# Patient Record
Sex: Female | Born: 1985
Health system: Southern US, Community
[De-identification: ages and names within clinical notes are randomized; demographics above are authoritative.]

## PROBLEM LIST (undated history)

## (undated) ENCOUNTER — Emergency Department (HOSPITAL_COMMUNITY): Admission: EM | Payer: Medicare Other

## (undated) DIAGNOSIS — B9689 Other specified bacterial agents as the cause of diseases classified elsewhere: Secondary | ICD-10-CM

## (undated) DIAGNOSIS — Z0282 Encounter for adoption services: Secondary | ICD-10-CM

## (undated) DIAGNOSIS — N760 Acute vaginitis: Secondary | ICD-10-CM

## (undated) DIAGNOSIS — F329 Major depressive disorder, single episode, unspecified: Secondary | ICD-10-CM

## (undated) DIAGNOSIS — F32A Depression, unspecified: Secondary | ICD-10-CM

## (undated) DIAGNOSIS — Z789 Other specified health status: Secondary | ICD-10-CM

## (undated) DIAGNOSIS — E669 Obesity, unspecified: Secondary | ICD-10-CM

## (undated) DIAGNOSIS — A4902 Methicillin resistant Staphylococcus aureus infection, unspecified site: Secondary | ICD-10-CM

## (undated) DIAGNOSIS — N390 Urinary tract infection, site not specified: Secondary | ICD-10-CM

## (undated) DIAGNOSIS — A749 Chlamydial infection, unspecified: Secondary | ICD-10-CM

---
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
## 2005-04-01 ENCOUNTER — Inpatient Hospital Stay (HOSPITAL_COMMUNITY): Admission: AD | Admit: 2005-04-01 | Discharge: 2005-04-04 | Payer: Self-pay | Admitting: Obstetrics

## 2005-12-23 ENCOUNTER — Emergency Department (HOSPITAL_COMMUNITY): Admission: EM | Admit: 2005-12-23 | Discharge: 2005-12-23 | Payer: Self-pay | Admitting: Emergency Medicine

## 2006-08-10 ENCOUNTER — Ambulatory Visit: Payer: Self-pay | Admitting: Internal Medicine

## 2006-08-10 ENCOUNTER — Inpatient Hospital Stay (HOSPITAL_COMMUNITY): Admission: EM | Admit: 2006-08-10 | Discharge: 2006-08-14 | Payer: Self-pay | Admitting: Emergency Medicine

## 2006-08-17 ENCOUNTER — Ambulatory Visit: Payer: Self-pay | Admitting: *Deleted

## 2006-08-24 ENCOUNTER — Emergency Department (HOSPITAL_COMMUNITY): Admission: EM | Admit: 2006-08-24 | Discharge: 2006-08-25 | Payer: Self-pay | Admitting: Emergency Medicine

## 2006-08-27 ENCOUNTER — Encounter (INDEPENDENT_AMBULATORY_CARE_PROVIDER_SITE_OTHER): Payer: Self-pay | Admitting: *Deleted

## 2006-12-05 ENCOUNTER — Ambulatory Visit: Payer: Self-pay | Admitting: Family Medicine

## 2006-12-06 ENCOUNTER — Ambulatory Visit: Payer: Self-pay | Admitting: Family Medicine

## 2007-06-05 ENCOUNTER — Emergency Department (HOSPITAL_COMMUNITY): Admission: EM | Admit: 2007-06-05 | Discharge: 2007-06-05 | Payer: Self-pay | Admitting: Emergency Medicine

## 2007-06-19 ENCOUNTER — Emergency Department (HOSPITAL_COMMUNITY): Admission: EM | Admit: 2007-06-19 | Discharge: 2007-06-19 | Payer: Self-pay | Admitting: Emergency Medicine

## 2007-11-04 ENCOUNTER — Emergency Department (HOSPITAL_COMMUNITY): Admission: EM | Admit: 2007-11-04 | Discharge: 2007-11-04 | Payer: Self-pay | Admitting: Emergency Medicine

## 2008-01-04 ENCOUNTER — Emergency Department (HOSPITAL_COMMUNITY): Admission: EM | Admit: 2008-01-04 | Discharge: 2008-01-04 | Payer: Self-pay | Admitting: Emergency Medicine

## 2008-05-13 ENCOUNTER — Emergency Department (HOSPITAL_COMMUNITY): Admission: EM | Admit: 2008-05-13 | Discharge: 2008-05-13 | Payer: Self-pay | Admitting: Emergency Medicine

## 2008-10-29 ENCOUNTER — Emergency Department (HOSPITAL_COMMUNITY): Admission: EM | Admit: 2008-10-29 | Discharge: 2008-10-29 | Payer: Self-pay | Admitting: Emergency Medicine

## 2009-06-19 ENCOUNTER — Emergency Department (HOSPITAL_COMMUNITY): Admission: EM | Admit: 2009-06-19 | Discharge: 2009-06-19 | Payer: Self-pay | Admitting: Emergency Medicine

## 2009-08-01 ENCOUNTER — Inpatient Hospital Stay (HOSPITAL_COMMUNITY): Admission: AD | Admit: 2009-08-01 | Discharge: 2009-08-02 | Payer: Self-pay | Admitting: Family Medicine

## 2009-08-01 ENCOUNTER — Ambulatory Visit: Payer: Self-pay | Admitting: Nurse Practitioner

## 2009-08-03 ENCOUNTER — Emergency Department (HOSPITAL_COMMUNITY): Admission: EM | Admit: 2009-08-03 | Discharge: 2009-08-04 | Payer: Self-pay | Admitting: Emergency Medicine

## 2009-10-15 ENCOUNTER — Inpatient Hospital Stay (HOSPITAL_COMMUNITY): Admission: AD | Admit: 2009-10-15 | Discharge: 2009-10-15 | Payer: Self-pay | Admitting: Obstetrics

## 2009-10-26 ENCOUNTER — Ambulatory Visit (HOSPITAL_COMMUNITY): Admission: RE | Admit: 2009-10-26 | Discharge: 2009-10-26 | Payer: Self-pay | Admitting: Obstetrics

## 2009-12-14 ENCOUNTER — Ambulatory Visit (HOSPITAL_COMMUNITY): Admission: RE | Admit: 2009-12-14 | Discharge: 2009-12-14 | Payer: Self-pay | Admitting: Obstetrics

## 2010-01-14 ENCOUNTER — Inpatient Hospital Stay (HOSPITAL_COMMUNITY)
Admission: AD | Admit: 2010-01-14 | Discharge: 2010-02-12 | Payer: Self-pay | Source: Home / Self Care | Attending: Obstetrics | Admitting: Obstetrics

## 2010-01-17 ENCOUNTER — Ambulatory Visit (HOSPITAL_COMMUNITY)
Admission: RE | Admit: 2010-01-17 | Discharge: 2010-01-17 | Payer: Self-pay | Source: Home / Self Care | Attending: Obstetrics | Admitting: Obstetrics

## 2010-01-26 ENCOUNTER — Ambulatory Visit (HOSPITAL_COMMUNITY): Admission: RE | Admit: 2010-01-26 | Payer: Self-pay | Source: Home / Self Care | Admitting: Obstetrics

## 2010-02-09 ENCOUNTER — Encounter: Payer: Self-pay | Admitting: Obstetrics

## 2010-02-14 LAB — CBC
HCT: 32.3 % — ABNORMAL LOW (ref 36.0–46.0)
HCT: 26.8 % — ABNORMAL LOW (ref 36.0–46.0)
Hemoglobin: 10.8 g/dL — ABNORMAL LOW (ref 12.0–15.0)
Hemoglobin: 9 g/dL — ABNORMAL LOW (ref 12.0–15.0)
MCH: 30 pg (ref 26.0–34.0)
MCH: 29.8 pg (ref 26.0–34.0)
MCHC: 33.4 g/dL (ref 30.0–36.0)
MCHC: 33.6 g/dL (ref 30.0–36.0)
MCV: 89.7 fL (ref 78.0–100.0)
MCV: 88.7 fL (ref 78.0–100.0)
Platelets: 175 10*3/uL (ref 150–400)
Platelets: 120 10*3/uL — ABNORMAL LOW (ref 150–400)
RBC: 3.6 MIL/uL — ABNORMAL LOW (ref 3.87–5.11)
RBC: 3.02 MIL/uL — ABNORMAL LOW (ref 3.87–5.11)
RDW: 15.1 % (ref 11.5–15.5)
RDW: 14.9 % (ref 11.5–15.5)
WBC: 5.9 10*3/uL (ref 4.0–10.5)
WBC: 7.5 10*3/uL (ref 4.0–10.5)

## 2010-02-14 LAB — LSPG (L/S RATIO WITH PG)-AMNIO FLUID

## 2010-02-20 ENCOUNTER — Encounter: Payer: Self-pay | Admitting: Obstetrics

## 2010-03-01 NOTE — Letter (Signed)
Summary: Letter:Court  Letter:Court   Imported By: Florinda Marker 08/30/2006 10:43:16  _____________________________________________________________________  External Attachment:    Type:   Image     Comment:   External Document

## 2010-04-11 LAB — CBC
HCT: 30.1 % — ABNORMAL LOW (ref 36.0–46.0)
HCT: 30.7 % — ABNORMAL LOW (ref 36.0–46.0)
HCT: 31.5 % — ABNORMAL LOW (ref 36.0–46.0)
Hemoglobin: 10.2 g/dL — ABNORMAL LOW (ref 12.0–15.0)
Hemoglobin: 10.2 g/dL — ABNORMAL LOW (ref 12.0–15.0)
Hemoglobin: 10.3 g/dL — ABNORMAL LOW (ref 12.0–15.0)
MCH: 29.7 pg (ref 26.0–34.0)
MCH: 30 pg (ref 26.0–34.0)
MCH: 30.1 pg (ref 26.0–34.0)
MCHC: 32.7 g/dL (ref 30.0–36.0)
MCHC: 33.2 g/dL (ref 30.0–36.0)
MCHC: 33.9 g/dL (ref 30.0–36.0)
MCV: 88.8 fL (ref 78.0–100.0)
MCV: 90.3 fL (ref 78.0–100.0)
MCV: 90.8 fL (ref 78.0–100.0)
Platelets: 136 10*3/uL — ABNORMAL LOW (ref 150–400)
Platelets: 138 10*3/uL — ABNORMAL LOW (ref 150–400)
Platelets: 177 10*3/uL (ref 150–400)
RBC: 3.39 MIL/uL — ABNORMAL LOW (ref 3.87–5.11)
RBC: 3.4 MIL/uL — ABNORMAL LOW (ref 3.87–5.11)
RBC: 3.47 MIL/uL — ABNORMAL LOW (ref 3.87–5.11)
RDW: 15.1 % (ref 11.5–15.5)
RDW: 15.9 % — ABNORMAL HIGH (ref 11.5–15.5)
RDW: 16.1 % — ABNORMAL HIGH (ref 11.5–15.5)
WBC: 8.1 10*3/uL (ref 4.0–10.5)
WBC: 8.1 10*3/uL (ref 4.0–10.5)
WBC: 9 10*3/uL (ref 4.0–10.5)

## 2010-04-11 LAB — TYPE AND SCREEN
ABO/RH(D): A POS
ABO/RH(D): A POS
ABO/RH(D): A POS
Antibody Screen: NEGATIVE
Antibody Screen: NEGATIVE
Antibody Screen: NEGATIVE

## 2010-04-11 LAB — GC/CHLAMYDIA PROBE AMP, URINE
Chlamydia, Swab/Urine, PCR: NEGATIVE
GC Probe Amp, Urine: NEGATIVE

## 2010-04-11 LAB — DIFFERENTIAL
Basophils Absolute: 0 10*3/uL (ref 0.0–0.1)
Basophils Relative: 0 % (ref 0–1)
Eosinophils Absolute: 0.1 10*3/uL (ref 0.0–0.7)
Eosinophils Relative: 1 % (ref 0–5)
Lymphocytes Relative: 16 % (ref 12–46)
Lymphs Abs: 1.4 10*3/uL (ref 0.7–4.0)
Monocytes Absolute: 0.4 10*3/uL (ref 0.1–1.0)
Monocytes Relative: 5 % (ref 3–12)
Neutro Abs: 7 10*3/uL (ref 1.7–7.7)
Neutrophils Relative %: 78 % — ABNORMAL HIGH (ref 43–77)

## 2010-04-11 LAB — DIC (DISSEMINATED INTRAVASCULAR COAGULATION)PANEL
D-Dimer, Quant: 1.12 ug/mL-FEU — ABNORMAL HIGH (ref 0.00–0.48)
Fibrinogen: 571 mg/dL — ABNORMAL HIGH (ref 204–475)
INR: 1.43 (ref 0.00–1.49)
Platelets: 138 10*3/uL — ABNORMAL LOW (ref 150–400)
Prothrombin Time: 17.6 seconds — ABNORMAL HIGH (ref 11.6–15.2)
Smear Review: NONE SEEN
aPTT: 29 seconds (ref 24–37)

## 2010-04-11 LAB — URINE CULTURE
Colony Count: 8000
Culture  Setup Time: 201112170343

## 2010-04-11 LAB — RPR: RPR Ser Ql: NONREACTIVE

## 2010-04-11 LAB — LSPG (L/S RATIO WITH PG)-AMNIO FLUID

## 2010-04-14 LAB — WET PREP, GENITAL
Clue Cells Wet Prep HPF POC: NONE SEEN
Yeast Wet Prep HPF POC: NONE SEEN

## 2010-04-14 LAB — GC/CHLAMYDIA PROBE AMP, GENITAL
Chlamydia, DNA Probe: NEGATIVE
GC Probe Amp, Genital: NEGATIVE

## 2010-04-17 LAB — URINALYSIS, ROUTINE W REFLEX MICROSCOPIC
Bilirubin Urine: NEGATIVE
Glucose, UA: NEGATIVE mg/dL
Ketones, ur: NEGATIVE mg/dL
Nitrite: NEGATIVE
Protein, ur: NEGATIVE mg/dL
Specific Gravity, Urine: 1.025 (ref 1.005–1.030)
Urobilinogen, UA: 1 mg/dL (ref 0.0–1.0)
pH: 6 (ref 5.0–8.0)

## 2010-04-17 LAB — HCG, QUANTITATIVE, PREGNANCY: hCG, Beta Chain, Quant, S: 5868 m[IU]/mL — ABNORMAL HIGH (ref <5)

## 2010-04-17 LAB — CBC
HCT: 28.9 % — ABNORMAL LOW (ref 36.0–46.0)
Hemoglobin: 9.2 g/dL — ABNORMAL LOW (ref 12.0–15.0)
MCH: 25.1 pg — ABNORMAL LOW (ref 26.0–34.0)
MCHC: 31.9 g/dL (ref 30.0–36.0)
MCV: 78.9 fL (ref 78.0–100.0)
Platelets: 299 10*3/uL (ref 150–400)
RBC: 3.66 MIL/uL — ABNORMAL LOW (ref 3.87–5.11)
RDW: 19.5 % — ABNORMAL HIGH (ref 11.5–15.5)
WBC: 6.2 10*3/uL (ref 4.0–10.5)

## 2010-04-17 LAB — URINE MICROSCOPIC-ADD ON

## 2010-04-17 LAB — GC/CHLAMYDIA PROBE AMP, GENITAL
Chlamydia, DNA Probe: NEGATIVE
GC Probe Amp, Genital: NEGATIVE

## 2010-04-17 LAB — WET PREP, GENITAL
Trich, Wet Prep: NONE SEEN
Yeast Wet Prep HPF POC: NONE SEEN

## 2010-04-17 LAB — ABO/RH: ABO/RH(D): A POS

## 2010-05-06 LAB — RPR: RPR Ser Ql: NONREACTIVE

## 2010-06-14 NOTE — Discharge Summary (Signed)
NAMEESTELLAR, CADENA             ACCOUNT NO.:  [id number]   MEDICAL RECORD NO.:  [id number]          PATIENT TYPE:  OBV   LOCATION:  5703                         FACILITY:  [facility]   PHYSICIAN:  Tacey Ruiz, MD    DATE OF BIRTH:  [birth year]   DATE OF ADMISSION:  08/09/2006  DATE OF DISCHARGE:  08/14/2006                               DISCHARGE SUMMARY   CHIEF COMPLAINT:  Asthma exacerbation.   DISCHARGE DIAGNOSES:  1. Asthma.  2. Hypokalemia.  3. Bipolar disorder.   DISCHARGE MEDICATIONS:  1. Advair 250/50, use twice q.d.  2. Celexa 10 mg, use daily at night.  3. Risperdal 0.5 mg daily at night.  4. Albuterol 0.09 mg two puffs every four hours as needed.  5. Prednisone 10 mg to take three pills July 15, take two pills July      16, take one pill July 17, then stop.   DISPOSITION/FOLLOW UP:  Patient will return to internal medicine clinic  here at [facility] to see Dr. Kerby Nora on July 29th at 3 PM.  During this appointment, we will follow-up patient's use of nebulizers  and make sure she has all her medications.  We will also deal with some  social issues including her current residence and living situation.  At  this time, we can consider scheduling Mrs. Lobo for an outpatient  pulmonary function test.  The patient will also schedule up with the  [facility], phone number [phone], July 22nd at 12:15 PM.  The  patient will follow-up here to deal with her bipolar disorder and they  will manage her Celexa and Risperdal, which were started during this  hospitalization.  Another option for follow-up includes the [facility] of the [place] so that patient can come to terms with the loss  of custody of her one-year-old son.   PROCEDURE:  None.   CULTURE:  None.   CONSULTING PHYSICIAN:  Psychiatry saw and consulted this patient due to  her history of mental illness, likely bipolar disorder.  Psychiatry felt  the patient would benefit from being started  on Celexa and Risperdal to  try to manage her presumed bipolar disorder.  They also recommended  follow-up at the [facility] and the [facility] of the [place].   HISTORY AND PHYSICAL:  This is a 25 year old African-American female  with a past medical history significant for asthma.  The patient uses  her albuterol and Advair inhalers at home.  The patient had been out of  these inhalers for four to five months.  The patient had no PCP so she  is not currently take these medications.  On the 4th of July, the  patient was in [place], [place], had some shortness of breath  and wheezing.  This persisted, but she tried some home remedies which  she thought would help.  These remedies include coffee.  One day prior  to admission, the patient experienced worsening wheezing and shortness  of breath and on the day of admission, the patient called EMS because  the shortness of breath had continued to worsen and  she was now severely  dyspneic and wheezy.  The patient denies fevers, chills or cough  productive of sputum.  The patient denies any aggravators in her place  of residence.  The patient has no history of intubations.  The patient  had been given prednisone and four albuterol nebulized treatments in the  ED without significant relief.   ALLERGIES:  NO KNOWN DRUG ALLERGIES.  THE PATIENT HAS ALLERGIES TO  PEANUTS AND SHELLFISH WHICH CAUSE HER THROAT TO SWELL AND HER TO BREAK  OUT IN HIVES.  THE SAME OCCURS WHEN THE PATIENT EATS CRAB LEGS.   PAST MEDICAL HISTORY:  Asthma since [age] months of age, managed with  albuterol and Advair.   MEDICATIONS:  None.   SOCIAL HISTORY:  The patient is a former smoker.  She smoked two years  at one to two cigarettes a day.  She drinks two to three alcoholic  drinks a week and occasionally uses marijuana.  She is single.  She does  not work.  She is on Medicaid and has Medicaid with her prescriptions.  The patient states she lives with  her mother, sister and son.   FAMILY HISTORY:  Mother is 78 years old, also has asthma and a history  of thyroid disease.  She has a father who is 35 with a history of  diabetes.  She has one brother, one sister, both have asthma; and one  child, a one-year-old son who has asthma.   REVIEW OF SYSTEMS:  Negative except as noted in HPI.   PHYSICAL EXAMINATION:  VITAL SIGNS:  Temperature of 98.9.  Blood  pressure 137/87.  Pulse of 91, but fluctuated up to 131.  Respiratory  rate ranged from 22 to 25.  The patient was satting 99% on room air.  GENERAL:  The patient was alert, in mild respiratory distress.  EYES:  Pupils are equal and evenly reactive to light.  Extraocular  movements are intact.  ENT:  Oropharynx clear.  Neck supple.  RESPIRATORY:  Poor air entry bilaterally.  Patient with expiratory  wheezes bilaterally, but no crackles.  CARDIOVASCULAR:  The patient was tachycardic with S1 and S2 and regular  rate and rhythm.  GI:  Bowel sounds are positive.  The patient was nontender, no  distention.  EXTREMITIES:  No edema.  SKIN:  Normal, no rashes.  NEUROLOGIC EXAM:  Cranial nerves II through XII are intact.  Motor,  sensory intact.  Cerebellar function intact, finger-to-nose.  PSYCH:  Patient appeared friendly, although somewhat disheveled and had  a normal affect.   LABORATORY DATA:  CBC:  White count 10.4, hemoglobin 12.9, platelets  305,000.  BMET:  Sodium 137, potassium 3.7, chloride 107, bicarb 19, BUN  7, creatinine 0.8, and glucose of 163.  Chest x-ray showed bronchitis,  but no consolidation.   HOSPITAL COURSE:  1. Asthma exacerbation:  The patient presented to the emergency      department in mild respiratory distress with bilateral wheezing.      The patient was given prednisone in the ED and albuterol nebulized      treatments and before transfer up to the floor, got some relief.      The patient was then switched over to Solu-Medrol IV steroids and      started  back on her Advair.  She was also continued on nebulized      treatments.  The next morning, with help from the treatments the      wheezing had  all but resolved.  The patient said she felt like she      was back to her normal respiratory state.  The patient was      restarted on her Advair b.i.d. and nebulizers were stopped.  The      patient was placed back on her albuterol q.4h. p.r.n.  Solu-Medrol      was stopped as well and the patient was started on a 60-mg steroid      oral prednisone taper.  The patient will need to follow-up with a      PCP to ensure that she has her Advair and albuterol as an      outpatient.  She will follow-up here in the internal medicine      clinic.  Also the patient could consider having pulmonary function      tests as an outpatient to establish a baseline.  2. Tachycardia:  The patient was tachycardic in the emergency      department.  EKG was done which showed sinus tachycardia.  This      tachycardia could be related to the patient's anxiety and      respiratory distress, also could be related to albuterol use.      Tachycardia resolved during the course of hospital stay without      treatment.  3. Hypokalemia:  Patient initially presented with a potassium of 3.7.      It dropped to 3.4 and was repleted.  This potassium drop is      attributed to the patient's use of beta-2 agonists.  The patient      had a magnesium level checked which was normal at 2.2.  4. Bipolar disorder:  The patient was being prepared for discharge on      August 10, 2006 when her mother and sister were present.  Upon      exiting the room, we were informed by the patient's mother that she      is in fact homeless and has a history of mental illness including      possible bipolar disorder.  The patient also recently lost custody      of her one-year-old son due to these mental issues and      homelessness.  At this point, psychiatry was consulted to further      assess the  patient's mental illness.  Social work was consulted to      try and help find the patient outpatient placement.  Psychiatry saw      the patient and recommended the patient be started on Risperdal and      Celexa.  Also follow-up with the [facility].      They recommended the [facility] of the [place] as follow-up to      try to deal with the patient's recent loss of her one-year-old son.      Social work found placement in a [employer].      A urine drug screen was obtained during admission and all of which      were negative.  5. Leukocytosis:  The patient had an elevated white count of 14.9.      This was attributed to her steroid use.  The patient remained      afebrile and had no other signs and symptoms of infection.   LABORATORY DATA:  Discharge labs and vitals:  Temperature 98.4.  Blood  pressure 113/68.  Pulse 93.  Respiratory rate 20.  Satting 97% on room  air.  Labs:  CBC - White count 14.9, hemoglobin 12.2 and platelets  298,000.  BMET - Sodium 139, potassium 4.0, chloride 108, glucose 100,  BUN 10, creatinine 0.82.      Tacey Ruiz, MD  Electronically Signed     JP/MEDQ  D:  08/13/2006  T:  08/14/2006  Job:  [id number]

## 2010-06-17 NOTE — Discharge Summary (Signed)
Jill Summers, Jill Summers             ACCOUNT NO.:  [id number]   MEDICAL RECORD NO.:  [id number]          PATIENT TYPE:  INP   LOCATION:  9103                          [facility]   PHYSICIAN:  Kathreen Cosier, M.D.DATE OF BIRTH:  03-Dec-1985   DATE OF ADMISSION:  04/01/2005  DATE OF DISCHARGE:  04/04/2005                                 DISCHARGE SUMMARY   The patient is a 25 year old primigravida, [name] March 31, 2005, admitted at  term for induction at patient's request.  She was 1 cm, 60%, vertex, -3.  The patient underwent primary low transverse cesarean section because of  failure to progress in labor.  She had a 6 pound 11 ounce female, Apgars 9 and  10 from the OP position.  Postop she did well.  On admission, her hemoglobin  was 11.4, postop 9.9.  Platelets 194, 161.  A positive, RPR negative. The  urine was negative.   She was discharged home on the third postoperative day and on a regular  diet.  She is to see me in 6 weeks.   DISCHARGE DIAGNOSIS:  Status post primary low transverse cesarean section at  term.           ______________________________  Kathreen Cosier, M.D.     BAM/MEDQ  D:  05/24/2005  T:  05/24/2005  Job:  [id number]

## 2010-06-17 NOTE — H&P (Signed)
Jill Summers             ACCOUNT NO.:  [id number]   MEDICAL RECORD NO.:  [id number]          PATIENT TYPE:  INP   LOCATION:  9103                          [facility]  [facility]   PHYSICIAN:  Kathreen Cosier, M.D.DATE OF BIRTH:  15-Feb-1985   DATE OF ADMISSION:  04/01/2005  DATE OF DISCHARGE:                                HISTORY & PHYSICAL   HISTORY OF PRESENT ILLNESS:  The patient is a 25 year old primigravida, [facility]  March 31, 2005 and was admitted for induction on April 01, 2005 at the  patient's request.  Cervix 1 cm, 60%, vertex, -3.  Membranes were ruptured  artificially, fluid was clear.  GBS was negative.  The patient was started  on low dose Pitocin and progressed slowly.  At 12:51 p.m. she was 2, 70%,  vertex, -2 to -3 station.  IUPC was inserted at that time and she had an  epidural.  At 3 p.m. she was 3-4 cm, 90% in good labor.  Vertex __________-1  to  -2 station and by 10 p.m. she had not changed.  It was decided she would  deliver by C-section for failure to progress in labor.   PHYSICAL EXAMINATION:  GENERAL:  Revealed a well-developed female in labor.  HEENT:  Negative.  LUNGS:  Clear.  HEART:  Regular rhythm.  No murmurs, no gallops.  BREASTS:  No masses.  ABDOMEN:  Term.  Estimated fetal weight was 7 pounds 12 ounces.  PELVIC:  As described above.  EXTREMITIES:  Negative.           ______________________________  Kathreen Cosier, M.D.     BAM/MEDQ  D:  04/01/2005  T:  04/02/2005  Job:  [id number]

## 2010-06-17 NOTE — Op Note (Signed)
Jill Summers, Jill Summers             ACCOUNT NO.:  [id number]   MEDICAL RECORD NO.:  [id number]          PATIENT TYPE:  INP   LOCATION:  9166                          [facility]  [facility]   PHYSICIAN:  Kathreen Cosier, M.D.DATE OF BIRTH:  02-Nov-1985   DATE OF PROCEDURE:  04/01/2005  DATE OF DISCHARGE:                                 OPERATIVE REPORT   PREOPERATIVE DIAGNOSES:  1.  Failure to progress in labor.  2.  Failed induction.   SURGEON:  Kathreen Cosier, M.D.   ANESTHESIA:  Epidural.   DESCRIPTION OF PROCEDURE:  The patient placed on the operating table in the  supine position.  Abdomen prepped and draped.  Bladder emptied with Foley  catheter.  Transverse suprapubic incision made, carried down through the  rectus fascia.  The fascia was cleaned and incised the length of the  incision.  Rectus muscle were retracted laterally.  The peritoneum was  incised longitudinally.  Transverse incision made in the vesicouterine  peritoneum above the bladder.  The bladder mobilized inferiorly.  Transverse  lower uterine incision made.  The fluid was clear.  The patient was  delivered from the OP position of a female. Apgars 9 and 10, weighing 6 pounds  11 ounces.  The team was in attendance.  Placenta was posterior and removed  manually and sent to labor and delivery.  Uterine cavity was cleaned with  dry laps.  Uterine incision closed in one layer with continuous suture of #1  chromic.  Hemostasis was satisfactory.  Bladder flap reattached with 2-0  chromic.  Uterus was well contracted.  Tubes and ovaries normal.  Abdomen  closed in layers.  Peritoneum with continuous suture of 0 chromic, fascia  with continuous suture of 0 Dexon and skin closed with subcuticular stitch  of 4-0 Monocryl.  Blood loss 500 mL.  The patient tolerated the procedure  well, taken to the recovery room in good condition.           ______________________________  Kathreen Cosier, M.D.     BAM/MEDQ  D:   04/01/2005  T:  04/03/2005  Job:  [id number]

## 2010-08-01 ENCOUNTER — Inpatient Hospital Stay (HOSPITAL_COMMUNITY)
Admission: AD | Admit: 2010-08-01 | Discharge: 2010-08-01 | Disposition: A | Discharge status: Home / Self Care | Payer: Medicaid Other | Source: Ambulatory Visit | Attending: Obstetrics & Gynecology | Admitting: Obstetrics & Gynecology

## 2010-08-01 DIAGNOSIS — N938 Other specified abnormal uterine and vaginal bleeding: Secondary | ICD-10-CM | POA: Insufficient documentation

## 2010-08-01 DIAGNOSIS — N949 Unspecified condition associated with female genital organs and menstrual cycle: Secondary | ICD-10-CM

## 2010-08-01 DIAGNOSIS — A5901 Trichomonal vulvovaginitis: Secondary | ICD-10-CM

## 2010-08-01 DIAGNOSIS — N925 Other specified irregular menstruation: Secondary | ICD-10-CM

## 2010-08-01 LAB — URINALYSIS, ROUTINE W REFLEX MICROSCOPIC
Bilirubin Urine: NEGATIVE
Glucose, UA: NEGATIVE mg/dL
Ketones, ur: NEGATIVE mg/dL
Nitrite: NEGATIVE
Protein, ur: NEGATIVE mg/dL
Specific Gravity, Urine: 1.025 (ref 1.005–1.030)
Urobilinogen, UA: 1 mg/dL (ref 0.0–1.0)
pH: 6 (ref 5.0–8.0)

## 2010-08-01 LAB — URINE MICROSCOPIC-ADD ON

## 2010-08-01 LAB — POCT PREGNANCY, URINE: Preg Test, Ur: NEGATIVE

## 2010-08-01 LAB — WET PREP, GENITAL
Trich, Wet Prep: NONE SEEN
Yeast Wet Prep HPF POC: NONE SEEN

## 2010-08-02 LAB — GC/CHLAMYDIA PROBE AMP, GENITAL
Chlamydia, DNA Probe: NEGATIVE
GC Probe Amp, Genital: NEGATIVE

## 2010-08-17 ENCOUNTER — Emergency Department (HOSPITAL_COMMUNITY)
Admission: EM | Admit: 2010-08-17 | Discharge: 2010-08-18 | Disposition: A | Discharge status: Home / Self Care | Payer: Medicaid Other | Attending: Emergency Medicine | Admitting: Emergency Medicine

## 2010-08-17 DIAGNOSIS — N390 Urinary tract infection, site not specified: Secondary | ICD-10-CM | POA: Insufficient documentation

## 2010-08-17 DIAGNOSIS — N898 Other specified noninflammatory disorders of vagina: Secondary | ICD-10-CM | POA: Insufficient documentation

## 2010-08-18 LAB — URINALYSIS, ROUTINE W REFLEX MICROSCOPIC
Bilirubin Urine: NEGATIVE
Glucose, UA: NEGATIVE mg/dL
Ketones, ur: NEGATIVE mg/dL
Nitrite: POSITIVE — AB
Protein, ur: 30 mg/dL — AB
Specific Gravity, Urine: 1.027 (ref 1.005–1.030)
Urobilinogen, UA: 1 mg/dL (ref 0.0–1.0)
pH: 6 (ref 5.0–8.0)

## 2010-08-18 LAB — WET PREP, GENITAL
Trich, Wet Prep: NONE SEEN
Yeast Wet Prep HPF POC: NONE SEEN

## 2010-08-18 LAB — URINE MICROSCOPIC-ADD ON

## 2010-08-18 LAB — POCT PREGNANCY, URINE: Preg Test, Ur: NEGATIVE

## 2010-08-19 LAB — GC/CHLAMYDIA PROBE AMP, GENITAL
Chlamydia, DNA Probe: NEGATIVE
GC Probe Amp, Genital: NEGATIVE

## 2010-10-15 ENCOUNTER — Inpatient Hospital Stay (HOSPITAL_COMMUNITY)
Admission: AD | Admit: 2010-10-15 | Discharge: 2010-10-15 | Disposition: A | Discharge status: Home Health | Payer: Medicaid Other | Source: Ambulatory Visit | Attending: Obstetrics | Admitting: Obstetrics

## 2010-10-15 ENCOUNTER — Encounter (HOSPITAL_COMMUNITY): Payer: Self-pay

## 2010-10-15 DIAGNOSIS — A499 Bacterial infection, unspecified: Secondary | ICD-10-CM

## 2010-10-15 DIAGNOSIS — Z202 Contact with and (suspected) exposure to infections with a predominantly sexual mode of transmission: Secondary | ICD-10-CM | POA: Insufficient documentation

## 2010-10-15 DIAGNOSIS — N760 Acute vaginitis: Secondary | ICD-10-CM | POA: Insufficient documentation

## 2010-10-15 DIAGNOSIS — B9689 Other specified bacterial agents as the cause of diseases classified elsewhere: Secondary | ICD-10-CM | POA: Insufficient documentation

## 2010-10-15 LAB — WET PREP, GENITAL
Trich, Wet Prep: NONE SEEN
Yeast Wet Prep HPF POC: NONE SEEN

## 2010-10-15 LAB — URINALYSIS, ROUTINE W REFLEX MICROSCOPIC
Bilirubin Urine: NEGATIVE
Glucose, UA: NEGATIVE mg/dL
Ketones, ur: 15 mg/dL — AB
Leukocytes, UA: NEGATIVE
Nitrite: NEGATIVE
Protein, ur: NEGATIVE mg/dL
Specific Gravity, Urine: >1.03 — ABNORMAL HIGH (ref 1.005–1.030)
Urobilinogen, UA: 1 mg/dL (ref 0.0–1.0)
pH: 6 (ref 5.0–8.0)

## 2010-10-15 LAB — URINE MICROSCOPIC-ADD ON

## 2010-10-15 MED ORDER — METRONIDAZOLE 500 MG PO TABS: 500.0000 mg | ORAL_TABLET | Freq: Two times a day (BID) | ORAL | Status: AC

## 2010-10-15 NOTE — ED Provider Notes (Signed)
History     Chief Complaint  Patient presents with  . Vaginal Discharge   HPICandace L [name]25 y.o.presents today very upset because the father of her baby told her today that he was dx with trichomonas.  Reports a vaginal discharge with odor X 2 weeks.  LMP 8/29.  Has had BTL.     Past Medical History  Diagnosis Date  . No pertinent past medical history     Past Surgical History  Procedure Date  . No past surgeries     No family history on file.  History  Substance Use Topics  . Smoking status: Not on file  . Smokeless tobacco: Not on file  . Alcohol Use:     Allergies: Allergies not on file  No prescriptions prior to admission    Review of Systems  Gastrointestinal: Negative for abdominal pain.  Genitourinary: Positive for frequency.       Vaginal discharge with odor X 2 weeks   Physical Exam   Blood pressure 131/79, pulse 93, temperature 98.3 F (36.8 C), temperature source Oral, resp. rate 16, height 5\' 2"  (1.575 m), weight 231 lb 12.8 oz (105.144 kg), last menstrual period 09/28/2010.  Physical Exam  Constitutional: She is oriented to person, place, and time. She appears well-developed and well-nourished.  HENT:  Head: Normocephalic.  Neck: Normal range of motion.  Respiratory: Effort normal.  GI: Soft. There is no tenderness. There is no rebound and no guarding.  Genitourinary: Uterus normal. Uterus is not tender. Cervix exhibits friability. Cervix exhibits no motion tenderness and no discharge. Right adnexum displays no tenderness. Left adnexum displays no tenderness. Vaginal discharge (malodorous) found.  Neurological: She is alert and oriented to person, place, and time.  Skin: Skin is warm and dry.      Results for orders placed during the hospital encounter of 10/15/10 (from the past 24 hour(s))  WET PREP, GENITAL     Status: Abnormal   Collection Time   10/15/10  1:30 PM      Component Value Range   Yeast, Wet Prep NONE SEEN  NONE SEEN    Trich, Wet Prep NONE SEEN  NONE SEEN    Clue Cells, Wet Prep MODERATE (*) NONE SEEN    WBC, Wet Prep HPF POC FEW (*) NONE SEEN   URINALYSIS, ROUTINE W REFLEX MICROSCOPIC     Status: Abnormal   Collection Time   10/15/10  1:40 PM      Component Value Range   Color, Urine YELLOW  YELLOW    Appearance CLEAR  CLEAR    Specific Gravity, Urine >1.030 (*) 1.005 - 1.030    pH 6.0  5.0 - 8.0    Glucose, UA NEGATIVE  NEGATIVE (mg/dL)   Hgb urine dipstick SMALL (*) NEGATIVE    Bilirubin Urine NEGATIVE  NEGATIVE    Ketones, ur 15 (*) NEGATIVE (mg/dL)   Protein, ur NEGATIVE  NEGATIVE (mg/dL)   Urobilinogen, UA 1.0  0.0 - 1.0 (mg/dL)   Nitrite NEGATIVE  NEGATIVE    Leukocytes, UA NEGATIVE  NEGATIVE   URINE MICROSCOPIC-ADD ON     Status: Normal   Collection Time   10/15/10  1:40 PM      Component Value Range   Squamous Epithelial / LPF RARE  RARE    RBC / HPF 0-2  <3 (RBC/hpf)     MAU Course  Procedures GC/Chl culture to lab.    Assessment and Plan  Assessment : Bacterial Vaginosis  Exposure to trichomonas    Plan:  Rx for Flagyl to pharmacy           Instructed to avoid alcohol, no intercourse while taking medication.  Use condoms always.          To call Dr. Verdell Carmine office for culture results on Tuesday.  KEY,EVE M 10/15/2010, 1:25 PM   Matt Holmes, NP 10/15/10 1420

## 2010-10-15 NOTE — Progress Notes (Signed)
Patient's father of her baby has trich, vaginal discharge with odor, urinary frequency x 1 to 2 weeks.

## 2010-10-17 LAB — GC/CHLAMYDIA PROBE AMP, GENITAL
Chlamydia, DNA Probe: NEGATIVE
GC Probe Amp, Genital: NEGATIVE

## 2010-11-04 LAB — PREGNANCY, URINE: Preg Test, Ur: NEGATIVE

## 2010-11-04 LAB — POCT I-STAT, CHEM 8
BUN: 10 mg/dL (ref 6–23)
Calcium, Ion: 1.13 mmol/L (ref 1.12–1.32)
Chloride: 110 mEq/L (ref 96–112)
Creatinine, Ser: 0.5 mg/dL (ref 0.4–1.2)
Glucose, Bld: 171 mg/dL — ABNORMAL HIGH (ref 70–99)
HCT: 37 % (ref 36.0–46.0)
Hemoglobin: 12.6 g/dL (ref 12.0–15.0)
Potassium: 3.8 mEq/L (ref 3.5–5.1)
Sodium: 140 mEq/L (ref 135–145)
TCO2: 19 mmol/L (ref 0–100)

## 2010-11-11 ENCOUNTER — Encounter (HOSPITAL_COMMUNITY): Payer: Self-pay | Admitting: *Deleted

## 2010-11-11 ENCOUNTER — Inpatient Hospital Stay (HOSPITAL_COMMUNITY)
Admission: AD | Admit: 2010-11-11 | Discharge: 2010-11-11 | Disposition: A | Discharge status: Home / Self Care | Payer: Medicaid Other | Source: Ambulatory Visit | Attending: Obstetrics and Gynecology | Admitting: Obstetrics and Gynecology

## 2010-11-11 DIAGNOSIS — A499 Bacterial infection, unspecified: Secondary | ICD-10-CM

## 2010-11-11 DIAGNOSIS — N760 Acute vaginitis: Secondary | ICD-10-CM | POA: Insufficient documentation

## 2010-11-11 DIAGNOSIS — B9689 Other specified bacterial agents as the cause of diseases classified elsewhere: Secondary | ICD-10-CM | POA: Insufficient documentation

## 2010-11-11 DIAGNOSIS — N949 Unspecified condition associated with female genital organs and menstrual cycle: Secondary | ICD-10-CM | POA: Insufficient documentation

## 2010-11-11 LAB — WET PREP, GENITAL
Trich, Wet Prep: NONE SEEN
Yeast Wet Prep HPF POC: NONE SEEN

## 2010-11-11 MED ORDER — METRONIDAZOLE 500 MG PO TABS: 500.0000 mg | ORAL_TABLET | Freq: Two times a day (BID) | ORAL | Status: AC

## 2010-11-11 NOTE — ED Provider Notes (Signed)
History   Pt presents today c/o vag irritation. She states she was recently treated for trichomonas but did not take all of her medication. She denies abd pain, fever, or any other sx at this time. She desires STD testing.  No chief complaint on file.  HPI  OB History    Grav Para Term Preterm Abortions TAB SAB Ect Mult Living            1      Past Medical History  Diagnosis Date  . No pertinent past medical history     Past Surgical History  Procedure Date  . No past surgeries     History reviewed. No pertinent family history.  History  Substance Use Topics  . Smoking status: Not on file  . Smokeless tobacco: Not on file  . Alcohol Use:     Allergies: No Known Allergies  Prescriptions prior to admission  Medication Sig Dispense Refill  . cetirizine (ZYRTEC) 10 MG tablet Take 10 mg by mouth daily.        . hydrocortisone 1 % cream Apply 1 application topically 2 (two) times daily. For rash.       Maximino Greenland IN Inhale 2 puffs into the lungs daily as needed. For shortness of breath.         Review of Systems  Constitutional: Negative for fever.  Cardiovascular: Negative for chest pain.  Gastrointestinal: Negative for nausea, vomiting, abdominal pain, diarrhea and constipation.  Genitourinary: Negative for dysuria, urgency, frequency and hematuria.  Neurological: Negative for dizziness and headaches.  Psychiatric/Behavioral: Negative for depression and suicidal ideas.   Physical Exam   Blood pressure 128/68, pulse 81, temperature 97.1 F (36.2 C), temperature source Oral, resp. rate 18, height 5\' 4"  (1.626 m), weight 234 lb 6.4 oz (106.323 kg), last menstrual period 10/18/2010, SpO2 99.00%.  Physical Exam  Constitutional: She is oriented to person, place, and time. She appears well-developed and well-nourished. No distress.  HENT:  Head: Normocephalic and atraumatic.  GI: Soft. She exhibits no distension. There is no tenderness. There is no rebound  and no guarding.  Genitourinary: There is bleeding around the vagina. Vaginal discharge found.       Cervix Lg/closed.  Neurological: She is alert and oriented to person, place, and time.  Skin: Skin is warm and dry. She is not diaphoretic.  Psychiatric: She has a normal mood and affect. Her behavior is normal. Judgment and thought content normal.    MAU Course  Procedures  Wet prep and GC/Chlamydia cultures done. Results for orders placed during the hospital encounter of 11/11/10 (from the past 24 hour(s))  WET PREP, GENITAL     Status: Abnormal   Collection Time   11/11/10  4:05 PM      Component Value Range   Yeast, Wet Prep NONE SEEN  NONE SEEN    Trich, Wet Prep NONE SEEN  NONE SEEN    Clue Cells, Wet Prep FEW (*) NONE SEEN    WBC, Wet Prep HPF POC MODERATE (*) NONE SEEN      Assessment and Plan  BV: discussed with pt at length. Will tx with Flagyl. Warned of antabuse reaction. Discussed diet, activity, risks, and precautions.  Clinton Gallant. Alayzha An III, DrHSc, MPAS, PA-C  11/11/2010, 4:12 PM   Henrietta Hoover, PA 11/11/10 1643

## 2010-11-11 NOTE — Progress Notes (Signed)
Pt states she was treated for trich a few weeks ago and thinks she did not take all her medication. Has a vagina irritation, no discharge.

## 2010-11-13 NOTE — ED Provider Notes (Signed)
Agree with above note.  Jill Summers 11/13/2010 7:53 AM

## 2010-11-14 LAB — GC/CHLAMYDIA PROBE AMP, GENITAL
Chlamydia, DNA Probe: POSITIVE — AB
GC Probe Amp, Genital: NEGATIVE

## 2010-11-14 LAB — D-DIMER, QUANTITATIVE: D-Dimer, Quant: 1.04 — ABNORMAL HIGH

## 2010-11-15 LAB — POCT I-STAT 3, ART BLOOD GAS (G3+)
Acid-base deficit: 6 — ABNORMAL HIGH
Bicarbonate: 19.1 — ABNORMAL LOW
O2 Saturation: 96
Operator id: 280981
TCO2: 20
pCO2 arterial: 34 — ABNORMAL LOW
pH, Arterial: 7.357
pO2, Arterial: 87

## 2010-11-15 LAB — BASIC METABOLIC PANEL
BUN: 10
BUN: 11
BUN: 7
CO2: 19
CO2: 24
CO2: 25
Calcium: 8.5
Calcium: 8.6
Calcium: 9.1
Chloride: 106
Chloride: 107
Chloride: 108
Creatinine, Ser: 0.71
Creatinine, Ser: 0.82
Creatinine, Ser: 0.83
GFR calc Af Amer: >60
GFR calc Af Amer: >60
GFR calc Af Amer: >60
GFR calc non Af Amer: >60
GFR calc non Af Amer: >60
GFR calc non Af Amer: >60
Glucose, Bld: 100 — ABNORMAL HIGH
Glucose, Bld: 163 — ABNORMAL HIGH
Glucose, Bld: 96
Potassium: 3.4 — ABNORMAL LOW
Potassium: 3.7
Potassium: 4
Sodium: 135
Sodium: 137
Sodium: 139

## 2010-11-15 LAB — DIFFERENTIAL
Basophils Absolute: 0
Basophils Relative: 0
Eosinophils Absolute: 0
Eosinophils Relative: 0
Lymphocytes Relative: 6 — ABNORMAL LOW
Lymphs Abs: 0.6 — ABNORMAL LOW
Monocytes Absolute: 0.1 — ABNORMAL LOW
Monocytes Relative: 1 — ABNORMAL LOW
Neutro Abs: 9.7 — ABNORMAL HIGH
Neutrophils Relative %: 93 — ABNORMAL HIGH

## 2010-11-15 LAB — CBC
HCT: 36.8
HCT: 37.1
HCT: 38.1
Hemoglobin: 12.2
Hemoglobin: 12.4
Hemoglobin: 12.9
MCHC: 32.9
MCHC: 33.7
MCHC: 33.8
MCV: 87
MCV: 87.2
MCV: 88.9
Platelets: 298
Platelets: 303
Platelets: 305
RBC: 4.17
RBC: 4.22
RBC: 4.38
RDW: 14.6 — ABNORMAL HIGH
RDW: 14.8 — ABNORMAL HIGH
RDW: 14.8 — ABNORMAL HIGH
WBC: 10.4
WBC: 14.9 — ABNORMAL HIGH
WBC: 14.9 — ABNORMAL HIGH

## 2010-11-15 LAB — TSH: TSH: 2.79

## 2010-11-15 LAB — MAGNESIUM: Magnesium: 2.2

## 2010-11-15 LAB — RAPID URINE DRUG SCREEN, HOSP PERFORMED
Amphetamines: NOT DETECTED
Barbiturates: NOT DETECTED
Benzodiazepines: NOT DETECTED
Cocaine: NOT DETECTED
Opiates: NOT DETECTED
Tetrahydrocannabinol: NOT DETECTED

## 2011-03-14 ENCOUNTER — Inpatient Hospital Stay (HOSPITAL_COMMUNITY)
Admission: AD | Admit: 2011-03-14 | Discharge: 2011-03-14 | Disposition: A | Discharge status: Home / Self Care | Payer: Medicaid Other | Source: Ambulatory Visit | Attending: Obstetrics | Admitting: Obstetrics

## 2011-03-14 ENCOUNTER — Encounter (HOSPITAL_COMMUNITY): Payer: Self-pay | Admitting: *Deleted

## 2011-03-14 DIAGNOSIS — N760 Acute vaginitis: Secondary | ICD-10-CM

## 2011-03-14 DIAGNOSIS — O2390 Unspecified genitourinary tract infection in pregnancy, unspecified trimester: Secondary | ICD-10-CM | POA: Insufficient documentation

## 2011-03-14 DIAGNOSIS — N390 Urinary tract infection, site not specified: Secondary | ICD-10-CM | POA: Insufficient documentation

## 2011-03-14 DIAGNOSIS — B9689 Other specified bacterial agents as the cause of diseases classified elsewhere: Secondary | ICD-10-CM

## 2011-03-14 DIAGNOSIS — A499 Bacterial infection, unspecified: Secondary | ICD-10-CM

## 2011-03-14 LAB — URINALYSIS, ROUTINE W REFLEX MICROSCOPIC
Bilirubin Urine: NEGATIVE
Glucose, UA: NEGATIVE mg/dL
Ketones, ur: NEGATIVE mg/dL
Nitrite: NEGATIVE
Protein, ur: NEGATIVE mg/dL
Specific Gravity, Urine: >1.03 — ABNORMAL HIGH (ref 1.005–1.030)
Urobilinogen, UA: 0.2 mg/dL (ref 0.0–1.0)
pH: 5.5 (ref 5.0–8.0)

## 2011-03-14 LAB — WET PREP, GENITAL
Trich, Wet Prep: NONE SEEN
Yeast Wet Prep HPF POC: NONE SEEN

## 2011-03-14 LAB — URINE MICROSCOPIC-ADD ON

## 2011-03-14 LAB — POCT PREGNANCY, URINE: Preg Test, Ur: NEGATIVE

## 2011-03-14 MED ORDER — METRONIDAZOLE 500 MG PO TABS: 500.0000 mg | ORAL_TABLET | Freq: Two times a day (BID) | ORAL | Status: AC

## 2011-03-14 MED ORDER — CIPROFLOXACIN HCL 500 MG PO TABS: 500.0000 mg | ORAL_TABLET | Freq: Two times a day (BID) | ORAL | Status: AC

## 2011-03-14 NOTE — Progress Notes (Signed)
Pt LMP   02/25/2011, having urinary frequency, concerned she might have an STD.  Denies discharge.

## 2011-03-14 NOTE — Progress Notes (Signed)
Pt complains of frequent urination, pt states the amount of urine varies from what she has had to drink

## 2011-03-14 NOTE — ED Provider Notes (Signed)
History     Chief Complaint  Patient presents with  . Urinary Frequency   HPI 26 y.o. [id number] with urinary frequency, no pain, no vaginal discharge. Requesting STD testing.    Past Medical History  Diagnosis Date  . Asthma     Past Surgical History  Procedure Date  . Cesarean section     Family History  Problem Relation Age of Onset  . Hypertension Mother   . Diabetes Father     History  Substance Use Topics  . Smoking status: Never Smoker   . Smokeless tobacco: Former Neurosurgeon    Quit date: 03/14/2011  . Alcohol Use: 1.0 oz/week    2 drink(s) per week    Allergies:  Allergies  Allergen Reactions  . Peanut-Containing Drug Products Hives and Swelling    *Throat swelling*  . Shellfish Allergy Hives and Swelling    Throat swelling    No prescriptions prior to admission    Review of Systems  Constitutional: Negative.   Respiratory: Negative.   Cardiovascular: Negative.   Gastrointestinal: Negative for nausea, vomiting, abdominal pain, diarrhea and constipation.  Genitourinary: Positive for frequency. Negative for dysuria, urgency, hematuria and flank pain.       Negative for vaginal bleeding, vaginal discharge, dyspareunia  Musculoskeletal: Negative.   Neurological: Negative.   Psychiatric/Behavioral: Negative.    Physical Exam   Blood pressure 119/78, pulse 102, temperature 97 F (36.1 C), resp. rate 16, height 5\' 2"  (1.575 m), weight 230 lb 6.4 oz (104.509 kg), last menstrual period 02/25/2011.  Physical Exam  Nursing note and vitals reviewed. Constitutional: She is oriented to person, place, and time. She appears well-developed and well-nourished. No distress.  HENT:  Head: Normocephalic and atraumatic.  Cardiovascular: Normal rate, regular rhythm and normal heart sounds.   Respiratory: Effort normal and breath sounds normal. No respiratory distress.  GI: Soft. Bowel sounds are normal. She exhibits no distension and no mass. There is no tenderness.  There is no rebound and no guarding.  Genitourinary: There is no rash or lesion on the right labia. There is no rash or lesion on the left labia. Uterus is not deviated, not enlarged, not fixed and not tender. Cervix exhibits no motion tenderness, no discharge and no friability. Right adnexum displays no mass, no tenderness and no fullness. Left adnexum displays no mass, no tenderness and no fullness. No erythema, tenderness or bleeding around the vagina. Vaginal discharge (white, + odor) found.  Neurological: She is alert and oriented to person, place, and time.  Skin: Skin is warm and dry.  Psychiatric: She has a normal mood and affect.    MAU Course  Procedures  Results for orders placed during the hospital encounter of 03/14/11 (from the past 24 hour(s))  URINALYSIS, ROUTINE W REFLEX MICROSCOPIC     Status: Abnormal   Collection Time   03/14/11  9:00 PM      Component Value Range   Color, Urine YELLOW  YELLOW    APPearance CLEAR  CLEAR    Specific Gravity, Urine >1.030 (*) 1.005 - 1.030    pH 5.5  5.0 - 8.0    Glucose, UA NEGATIVE  NEGATIVE (mg/dL)   Hgb urine dipstick SMALL (*) NEGATIVE    Bilirubin Urine NEGATIVE  NEGATIVE    Ketones, ur NEGATIVE  NEGATIVE (mg/dL)   Protein, ur NEGATIVE  NEGATIVE (mg/dL)   Urobilinogen, UA 0.2  0.0 - 1.0 (mg/dL)   Nitrite NEGATIVE  NEGATIVE    Leukocytes, UA TRACE (*)  NEGATIVE   URINE MICROSCOPIC-ADD ON     Status: Abnormal   Collection Time   03/14/11  9:00 PM      Component Value Range   Squamous Epithelial / LPF MANY (*) RARE    WBC, UA 7-10  <3 (WBC/hpf)   Bacteria, UA RARE  RARE    Urine-Other MUCOUS PRESENT    POCT PREGNANCY, URINE     Status: Normal   Collection Time   03/14/11  9:27 PM      Component Value Range   Preg Test, Ur NEGATIVE  NEGATIVE   WET PREP, GENITAL     Status: Abnormal   Collection Time   03/14/11  9:32 PM      Component Value Range   Yeast Wet Prep HPF POC NONE SEEN  NONE SEEN    Trich, Wet Prep NONE SEEN   NONE SEEN    Clue Cells Wet Prep HPF POC FEW (*) NONE SEEN    WBC, Wet Prep HPF POC FEW (*) NONE SEEN      Assessment and Plan  26 y.o. [id number] UTI - rx cipro Flagyl - rx flagyl F/U PRN  Wilford Merryfield 03/14/2011, 10:37 PM

## 2011-03-14 NOTE — Progress Notes (Signed)
Speculum exam done. Cultures obtained.

## 2011-03-15 LAB — GC/CHLAMYDIA PROBE AMP, GENITAL
Chlamydia, DNA Probe: NEGATIVE
GC Probe Amp, Genital: NEGATIVE

## 2011-04-04 ENCOUNTER — Encounter (HOSPITAL_COMMUNITY): Payer: Self-pay | Admitting: *Deleted

## 2011-04-04 ENCOUNTER — Inpatient Hospital Stay (HOSPITAL_COMMUNITY)
Admission: AD | Admit: 2011-04-04 | Discharge: 2011-04-04 | Disposition: A | Discharge status: Home / Self Care | Payer: Medicaid Other | Source: Ambulatory Visit | Attending: Obstetrics | Admitting: Obstetrics

## 2011-04-04 DIAGNOSIS — L0233 Carbuncle of buttock: Secondary | ICD-10-CM | POA: Insufficient documentation

## 2011-04-04 DIAGNOSIS — L0232 Furuncle of buttock: Secondary | ICD-10-CM

## 2011-04-04 DIAGNOSIS — L02429 Furuncle of limb, unspecified: Secondary | ICD-10-CM

## 2011-04-04 MED ORDER — OXYCODONE-ACETAMINOPHEN 5-325 MG PO TABS: 1.0000 | ORAL_TABLET | Freq: Four times a day (QID) | ORAL | Status: AC | PRN

## 2011-04-04 MED ORDER — OXYCODONE-ACETAMINOPHEN 5-325 MG PO TABS
1.0000 | ORAL_TABLET | Freq: Once | ORAL | Status: AC
  Administered 2011-04-04: 1 via ORAL
  Filled 2011-04-04: qty 1

## 2011-04-04 MED ORDER — SULFAMETHOXAZOLE-TMP DS 800-160 MG PO TABS: 1.0000 | ORAL_TABLET | Freq: Two times a day (BID) | ORAL | Status: AC

## 2011-04-04 NOTE — ED Provider Notes (Signed)
History     No chief complaint on file.  HPI Boil on right inner thigh, painful, x 2 days, also under left buttocks. Had another on the right side under buttocks, states that boyfriend "popped it" last night.    Past Medical History  Diagnosis Date  . Asthma     Past Surgical History  Procedure Date  . Cesarean section     Family History  Problem Relation Age of Onset  . Hypertension Mother   . Diabetes Father     History  Substance Use Topics  . Smoking status: Never Smoker   . Smokeless tobacco: Former Neurosurgeon    Quit date: 03/14/2011  . Alcohol Use: 1.0 oz/week    2 drink(s) per week    Allergies:  Allergies  Allergen Reactions  . Peanut-Containing Drug Products Hives and Swelling    *Throat swelling*  . Shellfish Allergy Hives and Swelling    Throat swelling    Prescriptions prior to admission  Medication Sig Dispense Refill  . albuterol (PROVENTIL HFA;VENTOLIN HFA) 108 (90 BASE) MCG/ACT inhaler Inhale 2 puffs into the lungs every 6 (six) hours as needed.      . cetirizine (ZYRTEC) 10 MG tablet Take 10 mg by mouth daily.         Review of Systems  Constitutional: Negative.   Respiratory: Negative.   Cardiovascular: Negative.   Gastrointestinal: Negative for nausea, vomiting, abdominal pain, diarrhea and constipation.  Genitourinary: Negative for dysuria, urgency, frequency, hematuria and flank pain.       Negative for vaginal bleeding, vaginal discharge, dyspareunia  Musculoskeletal: Negative.   Skin:       + lesions   Neurological: Negative.   Psychiatric/Behavioral: Negative.    Physical Exam   Last menstrual period 02/25/2011.  Physical Exam  Nursing note and vitals reviewed. Constitutional: She is oriented to person, place, and time. She appears well-developed and well-nourished. No distress.  Cardiovascular: Normal rate.   Respiratory: Effort normal.  Musculoskeletal: Normal range of motion.  Neurological: She is alert and oriented to  person, place, and time.  Skin: Skin is warm and dry.     Psychiatric: She has a normal mood and affect.    MAU Course  Procedures    Assessment and Plan  26 y.o. female with left buttock and right thigh furuncles Rx Bactrim DS BID x 7 days, Percocet 5/325 #15 for pain F/U in office if no improvement   Shannie Kontos 04/04/2011, 9:18 PM

## 2011-06-24 ENCOUNTER — Encounter (HOSPITAL_COMMUNITY): Payer: Self-pay | Admitting: Obstetrics and Gynecology

## 2011-06-24 ENCOUNTER — Inpatient Hospital Stay (HOSPITAL_COMMUNITY)
Admission: AD | Admit: 2011-06-24 | Discharge: 2011-06-24 | Disposition: A | Discharge status: Home / Self Care | Payer: Medicaid Other | Source: Ambulatory Visit | Attending: Obstetrics & Gynecology | Admitting: Obstetrics & Gynecology

## 2011-06-24 DIAGNOSIS — D539 Nutritional anemia, unspecified: Secondary | ICD-10-CM

## 2011-06-24 DIAGNOSIS — R209 Unspecified disturbances of skin sensation: Secondary | ICD-10-CM | POA: Insufficient documentation

## 2011-06-24 DIAGNOSIS — D531 Other megaloblastic anemias, not elsewhere classified: Secondary | ICD-10-CM

## 2011-06-24 HISTORY — DX: Other specified health status: Z78.9

## 2011-06-24 HISTORY — DX: Encounter for adoption services: Z02.82

## 2011-06-24 HISTORY — DX: Obesity, unspecified: E66.9

## 2011-06-24 LAB — COMPREHENSIVE METABOLIC PANEL
ALT: 22 U/L (ref 0–35)
AST: 24 U/L (ref 0–37)
Albumin: 3.5 g/dL (ref 3.5–5.2)
Alkaline Phosphatase: 31 U/L — ABNORMAL LOW (ref 39–117)
BUN: 9 mg/dL (ref 6–23)
CO2: 27 mEq/L (ref 19–32)
Calcium: 8.6 mg/dL (ref 8.4–10.5)
Chloride: 105 mEq/L (ref 96–112)
Creatinine, Ser: 0.8 mg/dL (ref 0.50–1.10)
GFR calc Af Amer: >90 mL/min (ref >90)
GFR calc non Af Amer: >90 mL/min (ref >90)
Glucose, Bld: 90 mg/dL (ref 70–99)
Potassium: 3.9 mEq/L (ref 3.5–5.1)
Sodium: 140 mEq/L (ref 135–145)
Total Bilirubin: 0.8 mg/dL (ref 0.3–1.2)
Total Protein: 6.6 g/dL (ref 6.0–8.3)

## 2011-06-24 LAB — URINE MICROSCOPIC-ADD ON

## 2011-06-24 LAB — RETICULOCYTES
RBC.: 2.52 MIL/uL — ABNORMAL LOW (ref 3.87–5.11)
Retic Count, Absolute: 52.9 10*3/uL (ref 19.0–186.0)
Retic Ct Pct: 2.1 % (ref 0.4–3.1)

## 2011-06-24 LAB — CBC
HCT: 26.6 % — ABNORMAL LOW (ref 36.0–46.0)
Hemoglobin: 9.3 g/dL — ABNORMAL LOW (ref 12.0–15.0)
MCH: 36.9 pg — ABNORMAL HIGH (ref 26.0–34.0)
MCHC: 35 g/dL (ref 30.0–36.0)
MCV: 105.6 fL — ABNORMAL HIGH (ref 78.0–100.0)
Platelets: 116 10*3/uL — ABNORMAL LOW (ref 150–400)
RBC: 2.52 MIL/uL — ABNORMAL LOW (ref 3.87–5.11)
RDW: 17.4 % — ABNORMAL HIGH (ref 11.5–15.5)
WBC: 4.3 10*3/uL (ref 4.0–10.5)

## 2011-06-24 LAB — URINALYSIS, ROUTINE W REFLEX MICROSCOPIC
Bilirubin Urine: NEGATIVE
Glucose, UA: NEGATIVE mg/dL
Ketones, ur: NEGATIVE mg/dL
Leukocytes, UA: NEGATIVE
Nitrite: NEGATIVE
Protein, ur: NEGATIVE mg/dL
Specific Gravity, Urine: >1.03 — ABNORMAL HIGH (ref 1.005–1.030)
Urobilinogen, UA: 1 mg/dL (ref 0.0–1.0)
pH: 5.5 (ref 5.0–8.0)

## 2011-06-24 LAB — GLUCOSE, CAPILLARY: Glucose-Capillary: 87 mg/dL (ref 70–99)

## 2011-06-24 LAB — POCT PREGNANCY, URINE: Preg Test, Ur: NEGATIVE

## 2011-06-24 MED ORDER — FOLIC ACID 1 MG PO TABS: 1.0000 mg | ORAL_TABLET | Freq: Every day | ORAL | Status: DC

## 2011-06-24 MED ORDER — CYANOCOBALAMIN 1000 MCG/ML IJ SOLN
1000.0000 ug | INTRAMUSCULAR | Status: AC
  Administered 2011-06-24: 1000 ug via INTRAMUSCULAR
  Filled 2011-06-24: qty 1

## 2011-06-24 NOTE — MAU Note (Signed)
Toes have been tingling for months her mother told her a sign of diabetes, fatigue, LMP 06/10/11 has had tubal ligation previously.

## 2011-06-24 NOTE — Discharge Instructions (Signed)
Pernicious Anemia Pernicious anemia may be an immune system illness. It causes the production of antibodies to cells of the stomach (parietal cells), and proteins produced by the stomach, which are needed to absorb vitamin B12. The result of this illness is the body does not absorb enough B12 from the diet. This leads to lessened red blood cell production which causes anemia. Vitamin B12 is needed for making red blood cells and keeping the nervous system healthy. Not enough vitamin B12 in your system slowly affects sensory and motor nerves. This causes problems with your nervous system (neurological) to develop over time. Neurological effects of vitamin B12 deficiency may be seen before anemia is diagnosed. This affects both men and women, between ages 40 and 70. The anemia also affects the bowel, the heart and vascular systems and cannot be prevented. CAUSES  Pernicious anemia is due to a lack of substance called intrinsic factor. This is a substance made by cells in the stomach. It makes it possible to absorb vitamin B12. The reason for the lack of this substance is unknown but it may be autoimmune, genetic, or both. SYMPTOMS  The following problems may be seen with this illness:  Problems develop slowly.   Rapid heart rate.   Nausea, appetite loss, and weight loss.   Difficulty maintaining proper balance.   Yellow eyes and skin.   Loss of deep tendon reflexes.   Depression.   Confusion, poor memory, and dementia.   Ringing in the ears (tinnitus).   Weakness, especially in the arms and legs.   Sore tongue.   Numbness or tingling in the hands and feet.   Pale lips, tongue, and gums.   Bleeding gums.   Shortness of breath.   Headache.   Fatigue.  RISK OF VITAMIN B12 DEFICIENCY INCREASES WITH:  Diseases or surgery affecting the stomach.   Diabetes and autoimmune disorders. Autoimmune disorders are diseases where the body makes antibodies which attack your own body tissues.     Thyroid disorders.   Genetic factors, such as in people of Northern European ancestry. It is rare in African Americans and Asians.   Family history of pernicious anemia.   Age over 40.   Strict vegetarian diet or infants breast-fed by a mother on a strict vegetarian diet.   Lack of stomach acid in older adults.   Parasitic infections and intestinal diseases.   Drugs such as H2 blockers, proton pump inhibitors, colchicine, neomycin, and aminosalicylic acid.   Alcoholism.  PREVENTION  Pernicious anemia cannot be prevented but vitamin B12 deficiencies can be prevented.   For pernicious anemia, lifelong vitamin B12 therapy will help symptoms and prevent complications.   Dietary changes can prevent deficiency. B12 is mostly from animal sources so a deficiency is more likely in a vegetarian who does not eat eggs or dairy products.  RELATED COMPLICATIONS  Heart failure.   Nerve damage that can not be reversed.   Gastric cancer.  DIAGNOSIS  Your caregiver can determine what is wrong by:  Doing blood tests for vitamin B12 levels.   Checking for antibodies to the intrinsic factor.   Measuring the body's ability to absorb vitamin B12.  TREATMENT   Life long treatment usually involves vitamin B12 replacement. Monthly vitamin B12 injections are the treatment of choice to correct vitamin B12 deficiency and may be given by the patient. This therapy corrects the anemia and it may correct the neurological complications if given early enough. About 1% of vitamin B12 is absorbed (even in   the absence of intrinsic factor) so some caregivers recommend that elderly patients with gastric atrophy take oral vitamin B12 supplements in addition to monthly injections.   Some symptoms should start to clear up in a few days after treatment begins but other symptoms may take several months.   Additionally, other conditions which may lead to a deficiency should be treated.   Stop drinking alcohol  if alcoholism led to the vitamin B12 deficiency.   For other patients, the vitamin may be taken by mouth or as a nasal gel (or in addition to injections).   Iron supplements may be prescribed.   Avoid taking high amounts of folic acid. It can mask the signs of vitamin B12 deficiency.   Activity may be limited until symptoms improve.   Eat a well-balanced diet.   People on strict vegetarian diets can change their diet or take vitamin B12 supplements for life.  PROGNOSIS   When caught early, the prognosis is good. Most people will do well.   Patients with this illness have a higher incidence of cancer and polyps of the stomach.   Nervous system problems may not improve if treatment does not start soon enough.  Document Released: 04/08/2003 Document Revised: 01/05/2011 Document Reviewed: 01/14/2008 [employer] Patient Information 2012 [employer]. 

## 2011-06-24 NOTE — MAU Note (Signed)
Pt presents to MAU with chief complaint of numbness and tingling in her toes; bilateral. Pt says this has been going on for a long time. Pt is adopted and does not know a lot about her family history, adopted family recommended she come and get checked out because they are concerned this is diabetes. Pt says at times she gets dizzy and has fatigue. Pt has a history of obesity and asthma. G2P1; non pregnant, tubal ligation, prior c-section.

## 2011-06-25 LAB — IRON AND TIBC
Iron: 159 ug/dL — ABNORMAL HIGH (ref 42–135)
Saturation Ratios: 57 % — ABNORMAL HIGH (ref 20–55)
TIBC: 281 ug/dL (ref 250–470)
UIBC: 122 ug/dL — ABNORMAL LOW (ref 125–400)

## 2011-06-25 LAB — VITAMIN B12: Vitamin B-12: 50 pg/mL — ABNORMAL LOW (ref 211–911)

## 2011-06-25 LAB — FERRITIN: Ferritin: 46 ng/mL (ref 10–291)

## 2011-06-25 LAB — FOLATE: Folate: >20 ng/mL

## 2011-11-26 ENCOUNTER — Encounter (HOSPITAL_COMMUNITY): Payer: Self-pay

## 2011-11-26 ENCOUNTER — Emergency Department (INDEPENDENT_AMBULATORY_CARE_PROVIDER_SITE_OTHER)
Admission: EM | Admit: 2011-11-26 | Discharge: 2011-11-26 | Disposition: A | Discharge status: Home / Self Care | Payer: Medicaid Other | Source: Home / Self Care | Attending: Emergency Medicine | Admitting: Emergency Medicine

## 2011-11-26 ENCOUNTER — Emergency Department (INDEPENDENT_AMBULATORY_CARE_PROVIDER_SITE_OTHER): Payer: Medicaid Other

## 2011-11-26 DIAGNOSIS — S93409A Sprain of unspecified ligament of unspecified ankle, initial encounter: Secondary | ICD-10-CM

## 2011-11-26 MED ORDER — TRAMADOL HCL 50 MG PO TABS: 100.0000 mg | ORAL_TABLET | Freq: Three times a day (TID) | ORAL | Status: DC | PRN

## 2011-11-26 NOTE — ED Notes (Signed)
Right ankle pain x 1 day, patient states she tripped and fell down stairs yesterday

## 2011-11-26 NOTE — ED Provider Notes (Signed)
Chief Complaint  Patient presents with  . Ankle Pain    History of Present Illness:   The patient is a 26 year old female who presents with a two-day history of right ankle pain. She tripped and fell down about 6 steps yesterday. She landed on the side of her ankle, twisting her ankle. She did not hear a pop. Ever since then there has been pain over the lateral malleolus, extending down to the foot. She is able to take a few steps. She denies any numbness or tingling. She denies hitting her head or any loss of consciousness. She denies any injury anywhere else.  Review of Systems:  Other than noted above, the patient denies any of the following symptoms: Systemic:  No fevers, chills, sweats, or aches.   Musculoskeletal:  No joint pain, arthritis, bursitis, swelling, back pain, or neck pain. Neurological:  No muscular weakness or paresthesias.  PMFSH:  Past medical history, family history, social history, meds, and allergies were reviewed.  Physical Exam:   Vital signs:  BP 117/77  Pulse 77  Temp 98.7 F (37.1 C) (Oral)  Resp 18  SpO2 100%  LMP 11/12/2011 Gen:  Alert and oriented times 3.  In no distress. Musculoskeletal: Exam of the ankle reveals there is pain to palpation and swelling over lateral malleolus but notes bruising or deformity. The pain extends on down to the dorsum of the foot. Anterior drawer sign negative.  Talar tilt negative. Squeeze test negative. Achilles tendon, peroneal tendon, and tibialis posterior were intact. Otherwise, all joints had a full a ROM with no swelling, bruising or deformity.  No edema, pulses full. Extremities were warm and pink.  Capillary refill was brisk.  Skin:  Clear, warm and dry.  No rash. Neuro:  Alert and oriented times 3.  Muscle strength was normal.  Sensation was intact to light touch.   Radiology:  Dg Ankle Complete Right  11/26/2011  *RADIOLOGY REPORT*  Clinical Data: Lateral ankle and foot pain since falling yesterday.  RIGHT ANKLE -  COMPLETE 3+ VIEW  Comparison: None.  Findings: The mineralization and alignment are normal.  There is no evidence of acute fracture or dislocation.  There is no growth plate widening.  Lateral soft tissue swelling is noted.  IMPRESSION: No acute osseous findings.   Original Report Authenticated By: Gerrianne Scale, M.D.    Dg Foot Complete Right  11/26/2011  *RADIOLOGY REPORT*  Clinical Data: Lateral ankle and foot pain since falling yesterday.  RIGHT FOOT COMPLETE - 3+ VIEW  Comparison: None.  Findings: The mineralization and alignment are normal.  There is no evidence of acute fracture or dislocation.  The joint spaces are preserved.  There is mild lateral soft tissue swelling.  IMPRESSION: No acute osseous findings.   Original Report Authenticated By: Gerrianne Scale, M.D.    I reviewed the images independently and personally and concur with the radiologist's findings.  Course in Urgent Care Center:   She was put in an ASO brace and given crutches for ambulation.  Assessment:  The encounter diagnosis was Ankle sprain.  Plan:   1.  The following meds were prescribed:   New Prescriptions   TRAMADOL (ULTRAM) 50 MG TABLET    Take 2 tablets (100 mg total) by mouth every 8 (eight) hours as needed for pain.   2.  The patient was instructed in symptomatic care, including rest and activity, elevation, application of ice and compression.  Appropriate handouts were given. 3.  The patient was  told to return if becoming worse in any way, if no better in 3 or 4 days, and given some red flag symptoms that would indicate earlier return.   4.  The patient was told to follow up with Dr. Magnus Ivan if no better in 2 weeks.    Reuben Likes, MD 11/26/11 2022

## 2011-12-15 IMAGING — US US OB COMP LESS 14 WK
1 series · 13 of 28 positions shown · non-contrast
Comparison: None.

CLINICAL DATA: Vaginal bleeding.

OBSTETRIC <14 WK US AND TRANSVAGINAL OB US
TECHNIQUE: Both transabdominal and transvaginal ultrasound
examinations were performed for complete evaluation of the
gestation as well as the maternal uterus, adnexal regions, and
pelvic cul-de-sac.  Transvaginal technique was performed to assess
early pregnancy.

[Series 1: us ob comp less 14 wks · 0.25mm/px · 13 of 36 slices shown]
[im 2/36]
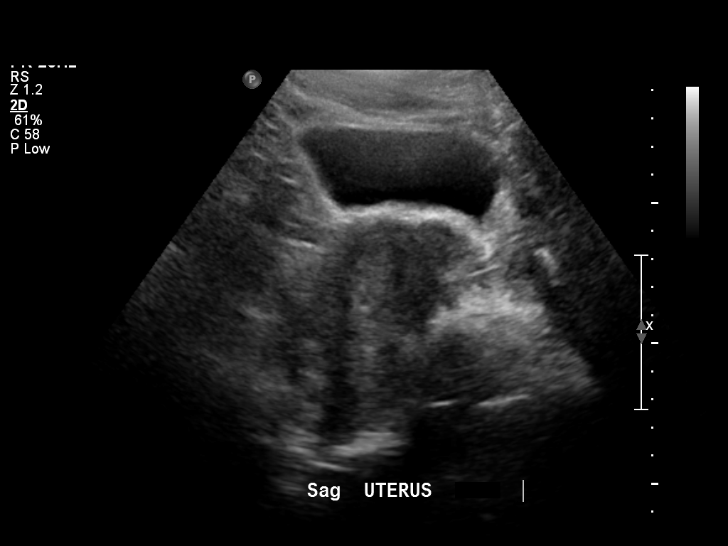
[im 4/36]
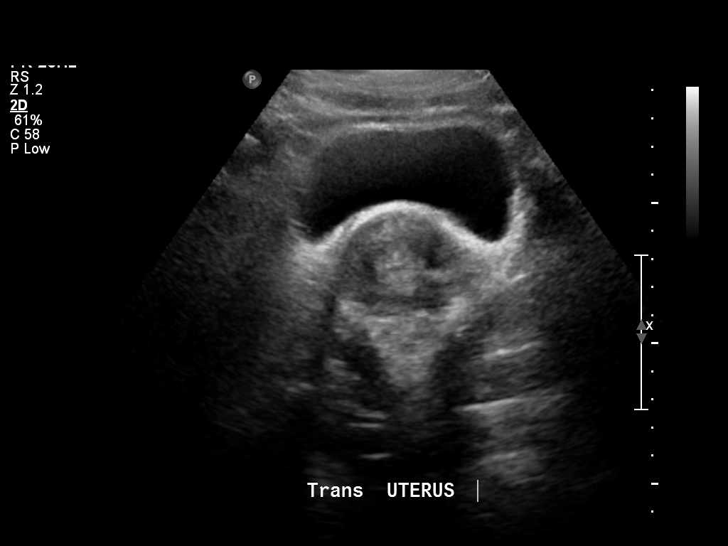
[im 7/36]
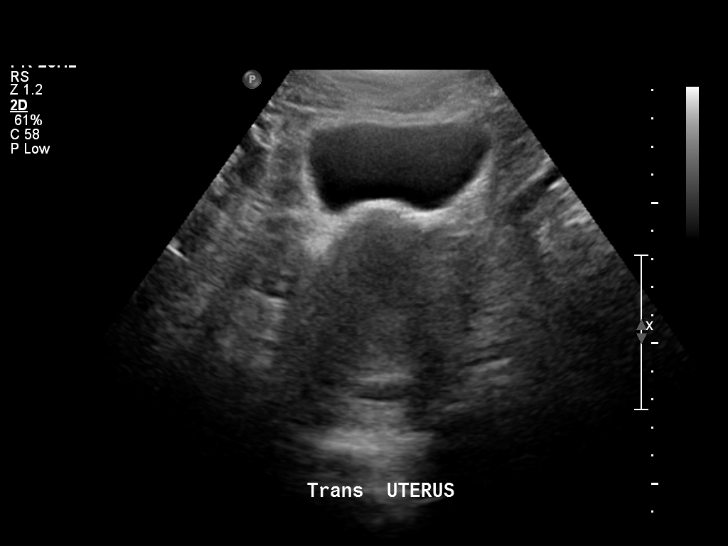
[im 10/36]
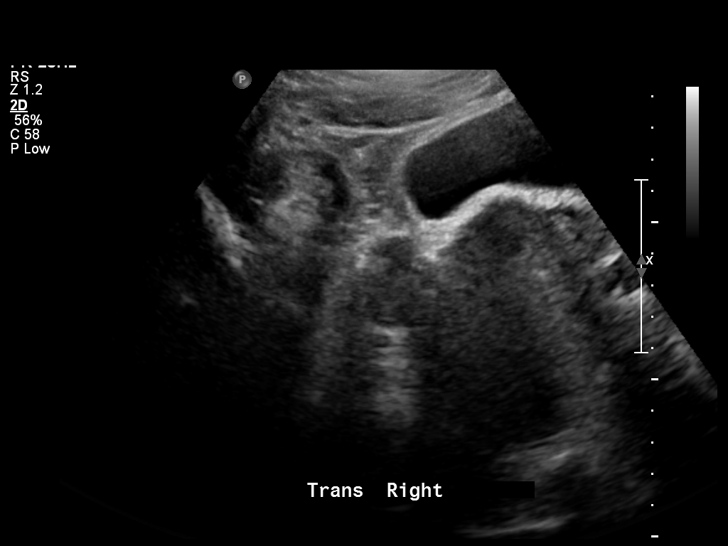
[im 12/36]
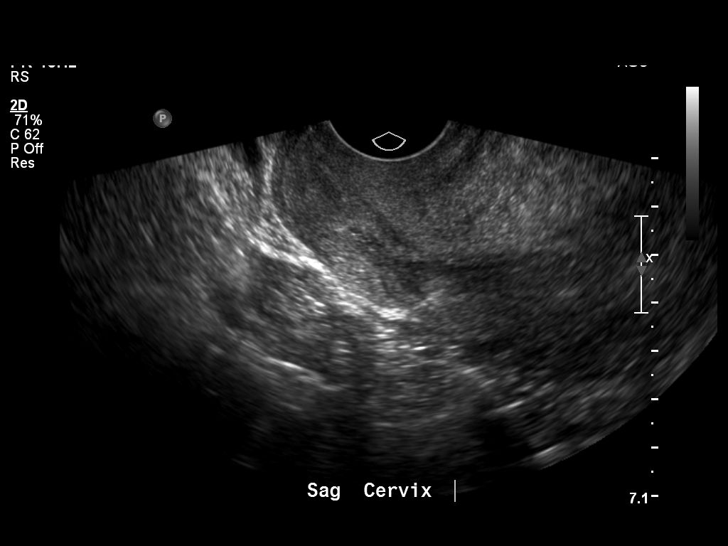
[im 15/36]
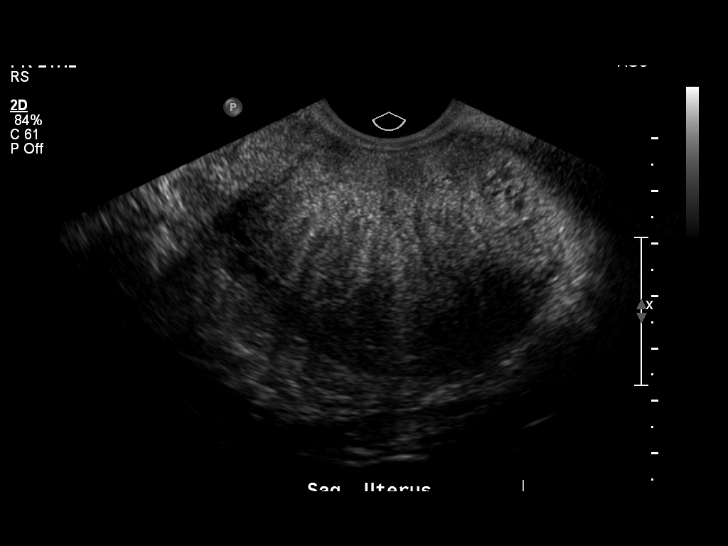
[im 19/36]
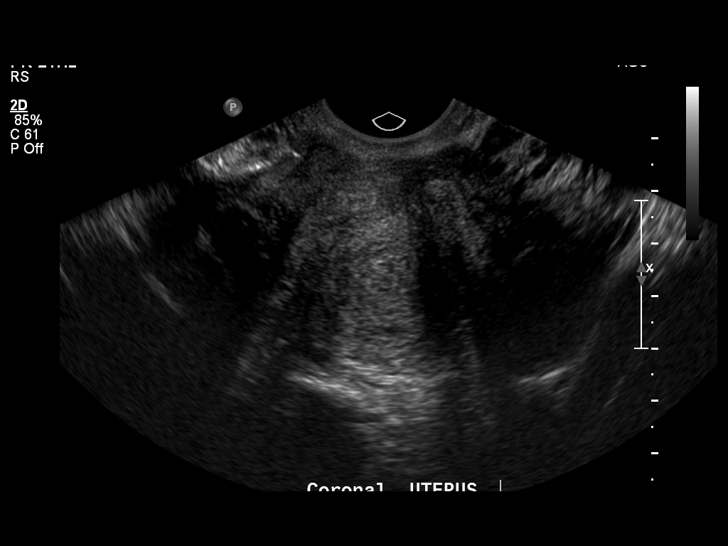
[im 21/36]
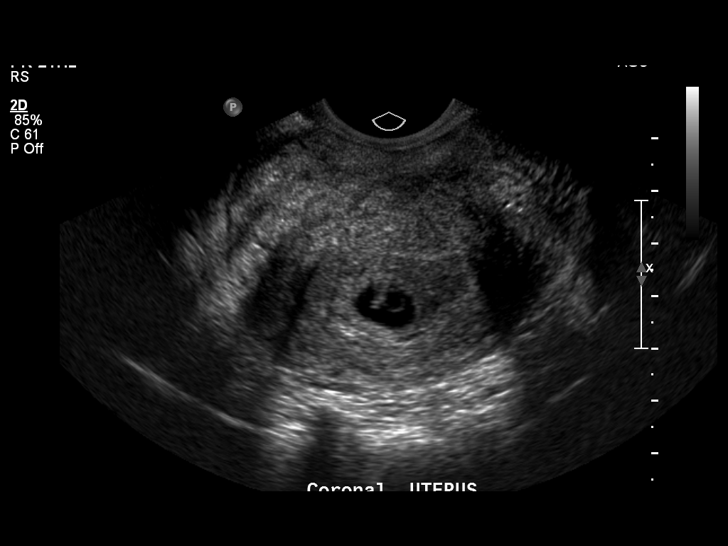
[im 24/36]
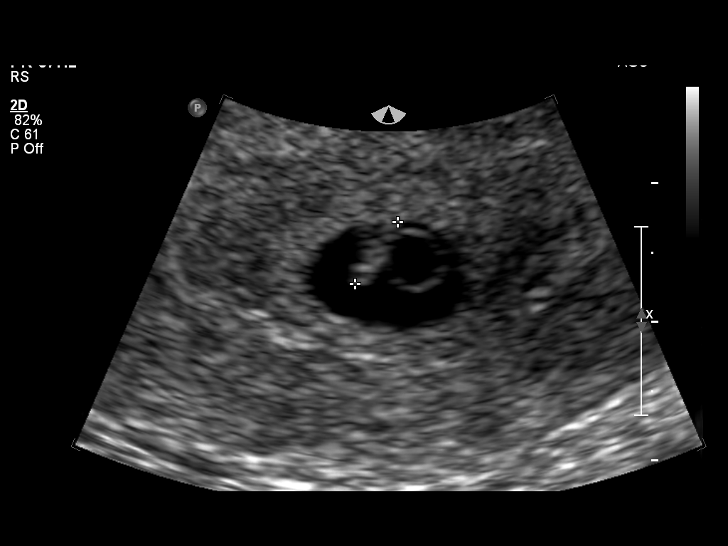
[im 26/36]
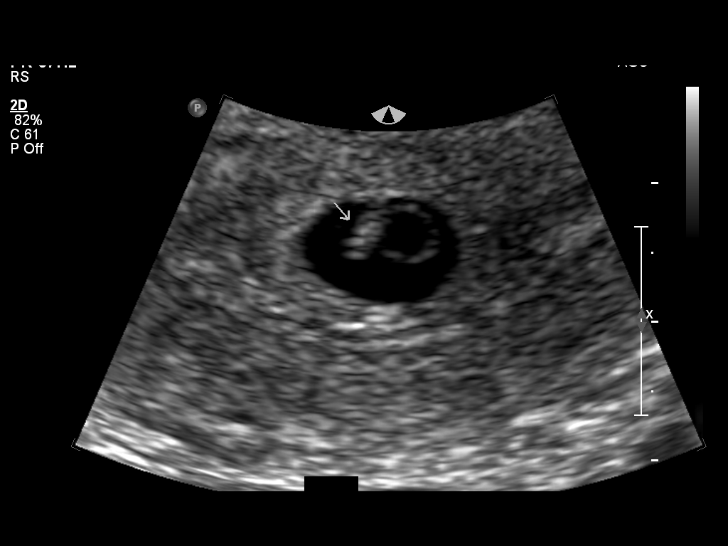
[im 29/36]
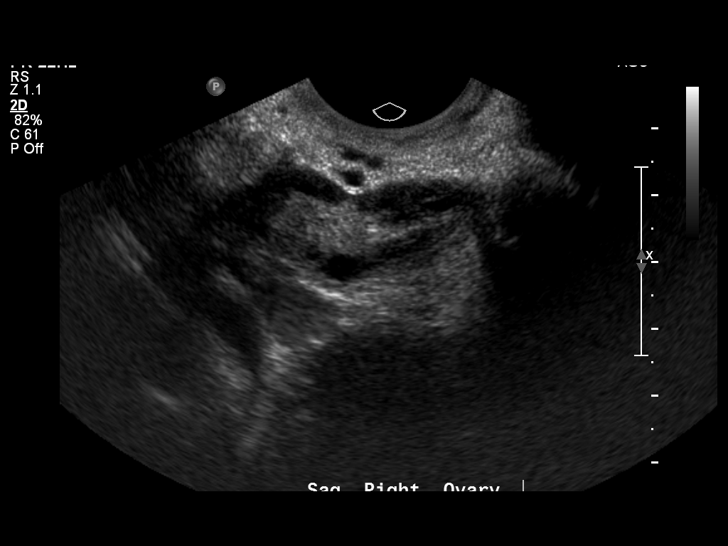
[im 32/36]
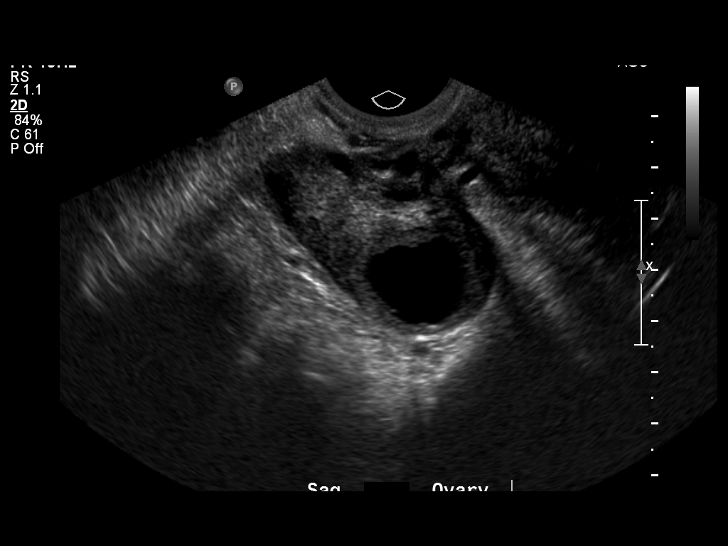
[im 34/36]
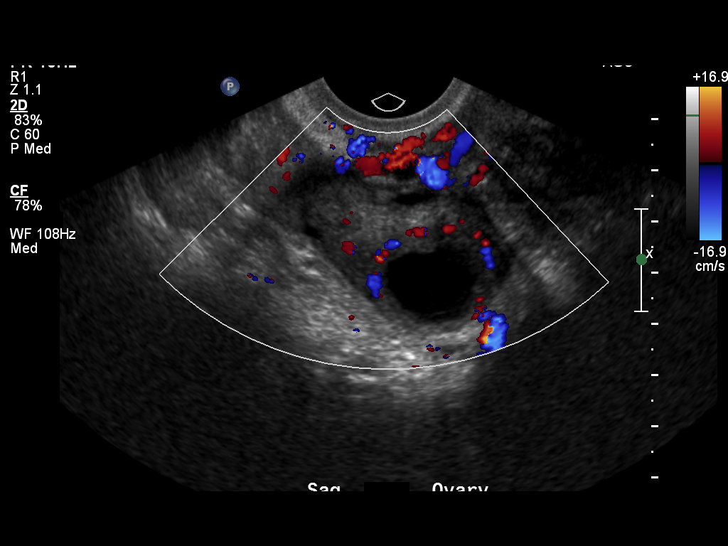

[13 of 28 positions shown; findings below may reference images not displayed]

FINDINGS: A single intrauterine gestational sac is identified. The embryo has
a crown-rump length of 5.4 mm, corresponding to a gestational age
of 6 weeks 3 days.  A yolk sac is seen.  Cardiac activity is
visualized; the measured heart rate is 111 beats per minute, within
normal limits.

A small amount of subchorionic hemorrhage is noted.  The uterus is
otherwise unremarkable in appearance.  Myometrial echogenicity is
within normal limits.  No definite fibroids are seen.

The ovaries are unremarkable in appearance.  The right ovary
measures 3.3 x 1.4 x 1.9 cm, while the left ovary measures 5.1 x
2.6 x 2.3 cm.  No suspicious adnexal masses are seen.  There is no
overt evidence for ovarian torsion.

No free fluid is seen in the pelvic cul-de-sac.
IMPRESSION: 1.  Single live intrauterine pregnancy, with a crown-rump length of
5.4 mm, corresponding to a gestational age of 6 weeks 3 days.  This
matches the patient's gestational age by LMP of 6 weeks 4 days, and
reflects an estimated date of delivery March 23, 2010.
2.  Small amount of subchorionic hemorrhage noted.

## 2011-12-26 ENCOUNTER — Encounter (HOSPITAL_COMMUNITY): Payer: Self-pay | Admitting: *Deleted

## 2011-12-26 ENCOUNTER — Inpatient Hospital Stay (HOSPITAL_COMMUNITY)
Admission: AD | Admit: 2011-12-26 | Discharge: 2011-12-26 | Disposition: A | Discharge status: Home / Self Care | Payer: Medicaid Other | Source: Ambulatory Visit | Attending: Obstetrics | Admitting: Obstetrics

## 2011-12-26 DIAGNOSIS — N3091 Cystitis, unspecified with hematuria: Secondary | ICD-10-CM

## 2011-12-26 DIAGNOSIS — L293 Anogenital pruritus, unspecified: Secondary | ICD-10-CM | POA: Insufficient documentation

## 2011-12-26 DIAGNOSIS — N3090 Cystitis, unspecified without hematuria: Secondary | ICD-10-CM

## 2011-12-26 DIAGNOSIS — R350 Frequency of micturition: Secondary | ICD-10-CM | POA: Insufficient documentation

## 2011-12-26 HISTORY — DX: Depression, unspecified: F32.A

## 2011-12-26 HISTORY — DX: Major depressive disorder, single episode, unspecified: F32.9

## 2011-12-26 LAB — URINALYSIS, ROUTINE W REFLEX MICROSCOPIC
Bilirubin Urine: NEGATIVE
Glucose, UA: NEGATIVE mg/dL
Ketones, ur: NEGATIVE mg/dL
Nitrite: NEGATIVE
Protein, ur: NEGATIVE mg/dL
Specific Gravity, Urine: >1.03 — ABNORMAL HIGH (ref 1.005–1.030)
Urobilinogen, UA: 0.2 mg/dL (ref 0.0–1.0)
pH: 6 (ref 5.0–8.0)

## 2011-12-26 LAB — URINE MICROSCOPIC-ADD ON

## 2011-12-26 LAB — WET PREP, GENITAL
Trich, Wet Prep: NONE SEEN
Yeast Wet Prep HPF POC: NONE SEEN

## 2011-12-26 LAB — POCT PREGNANCY, URINE: Preg Test, Ur: NEGATIVE

## 2011-12-26 MED ORDER — PHENAZOPYRIDINE HCL 100 MG PO TABS
200.0000 mg | ORAL_TABLET | Freq: Once | ORAL | Status: AC
  Administered 2011-12-26: 200 mg via ORAL
  Filled 2011-12-26: qty 2

## 2011-12-26 MED ORDER — CIPROFLOXACIN HCL 500 MG PO TABS
500.0000 mg | ORAL_TABLET | Freq: Once | ORAL | Status: AC
  Administered 2011-12-26: 500 mg via ORAL
  Filled 2011-12-26: qty 1

## 2011-12-26 MED ORDER — PHENAZOPYRIDINE HCL 100 MG PO TABS: 100.0000 mg | ORAL_TABLET | Freq: Three times a day (TID) | ORAL | Status: DC | PRN

## 2011-12-26 MED ORDER — CIPROFLOXACIN HCL 500 MG PO TABS: 500.0000 mg | ORAL_TABLET | Freq: Two times a day (BID) | ORAL | Status: DC

## 2011-12-26 NOTE — MAU Note (Signed)
Pt reports vaginal irritation, denies vaginal discharge. Also reports urinary frequency x 3 days, denies dysuria. LMP 12/04/2011

## 2011-12-26 NOTE — MAU Provider Note (Signed)
History     CSN: [id number]  Arrival date and time: 12/26/11 2000   First Provider Initiated Contact with Patient 12/26/11 2031      Chief Complaint  Patient presents with  . Vaginal Itching   HPI Jill Summers is a 26 y.o. female who presents to MAU with frequent urination. The symptoms started yesterday. She also request STI screening. She denies pain or vaginal bleeding. Current sex partner x 2 years. Last pap smear one year ago and was normal. The history was provided by the patient.   OB History    Grav Para Term Preterm Abortions TAB SAB Ect Mult Living   2 2 1 1  0 0 0 0 0 2      Past Medical History  Diagnosis Date  . Asthma   . Obesity   . Adopted   . Depression     Past Surgical History  Procedure Date  . Cesarean section     Family History  Problem Relation Age of Onset  . Adopted: Yes  . Hypertension Mother   . Diabetes Father     History  Substance Use Topics  . Smoking status: Never Smoker   . Smokeless tobacco: Former Neurosurgeon    Quit date: 03/14/2011  . Alcohol Use: 1.0 oz/week    2 drink(s) per week    Allergies:  Allergies  Allergen Reactions  . Peanut-Containing Drug Products Anaphylaxis, Hives and Swelling    *Throat swelling*  . Shellfish Allergy Anaphylaxis, Hives and Swelling    Throat swelling    Prescriptions prior to admission  Medication Sig Dispense Refill  . albuterol (PROVENTIL HFA;VENTOLIN HFA) 108 (90 BASE) MCG/ACT inhaler Inhale 2 puffs into the lungs every 6 (six) hours as needed. For asthma      . buPROPion (WELLBUTRIN XL) 150 MG 24 hr tablet Take 150 mg by mouth daily. XL, verified with pt's pharmacy      . cetirizine (ZYRTEC) 10 MG tablet Take 10 mg by mouth at bedtime.       . folic acid (FOLVITE) 1 MG tablet Take 1 tablet (1 mg total) by mouth daily.  30 tablet  3  . ibuprofen (ADVIL,MOTRIN) 200 MG tablet Take 200 mg by mouth daily as needed. For pain/headaches        Review of Systems  Constitutional:  Negative for fever and chills.  Gastrointestinal: Negative for nausea, vomiting and abdominal pain.  Genitourinary: Positive for frequency. Negative for dysuria and urgency.  Musculoskeletal: Negative for back pain.  Skin: Negative for rash.  Neurological: Negative for headaches.  Psychiatric/Behavioral: Negative for depression. The patient is not nervous/anxious.    Physical Exam   Blood pressure 117/72, pulse 91, temperature 99.1 F (37.3 C), temperature source Oral, resp. rate 18, height 5\' 3"  (1.6 m), weight 226 lb (102.513 kg), last menstrual period 12/04/2011, SpO2 100.00%.  Physical Exam  Nursing note and vitals reviewed. Constitutional: She is oriented to person, place, and time. No distress.       Obese A/A female  HENT:  Head: Normocephalic and atraumatic.  Eyes: EOM are normal.  Neck: Neck supple.  Cardiovascular: Normal rate.   Respiratory: Effort normal.  GI: Soft. There is no tenderness.  Genitourinary:       External genitalia without lesions. Frothy malodorous discharge vaginal vault. Cervix inflamed. No CMT, no adnexal tenderness. Unable to palpate uterus due to patient habitus.  Musculoskeletal: Normal range of motion.  Neurological: She is alert and oriented to person, place,  and time.  Skin: Skin is warm and dry.  Psychiatric: She has a normal mood and affect. Her behavior is normal. Judgment and thought content normal.   Results for orders placed during the hospital encounter of 12/26/11 (from the past 24 hour(s))  URINALYSIS, ROUTINE W REFLEX MICROSCOPIC     Status: Abnormal   Collection Time   12/26/11  8:15 PM      Component Value Range   Color, Urine YELLOW  YELLOW   APPearance CLEAR  CLEAR   Specific Gravity, Urine >1.030 (*) 1.005 - 1.030   pH 6.0  5.0 - 8.0   Glucose, UA NEGATIVE  NEGATIVE mg/dL   Hgb urine dipstick LARGE (*) NEGATIVE   Bilirubin Urine NEGATIVE  NEGATIVE   Ketones, ur NEGATIVE  NEGATIVE mg/dL   Protein, ur NEGATIVE  NEGATIVE  mg/dL   Urobilinogen, UA 0.2  0.0 - 1.0 mg/dL   Nitrite NEGATIVE  NEGATIVE   Leukocytes, UA TRACE (*) NEGATIVE  URINE MICROSCOPIC-ADD ON     Status: Abnormal   Collection Time   12/26/11  8:15 PM      Component Value Range   Squamous Epithelial / LPF FEW (*) RARE   WBC, UA 11-20  <3 WBC/hpf   RBC / HPF 7-10  <3 RBC/hpf   Bacteria, UA FEW (*) RARE  WET PREP, GENITAL     Status: Abnormal   Collection Time   12/26/11  8:40 PM      Component Value Range   Yeast Wet Prep HPF POC NONE SEEN  NONE SEEN   Trich, Wet Prep NONE SEEN  NONE SEEN   Clue Cells Wet Prep HPF POC FEW (*) NONE SEEN   WBC, Wet Prep HPF POC FEW (*) NONE SEEN  POCT PREGNANCY, URINE     Status: Normal   Collection Time   12/26/11  8:41 PM      Component Value Range   Preg Test, Ur NEGATIVE  NEGATIVE   Assessment: 26 y.o. female with hemorrhagic cystitis  Plan:  Cultures for GC and Chlamydia sent   Rx Cipro   Pyridium   Follow up with Dr. Clearance Coots, return as needed Discussed with the patient and all questioned fully answered. She will return if any problems arise.   Medication List     As of 12/26/2011  9:42 PM    START taking these medications         ciprofloxacin 500 MG tablet   Commonly known as: CIPRO   Take 1 tablet (500 mg total) by mouth every 12 (twelve) hours.      phenazopyridine 100 MG tablet   Commonly known as: PYRIDIUM   Take 1 tablet (100 mg total) by mouth 3 (three) times daily as needed for pain.      CONTINUE taking these medications         albuterol 108 (90 BASE) MCG/ACT inhaler   Commonly known as: PROVENTIL HFA;VENTOLIN HFA      buPROPion 150 MG 24 hr tablet   Commonly known as: WELLBUTRIN XL      cetirizine 10 MG tablet   Commonly known as: ZYRTEC      folic acid 1 MG tablet   Commonly known as: FOLVITE   Take 1 tablet (1 mg total) by mouth daily.      ibuprofen 200 MG tablet   Commonly known as: ADVIL,MOTRIN          Where to get your medications    These are  the  prescriptions that you need to pick up. We sent them to a specific pharmacy, so you will need to go there to get them.   [id number] [place], [place] - [address]    [address] [place] [address]    Phone: [phone]        ciprofloxacin 500 MG tablet   phenazopyridine 100 MG tablet           Procedures  NEESE,HOPE, RN, FNP, [facility] 12/26/2011, 9:36 PM

## 2011-12-26 NOTE — Discharge Instructions (Signed)
Urinary Tract Infection  A urinary tract infection (UTI) is often caused by a germ (bacteria). A UTI is usually helped with medicine (antibiotics) that kills germs. Take all the medicine until it is gone. Do this even if you are feeling better. You are usually better in 7 to 10 days.  HOME CARE    Drink enough water and fluids to keep your pee (urine) clear or pale yellow. Drink:   Cranberry juice.   Water.   Avoid:   Caffeine.   Tea.   Bubbly (carbonated) drinks.   Alcohol.   Only take medicine as told by your doctor.   To prevent further infections:   Pee often.   After pooping (bowel movement), women should wipe from front to back. Use each tissue only once.   Pee before and after having sex (intercourse).  Ask your doctor when your test results will be ready. Make sure you follow up and get your test results.   GET HELP RIGHT AWAY IF:    There is very bad back pain or lower belly (abdominal) pain.   You get the chills.   You have a fever.   Your baby is older than 3 months with a rectal temperature of 102 F (38.9 C) or higher.   Your baby is 3 months old or younger with a rectal temperature of 100.4 F (38 C) or higher.   You feel sick to your stomach (nauseous) or throw up (vomit).   There is continued burning with peeing.   Your problems are not better in 3 days. Return sooner if you are getting worse.  MAKE SURE YOU:    Understand these instructions.   Will watch your condition.   Will get help right away if you are not doing well or get worse.  Document Released: 07/05/2007 Document Revised: 04/10/2011 Document Reviewed: 07/05/2007  [employer] Patient Information 2013 [employer].

## 2011-12-27 LAB — GC/CHLAMYDIA PROBE AMP
CT Probe RNA: NEGATIVE
GC Probe RNA: NEGATIVE

## 2011-12-28 LAB — URINE CULTURE: Colony Count: >=100000

## 2012-03-08 ENCOUNTER — Encounter (HOSPITAL_COMMUNITY): Payer: Self-pay

## 2012-03-08 ENCOUNTER — Inpatient Hospital Stay (HOSPITAL_COMMUNITY)
Admission: AD | Admit: 2012-03-08 | Discharge: 2012-03-08 | Disposition: A | Discharge status: Home / Self Care | Payer: Medicaid Other | Source: Ambulatory Visit | Attending: Obstetrics & Gynecology | Admitting: Obstetrics & Gynecology

## 2012-03-08 DIAGNOSIS — N760 Acute vaginitis: Secondary | ICD-10-CM

## 2012-03-08 DIAGNOSIS — A499 Bacterial infection, unspecified: Secondary | ICD-10-CM

## 2012-03-08 DIAGNOSIS — B9689 Other specified bacterial agents as the cause of diseases classified elsewhere: Secondary | ICD-10-CM

## 2012-03-08 DIAGNOSIS — N949 Unspecified condition associated with female genital organs and menstrual cycle: Secondary | ICD-10-CM | POA: Insufficient documentation

## 2012-03-08 DIAGNOSIS — N938 Other specified abnormal uterine and vaginal bleeding: Secondary | ICD-10-CM | POA: Insufficient documentation

## 2012-03-08 DIAGNOSIS — N925 Other specified irregular menstruation: Secondary | ICD-10-CM | POA: Insufficient documentation

## 2012-03-08 LAB — WET PREP, GENITAL
Trich, Wet Prep: NONE SEEN
Yeast Wet Prep HPF POC: NONE SEEN

## 2012-03-08 LAB — POCT PREGNANCY, URINE: Preg Test, Ur: NEGATIVE

## 2012-03-08 MED ORDER — METRONIDAZOLE 500 MG PO TABS: 500.0000 mg | ORAL_TABLET | Freq: Two times a day (BID) | ORAL | Status: DC

## 2012-03-08 NOTE — MAU Note (Signed)
Patient is in with c/o irregular vaginal bleeding. She states that she had a normal mentrual period 02/27/12. Then she started bleeding this week. Patient states that it isn't enough to wear a pad, notices blood after voiding. Patient denies any pain, dysuria. She had a bilateral tubal ligation 02/09/2010 after her c-section per patient.

## 2012-03-08 NOTE — MAU Provider Note (Signed)
History     CSN: [id number]  Arrival date and time: 03/08/12 5621   First Provider Initiated Contact with Patient 03/08/12 2015      Chief Complaint  Patient presents with  . Vaginal Bleeding   HPI [name] is a 27 y.o. [id number] who presents to MAU today with recent spotting. The patient had LMP 02/27/12 and on 03/04/12 she started noticing spotting when she wiped. She has not had to wear a pad and has not seen the spotting everyday during this time. The patient denies cramping, abnormal discharge, fever, N/V or abdominal pain. She is currently sexually active and uses a condom sometimes. She has had a new partner in the last 2 months.   OB History    Grav Para Term Preterm Abortions TAB SAB Ect Mult Living   2 2 1 1  0 0 0 0 0 2      Past Medical History  Diagnosis Date  . Asthma   . Obesity   . Adopted   . Depression     Past Surgical History  Procedure Date  . Cesarean section   . Cesarean section with bilateral tubal ligation     Family History  Problem Relation Age of Onset  . Adopted: Yes  . Hypertension Mother   . Diabetes Father     History  Substance Use Topics  . Smoking status: Never Smoker   . Smokeless tobacco: Former Neurosurgeon    Quit date: 03/14/2011  . Alcohol Use: 1.0 oz/week    2 drink(s) per week    Allergies:  Allergies  Allergen Reactions  . Peanut-Containing Drug Products Anaphylaxis, Hives and Swelling    *Throat swelling*  . Shellfish Allergy Anaphylaxis, Hives and Swelling    Throat swelling    Prescriptions prior to admission  Medication Sig Dispense Refill  . albuterol (PROVENTIL HFA;VENTOLIN HFA) 108 (90 BASE) MCG/ACT inhaler Inhale 2 puffs into the lungs every 6 (six) hours as needed. For asthma      . buPROPion (WELLBUTRIN XL) 150 MG 24 hr tablet Take 150 mg by mouth daily. XL, verified with pt's pharmacy      . cetirizine (ZYRTEC) 10 MG tablet Take 10 mg by mouth at bedtime.       . ciprofloxacin (CIPRO) 500 MG tablet  Take 1 tablet (500 mg total) by mouth every 12 (twelve) hours.  10 tablet  0  . folic acid (FOLVITE) 1 MG tablet Take 1 tablet (1 mg total) by mouth daily.  30 tablet  3  . ibuprofen (ADVIL,MOTRIN) 200 MG tablet Take 200 mg by mouth daily as needed. For pain/headaches      . phenazopyridine (PYRIDIUM) 100 MG tablet Take 1 tablet (100 mg total) by mouth 3 (three) times daily as needed for pain.  10 tablet  0    Review of Systems  Constitutional: Negative for fever and chills.  Gastrointestinal: Negative for nausea, vomiting and abdominal pain.  Genitourinary: Negative for dysuria, urgency and frequency.       Neg - vaginal discharge + vaginal spotting   Physical Exam   Blood pressure 139/77, pulse 80, temperature 98.1 F (36.7 C), temperature source Oral, resp. rate 20, height 5\' 2"  (1.575 m), weight 230 lb 3.2 oz (104.418 kg), last menstrual period 03/05/2011.  Physical Exam  Constitutional: She is oriented to person, place, and time. She appears well-developed and well-nourished. No distress.  HENT:  Head: Normocephalic and atraumatic.  Cardiovascular: Normal rate, regular rhythm and  normal heart sounds.   Respiratory: Effort normal and breath sounds normal. No respiratory distress.  GI: Soft. Bowel sounds are normal. She exhibits no distension and no mass. There is no tenderness. There is no rebound and no guarding.  Genitourinary: Vagina normal. Uterus is not enlarged and not tender. Cervix exhibits discharge (thin, yellowish frothy discharge noted at the cervical os) and friability (small amount of bleeding from cervix noted while obtaining sample). Cervix exhibits no motion tenderness. Right adnexum displays no mass and no tenderness. Left adnexum displays no mass and no tenderness.  Neurological: She is alert and oriented to person, place, and time.  Skin: Skin is warm and dry. No erythema.  Psychiatric: She has a normal mood and affect.   Results for orders placed during the  hospital encounter of 03/08/12 (from the past 24 hour(s))  POCT PREGNANCY, URINE     Status: Normal   Collection Time   03/08/12  8:01 PM      Component Value Range   Preg Test, Ur NEGATIVE  NEGATIVE  WET PREP, GENITAL     Status: Abnormal   Collection Time   03/08/12  8:25 PM      Component Value Range   Yeast Wet Prep HPF POC NONE SEEN  NONE SEEN   Trich, Wet Prep NONE SEEN  NONE SEEN   Clue Cells Wet Prep HPF POC FEW (*) NONE SEEN   WBC, Wet Prep HPF POC FEW (*) NONE SEEN    MAU Course  Procedures  MDM Discussed patient with Dr. Tamela Oddi. She does not feel that any additional testing is necessary at this time. She would like patient to follow-up in the office in the next few weeks if this continues.   Assessment and Plan  A: Bacterial Vaginosis  P: Discharge home GC/Chlamydia pending. Patient will receive a call if results are positive Rx for Flagyl sent to the patient's pharmacy Discussed hygiene products and probiotics to help avoid recurrence Patient instructed to follow-up with Dr. Tamela Oddi if the spotting does not resolve after treatment or if the bleeding gets heavier Patient may return to MAU as needed or if her condition should change or worsen  Freddi Starr, PA-C 03/08/2012, 8:15 PM

## 2012-03-08 NOTE — MAU Note (Signed)
Started my period on 1/28 and had a normal period. This past Monday I started bleeding again. Bleeding is not heavy - mostly see blood on tissue when wipe but not needing to wear a pad

## 2012-03-09 LAB — GC/CHLAMYDIA PROBE AMP
CT Probe RNA: NEGATIVE
GC Probe RNA: NEGATIVE

## 2012-05-22 ENCOUNTER — Encounter (HOSPITAL_COMMUNITY): Payer: Self-pay | Admitting: *Deleted

## 2012-05-22 ENCOUNTER — Inpatient Hospital Stay (HOSPITAL_COMMUNITY)
Admission: AD | Admit: 2012-05-22 | Discharge: 2012-05-22 | Disposition: A | Discharge status: Home / Self Care | Payer: Medicaid Other | Source: Ambulatory Visit | Attending: Obstetrics & Gynecology | Admitting: Obstetrics & Gynecology

## 2012-05-22 DIAGNOSIS — A499 Bacterial infection, unspecified: Secondary | ICD-10-CM

## 2012-05-22 DIAGNOSIS — N760 Acute vaginitis: Secondary | ICD-10-CM | POA: Insufficient documentation

## 2012-05-22 DIAGNOSIS — B9689 Other specified bacterial agents as the cause of diseases classified elsewhere: Secondary | ICD-10-CM

## 2012-05-22 DIAGNOSIS — N949 Unspecified condition associated with female genital organs and menstrual cycle: Secondary | ICD-10-CM | POA: Insufficient documentation

## 2012-05-22 HISTORY — DX: Chlamydial infection, unspecified: A74.9

## 2012-05-22 HISTORY — DX: Other specified bacterial agents as the cause of diseases classified elsewhere: B96.89

## 2012-05-22 HISTORY — DX: Urinary tract infection, site not specified: N39.0

## 2012-05-22 HISTORY — DX: Other specified bacterial agents as the cause of diseases classified elsewhere: N76.0

## 2012-05-22 LAB — WET PREP, GENITAL
Trich, Wet Prep: NONE SEEN
Yeast Wet Prep HPF POC: NONE SEEN

## 2012-05-22 MED ORDER — METRONIDAZOLE 500 MG PO TABS: 500.0000 mg | ORAL_TABLET | Freq: Two times a day (BID) | ORAL | Status: DC

## 2012-05-22 NOTE — MAU Note (Signed)
Patient states she has light spotting when wiping with tissue. Denies pain.

## 2012-05-22 NOTE — MAU Provider Note (Signed)
History     CSN: [id number]  Arrival date and time: 05/22/12 1133   First Provider Initiated Contact with Patient 05/22/12 1217      Chief Complaint  Patient presents with  . Vaginal Discharge   HPI [name] is a 27 y.o. [id number] who presents to MAU today with spotting x 2 days. The patient denies other vaginal discharge, UTI symptoms, fever, abdominal pain. She has had a BTL. LMP was 05/05/12. She has a history of irregular periods.   OB History   Grav Para Term Preterm Abortions TAB SAB Ect Mult Living   2 2 1 1  0 0 0 0 0 2      Past Medical History  Diagnosis Date  . Asthma   . Obesity   . Adopted   . Depression   . Chlamydia   . Bacterial vaginosis   . Urinary tract infection     Past Surgical History  Procedure Laterality Date  . Cesarean section    . Cesarean section with bilateral tubal ligation      Family History  Problem Relation Age of Onset  . Adopted: Yes  . Hypertension Mother   . Diabetes Father     History  Substance Use Topics  . Smoking status: Never Smoker   . Smokeless tobacco: Former Neurosurgeon    Quit date: 03/14/2011  . Alcohol Use: 1.0 oz/week    2 drink(s) per week     Comment: Social    Allergies:  Allergies  Allergen Reactions  . Peanut-Containing Drug Products Anaphylaxis, Hives and Swelling    *Throat swelling*  . Shellfish Allergy Anaphylaxis, Hives and Swelling    Throat swelling    No prescriptions prior to admission    Review of Systems  Constitutional: Negative for fever and malaise/fatigue.  Gastrointestinal: Negative for nausea, vomiting, abdominal pain, diarrhea and constipation.  Genitourinary: Negative for dysuria, urgency and frequency.       + vaginal bleeding Neg - vaginal discharge   Physical Exam   Blood pressure 128/88, pulse 84, temperature 98.6 F (37 C), temperature source Oral, resp. rate 18, height 5' 3.75" (1.619 m), weight 227 lb 3.2 oz (103.057 kg), last menstrual period 04/10/2012,  SpO2 100.00%.  Physical Exam  Constitutional: She is oriented to person, place, and time. She appears well-developed and well-nourished. No distress.  HENT:  Head: Normocephalic and atraumatic.  Cardiovascular: Normal rate, regular rhythm and normal heart sounds.   Respiratory: Effort normal and breath sounds normal. No respiratory distress.  GI: Soft. Bowel sounds are normal. She exhibits no distension and no mass. There is no tenderness. There is no rebound and no guarding.  Genitourinary: Uterus normal. Uterus is not enlarged and not tender. Cervix exhibits no motion tenderness, no discharge and no friability. Right adnexum displays no mass and no tenderness. Left adnexum displays no mass and no tenderness. Vaginal discharge (scant white discharge noted in the vaginal vault) found.  Neurological: She is alert and oriented to person, place, and time.  Skin: Skin is warm and dry. No erythema.  Psychiatric: She has a normal mood and affect.   Results for orders placed during the hospital encounter of 05/22/12 (from the past 24 hour(s))  WET PREP, GENITAL     Status: Abnormal   Collection Time    05/22/12 12:23 PM      Result Value Range   Yeast Wet Prep HPF POC NONE SEEN  NONE SEEN   Trich, Wet Prep NONE SEEN  NONE SEEN   Clue Cells Wet Prep HPF POC FEW (*) NONE SEEN   WBC, Wet Prep HPF POC FEW (*) NONE SEEN     MAU Course  Procedures None  MDM Wet prep, GC/Chlamydia today  Assessment and Plan  A: Bacterial vaginosis  P: Discharge home  Rx for Flagyl sent to patient's pharmacy Discussed hygiene products and probiotics for avoiding recurrent BV Patient may follow-up with Tamela Oddi as needed Patient may return to MAU as needed or if her condition were to change or worsen   Freddi Starr, PA-C  05/22/2012, 3:22 PM

## 2012-05-22 NOTE — MAU Note (Signed)
Irreg bleeding. Thinks she has BV. No odor.

## 2012-05-23 LAB — GC/CHLAMYDIA PROBE AMP
CT Probe RNA: NEGATIVE
GC Probe RNA: NEGATIVE

## 2012-08-23 ENCOUNTER — Inpatient Hospital Stay (HOSPITAL_COMMUNITY)
Admission: AD | Admit: 2012-08-23 | Discharge: 2012-08-23 | Disposition: A | Discharge status: Home / Self Care | Payer: Medicaid Other | Source: Ambulatory Visit | Attending: Obstetrics & Gynecology | Admitting: Obstetrics & Gynecology

## 2012-08-23 ENCOUNTER — Emergency Department (HOSPITAL_COMMUNITY)
Admission: EM | Admit: 2012-08-23 | Discharge: 2012-08-23 | Disposition: A | Discharge status: Home / Self Care | Payer: Medicaid Other | Attending: Emergency Medicine | Admitting: Emergency Medicine

## 2012-08-23 ENCOUNTER — Encounter (HOSPITAL_COMMUNITY): Payer: Self-pay | Admitting: Emergency Medicine

## 2012-08-23 DIAGNOSIS — J45909 Unspecified asthma, uncomplicated: Secondary | ICD-10-CM | POA: Insufficient documentation

## 2012-08-23 DIAGNOSIS — L0231 Cutaneous abscess of buttock: Secondary | ICD-10-CM | POA: Insufficient documentation

## 2012-08-23 DIAGNOSIS — Z8742 Personal history of other diseases of the female genital tract: Secondary | ICD-10-CM | POA: Insufficient documentation

## 2012-08-23 DIAGNOSIS — Z79899 Other long term (current) drug therapy: Secondary | ICD-10-CM | POA: Insufficient documentation

## 2012-08-23 DIAGNOSIS — E669 Obesity, unspecified: Secondary | ICD-10-CM | POA: Insufficient documentation

## 2012-08-23 DIAGNOSIS — L0291 Cutaneous abscess, unspecified: Secondary | ICD-10-CM

## 2012-08-23 DIAGNOSIS — Z87440 Personal history of urinary (tract) infections: Secondary | ICD-10-CM | POA: Insufficient documentation

## 2012-08-23 DIAGNOSIS — Z8619 Personal history of other infectious and parasitic diseases: Secondary | ICD-10-CM | POA: Insufficient documentation

## 2012-08-23 DIAGNOSIS — Z8659 Personal history of other mental and behavioral disorders: Secondary | ICD-10-CM | POA: Insufficient documentation

## 2012-08-23 MED ORDER — HYDROCODONE-ACETAMINOPHEN 5-325 MG PO TABS: 1.0000 | ORAL_TABLET | Freq: Four times a day (QID) | ORAL | Status: DC | PRN

## 2012-08-23 MED ORDER — ACETAMINOPHEN 325 MG PO TABS
650.0000 mg | ORAL_TABLET | Freq: Once | ORAL | Status: AC
  Administered 2012-08-23: 650 mg via ORAL
  Filled 2012-08-23: qty 2

## 2012-08-23 MED ORDER — SULFAMETHOXAZOLE-TRIMETHOPRIM 800-160 MG PO TABS: 1.0000 | ORAL_TABLET | Freq: Two times a day (BID) | ORAL | Status: DC

## 2012-08-23 NOTE — ED Provider Notes (Signed)
CSN: [id number]     Arrival date & time 08/23/12  2038 History    This chart was scribed for a non-physician practitioner, Roxy Horseman, PA-C, working with Rolan Bucco, MD by Frederik Pear, ED Scribe. This patient was seen in room WTR5/WTR5 and the patient's care was started at 2056.   First MD Initiated Contact with Patient 08/23/12 2056     No chief complaint on file.  (Consider location/radiation/quality/duration/timing/severity/associated sxs/prior Treatment) The history is provided by the patient and medical records. No language interpreter was used.    HPI Comments: Jill Summers is a 27 y.o. female who presents to the Emergency Department complaining of a worsening abscess on the posterior aspect of her left buttock that is inferior to her buttock that she first noticed yesterday. She reports the abscess started off like an ingrown hair. She denies associated symptoms including fever, nausea, emesis, and diarrhea as well as any other symptoms. Her mother recommended she apply pig fat to the area, which she attempted with no relief. She also applied a warm compress, which allowed the area to come to a head. She denies a h/o of similar.   Past Medical History  Diagnosis Date  . Asthma   . Obesity   . Adopted   . Depression   . Chlamydia   . Bacterial vaginosis   . Urinary tract infection    Past Surgical History  Procedure Laterality Date  . Cesarean section    . Cesarean section with bilateral tubal ligation     Family History  Problem Relation Age of Onset  . Adopted: Yes  . Hypertension Mother   . Diabetes Father    History  Substance Use Topics  . Smoking status: Never Smoker   . Smokeless tobacco: Former Neurosurgeon    Quit date: 03/14/2011  . Alcohol Use: 1.0 oz/week    2 drink(s) per week     Comment: Social   OB History   Grav Para Term Preterm Abortions TAB SAB Ect Mult Living   2 2 1 1  0 0 0 0 0 2     Review of Systems A complete 10 system review of  systems was obtained and all systems are negative except as noted in the HPI and PMH.  Allergies  Peanut-containing drug products and Shellfish allergy  Home Medications   Current Outpatient Rx  Name  Route  Sig  Dispense  Refill  . albuterol (PROVENTIL HFA;VENTOLIN HFA) 108 (90 BASE) MCG/ACT inhaler   Inhalation   Inhale 2 puffs into the lungs every 6 (six) hours as needed for wheezing or shortness of breath. For asthma         . Aspirin-Salicylamide-Caffeine (BC HEADACHE POWDER PO)   Oral   Take 1 Package by mouth 2 (two) times daily as needed (pain).         . cetirizine (ZYRTEC) 10 MG tablet   Oral   Take 10 mg by mouth at bedtime.           BP 141/104  Pulse 97  Temp(Src) 98.4 F (36.9 C) (Oral)  Resp 20  Wt 210 lb (95.255 kg)  BMI 36.34 kg/m2  SpO2 100% Physical Exam  Nursing note and vitals reviewed. Constitutional: She is oriented to person, place, and time. She appears well-developed and well-nourished. No distress.  HENT:  Head: Normocephalic and atraumatic.  Eyes: EOM are normal. Pupils are equal, round, and reactive to light.  Neck: Normal range of motion. Neck supple. No  tracheal deviation present.  Cardiovascular: Normal rate.   Pulmonary/Chest: Effort normal. No respiratory distress.  Abdominal: Soft. She exhibits no distension.  Musculoskeletal: Normal range of motion. She exhibits no edema.  Neurological: She is alert and oriented to person, place, and time.  Skin: Skin is warm and dry.  2x2 abscess on the posterior aspect of the left thigh that is  inferior to the buttock. Mildly indurated. No evidence of cellulitis. No fluctuant.   Psychiatric: She has a normal mood and affect. Her behavior is normal.   ED Course   Procedures (including critical care time)  INCISION AND DRAINAGE Performed by: Roxy Horseman, PA-C Consent: Verbal consent obtained. Risks and benefits: risks, benefits and alternatives were discussed Type: abscess  Body  area: posterior aspect of the left thigh that is  inferior to the buttock  Anesthesia: local infiltration  Incision was made with a scalpel.  Local anesthetic: lidocaine 1% with epinephrine  Anesthetic total: 4 mL  Complexity: simple Blunt dissection to break up loculations  Drainage: purulent  Drainage amount: moderate  Patient tolerance: Patient tolerated the procedure well with no immediate complications.   DIAGNOSTIC STUDIES: Oxygen Saturation is 100% on room air, normal by my interpretation.    COORDINATION OF CARE:  21:28- Discussed planned course of treatment with the patient, including an I&D and Tylenol, who is agreeable at this time.  22:08- Performed the I&D, which the pt tolerated without complications. Discussed home treatment plan, which includes changing the dressing twice a day for the next week, Vicodin for pain control, and applying warm compresses. The pt is agreeable at this time.   Labs Reviewed - No data to display No results found. 1. Abscess     MDM  Patient with skin abscess amenable to incision and drainage.  Abscess was not large enough to warrant packing or drain,  wound recheck in 2 days. Encouraged home warm soaks and flushing.  Mild signs of cellulitis is surrounding skin.  Will d/c to home.  I personally performed the services described in this documentation, which was scribed in my presence. The recorded information has been reviewed and is accurate.     Roxy Horseman, PA-C 08/23/12 2242

## 2012-08-23 NOTE — MAU Note (Signed)
Pt went to [facility] ED per [facility] calling for [place] to take pt out of system

## 2012-08-23 NOTE — ED Notes (Signed)
Pt has abscess on right lower buttocks. Pt has had pain since yesterday. Pt unable to sit on that side. Pt complain of pain 8/10. Pt states it started as a pimple. Pt placed warm compress and area became worse

## 2012-08-23 NOTE — ED Provider Notes (Signed)
Medical screening examination/treatment/procedure(s) were performed by non-physician practitioner and as supervising physician I was immediately available for consultation/collaboration.   Rolan Bucco, MD 08/23/12 [phone]

## 2012-08-23 NOTE — MAU Note (Signed)
Called pt and not in lobby. Walked outside of MAU and pt not seen outside MAU.

## 2013-01-19 ENCOUNTER — Inpatient Hospital Stay (HOSPITAL_COMMUNITY)
Admission: AD | Admit: 2013-01-19 | Discharge: 2013-01-19 | Disposition: A | Discharge status: Home / Self Care | Payer: Medicaid Other | Source: Ambulatory Visit | Attending: Obstetrics | Admitting: Obstetrics

## 2013-01-19 ENCOUNTER — Encounter (HOSPITAL_COMMUNITY): Payer: Self-pay

## 2013-01-19 DIAGNOSIS — A5909 Other urogenital trichomoniasis: Secondary | ICD-10-CM

## 2013-01-19 DIAGNOSIS — A5901 Trichomonal vulvovaginitis: Secondary | ICD-10-CM

## 2013-01-19 LAB — WET PREP, GENITAL: Yeast Wet Prep HPF POC: NONE SEEN

## 2013-01-19 LAB — URINALYSIS, ROUTINE W REFLEX MICROSCOPIC
Bilirubin Urine: NEGATIVE
Glucose, UA: NEGATIVE mg/dL
Ketones, ur: NEGATIVE mg/dL
Nitrite: NEGATIVE
Protein, ur: NEGATIVE mg/dL
Specific Gravity, Urine: >1.03 — ABNORMAL HIGH (ref 1.005–1.030)
Urobilinogen, UA: 0.2 mg/dL (ref 0.0–1.0)
pH: 5.5 (ref 5.0–8.0)

## 2013-01-19 LAB — POCT PREGNANCY, URINE: Preg Test, Ur: NEGATIVE

## 2013-01-19 LAB — URINE MICROSCOPIC-ADD ON

## 2013-01-19 MED ORDER — METRONIDAZOLE 500 MG PO TABS
2000.0000 mg | ORAL_TABLET | Freq: Once | ORAL | Status: AC
  Administered 2013-01-19: 2000 mg via ORAL
  Filled 2013-01-19: qty 4

## 2013-01-19 NOTE — MAU Provider Note (Signed)
History     CSN: [id number]  Arrival date and time: 01/19/13 2143   First Provider Initiated Contact with Patient 01/19/13 2213      Chief Complaint  Patient presents with  . Vaginal Discharge   HPI Comments: Jill Summers 27 y.o. [id number] presents to MAU with what she believes in Vaginal BV. Her sx started 4 days ago and include a watery, nonodorous discharge. She does have a new sexual partner  Vaginal Discharge The patient's primary symptoms include a vaginal discharge.      Past Medical History  Diagnosis Date  . Asthma   . Obesity   . Adopted   . Depression   . Chlamydia   . Bacterial vaginosis   . Urinary tract infection     Past Surgical History  Procedure Laterality Date  . Cesarean section    . Cesarean section with bilateral tubal ligation      Family History  Problem Relation Age of Onset  . Adopted: Yes  . Hypertension Mother   . Diabetes Father     History  Substance Use Topics  . Smoking status: Never Smoker   . Smokeless tobacco: Former Neurosurgeon    Quit date: 03/14/2011  . Alcohol Use: 1.0 oz/week    2 drink(s) per week     Comment: Social    Allergies:  Allergies  Allergen Reactions  . Peanut-Containing Drug Products Anaphylaxis, Hives and Swelling    *Throat swelling*  . Shellfish Allergy Anaphylaxis, Hives and Swelling    Throat swelling    Prescriptions prior to admission  Medication Sig Dispense Refill  . albuterol (PROVENTIL HFA;VENTOLIN HFA) 108 (90 BASE) MCG/ACT inhaler Inhale 2 puffs into the lungs every 6 (six) hours as needed for wheezing or shortness of breath. For asthma      . cetirizine (ZYRTEC) 10 MG tablet Take 10 mg by mouth at bedtime.         Review of Systems  Constitutional: Negative.   HENT: Negative.   Eyes: Negative.   Cardiovascular: Negative.   Gastrointestinal: Negative.   Genitourinary: Positive for vaginal discharge.       Vaginal discharge  Skin: Negative.   Neurological: Negative.    Psychiatric/Behavioral: Negative.    Physical Exam   Blood pressure 123/62, pulse 89, temperature 98.2 F (36.8 C), temperature source Oral, resp. rate 18, height 5\' 2"  (1.575 m), weight 108.591 kg (239 lb 6.4 oz), last menstrual period 12/24/2012.  Physical Exam  Constitutional: She is oriented to person, place, and time. She appears well-developed and well-nourished. No distress.  HENT:  Head: Normocephalic and atraumatic.  GI: Soft. Bowel sounds are normal. She exhibits no distension. There is no tenderness. There is no rebound.  Genitourinary:  Genital: External labia there are breaks in skin / area shaved, nontender Vag: thin watery green discharge Cervix: negative Biman: negatibe  Musculoskeletal: Normal range of motion.  Neurological: She is alert and oriented to person, place, and time.  Skin: Skin is warm.  Psychiatric: She has a normal mood and affect. Her behavior is normal. Judgment and thought content normal.   Results for orders placed during the hospital encounter of 01/19/13 (from the past 24 hour(s))  URINALYSIS, ROUTINE W REFLEX MICROSCOPIC     Status: Abnormal   Collection Time    01/19/13  9:52 PM      Result Value Range   Color, Urine YELLOW  YELLOW   APPearance CLOUDY (*) CLEAR   Specific Gravity, Urine >1.030 (*)  1.005 - 1.030   pH 5.5  5.0 - 8.0   Glucose, UA NEGATIVE  NEGATIVE mg/dL   Hgb urine dipstick MODERATE (*) NEGATIVE   Bilirubin Urine NEGATIVE  NEGATIVE   Ketones, ur NEGATIVE  NEGATIVE mg/dL   Protein, ur NEGATIVE  NEGATIVE mg/dL   Urobilinogen, UA 0.2  0.0 - 1.0 mg/dL   Nitrite NEGATIVE  NEGATIVE   Leukocytes, UA SMALL (*) NEGATIVE  URINE MICROSCOPIC-ADD ON     Status: Abnormal   Collection Time    01/19/13  9:52 PM      Result Value Range   Squamous Epithelial / LPF FEW (*) RARE   WBC, UA 7-10  <3 WBC/hpf   RBC / HPF 3-6  <3 RBC/hpf   Bacteria, UA FEW (*) RARE   Urine-Other MUCOUS PRESENT    POCT PREGNANCY, URINE     Status: None    Collection Time    01/19/13 10:00 PM      Result Value Range   Preg Test, Ur NEGATIVE  NEGATIVE  WET PREP, GENITAL     Status: Abnormal   Collection Time    01/19/13 10:16 PM      Result Value Range   Yeast Wet Prep HPF POC NONE SEEN  NONE SEEN   Trich, Wet Prep FEW (*) NONE SEEN   Clue Cells Wet Prep HPF POC FEW (*) NONE SEEN   WBC, Wet Prep HPF POC FEW (*) NONE SEEN     MAU Course  Procedures  MDM  Wet prep, GC, Chlamydia, HSV  Assessment and Plan   A: Vaginal Trich  P: Flagyl 2 Grams po now No alcohol for 24 hours Refer partner to [facility] Will call with remainer of results   Carolynn Serve 01/19/2013, 11:44 PM

## 2013-01-19 NOTE — Discharge Instructions (Signed)
Trichomoniasis Trichomoniasis is an infection, caused by the Trichomonas organism, that affects both women and men. In women, the outer female genitalia and the vagina are affected. In men, the penis is mainly affected, but the prostate and other reproductive organs can also be involved. Trichomoniasis is a sexually transmitted disease (STD) and is most often passed to another person through sexual contact. The majority of people who get trichomoniasis do so from a sexual encounter and are also at risk for other STDs. CAUSES   Sexual intercourse with an infected partner.  It can be present in swimming pools or hot tubs. SYMPTOMS   Abnormal gray-green frothy vaginal discharge in women.  Vaginal itching and irritation in women.  Itching and irritation of the area outside the vagina in women.  Penile discharge with or without pain in males.  Inflammation of the urethra (urethritis), causing painful urination.  Bleeding after sexual intercourse. RELATED COMPLICATIONS  Pelvic inflammatory disease.  Infection of the uterus (endometritis).  Infertility.  Tubal (ectopic) pregnancy.  It can be associated with other STDs, including gonorrhea and chlamydia, hepatitis B, and HIV. COMPLICATIONS DURING PREGNANCY  Early (premature) delivery.  Premature rupture of the membranes (PROM).  Low birth weight. DIAGNOSIS   Visualization of Trichomonas under the microscope from the vagina discharge.  Ph of the vagina greater than 4.5, tested with a test tape.  Trich Rapid Test.  Culture of the organism, but this is not usually needed.  It may be found on a Pap test.  Having a "strawberry cervix,"which means the cervix looks very red like a strawberry. TREATMENT   You may be given medication to fight the infection. Inform your caregiver if you could be or are pregnant. Some medications used to treat the infection should not be taken during pregnancy.  Over-the-counter medications or  creams to decrease itching or irritation may be recommended.  Your sexual partner will need to be treated if infected. HOME CARE INSTRUCTIONS   Take all medication prescribed by your caregiver.  Take over-the-counter medication for itching or irritation as directed by your caregiver.  Do not have sexual intercourse while you have the infection.  Do not douche or wear tampons.  Discuss your infection with your partner, as your partner may have acquired the infection from you. Or, your partner may have been the person who transmitted the infection to you.  Have your sex partner examined and treated if necessary.  Practice safe, informed, and protected sex.  See your caregiver for other STD testing. SEEK MEDICAL CARE IF:   You still have symptoms after you finish the medication.  You have an oral temperature above 102 F (38.9 C).  You develop belly (abdominal) pain.  You have pain when you urinate.  You have bleeding after sexual intercourse.  You develop a rash.  The medication makes you sick or makes you throw up (vomit). Document Released: 07/12/2000 Document Revised: 04/10/2011 Document Reviewed: 08/07/2008 [facility] Patient Information 2014 [place], [place].  Sexually Transmitted Disease A sexually transmitted disease (STD) is an infection that is passed from person to person during sexual activity. STDs can be spread by different types of germs (bacteria, viruses, parasites). An STD can be passed through:  Spit (saliva).  Semen.  Blood.  Mucus from the vagina.  Pee (urine). HOME CARE   Tell your sex partner(s) that you have an STD. They should be tested and treated.  Take your medicine (antibiotics) as told. Finish them even if you start to feel better.  Only take medicines as told by your doctor.  Rest.  Eat a healthy diet. Drink enough fluids to keep your pee clear or pale yellow.  Do not have sex until treatment is finished. You must follow up with  your doctor.  Keep all doctor visits, Pap tests, and blood tests as told by your doctor.  Only use condoms labeled "latex" and lubricants that wash away with water (water-soluble). Do not use petroleum jelly or oils.  Avoid alcohol and illegal drugs.  Get shots (vaccines) for HPV and hepatitis.  Avoid risky sex behavior that can break the skin. GET HELP RIGHT AWAY IF:  You have a fever.  You have new problems, or your problems get worse. MAKE SURE YOU:  Understand these instructions.  Will watch your condition.  Will get help right away if you are not doing well or get worse. Document Released: 02/24/2004 Document Revised: 04/10/2011 Document Reviewed: 07/12/2012 [facility] Patient Information 2014 [place], [place].

## 2013-01-19 NOTE — MAU Note (Signed)
Vaginal irritation x 4 days. Clear/white "wet" discharge x 4 days, no odor. Thinks has BV. Last saw [facility] 2 years ago. BTL in 2012. LMP 11/25.

## 2013-01-20 LAB — GC/CHLAMYDIA PROBE AMP
CT Probe RNA: NEGATIVE
GC Probe RNA: NEGATIVE

## 2013-01-21 LAB — URINE CULTURE: Colony Count: 9000

## 2013-01-22 LAB — HERPES SIMPLEX VIRUS CULTURE
Culture: NOT DETECTED
Special Requests: NORMAL

## 2013-05-31 ENCOUNTER — Inpatient Hospital Stay (HOSPITAL_COMMUNITY)
Admission: AD | Admit: 2013-05-31 | Discharge: 2013-05-31 | Disposition: A | Discharge status: Home / Self Care | Payer: Medicaid Other | Source: Ambulatory Visit | Attending: Obstetrics | Admitting: Obstetrics

## 2013-05-31 ENCOUNTER — Encounter (HOSPITAL_COMMUNITY): Payer: Self-pay

## 2013-05-31 DIAGNOSIS — B9689 Other specified bacterial agents as the cause of diseases classified elsewhere: Secondary | ICD-10-CM | POA: Insufficient documentation

## 2013-05-31 DIAGNOSIS — N949 Unspecified condition associated with female genital organs and menstrual cycle: Secondary | ICD-10-CM | POA: Insufficient documentation

## 2013-05-31 DIAGNOSIS — A499 Bacterial infection, unspecified: Secondary | ICD-10-CM

## 2013-05-31 DIAGNOSIS — N760 Acute vaginitis: Secondary | ICD-10-CM | POA: Insufficient documentation

## 2013-05-31 LAB — URINALYSIS, ROUTINE W REFLEX MICROSCOPIC
Bilirubin Urine: NEGATIVE
Glucose, UA: NEGATIVE mg/dL
Ketones, ur: NEGATIVE mg/dL
Nitrite: NEGATIVE
Protein, ur: NEGATIVE mg/dL
Specific Gravity, Urine: 1.025 (ref 1.005–1.030)
Urobilinogen, UA: 0.2 mg/dL (ref 0.0–1.0)
pH: 6 (ref 5.0–8.0)

## 2013-05-31 LAB — URINE MICROSCOPIC-ADD ON

## 2013-05-31 LAB — WET PREP, GENITAL
Trich, Wet Prep: NONE SEEN
Yeast Wet Prep HPF POC: NONE SEEN

## 2013-05-31 LAB — POCT PREGNANCY, URINE: Preg Test, Ur: NEGATIVE

## 2013-05-31 MED ORDER — METRONIDAZOLE 500 MG PO TABS: 500.0000 mg | ORAL_TABLET | Freq: Two times a day (BID) | ORAL | Status: DC

## 2013-05-31 NOTE — MAU Note (Signed)
Having a lot of vag irritation for 3 days. Unsure if is yeast or BV. Some vag d/c

## 2013-05-31 NOTE — Discharge Instructions (Signed)
Bacterial Vaginosis Bacterial vaginosis is an infection of the vagina. It happens when too many of certain germs (bacteria) grow in the vagina. HOME CARE  Take your medicine as told by your doctor.  Finish your medicine even if you start to feel better.  Do not have sex until you finish your medicine and are better.  Tell your sex partner that you have an infection. They should see their doctor for treatment.  Practice safe sex. Use condoms. Have only one sex partner. GET HELP IF:  You are not getting better after 3 days of treatment.  You have more grey fluid (discharge) coming from your vagina than before.  You have more pain than before.  You have a fever. MAKE SURE YOU:   Understand these instructions.  Will watch your condition.  Will get help right away if you are not doing well or get worse. Document Released: 10/26/2007 Document Revised: 11/06/2012 Document Reviewed: 08/28/2012 [employer] Patient Information 2014 [employer].  

## 2013-05-31 NOTE — MAU Provider Note (Signed)
History     CSN: [id number]  Arrival date and time: 05/31/13 [id number]   First Provider Initiated Contact with Patient 05/31/13 [phone]      Chief Complaint  Patient presents with  . Vaginal Pain   HPI [name] is a 28 y.o. [id number] who presents to MAU today with complaint of vaginal irritation and discharge x 2-3 days. The patient states discharge is thin, white with slight odor. She denies pelvic pain, vaginal bleeding, fever, UTI symptoms or N/V/D. She is sexually active, has had BTL and uses condoms sometimes.    OB History   Grav Para Term Preterm Abortions TAB SAB Ect Mult Living   2 2 1 1  0 0 0 0 0 2      Past Medical History  Diagnosis Date  . Asthma   . Obesity   . Adopted   . Depression   . Chlamydia   . Bacterial vaginosis   . Urinary tract infection     Past Surgical History  Procedure Laterality Date  . Cesarean section    . Cesarean section with bilateral tubal ligation      Family History  Problem Relation Age of Onset  . Adopted: Yes  . Hypertension Mother   . Diabetes Father     History  Substance Use Topics  . Smoking status: Never Smoker   . Smokeless tobacco: Never Used  . Alcohol Use: 1.0 oz/week    2 drink(s) per week     Comment: Social    Allergies:  Allergies  Allergen Reactions  . Peanut-Containing Drug Products Anaphylaxis, Hives and Swelling    *Throat swelling*  . Shellfish Allergy Anaphylaxis, Hives and Swelling    Throat swelling    Prescriptions prior to admission  Medication Sig Dispense Refill  . albuterol (PROVENTIL HFA;VENTOLIN HFA) 108 (90 BASE) MCG/ACT inhaler Inhale 2 puffs into the lungs every 6 (six) hours as needed for wheezing or shortness of breath. For asthma      . buPROPion (WELLBUTRIN XL) 150 MG 24 hr tablet Take 150 mg by mouth daily. Pt takes the 24 hour pill but unsure of mg amt.      . cetirizine (ZYRTEC) 10 MG tablet Take 10 mg by mouth at bedtime.         Review of Systems   Constitutional: Negative for fever and malaise/fatigue.  Gastrointestinal: Negative for nausea, vomiting, abdominal pain, diarrhea and constipation.  Genitourinary: Negative for dysuria, urgency and frequency.       Neg - vaginal bleeding + vaginal discharge   Physical Exam   Blood pressure 132/86, pulse 82, temperature 98.3 F (36.8 C), resp. rate 20, height 5\' 2"  (1.575 m), weight 241 lb 12.8 oz (109.68 kg), last menstrual period 05/09/2013.  Physical Exam  Constitutional: She is oriented to person, place, and time. She appears well-developed and well-nourished. No distress.  HENT:  Head: Normocephalic and atraumatic.  Cardiovascular: Normal rate.   Respiratory: Effort normal.  Neurological: She is alert and oriented to person, place, and time.  Skin: Skin is warm and dry. No erythema.  Psychiatric: She has a normal mood and affect.   Results for orders placed during the hospital encounter of 05/31/13 (from the past 24 hour(s))  URINALYSIS, ROUTINE W REFLEX MICROSCOPIC     Status: Abnormal   Collection Time    05/31/13  1:55 AM      Result Value Ref Range   Color, Urine YELLOW  YELLOW   APPearance CLEAR  CLEAR   Specific Gravity, Urine 1.025  1.005 - 1.030   pH 6.0  5.0 - 8.0   Glucose, UA NEGATIVE  NEGATIVE mg/dL   Hgb urine dipstick SMALL (*) NEGATIVE   Bilirubin Urine NEGATIVE  NEGATIVE   Ketones, ur NEGATIVE  NEGATIVE mg/dL   Protein, ur NEGATIVE  NEGATIVE mg/dL   Urobilinogen, UA 0.2  0.0 - 1.0 mg/dL   Nitrite NEGATIVE  NEGATIVE   Leukocytes, UA SMALL (*) NEGATIVE  URINE MICROSCOPIC-ADD ON     Status: Abnormal   Collection Time    05/31/13  1:55 AM      Result Value Ref Range   Squamous Epithelial / LPF FEW (*) RARE   WBC, UA 3-6  <3 WBC/hpf   RBC / HPF 0-2  <3 RBC/hpf   Bacteria, UA FEW (*) RARE   Urine-Other MUCOUS PRESENT    POCT PREGNANCY, URINE     Status: None   Collection Time    05/31/13  2:00 AM      Result Value Ref Range   Preg Test, Ur NEGATIVE   NEGATIVE  WET PREP, GENITAL     Status: Abnormal   Collection Time    05/31/13  2:30 AM      Result Value Ref Range   Yeast Wet Prep HPF POC NONE SEEN  NONE SEEN   Trich, Wet Prep NONE SEEN  NONE SEEN   Clue Cells Wet Prep HPF POC FEW (*) NONE SEEN   WBC, Wet Prep HPF POC FEW (*) NONE SEEN    MAU Course  Procedures  MDM UPT- negative UA, Wet prep and GC/chlamydia today   Assessment and Plan  A: Bacterial vaginosis  P: Discharge home Rx for Flagyl sent to patient's pharmacy Discussed hygiene products for avoiding recurrent BV Patient advised to follow-up with Dr. Clearance CootsHarper if symptoms persist or worsen GC/Chlamydia pending Patient may return to MAU as needed or if her condition were to change or worsen  Freddi StarrJulie N Ethier, PA-C  05/31/2013, 2:18 AM

## 2013-06-02 LAB — GC/CHLAMYDIA PROBE AMP
CT Probe RNA: NEGATIVE
GC Probe RNA: NEGATIVE

## 2013-07-05 ENCOUNTER — Encounter (HOSPITAL_COMMUNITY): Payer: Self-pay | Admitting: Family

## 2013-07-05 ENCOUNTER — Inpatient Hospital Stay (HOSPITAL_COMMUNITY)
Admission: AD | Admit: 2013-07-05 | Discharge: 2013-07-05 | Disposition: A | Discharge status: Home / Self Care | Payer: Medicaid Other | Source: Ambulatory Visit | Attending: Obstetrics | Admitting: Obstetrics

## 2013-07-05 DIAGNOSIS — L0293 Carbuncle, unspecified: Principal | ICD-10-CM

## 2013-07-05 DIAGNOSIS — L0292 Furuncle, unspecified: Secondary | ICD-10-CM | POA: Insufficient documentation

## 2013-07-05 DIAGNOSIS — L02429 Furuncle of limb, unspecified: Secondary | ICD-10-CM

## 2013-07-05 DIAGNOSIS — Z22322 Carrier or suspected carrier of Methicillin resistant Staphylococcus aureus: Secondary | ICD-10-CM

## 2013-07-05 MED ORDER — SULFAMETHOXAZOLE-TRIMETHOPRIM 800-160 MG PO TABS: 1.0000 | ORAL_TABLET | Freq: Two times a day (BID) | ORAL | Status: DC

## 2013-07-05 NOTE — MAU Provider Note (Addendum)
CC: Recurrent Skin Infections    Provider at bedside 1410     HPI Jill Summers is a 28 y.o. [id number]  who presents with onset yesterday of painful lump left inner thigh. Used hot soaks but area enlarging. Now has come to a head. No other lesions. No similar episodes. No systemic symptoms.    Past Medical History  Diagnosis Date  . Asthma   . Obesity   . Adopted   . Depression   . Chlamydia   . Bacterial vaginosis   . Urinary tract infection     OB History  Gravida Para Term Preterm AB SAB TAB Ectopic Multiple Living  2 2 1 1  0 0 0 0 0 2    # Outcome Date GA Lbr Len/2nd Weight Sex Delivery Anes PTL Lv  2 PRE  [redacted]w[redacted]d    LTCS   Y  1 TRM      LTCS   Y      Past Surgical History  Procedure Laterality Date  . Cesarean section    . Cesarean section with bilateral tubal ligation      History   Social History  . Marital Status: Single    Spouse Name: N/A    Number of Children: N/A  . Years of Education: N/A   Occupational History  . Not on file.   Social History Main Topics  . Smoking status: Never Smoker   . Smokeless tobacco: Never Used  . Alcohol Use: 1.0 oz/week    2 drink(s) per week     Comment: Social  . Drug Use: No  . Sexual Activity: Yes    Birth Control/ Protection: Surgical   Other Topics Concern  . Not on file   Social History Narrative  . No narrative on file    No current facility-administered medications on file prior to encounter.   Current Outpatient Prescriptions on File Prior to Encounter  Medication Sig Dispense Refill  . albuterol (PROVENTIL HFA;VENTOLIN HFA) 108 (90 BASE) MCG/ACT inhaler Inhale 2 puffs into the lungs every 6 (six) hours as needed for wheezing or shortness of breath. For asthma      . buPROPion (WELLBUTRIN XL) 150 MG 24 hr tablet Take 150 mg by mouth daily. Pt takes the 24 hour pill but unsure of mg amt.      . cetirizine (ZYRTEC) 10 MG tablet Take 10 mg by mouth at bedtime.       . metroNIDAZOLE (FLAGYL) 500 MG  tablet Take 1 tablet (500 mg total) by mouth 2 (two) times daily.  14 tablet  0    Allergies  Allergen Reactions  . Peanut-Containing Drug Products Anaphylaxis, Hives and Swelling    *Throat swelling*  . Shellfish Allergy Anaphylaxis, Hives and Swelling    Throat swelling    ROS Pertinent items in HPI  PHYSICAL EXAM Filed Vitals:   07/05/13 1351  BP: 125/82  Pulse: 84  Temp: 99.3 F (37.4 C)  Resp: 18   General: Well nourished, well developed female in no acute distress Cardiovascular: Normal rate Respiratory: Normal effort Abdomen: Soft, nontender Back: No CVAT Extremities: Left inner thigh with tender 5cm nodular area surmounted by 1 cm fluctuant pustule. No streaking or lymphadenopathy.    MAU COURSE  I&D Procedure:  Area as described above cleansed with Betadine, sterile drapes applied; infiltrated with 3cc 1% xylocaine. Centrally lanced 2-3 mm with scalpel> moderate amount pus and serosanguinous drainage extruded. Induration about 1 cm remained. Explored with hemostat tip>  scant sanguinous pus. . 2x2's and nonadherent dressing applied Culture sent  ASSESSMENT  1. Furuncle of thigh     PLAN C/W Dr. Clearance CootsHarper Discharge home. See AVS for patient education. Continue soaks with warm water, Epsom salts.    Medication List    STOP taking these medications       metroNIDAZOLE 500 MG tablet  Commonly known as:  FLAGYL      TAKE these medications       albuterol 108 (90 BASE) MCG/ACT inhaler  Commonly known as:  PROVENTIL HFA;VENTOLIN HFA  Inhale 2 puffs into the lungs every 6 (six) hours as needed for wheezing or shortness of breath. For asthma     buPROPion 150 MG 24 hr tablet  Commonly known as:  WELLBUTRIN XL  Take 150 mg by mouth daily. Pt takes the 24 hour pill but unsure of mg amt.     cetirizine 10 MG tablet  Commonly known as:  ZYRTEC  Take 10 mg by mouth at bedtime.     sulfamethoxazole-trimethoprim 800-160 MG per tablet  Commonly known as:   BACTRIM DS  Take 1 tablet by mouth 2 (two) times daily.        Follow-up Information   Follow up with HARPER,CHARLES A, MD. Schedule an appointment as soon as possible for a visit in 2 weeks. (If symptoms worsen, For wound re-check)    Specialty:  Obstetrics and Gynecology   Contact information:   [address] [place] [address] [phone]      Follow-up Information   Follow up with HARPER,CHARLES A, MD. Schedule an appointment as soon as possible for a visit in 2 weeks. (If symptoms worsen, For wound re-check)    Specialty:  Obstetrics and Gynecology   Contact information:   [address] [place] [address] [phone]      Danae OrleansDeirdre C Iisha Summers, CNM 07/05/2013 2:08 PM     Culture pos for MRSA Jill Summers, CNM 07/27/2013 10:12 AM

## 2013-07-05 NOTE — Discharge Instructions (Signed)
Abscess  Care After  An abscess (also called a boil or furuncle) is an infected area that contains a collection of pus. Signs and symptoms of an abscess include pain, tenderness, redness, or hardness, or you may feel a moveable soft area under your skin. An abscess can occur anywhere in the body. The infection may spread to surrounding tissues causing cellulitis. A cut (incision) by the surgeon was made over your abscess and the pus was drained out. Gauze may have been packed into the space to provide a drain that will allow the cavity to heal from the inside outwards. The boil may be painful for 5 to 7 days. Most people with a boil do not have high fevers. Your abscess, if seen early, may not have localized, and may not have been lanced. If not, another appointment may be required for this if it does not get better on its own or with medications.  HOME CARE INSTRUCTIONS   · Only take over-the-counter or prescription medicines for pain, discomfort, or fever as directed by your caregiver.  · When you bathe, soak and then remove gauze or iodoform packs at least daily or as directed by your caregiver. You may then wash the wound gently with mild soapy water. Repack with gauze or do as your caregiver directs.  SEEK IMMEDIATE MEDICAL CARE IF:   · You develop increased pain, swelling, redness, drainage, or bleeding in the wound site.  · You develop signs of generalized infection including muscle aches, chills, fever, or a general ill feeling.  · An oral temperature above 102° F (38.9° C) develops, not controlled by medication.  See your caregiver for a recheck if you develop any of the symptoms described above. If medications (antibiotics) were prescribed, take them as directed.  Document Released: 08/04/2004 Document Revised: 04/10/2011 Document Reviewed: 04/01/2007  Exit[employer]® Patient Information ©2014 [employer].

## 2013-07-05 NOTE — MAU Note (Signed)
Boil came up yesterday. Is on left inner thigh.  Has been doing hot water soaks.  Put fatback on it.  Came to a head- but won't pop.

## 2013-07-05 NOTE — MAU Note (Signed)
28 yo presents with c/o abscess on inner L thigh x 2 days. Denies fevers, chills, sweats. She had similar issue several years ago and the abscess was treated by I&D without incident.

## 2013-07-18 LAB — WOUND CULTURE
Gram Stain: NONE SEEN
Special Requests: NORMAL

## 2013-07-27 DIAGNOSIS — Z22322 Carrier or suspected carrier of Methicillin resistant Staphylococcus aureus: Secondary | ICD-10-CM

## 2013-07-27 HISTORY — DX: Carrier or suspected carrier of methicillin resistant Staphylococcus aureus: Z22.322

## 2013-09-19 ENCOUNTER — Inpatient Hospital Stay (HOSPITAL_COMMUNITY)
Admission: AD | Admit: 2013-09-19 | Discharge: 2013-09-19 | Disposition: A | Discharge status: Home / Self Care | Payer: Medicaid Other | Source: Ambulatory Visit | Attending: Obstetrics & Gynecology | Admitting: Obstetrics & Gynecology

## 2013-09-19 ENCOUNTER — Emergency Department (HOSPITAL_COMMUNITY)
Admission: EM | Admit: 2013-09-19 | Discharge: 2013-09-19 | Discharge status: Against Medical Advice | Payer: Medicaid Other | Attending: Emergency Medicine | Admitting: Emergency Medicine

## 2013-09-19 ENCOUNTER — Encounter (HOSPITAL_COMMUNITY): Payer: Self-pay | Admitting: Emergency Medicine

## 2013-09-19 ENCOUNTER — Emergency Department (HOSPITAL_COMMUNITY)
Admission: EM | Admit: 2013-09-19 | Discharge: 2013-09-19 | Disposition: A | Discharge status: Nursing Home (Includes ICF) | Payer: Medicaid Other | Source: Home / Self Care | Attending: Family Medicine | Admitting: Family Medicine

## 2013-09-19 DIAGNOSIS — E669 Obesity, unspecified: Secondary | ICD-10-CM | POA: Diagnosis not present

## 2013-09-19 DIAGNOSIS — J45909 Unspecified asthma, uncomplicated: Secondary | ICD-10-CM | POA: Diagnosis not present

## 2013-09-19 DIAGNOSIS — R51 Headache: Secondary | ICD-10-CM | POA: Diagnosis present

## 2013-09-19 DIAGNOSIS — H538 Other visual disturbances: Secondary | ICD-10-CM

## 2013-09-19 DIAGNOSIS — M545 Low back pain, unspecified: Secondary | ICD-10-CM | POA: Insufficient documentation

## 2013-09-19 DIAGNOSIS — R519 Headache, unspecified: Secondary | ICD-10-CM

## 2013-09-19 LAB — POC URINE PREG, ED: Preg Test, Ur: NEGATIVE

## 2013-09-19 LAB — URINE MICROSCOPIC-ADD ON

## 2013-09-19 LAB — URINALYSIS, ROUTINE W REFLEX MICROSCOPIC
Bilirubin Urine: NEGATIVE
Glucose, UA: NEGATIVE mg/dL
Ketones, ur: 15 mg/dL — AB
Nitrite: NEGATIVE
Protein, ur: NEGATIVE mg/dL
Specific Gravity, Urine: 1.029 (ref 1.005–1.030)
Urobilinogen, UA: 1 mg/dL (ref 0.0–1.0)
pH: 6 (ref 5.0–8.0)

## 2013-09-19 NOTE — ED Notes (Signed)
C/o back pain and blurred vision

## 2013-09-19 NOTE — ED Notes (Signed)
Pt states she is not staying any longer.  [facility] and [facility]  Explained triage process to pt and encouraged pt to stay.  Pt declines.

## 2013-09-19 NOTE — ED Notes (Signed)
Evaluated by dr Denyse Amasscorey prior to nurse

## 2013-09-19 NOTE — MAU Note (Signed)
NOT IN LOBBY 

## 2013-09-19 NOTE — MAU Note (Signed)
Pt reports she has had back shoulder and head pain since yesterday. Went to urgent care and was [facility] and she sat fer several hours and left to come here.

## 2013-09-19 NOTE — ED Notes (Signed)
Pt c/o lower back pain and HA; [facility]; pt sts some blurry vision

## 2013-09-19 NOTE — ED Provider Notes (Addendum)
Jill Summers is a 28 y.o. female who presents to Urgent Care today for severe headache back pain and blurry vision. Patient notes sudden onset of the worst headache of her life yesterday morning. This is associated with back and neck pain and new onset of blurry vision. She has never had anything like this happen before. She's tried Tylenol which has not helped. She denies any weakness or numbness bowel bladder dysfunction or difficulty walking.   Past Medical History  Diagnosis Date  . Asthma   . Obesity   . Adopted   . Depression   . Chlamydia   . Bacterial vaginosis   . Urinary tract infection    History  Substance Use Topics  . Smoking status: Never Smoker   . Smokeless tobacco: Never Used  . Alcohol Use: 1.0 oz/week    2 drink(s) per week     Comment: Social   ROS as above Medications: No current facility-administered medications for this encounter.   Current Outpatient Prescriptions  Medication Sig Dispense Refill  . albuterol (PROVENTIL HFA;VENTOLIN HFA) 108 (90 BASE) MCG/ACT inhaler Inhale 2 puffs into the lungs every 6 (six) hours as needed for wheezing or shortness of breath. For asthma      . buPROPion (WELLBUTRIN XL) 150 MG 24 hr tablet Take 150 mg by mouth daily. Pt takes the 24 hour pill but unsure of mg amt.      . cetirizine (ZYRTEC) 10 MG tablet Take 10 mg by mouth at bedtime.       . sulfamethoxazole-trimethoprim (BACTRIM DS) 800-160 MG per tablet Take 1 tablet by mouth 2 (two) times daily.  28 tablet  0    Exam:  BP 123/84  Pulse 95  Temp(Src) 99 F (37.2 C) (Oral)  Resp 16  SpO2 100%  LMP 09/11/2013 Gen: Well NAD HEENT: EOMI,  MMM PERRLA. Funduscopic exam is normal bilaterally. Lungs: Normal work of breathing. CTABL Heart: RRR no MRG Abd: NABS, Soft. Nondistended, Nontender Exts: Brisk capillary refill, warm and well perfused.  Neuro: Alert and oriented cranial nerves intact normal coordination gait and balance. Back: Nontender spinal midline.  Normal neck range of motion. Normal back range of motion  Visual Acuity:  Right Eye Distance: 20/50 Left Eye Distance: 20/70 Bilateral Distance: 20/70   No results found for this or any previous visit (from the past 24 hour(s)). No results found.  Assessment and Plan: 28 y.o. female with severe headache back pain and blurry vision unclear etiology. Somewhat concerning for subarachnoid hemorrhage. We do not have a good explanation for her new onset of blurry vision.. She denies any history of previous blurry vision. Plan to transfer to the emergency department for evaluation and management.  Discussed warning signs or symptoms. Please see discharge instructions. Patient expresses understanding.   This note was created using Conservation officer, historic buildingsDragon voice recognition software. Any transcription errors are unintended.    Rodolph BongEvan S Vidya Bamford, MD 09/19/13 1635  Rodolph BongEvan S Kanyon Bunn, MD 09/19/13 [phone]

## 2013-09-20 ENCOUNTER — Encounter (HOSPITAL_COMMUNITY): Payer: Self-pay | Admitting: Emergency Medicine

## 2013-09-20 ENCOUNTER — Emergency Department (HOSPITAL_COMMUNITY)
Admission: EM | Admit: 2013-09-20 | Discharge: 2013-09-21 | Disposition: A | Discharge status: Home / Self Care | Payer: Medicaid Other | Attending: Emergency Medicine | Admitting: Emergency Medicine

## 2013-09-20 DIAGNOSIS — M549 Dorsalgia, unspecified: Secondary | ICD-10-CM | POA: Diagnosis present

## 2013-09-20 DIAGNOSIS — M546 Pain in thoracic spine: Secondary | ICD-10-CM | POA: Insufficient documentation

## 2013-09-20 DIAGNOSIS — Z8742 Personal history of other diseases of the female genital tract: Secondary | ICD-10-CM | POA: Diagnosis not present

## 2013-09-20 DIAGNOSIS — Z79899 Other long term (current) drug therapy: Secondary | ICD-10-CM | POA: Diagnosis not present

## 2013-09-20 DIAGNOSIS — R519 Headache, unspecified: Secondary | ICD-10-CM

## 2013-09-20 DIAGNOSIS — E669 Obesity, unspecified: Secondary | ICD-10-CM | POA: Diagnosis not present

## 2013-09-20 DIAGNOSIS — J45909 Unspecified asthma, uncomplicated: Secondary | ICD-10-CM | POA: Insufficient documentation

## 2013-09-20 DIAGNOSIS — F3289 Other specified depressive episodes: Secondary | ICD-10-CM | POA: Insufficient documentation

## 2013-09-20 DIAGNOSIS — Z87440 Personal history of urinary (tract) infections: Secondary | ICD-10-CM | POA: Insufficient documentation

## 2013-09-20 DIAGNOSIS — Z8619 Personal history of other infectious and parasitic diseases: Secondary | ICD-10-CM | POA: Insufficient documentation

## 2013-09-20 DIAGNOSIS — F329 Major depressive disorder, single episode, unspecified: Secondary | ICD-10-CM | POA: Insufficient documentation

## 2013-09-20 DIAGNOSIS — R51 Headache: Secondary | ICD-10-CM | POA: Insufficient documentation

## 2013-09-20 MED ORDER — IBUPROFEN 600 MG PO TABS: 600.0000 mg | ORAL_TABLET | Freq: Three times a day (TID) | ORAL | Status: DC | PRN

## 2013-09-20 MED ORDER — CYCLOBENZAPRINE HCL 10 MG PO TABS: 10.0000 mg | ORAL_TABLET | Freq: Three times a day (TID) | ORAL | Status: DC | PRN

## 2013-09-20 MED ORDER — OXYCODONE-ACETAMINOPHEN 5-325 MG PO TABS
1.0000 | ORAL_TABLET | Freq: Once | ORAL | Status: AC
  Administered 2013-09-21: 1 via ORAL
  Filled 2013-09-20: qty 1

## 2013-09-20 NOTE — ED Notes (Signed)
Pt presents this mid back pain radiating to head, +blurred vision with hot flashes.+nausea. [facility] waiting [facility] and left. No tx

## 2013-09-21 NOTE — ED Provider Notes (Signed)
CSN: [id number]     Arrival date & time 09/20/13  1953 History   First MD Initiated Contact with Patient 09/20/13 2304     Chief Complaint  Patient presents with  . Back Pain     (Consider location/radiation/quality/duration/timing/severity/associated sxs/prior Treatment) The history is provided by the patient.   patient reports mid back pain and headache for the past 24 hours.  She's had nausea without vomiting.  She denies diarrhea.  No photophobia.  She states the headache came on quickly but was not thunderclap-like.  Denies weakness of her arms or legs.  No recent injury or trauma.  No falls.  Her pain is more located on her bilateral scapular regions without midline discomfort.  No weakness in her legs.  No bowel or bladder complaints.  No history of migraine headaches.  No change in medications.  Denies over-the-counter medications.  Past Medical History  Diagnosis Date  . Asthma   . Obesity   . Adopted   . Depression   . Chlamydia   . Bacterial vaginosis   . Urinary tract infection    Past Surgical History  Procedure Laterality Date  . Cesarean section    . Cesarean section with bilateral tubal ligation     Family History  Problem Relation Age of Onset  . Adopted: Yes  . Hypertension Mother   . Diabetes Father    History  Substance Use Topics  . Smoking status: Never Smoker   . Smokeless tobacco: Never Used  . Alcohol Use: 1.0 oz/week    2 drink(s) per week     Comment: Social   OB History   Grav Para Term Preterm Abortions TAB SAB Ect Mult Living   0 0 0 0 0 2     Review of Systems  Musculoskeletal: Positive for back pain.  All other systems reviewed and are negative.     Allergies  Peanut-containing drug products and Shellfish allergy  Home Medications   Prior to Admission medications   Medication Sig Start Date End Date Taking? Authorizing Provider  albuterol (PROVENTIL HFA;VENTOLIN HFA) 108 (90 BASE) MCG/ACT inhaler Inhale 2 puffs into  the lungs every 6 (six) hours as needed for wheezing or shortness of breath. For asthma   Yes Historical Provider, MD  buPROPion (WELLBUTRIN XL) 150 MG 24 hr tablet Take 150 mg by mouth daily.    Yes Historical Provider, MD  cetirizine (ZYRTEC) 10 MG tablet Take 10 mg by mouth at bedtime.    Yes Historical Provider, MD  ibuprofen (ADVIL,MOTRIN) 200 MG tablet Take 400 mg by mouth every 8 (eight) hours as needed (for back pain.).   Yes Historical Provider, MD  cyclobenzaprine (FLEXERIL) 10 MG tablet Take 1 tablet (10 mg total) by mouth 3 (three) times daily as needed for muscle spasms. 09/20/13   Lyanne Co, MD  ibuprofen (ADVIL,MOTRIN) 600 MG tablet Take 1 tablet (600 mg total) by mouth every 8 (eight) hours as needed. 09/20/13   Lyanne Co, MD   BP 129/79  Pulse 95  Temp(Src) 98.4 F (36.9 C) (Oral)  Resp 18  Ht  (1.575 m)  Wt 240 lb (108.863 kg)  BMI 43.89 kg/m2  SpO2 97%  LMP 09/11/2013 Physical Exam  Nursing note and vitals reviewed. Constitutional: She is oriented to person, place, and time. She appears well-developed and well-nourished. No distress.  HENT:  Head: Normocephalic and atraumatic.  Eyes: EOM are normal. Pupils are equal, round, and reactive  to light.  Neck: Normal range of motion.  Cardiovascular: Normal rate, regular rhythm and normal heart sounds.   Pulmonary/Chest: Effort normal and breath sounds normal.  Abdominal: Soft. She exhibits no distension. There is no tenderness.  Musculoskeletal: Normal range of motion.  Neurological: She is alert and oriented to person, place, and time.  5/5 strength in major muscle groups of  bilateral upper and lower extremities. Speech normal. No facial asymetry.   Skin: Skin is warm and dry.  Psychiatric: She has a normal mood and affect. Judgment normal.    ED Course  Procedures (including critical care time) Labs Review Labs Reviewed - No data to display  Imaging Review No results found.   EKG  Interpretation None      MDM   Final diagnoses:  Headache, unspecified headache type  Thoracic back pain, unspecified back pain laterality    Overall well-appearing.  No indication for imaging.  Doubt subarachnoid hemorrhage.  Normal neurologic exam.  No trauma.  Lung sounds are clear.  Abdomen is benign.  Patient will followup with her primary care physician.  Home with anti-inflammatories and muscle relaxants.  No nausea over the past 12 hours.    Lyanne Co, MD 09/21/13 [phone]

## 2013-10-12 ENCOUNTER — Encounter (HOSPITAL_COMMUNITY): Payer: Self-pay | Admitting: *Deleted

## 2013-10-12 ENCOUNTER — Inpatient Hospital Stay (HOSPITAL_COMMUNITY)
Admission: AD | Admit: 2013-10-12 | Discharge: 2013-10-12 | Disposition: A | Discharge status: Home / Self Care | Payer: Medicaid Other | Source: Ambulatory Visit | Attending: Obstetrics | Admitting: Obstetrics

## 2013-10-12 DIAGNOSIS — R319 Hematuria, unspecified: Secondary | ICD-10-CM | POA: Insufficient documentation

## 2013-10-12 DIAGNOSIS — L02219 Cutaneous abscess of trunk, unspecified: Secondary | ICD-10-CM | POA: Diagnosis not present

## 2013-10-12 DIAGNOSIS — L03316 Cellulitis of umbilicus: Secondary | ICD-10-CM

## 2013-10-12 DIAGNOSIS — L03319 Cellulitis of trunk, unspecified: Principal | ICD-10-CM

## 2013-10-12 DIAGNOSIS — N3000 Acute cystitis without hematuria: Secondary | ICD-10-CM | POA: Insufficient documentation

## 2013-10-12 DIAGNOSIS — N3001 Acute cystitis with hematuria: Secondary | ICD-10-CM

## 2013-10-12 DIAGNOSIS — R350 Frequency of micturition: Secondary | ICD-10-CM | POA: Diagnosis present

## 2013-10-12 LAB — URINE MICROSCOPIC-ADD ON

## 2013-10-12 LAB — POCT PREGNANCY, URINE: Preg Test, Ur: NEGATIVE

## 2013-10-12 LAB — URINALYSIS, ROUTINE W REFLEX MICROSCOPIC
Bilirubin Urine: NEGATIVE
Glucose, UA: NEGATIVE mg/dL
Ketones, ur: NEGATIVE mg/dL
Nitrite: NEGATIVE
Protein, ur: NEGATIVE mg/dL
Specific Gravity, Urine: 1.02 (ref 1.005–1.030)
Urobilinogen, UA: 1 mg/dL (ref 0.0–1.0)
pH: 6 (ref 5.0–8.0)

## 2013-10-12 LAB — MRSA PCR SCREENING: MRSA by PCR: POSITIVE — AB

## 2013-10-12 MED ORDER — MUPIROCIN 2 % EX OINT: 1.0000 "application " | TOPICAL_OINTMENT | Freq: Two times a day (BID) | CUTANEOUS | Status: DC

## 2013-10-12 MED ORDER — SULFAMETHOXAZOLE-TMP DS 800-160 MG PO TABS: 1.0000 | ORAL_TABLET | Freq: Two times a day (BID) | ORAL | Status: DC

## 2013-10-12 NOTE — MAU Note (Signed)
Patient presents with complaint of urinary frequency and urgency X 2 days. Also states that she has noticed a leakage of clear fluid from her belly button since yesterday.

## 2013-10-12 NOTE — MAU Provider Note (Signed)
History     CSN: [id number]  Arrival date and time: 10/12/13 1348   First Provider Initiated Contact with Patient 10/12/13 1434      Chief Complaint  Patient presents with  . Urinary Tract Infection   HPI 28 y.o. [id number] here w/ urinary frequency and urgency x 2 days. Pt also states she has noticed leaking of clear fluid from her belly button, denies pain or irritation in that area. Has history of MRSA.   Past Medical History  Diagnosis Date  . Asthma   . Obesity   . Adopted   . Depression   . Chlamydia   . Bacterial vaginosis   . Urinary tract infection     Past Surgical History  Procedure Laterality Date  . Cesarean section    . Cesarean section with bilateral tubal ligation      Family History  Problem Relation Age of Onset  . Adopted: Yes  . Hypertension Mother   . Diabetes Father     History  Substance Use Topics  . Smoking status: Never Smoker   . Smokeless tobacco: Never Used  . Alcohol Use: 1.0 oz/week    2 drink(s) per week     Comment: Social    Allergies:  Allergies  Allergen Reactions  . Peanut-Containing Drug Products Anaphylaxis, Hives and Swelling    *Throat swelling*  . Shellfish Allergy Anaphylaxis, Hives and Swelling    Throat swelling    Prescriptions prior to admission  Medication Sig Dispense Refill  . albuterol (PROVENTIL HFA;VENTOLIN HFA) 108 (90 BASE) MCG/ACT inhaler Inhale 2 puffs into the lungs every 6 (six) hours as needed for wheezing or shortness of breath. For asthma      . buPROPion (WELLBUTRIN XL) 150 MG 24 hr tablet Take 150 mg by mouth daily.       . cetirizine (ZYRTEC) 10 MG tablet Take 10 mg by mouth at bedtime.       . cyclobenzaprine (FLEXERIL) 10 MG tablet Take 1 tablet (10 mg total) by mouth 3 (three) times daily as needed for muscle spasms.  20 tablet  0  . ibuprofen (ADVIL,MOTRIN) 200 MG tablet Take 400 mg by mouth every 8 (eight) hours as needed (for back pain.).      Marland Kitchen ibuprofen (ADVIL,MOTRIN) 600 MG tablet  Take 1 tablet (600 mg total) by mouth every 8 (eight) hours as needed.  15 tablet  0    Review of Systems  Constitutional: Negative.  Negative for fever and chills.  Respiratory: Negative.   Cardiovascular: Negative.   Gastrointestinal: Negative for nausea, vomiting, abdominal pain, diarrhea and constipation.  Genitourinary: Positive for dysuria, urgency and frequency. Negative for hematuria and flank pain.       Negative for vaginal bleeding, vaginal discharge, dyspareunia  Musculoskeletal: Negative.  Negative for back pain.  Neurological: Negative.   Psychiatric/Behavioral: Negative.    Physical Exam   Blood pressure 127/85, pulse 97, temperature 98.3 F (36.8 C), temperature source Oral, resp. rate 16, height 5' 2.5" (1.588 m), weight 237 lb (107.502 kg), last menstrual period 10/12/2013.  Physical Exam  Nursing note and vitals reviewed. Constitutional: She is oriented to person, place, and time. She appears well-developed and well-nourished. No distress.  Cardiovascular: Normal rate.   Respiratory: Effort normal.  GI: Soft. There is no tenderness.  Musculoskeletal: Normal range of motion.  Neurological: She is alert and oriented to person, place, and time.  Skin: Skin is warm and dry.  Crusting around umbilicus, yellow, malodorous, purulent  discharge noted in umbilicus, deep in umbilicus skin appears erythematous and macerated  Psychiatric: She has a normal mood and affect.    MAU Course  Procedures  Results for orders placed during the hospital encounter of 10/12/13 (from the past 24 hour(s))  URINALYSIS, ROUTINE W REFLEX MICROSCOPIC     Status: Abnormal   Collection Time    10/12/13  2:00 PM      Result Value Ref Range   Color, Urine YELLOW  YELLOW   APPearance CLEAR  CLEAR   Specific Gravity, Urine 1.020  1.005 - 1.030   pH 6.0  5.0 - 8.0   Glucose, UA NEGATIVE  NEGATIVE mg/dL   Hgb urine dipstick LARGE (*) NEGATIVE   Bilirubin Urine NEGATIVE  NEGATIVE   Ketones,  ur NEGATIVE  NEGATIVE mg/dL   Protein, ur NEGATIVE  NEGATIVE mg/dL   Urobilinogen, UA 1.0  0.0 - 1.0 mg/dL   Nitrite NEGATIVE  NEGATIVE   Leukocytes, UA TRACE (*) NEGATIVE  URINE MICROSCOPIC-ADD ON     Status: Abnormal   Collection Time    10/12/13  2:00 PM      Result Value Ref Range   Squamous Epithelial / LPF RARE  RARE   WBC, UA 3-6  <3 WBC/hpf   RBC / HPF 11-20  <3 RBC/hpf   Bacteria, UA FEW (*) RARE  POCT PREGNANCY, URINE     Status: None   Collection Time    10/12/13  2:19 PM      Result Value Ref Range   Preg Test, Ur NEGATIVE  NEGATIVE  MRSA PCR SCREENING     Status: Abnormal   Collection Time    10/12/13  2:25 PM      Result Value Ref Range   MRSA by PCR POSITIVE (*) NEGATIVE     Assessment and Plan   1. Cellulitis of umbilicus   2. Acute cystitis with hematuria   Bactroban ointment for umbilicus infection, keep area as clean and dry as possible Bactrim to cover for both skin infection and UTI Return w/ worsening sx    Medication List         albuterol 108 (90 BASE) MCG/ACT inhaler  Commonly known as:  PROVENTIL HFA;VENTOLIN HFA  Inhale 2 puffs into the lungs every 6 (six) hours as needed for wheezing or shortness of breath. For asthma     buPROPion 150 MG 24 hr tablet  Commonly known as:  WELLBUTRIN XL  Take 150 mg by mouth daily.     cetirizine 10 MG tablet  Commonly known as:  ZYRTEC  Take 10 mg by mouth at bedtime.     cyclobenzaprine 10 MG tablet  Commonly known as:  FLEXERIL  Take 1 tablet (10 mg total) by mouth 3 (three) times daily as needed for muscle spasms.     ibuprofen 600 MG tablet  Commonly known as:  ADVIL,MOTRIN  Take 1 tablet (600 mg total) by mouth every 8 (eight) hours as needed.     ibuprofen 200 MG tablet  Commonly known as:  ADVIL,MOTRIN  Take 400 mg by mouth every 8 (eight) hours as needed (for back pain.).     mupirocin ointment 2 %  Commonly known as:  BACTROBAN  Place 1 application into the nose 2 (two) times daily.      sulfamethoxazole-trimethoprim 800-160 MG per tablet  Commonly known as:  BACTRIM DS  Take 1 tablet by mouth 2 (two) times daily.  Follow-up Information   Follow up with HARPER,CHARLES A, MD. (As needed)    Specialty:  Obstetrics and Gynecology   Contact information:   [address] [place] [address] [phone]         [facility] 10/12/2013, 2:43 PM

## 2013-11-09 ENCOUNTER — Emergency Department (HOSPITAL_COMMUNITY)
Admission: EM | Admit: 2013-11-09 | Discharge: 2013-11-09 | Disposition: A | Discharge status: Home / Self Care | Payer: Medicaid Other | Attending: Emergency Medicine | Admitting: Emergency Medicine

## 2013-11-09 ENCOUNTER — Encounter (HOSPITAL_COMMUNITY): Payer: Self-pay | Admitting: Emergency Medicine

## 2013-11-09 DIAGNOSIS — R42 Dizziness and giddiness: Secondary | ICD-10-CM | POA: Diagnosis not present

## 2013-11-09 DIAGNOSIS — R531 Weakness: Secondary | ICD-10-CM | POA: Diagnosis present

## 2013-11-09 DIAGNOSIS — Z872 Personal history of diseases of the skin and subcutaneous tissue: Secondary | ICD-10-CM | POA: Diagnosis not present

## 2013-11-09 DIAGNOSIS — Z8659 Personal history of other mental and behavioral disorders: Secondary | ICD-10-CM | POA: Diagnosis not present

## 2013-11-09 DIAGNOSIS — Z87440 Personal history of urinary (tract) infections: Secondary | ICD-10-CM | POA: Diagnosis not present

## 2013-11-09 DIAGNOSIS — Z79899 Other long term (current) drug therapy: Secondary | ICD-10-CM | POA: Insufficient documentation

## 2013-11-09 DIAGNOSIS — E669 Obesity, unspecified: Secondary | ICD-10-CM | POA: Diagnosis not present

## 2013-11-09 DIAGNOSIS — Z8619 Personal history of other infectious and parasitic diseases: Secondary | ICD-10-CM | POA: Diagnosis not present

## 2013-11-09 DIAGNOSIS — Z8742 Personal history of other diseases of the female genital tract: Secondary | ICD-10-CM | POA: Insufficient documentation

## 2013-11-09 DIAGNOSIS — J45909 Unspecified asthma, uncomplicated: Secondary | ICD-10-CM | POA: Insufficient documentation

## 2013-11-09 MED ORDER — MECLIZINE HCL 25 MG PO TABS
25.0000 mg | ORAL_TABLET | Freq: Once | ORAL | Status: AC
  Administered 2013-11-09: 25 mg via ORAL
  Filled 2013-11-09: qty 1

## 2013-11-09 MED ORDER — ONDANSETRON 4 MG PO TBDP
4.0000 mg | ORAL_TABLET | Freq: Once | ORAL | Status: AC
  Administered 2013-11-09: 4 mg via ORAL
  Filled 2013-11-09: qty 1

## 2013-11-09 MED ORDER — ONDANSETRON HCL 4 MG PO TABS: 4.0000 mg | ORAL_TABLET | Freq: Three times a day (TID) | ORAL | Status: DC | PRN

## 2013-11-09 MED ORDER — MECLIZINE HCL 25 MG PO TABS: 25.0000 mg | ORAL_TABLET | Freq: Four times a day (QID) | ORAL | Status: DC | PRN

## 2013-11-09 NOTE — ED Notes (Signed)
MD at bedside. Patient stands without difficulty, ambulates without difficulty, states she is not nauseated or feeling dizzy and is in general feeling better.

## 2013-11-09 NOTE — ED Notes (Signed)
Per pt, having headache and general weakness x 2 days.  Pt states vomited once in past.  Has been seen for headache in past but left AMA.

## 2013-11-09 NOTE — ED Notes (Signed)
MD at bedside. 

## 2013-11-09 NOTE — Discharge Instructions (Signed)
Take the medication as prescribed. Recheck if you get worse such as uncontrolled vomiting, headache, trouble using your hands. If you continue to have symptoms after a week, you should be rechecked by the ENT specialist on call, Dr Pollyann Kennedyosen, call his office to get an appointment.     Vertigo Vertigo means you feel like you or your surroundings are moving when they are not. Vertigo can be dangerous if it occurs when you are at work, driving, or performing difficult activities.  CAUSES  Vertigo occurs when there is a conflict of signals sent to your brain from the visual and sensory systems in your body. There are many different causes of vertigo, including:  Infections, especially in the inner ear.  A bad reaction to a drug or misuse of alcohol and medicines.  Withdrawal from drugs or alcohol.  Rapidly changing positions, such as lying down or rolling over in bed.  A migraine headache.  Decreased blood flow to the brain.  Increased pressure in the brain from a head injury, infection, tumor, or bleeding. SYMPTOMS  You may feel as though the world is spinning around or you are falling to the ground. Because your balance is upset, vertigo can cause nausea and vomiting. You may have involuntary eye movements (nystagmus). DIAGNOSIS  Vertigo is usually diagnosed by physical exam. If the cause of your vertigo is unknown, your caregiver may perform imaging tests, such as an MRI scan (magnetic resonance imaging). TREATMENT  Most cases of vertigo resolve on their own, without treatment. Depending on the cause, your caregiver may prescribe certain medicines. If your vertigo is related to body position issues, your caregiver may recommend movements or procedures to correct the problem. In rare cases, if your vertigo is caused by certain inner ear problems, you may need surgery. HOME CARE INSTRUCTIONS   Follow your caregiver's instructions.  Avoid driving.  Avoid operating heavy machinery.  Avoid  performing any tasks that would be dangerous to you or others during a vertigo episode.  Tell your caregiver if you notice that certain medicines seem to be causing your vertigo. Some of the medicines used to treat vertigo episodes can actually make them worse in some people. SEEK IMMEDIATE MEDICAL CARE IF:   Your medicines do not relieve your vertigo or are making it worse.  You develop problems with talking, walking, weakness, or using your arms, hands, or legs.  You develop severe headaches.  Your nausea or vomiting continues or gets worse.  You develop visual changes.  A family member notices behavioral changes.  Your condition gets worse. MAKE SURE YOU:  Understand these instructions.  Will watch your condition.  Will get help right away if you are not doing well or get worse. Document Released: 10/26/2004 Document Revised: 04/10/2011 Document Reviewed: 08/04/2010 [facility] Patient Information 2015 [employer], [place]. This information is not intended to replace advice given to you by your health care provider. Make sure you discuss any questions you have with your health care provider.

## 2013-11-09 NOTE — ED Notes (Addendum)
Patient arrives c/o "foggy" sensation to her head, initially c/o headache, denies pain. States this is just an odd sensation. Patient was ambulatory from triage to treatment room without difficulty. Patient states when she stands her legs begin to feel weak, and that she has dizziness with position change. Patient A&Ox4 at this time. Denies taking any medications PTA for symptoms. Patient states these symptoms have been present for @3days  and she has not been see for this same problem previously.

## 2013-11-09 NOTE — ED Provider Notes (Signed)
CSN: [id number]     Arrival date & time 11/09/13  1050 History   First MD Initiated Contact with Patient 11/09/13 1116     Chief Complaint  Patient presents with  . Headache  . Weakness     (Consider location/radiation/quality/duration/timing/severity/associated sxs/prior Treatment) HPI  Patient reports 2-3 days ago she started having an abnormal discomfort to her portion of her head. However she denies having any pain. She denies numbness or tingling in her scalp. She states when she stands up she feels like she is falling and that feeling persist as long as she is standing up. She does feel like she is falling in the same direction each time. She has not fallen but she states she has stumbled. In other words the feeling of falling does not go away or fatigue. She has nausea when she stands up. She states when she stands up she feels like the room is spinning. She has not felt clumsy using her hands. She does describe both her feet are numb up to her ankles. She denies any difficulty swallowing or change in her sense of smell. Patient is right-handed. She denies any head trauma.  Family history unknown patient is adopted  PCP none  Past Medical History  Diagnosis Date  . Asthma   . Obesity   . Adopted   . Depression   . Chlamydia   . Bacterial vaginosis   . Urinary tract infection    Eczema  Past Surgical History  Procedure Laterality Date  . Cesarean section    . Cesarean section with bilateral tubal ligation     Family History  Problem Relation Age of Onset  . Adopted: Yes  . Hypertension Mother   . Diabetes Father    History  Substance Use Topics  . Smoking status: Never Smoker   . Smokeless tobacco: Never Used  . Alcohol Use: 1.0 oz/week    2 drink(s) per week     Comment: Social   Stay at home mom  OB History   Grav Para Term Preterm Abortions TAB SAB Ect Mult Living   2 2 1 1  0 0 0 0 0 2     Review of Systems  All other systems reviewed and are  negative.     Allergies  Peanut-containing drug products and Shellfish allergy  Home Medications   Prior to Admission medications   Medication Sig Start Date End Date Taking? Authorizing Provider  albuterol (PROVENTIL HFA;VENTOLIN HFA) 108 (90 BASE) MCG/ACT inhaler Inhale 2 puffs into the lungs every 6 (six) hours as needed for wheezing or shortness of breath. For asthma   Yes Historical Provider, MD  cetirizine (ZYRTEC) 10 MG tablet Take 10 mg by mouth at bedtime.    Yes Historical Provider, MD   BP 106/73  Pulse 85  Temp(Src) 98.7 F (37.1 C) (Oral)  Resp 18  SpO2 100%  LMP 10/12/2013  Vital signs normal   Physical Exam  Nursing note and vitals reviewed. Constitutional: She is oriented to person, place, and time. She appears well-developed and well-nourished.  Non-toxic appearance. She does not appear ill. No distress.  HENT:  Head: Normocephalic and atraumatic.  Right Ear: External ear normal.  Left Ear: External ear normal.  Nose: Nose normal. No mucosal edema or rhinorrhea.  Mouth/Throat: Oropharynx is clear and moist and mucous membranes are normal. No dental abscesses or uvula swelling.  Eyes: Conjunctivae and EOM are normal. Pupils are equal, round, and reactive to light.  Patient complains  of starting to feel like the room was spinning when she sat out I did not see any obvious nystagmus  Neck: Normal range of motion and full passive range of motion without pain. Neck supple.  Cardiovascular: Normal rate, regular rhythm and normal heart sounds.  Exam reveals no gallop and no friction rub.   No murmur heard. Pulmonary/Chest: Effort normal and breath sounds normal. No respiratory distress. She has no wheezes. She has no rhonchi. She has no rales. She exhibits no tenderness and no crepitus.  Abdominal: Soft. Normal appearance and bowel sounds are normal. She exhibits no distension. There is no tenderness. There is no rebound and no guarding.  Musculoskeletal: Normal  range of motion. She exhibits no edema and no tenderness.  Moves all extremities well.   Neurological: She is alert and oriented to person, place, and time. She has normal strength. No cranial nerve deficit.  No facial asymmetry, equal grips, no pronator drift present. Patient does heel-to-shin bilaterally equally well. She initially had past-pointing with finger-to-nose on the right however when we went back she did it well.  Skin: Skin is warm, dry and intact. No rash noted. No erythema. No pallor.  Psychiatric: She has a normal mood and affect. Her speech is normal and behavior is normal. Her mood appears not anxious.    ED Course  Procedures (including critical care time)  Medications  meclizine (ANTIVERT) tablet 25 mg (25 mg Oral Given 11/09/13 1215)  ondansetron (ZOFRAN-ODT) disintegrating tablet 4 mg (4 mg Oral Given 11/09/13 1215)   Recheck at discharge. Patient feels much improved. She is able to sit up and walk without feeling dizziness, nausea, or feeling like she is stumbling or falling.  Labs Review Labs Reviewed - No data to display  Imaging Review No results found.   EKG Interpretation None      MDM   Final diagnoses:  Vertigo     Discharge Medication List as of 11/09/2013  2:05 PM    START taking these medications   Details  meclizine (ANTIVERT) 25 MG tablet Take 1 tablet (25 mg total) by mouth 4 (four) times daily as needed., Starting 11/09/2013, Until Discontinued, Print    ondansetron (ZOFRAN) 4 MG tablet Take 1 tablet (4 mg total) by mouth every 8 (eight) hours as needed for nausea or vomiting., Starting 11/09/2013, Until Discontinued, Print        Plan discharge  Devoria AlbeIva Bridgette Wolden, MD, Franz DellFACEP    Devario Bucklew L Vallen Calabrese, MD 11/09/13 [phone]

## 2013-12-01 ENCOUNTER — Encounter (HOSPITAL_COMMUNITY): Payer: Self-pay | Admitting: Emergency Medicine

## 2013-12-15 ENCOUNTER — Inpatient Hospital Stay (HOSPITAL_COMMUNITY)
Admission: AD | Admit: 2013-12-15 | Discharge: 2013-12-19 | DRG: 808 | Disposition: A | Discharge status: Rehabilitation Facility | Payer: Medicaid Other | Source: Ambulatory Visit | Attending: Internal Medicine | Admitting: Internal Medicine

## 2013-12-15 ENCOUNTER — Encounter (HOSPITAL_COMMUNITY): Payer: Self-pay | Admitting: *Deleted

## 2013-12-15 DIAGNOSIS — E538 Deficiency of other specified B group vitamins: Secondary | ICD-10-CM | POA: Diagnosis present

## 2013-12-15 DIAGNOSIS — F329 Major depressive disorder, single episode, unspecified: Secondary | ICD-10-CM | POA: Diagnosis present

## 2013-12-15 DIAGNOSIS — E669 Obesity, unspecified: Secondary | ICD-10-CM | POA: Diagnosis present

## 2013-12-15 DIAGNOSIS — Z8614 Personal history of Methicillin resistant Staphylococcus aureus infection: Secondary | ICD-10-CM

## 2013-12-15 DIAGNOSIS — G629 Polyneuropathy, unspecified: Secondary | ICD-10-CM | POA: Diagnosis present

## 2013-12-15 DIAGNOSIS — R5081 Fever presenting with conditions classified elsewhere: Secondary | ICD-10-CM | POA: Diagnosis present

## 2013-12-15 DIAGNOSIS — Z6839 Body mass index (BMI) 39.0-39.9, adult: Secondary | ICD-10-CM

## 2013-12-15 DIAGNOSIS — E43 Unspecified severe protein-calorie malnutrition: Secondary | ICD-10-CM | POA: Diagnosis present

## 2013-12-15 DIAGNOSIS — G320 Subacute combined degeneration of spinal cord in diseases classified elsewhere: Secondary | ICD-10-CM | POA: Diagnosis present

## 2013-12-15 DIAGNOSIS — R42 Dizziness and giddiness: Secondary | ICD-10-CM

## 2013-12-15 DIAGNOSIS — M9982 Other biomechanical lesions of thoracic region: Secondary | ICD-10-CM

## 2013-12-15 DIAGNOSIS — D61818 Other pancytopenia: Secondary | ICD-10-CM | POA: Diagnosis present

## 2013-12-15 DIAGNOSIS — R29898 Other symptoms and signs involving the musculoskeletal system: Secondary | ICD-10-CM

## 2013-12-15 DIAGNOSIS — N390 Urinary tract infection, site not specified: Secondary | ICD-10-CM | POA: Diagnosis present

## 2013-12-15 DIAGNOSIS — R509 Fever, unspecified: Secondary | ICD-10-CM | POA: Diagnosis present

## 2013-12-15 HISTORY — DX: Methicillin resistant Staphylococcus aureus infection, unspecified site: A49.02

## 2013-12-15 LAB — PREPARE RBC (CROSSMATCH)

## 2013-12-15 LAB — COMPREHENSIVE METABOLIC PANEL
ALT: 31 U/L (ref 0–35)
AST: 67 U/L — ABNORMAL HIGH (ref 0–37)
Albumin: 4 g/dL (ref 3.5–5.2)
Alkaline Phosphatase: 25 U/L — ABNORMAL LOW (ref 39–117)
Anion gap: 15 (ref 5–15)
BUN: 14 mg/dL (ref 6–23)
CO2: 24 mEq/L (ref 19–32)
Calcium: 9 mg/dL (ref 8.4–10.5)
Chloride: 101 mEq/L (ref 96–112)
Creatinine, Ser: 0.95 mg/dL (ref 0.50–1.10)
GFR calc Af Amer: >90 mL/min (ref >90)
GFR calc non Af Amer: 80 mL/min — ABNORMAL LOW (ref >90)
Glucose, Bld: 98 mg/dL (ref 70–99)
Potassium: 4.1 mEq/L (ref 3.7–5.3)
Sodium: 140 mEq/L (ref 137–147)
Total Bilirubin: 3.2 mg/dL — ABNORMAL HIGH (ref 0.3–1.2)
Total Protein: 7.5 g/dL (ref 6.0–8.3)

## 2013-12-15 LAB — CBC WITH DIFFERENTIAL/PLATELET
Basophils Absolute: 0 10*3/uL (ref 0.0–0.1)
Basophils Relative: 1 % (ref 0–1)
Eosinophils Absolute: 0 10*3/uL (ref 0.0–0.7)
Eosinophils Relative: 0 % (ref 0–5)
HCT: 16.5 % — ABNORMAL LOW (ref 36.0–46.0)
Hemoglobin: 5.5 g/dL — CL (ref 12.0–15.0)
Lymphocytes Relative: 60 % — ABNORMAL HIGH (ref 12–46)
Lymphs Abs: 0.9 10*3/uL (ref 0.7–4.0)
MCH: 34 pg (ref 26.0–34.0)
MCHC: 33.3 g/dL (ref 30.0–36.0)
MCV: 101.9 fL — ABNORMAL HIGH (ref 78.0–100.0)
Monocytes Absolute: 0.1 10*3/uL (ref 0.1–1.0)
Monocytes Relative: 7 % (ref 3–12)
Neutro Abs: 0.4 10*3/uL — ABNORMAL LOW (ref 1.7–7.7)
Neutrophils Relative %: 32 % — ABNORMAL LOW (ref 43–77)
Platelets: 37 10*3/uL — ABNORMAL LOW (ref 150–400)
RBC: 1.62 MIL/uL — ABNORMAL LOW (ref 3.87–5.11)
RDW: 26.3 % — ABNORMAL HIGH (ref 11.5–15.5)
WBC: 1.4 10*3/uL — CL (ref 4.0–10.5)
nRBC: 18 /100 WBC — ABNORMAL HIGH

## 2013-12-15 LAB — APTT: aPTT: 30 seconds (ref 24–37)

## 2013-12-15 LAB — RETICULOCYTES
RBC.: 1.65 MIL/uL — ABNORMAL LOW (ref 3.87–5.11)
Retic Count, Absolute: 26.4 10*3/uL (ref 19.0–186.0)
Retic Ct Pct: 1.6 % (ref 0.4–3.1)

## 2013-12-15 LAB — PROTIME-INR
INR: 1.2 (ref 0.00–1.49)
Prothrombin Time: 15.3 seconds — ABNORMAL HIGH (ref 11.6–15.2)

## 2013-12-15 LAB — I-STAT CG4 LACTIC ACID, ED: Lactic Acid, Venous: 1.32 mmol/L (ref 0.5–2.2)

## 2013-12-15 LAB — ABO/RH: ABO/RH(D): A POS

## 2013-12-15 MED ORDER — SODIUM CHLORIDE 0.9 % IV BOLUS (SEPSIS)
2000.0000 mL | Freq: Once | INTRAVENOUS | Status: AC
  Administered 2013-12-15: 2000 mL via INTRAVENOUS

## 2013-12-15 MED ORDER — SODIUM CHLORIDE 0.9 % IV SOLN
Freq: Once | INTRAVENOUS | Status: AC
  Administered 2013-12-16: 01:00:00 via INTRAVENOUS

## 2013-12-15 MED ORDER — ACETAMINOPHEN 325 MG PO TABS
650.0000 mg | ORAL_TABLET | Freq: Four times a day (QID) | ORAL | Status: DC | PRN
  Administered 2013-12-15: 650 mg via ORAL
  Filled 2013-12-15: qty 2

## 2013-12-15 NOTE — MAU Provider Note (Signed)
History     CSN: [id number]  Arrival date and time: 12/15/13 1743   First Provider Initiated Contact with Patient 12/15/13 1838      Chief Complaint  Patient presents with  . Dizziness   HPI This is a 28 y.o. female who presents with c/o persistent vertigo for a month. Has since also developed progressively worsening bilateral leg weakness and numbness over the past 2 days.  Before that was able to walk normally and had normal sensation. Then one morning had to crawl to bathroom, legs felt like "wet noodles".  States when her family put her shoes on, she could not feel them at all. States they had to carry her to and from car.  RN Note:  Expand All Collapse All   Patient [facility] and diagnosed with vertigo. Was given medication for vertigo and nausea. States she has continued to have vertigo to the point that she is unable to care for herself. She has had progressing weakness in both legs and unable to walk or lift her legs and legs are both numb. Nausea off and on with standing. Has a headache off and on.         OB History    Gravida Para Term Preterm AB TAB SAB Ectopic Multiple Living   2 2 1 1  0 0 0 0 0 2      Obstetric Comments   #1Failure to descend, #2 PPROM      Past Medical History  Diagnosis Date  . Asthma   . Obesity   . Adopted   . Depression   . Chlamydia   . Bacterial vaginosis   . Urinary tract infection   . MRSA infection     Past Surgical History  Procedure Laterality Date  . Cesarean section    . Cesarean section with bilateral tubal ligation      Family History  Problem Relation Age of Onset  . Adopted: Yes  . Hypertension Mother   . Diabetes Father     History  Substance Use Topics  . Smoking status: Never Smoker   . Smokeless tobacco: Never Used  . Alcohol Use: No    Allergies:  Allergies  Allergen Reactions  . Peanut-Containing Drug Products Anaphylaxis, Hives and Swelling    *Throat swelling*   . Shellfish Allergy Anaphylaxis, Hives and Swelling    Throat swelling    Prescriptions prior to admission  Medication Sig Dispense Refill Last Dose  . albuterol (PROVENTIL HFA;VENTOLIN HFA) 108 (90 BASE) MCG/ACT inhaler Inhale 2 puffs into the lungs every 6 (six) hours as needed for wheezing or shortness of breath. For asthma   11/08/2013 at Unknown time  . buPROPion (WELLBUTRIN XL) 150 MG 24 hr tablet Take 150 mg by mouth daily.    a month  . cetirizine (ZYRTEC) 10 MG tablet Take 10 mg by mouth at bedtime.    11/09/2013 at Unknown time  . ibuprofen (ADVIL,MOTRIN) 200 MG tablet Take 400 mg by mouth every 8 (eight) hours as needed (for back pain.).   Past Month at Unknown time  . meclizine (ANTIVERT) 25 MG tablet Take 1 tablet (25 mg total) by mouth 4 (four) times daily as needed. 40 tablet 0   . mupirocin ointment (BACTROBAN) 2 % Place 1 application into the nose 2 (two) times daily. 22 g 0 11/08/2013 at Unknown time  . ondansetron (ZOFRAN) 4 MG tablet Take 1 tablet (4 mg total) by mouth every  8 (eight) hours as needed for nausea or vomiting. 12 tablet 0     Review of Systems  Constitutional: Negative for fever, chills and malaise/fatigue.  Gastrointestinal: Positive for nausea. Negative for vomiting and abdominal pain.  Neurological: Positive for dizziness, sensory change (Bilateral legs and feet), focal weakness (Bilateral legs and feet) and weakness. Negative for speech change, seizures, loss of consciousness and headaches.   Physical Exam   Blood pressure 101/63, pulse 111, temperature 98.7 F (37.1 C), temperature source Oral, resp. rate 16, height 5\' 2"  (1.575 m), weight 200 lb (90.719 kg), last menstrual period 12/13/2013, SpO2 100 %.  Physical Exam  Constitutional: She is oriented to person, place, and time. She appears well-developed and well-nourished. No distress.  HENT:  Head: Normocephalic.  Neck: Normal range of motion. Neck supple.  Cardiovascular: Normal rate and  regular rhythm.  Exam reveals no gallop and no friction rub.   No murmur heard. Respiratory: Effort normal and breath sounds normal. No respiratory distress. She has no wheezes. She has no rales.  GI: Soft. Bowel sounds are normal.  Musculoskeletal: Normal range of motion.  Neurological: She is alert and oriented to person, place, and time.  Can move both legs, but they are weak Extensive neuro exam deferred  Skin: Skin is warm and dry.  Psychiatric: She has a normal mood and affect.    MAU Course  Procedures  MDM No results found for this or any previous visit (from the past 24 hour(s)).   Assessment and Plan  A:  Vertigo, persistent       Bilateral leg weakness and numbness  P:  Discussed with Dr Gaynell FaceMarshall who [facility]  [facility],Haley Roza 12/15/2013, 6:41 PM

## 2013-12-15 NOTE — ED Provider Notes (Signed)
CSN: [id number]     Arrival date & time 12/15/13  1743 History   First MD Initiated Contact with Patient 12/15/13 2150     Chief Complaint  Patient presents with  . Dizziness     (Consider location/radiation/quality/duration/timing/severity/associated sxs/prior Treatment) Patient is a 28 y.o. female presenting with dizziness.  Dizziness Quality:  Vertigo Severity:  Moderate Onset quality:  Gradual Duration:  1 week Timing:  Constant Progression:  Worsening Chronicity:  New Context: standing up   Relieved by:  Nothing Worsened by:  Standing up and sitting upright Associated symptoms: no blood in stool, no chest pain, no syncope, no tinnitus, no vision changes and no vomiting     Past Medical History  Diagnosis Date  . Asthma   . Obesity   . Adopted   . Depression   . Chlamydia   . Bacterial vaginosis   . Urinary tract infection   . MRSA infection    Past Surgical History  Procedure Laterality Date  . Cesarean section    . Cesarean section with bilateral tubal ligation     Family History  Problem Relation Age of Onset  . Adopted: Yes  . Hypertension Mother   . Diabetes Father    History  Substance Use Topics  . Smoking status: Never Smoker   . Smokeless tobacco: Never Used  . Alcohol Use: No   OB History    Gravida Para Term Preterm AB TAB SAB Ectopic Multiple Living   2 2 1 1  0 0 0 0 0 2      Obstetric Comments   #1Failure to descend, #2 PPROM     Review of Systems  HENT: Negative for tinnitus.   Cardiovascular: Negative for chest pain and syncope.  Gastrointestinal: Negative for vomiting and blood in stool.  Neurological: Positive for dizziness.  All other systems reviewed and are negative.     Allergies  Peanut-containing drug products and Shellfish allergy  Home Medications   Prior to Admission medications   Medication Sig Start Date End Date Taking? Authorizing Provider  albuterol (PROVENTIL HFA;VENTOLIN HFA) 108 (90 BASE) MCG/ACT  inhaler Inhale 2 puffs into the lungs every 6 (six) hours as needed for wheezing or shortness of breath. For asthma   Yes Historical Provider, MD  cetirizine (ZYRTEC) 10 MG tablet Take 10 mg by mouth at bedtime.    Yes Historical Provider, MD  buPROPion (WELLBUTRIN XL) 150 MG 24 hr tablet Take 150 mg by mouth daily.     Historical Provider, MD  ibuprofen (ADVIL,MOTRIN) 200 MG tablet Take 400 mg by mouth every 8 (eight) hours as needed (for back pain.).    Historical Provider, MD  meclizine (ANTIVERT) 25 MG tablet Take 1 tablet (25 mg total) by mouth 4 (four) times daily as needed. Patient not taking: Reported on 12/15/2013 11/09/13   Ward Givens, MD  mupirocin ointment (BACTROBAN) 2 % Place 1 application into the nose 2 (two) times daily. Patient not taking: Reported on 12/15/2013 10/12/13   Archie Patten, CNM  ondansetron (ZOFRAN) 4 MG tablet Take 1 tablet (4 mg total) by mouth every 8 (eight) hours as needed for nausea or vomiting. Patient not taking: Reported on 12/15/2013 11/09/13   Ward Givens, MD   BP 114/69 mmHg  Pulse 86  Temp(Src) 98.7 F (37.1 C) (Oral)  Resp 158  Ht 5\' 2"  (1.575 m)  Wt 214 lb 4.6 oz (97.2 kg)  BMI 39.18 kg/m2  SpO2 100%  LMP 12/13/2013 Physical  Exam  Constitutional: She is oriented to person, place, and time. She appears well-developed and well-nourished.  HENT:  Head: Normocephalic and atraumatic.  Right Ear: External ear normal.  Left Ear: External ear normal.  Eyes: Conjunctivae and EOM are normal. Pupils are equal, round, and reactive to light.  Neck: Normal range of motion. Neck supple.  Cardiovascular: Normal rate, regular rhythm, normal heart sounds and intact distal pulses.   Pulmonary/Chest: Effort normal and breath sounds normal.  Abdominal: Soft. Bowel sounds are normal. There is no tenderness.  Musculoskeletal: Normal range of motion.  Neurological: She is alert and oriented to person, place, and time. She has normal strength and normal  reflexes. No cranial nerve deficit or sensory deficit. Gait (unable to stand unassisted, romberg +) abnormal.  Skin: Skin is warm and dry.  Vitals reviewed.   ED Course  Procedures (including critical care time) Labs Review Labs Reviewed  URINALYSIS, ROUTINE W REFLEX MICROSCOPIC - Abnormal; Notable for the following:    Color, Urine AMBER (*)    APPearance HAZY (*)    Hgb urine dipstick LARGE (*)    Bilirubin Urine SMALL (*)    Urobilinogen, UA >8.0 (*)    Leukocytes, UA SMALL (*)    All other components within normal limits  CBC WITH DIFFERENTIAL - Abnormal; Notable for the following:    WBC 1.4 (*)    RBC 1.62 (*)    Hemoglobin 5.5 (*)    HCT 16.5 (*)    MCV 101.9 (*)    RDW 26.3 (*)    Platelets 37 (*)    Neutrophils Relative % 32 (*)    Lymphocytes Relative 60 (*)    nRBC 18 (*)    Neutro Abs 0.4 (*)    All other components within normal limits  COMPREHENSIVE METABOLIC PANEL - Abnormal; Notable for the following:    AST 67 (*)    Alkaline Phosphatase 25 (*)    Total Bilirubin 3.2 (*)    GFR calc non Af Amer 80 (*)    All other components within normal limits  LACTATE DEHYDROGENASE - Abnormal; Notable for the following:    LDH 4300 (*)    All other components within normal limits  PROTIME-INR - Abnormal; Notable for the following:    Prothrombin Time 15.3 (*)    All other components within normal limits  RETICULOCYTES - Abnormal; Notable for the following:    RBC. 1.65 (*)    All other components within normal limits  IRON AND TIBC - Abnormal; Notable for the following:    TIBC 207 (*)    UIBC 94 (*)    All other components within normal limits  VITAMIN B12 - Abnormal; Notable for the following:    Vitamin B-12 104 (*)    All other components within normal limits  COMPREHENSIVE METABOLIC PANEL - Abnormal; Notable for the following:    Glucose, Bld 103 (*)    AST 62 (*)    Alkaline Phosphatase 24 (*)    Total Bilirubin 3.1 (*)    GFR calc non Af Amer 87 (*)     Anion gap 16 (*)    All other components within normal limits  CBC WITH DIFFERENTIAL - Abnormal; Notable for the following:    WBC 1.1 (*)    RBC 1.43 (*)    Hemoglobin 5.0 (*)    HCT 14.6 (*)    MCV 102.1 (*)    MCH 35.0 (*)    RDW 26.0 (*)  Platelets 30 (*)    Neutrophils Relative % 27 (*)    Lymphocytes Relative 64 (*)    Neutro Abs 0.3 (*)    All other components within normal limits  URIC ACID - Abnormal; Notable for the following:    Uric Acid, Serum 7.2 (*)    All other components within normal limits  URINE MICROSCOPIC-ADD ON - Abnormal; Notable for the following:    Bacteria, UA FEW (*)    All other components within normal limits  MRSA PCR SCREENING  CULTURE, BLOOD (ROUTINE X 2)  CULTURE, BLOOD (ROUTINE X 2)  RPR  HIV ANTIBODY (ROUTINE TESTING)  PATHOLOGIST SMEAR REVIEW  APTT  FERRITIN  FOLATE  HEPATITIS PANEL, ACUTE  TSH  ANA  B. BURGDORFI ANTIBODIES  ROCKY MTN SPOTTED FVR AB, IGM-BLOOD  CBC  COPPER, SERUM  ZINC  URINE RAPID DRUG SCREEN (HOSP PERFORMED)  I-STAT CG4 LACTIC ACID, ED  TYPE AND SCREEN  PREPARE RBC (CROSSMATCH)  ABO/RH  DIRECT ANTIGLOBULIN TEST    Imaging Review Ct Head Wo Contrast  12/16/2013   CLINICAL DATA:  Dizziness and weakness of both lower extremities.  EXAM: CT HEAD WITHOUT CONTRAST  TECHNIQUE: Contiguous axial images were obtained from the base of the skull through the vertex without intravenous contrast.  COMPARISON:  None.  FINDINGS: Ventricles and sulci appear symmetrical. No mass effect or midline shift. No abnormal extra-axial fluid collections. Gray-white matter junctions are distinct. Basal cisterns are not effaced. No evidence of acute intracranial hemorrhage. No depressed skull fractures. Visualized paranasal sinuses and mastoid air cells are not opacified.  IMPRESSION: Normal   Electronically Signed   By: Burman NievesWilliam  Stevens M.D.   On: 12/16/2013 01:29     EKG Interpretation None      MDM   Final diagnoses:   Vertigo  Leg weakness, bilateral    28 y.o. female with pertinent PMH of prior chlamydia and MRSA infection presents with dizziness which she describes as vertigo. On speaking with the patient she was seen 1 week ago for the same symptoms, described the room spinning and nausea with vomiting, however states this was only worse on sitting up or standing.  She denies fever, other symptoms. On arrival today vitals signs physical exam as above. Patient has strong orthostasis on standing, her heart rate changed from 100-140. She has a positive Romberg but no focal neurodeficits.  She does have a hyperpigmented rash on bilateral palms.  Do not feel that exam is consistent with vertigo at this time given strong orthostasis, no change in symptoms with turning head, and no nystagmus. Labs demonstrated pancytopenia. Consulted hospitalist and hematology who recommended labs. As hemoglobin is under 6, will transfuse for symptomatically anemia. Admitted in stable condition.    1. Vertigo   2. Leg weakness, bilateral   3. Dizziness   4. Pancytopenia   5. Other specified fever   6. Weakness of both legs         Mirian MoMatthew Aidyn Sportsman, MD 12/16/13 1539

## 2013-12-15 NOTE — ED Notes (Signed)
Bed: [id number] Expected date:  Expected time:  Means of arrival:  Comments: [facility]

## 2013-12-15 NOTE — MAU Note (Signed)
Medication helped for a little while, never completely went away. Weakness in legs started this week, can't walk, legs are numb. Not keeping any food down.

## 2013-12-15 NOTE — MAU Note (Signed)
Patient states [facility] and diagnosed with vertigo. Was given medication for vertigo and nausea. States she has continued to have vertigo to the point that she is unable to care for herself. She has had progressing weakness in both legs and unable to walk or lift her legs and legs are both numb. Nausea off and on with standing. Has a headache off and on.

## 2013-12-16 ENCOUNTER — Inpatient Hospital Stay (HOSPITAL_COMMUNITY): Payer: Medicaid Other

## 2013-12-16 ENCOUNTER — Encounter (HOSPITAL_COMMUNITY): Payer: Self-pay | Admitting: Internal Medicine

## 2013-12-16 DIAGNOSIS — R509 Fever, unspecified: Secondary | ICD-10-CM | POA: Diagnosis present

## 2013-12-16 DIAGNOSIS — R531 Weakness: Secondary | ICD-10-CM

## 2013-12-16 DIAGNOSIS — R200 Anesthesia of skin: Secondary | ICD-10-CM

## 2013-12-16 DIAGNOSIS — R29898 Other symptoms and signs involving the musculoskeletal system: Secondary | ICD-10-CM | POA: Diagnosis present

## 2013-12-16 DIAGNOSIS — R42 Dizziness and giddiness: Secondary | ICD-10-CM

## 2013-12-16 DIAGNOSIS — D61818 Other pancytopenia: Principal | ICD-10-CM

## 2013-12-16 LAB — COMPREHENSIVE METABOLIC PANEL
ALT: 27 U/L (ref 0–35)
AST: 62 U/L — ABNORMAL HIGH (ref 0–37)
Albumin: 3.6 g/dL (ref 3.5–5.2)
Alkaline Phosphatase: 24 U/L — ABNORMAL LOW (ref 39–117)
Anion gap: 16 — ABNORMAL HIGH (ref 5–15)
BUN: 14 mg/dL (ref 6–23)
CO2: 22 mEq/L (ref 19–32)
Calcium: 8.6 mg/dL (ref 8.4–10.5)
Chloride: 103 mEq/L (ref 96–112)
Creatinine, Ser: 0.89 mg/dL (ref 0.50–1.10)
GFR calc Af Amer: >90 mL/min (ref >90)
GFR calc non Af Amer: 87 mL/min — ABNORMAL LOW (ref >90)
Glucose, Bld: 103 mg/dL — ABNORMAL HIGH (ref 70–99)
Potassium: 3.8 mEq/L (ref 3.7–5.3)
Sodium: 141 mEq/L (ref 137–147)
Total Bilirubin: 3.1 mg/dL — ABNORMAL HIGH (ref 0.3–1.2)
Total Protein: 6.8 g/dL (ref 6.0–8.3)

## 2013-12-16 LAB — URINALYSIS, ROUTINE W REFLEX MICROSCOPIC
Glucose, UA: NEGATIVE mg/dL
Ketones, ur: NEGATIVE mg/dL
Nitrite: NEGATIVE
Protein, ur: NEGATIVE mg/dL
Specific Gravity, Urine: 1.025 (ref 1.005–1.030)
Urobilinogen, UA: >8 mg/dL — ABNORMAL HIGH (ref 0.0–1.0)
pH: 5 (ref 5.0–8.0)

## 2013-12-16 LAB — CBC WITH DIFFERENTIAL/PLATELET
Basophils Absolute: 0 10*3/uL (ref 0.0–0.1)
Basophils Relative: 0 % (ref 0–1)
Eosinophils Absolute: 0 10*3/uL (ref 0.0–0.7)
Eosinophils Relative: 0 % (ref 0–5)
HCT: 14.6 % — ABNORMAL LOW (ref 36.0–46.0)
Hemoglobin: 5 g/dL — CL (ref 12.0–15.0)
Lymphocytes Relative: 64 % — ABNORMAL HIGH (ref 12–46)
Lymphs Abs: 0.7 10*3/uL (ref 0.7–4.0)
MCH: 35 pg — ABNORMAL HIGH (ref 26.0–34.0)
MCHC: 34.2 g/dL (ref 30.0–36.0)
MCV: 102.1 fL — ABNORMAL HIGH (ref 78.0–100.0)
Monocytes Absolute: 0.1 10*3/uL (ref 0.1–1.0)
Monocytes Relative: 9 % (ref 3–12)
Neutro Abs: 0.3 10*3/uL — ABNORMAL LOW (ref 1.7–7.7)
Neutrophils Relative %: 27 % — ABNORMAL LOW (ref 43–77)
Platelets: 30 10*3/uL — ABNORMAL LOW (ref 150–400)
RBC: 1.43 MIL/uL — ABNORMAL LOW (ref 3.87–5.11)
RDW: 26 % — ABNORMAL HIGH (ref 11.5–15.5)
WBC: 1.1 10*3/uL — CL (ref 4.0–10.5)

## 2013-12-16 LAB — IRON AND TIBC
Iron: 113 ug/dL (ref 42–135)
Saturation Ratios: 55 % (ref 20–55)
TIBC: 207 ug/dL — ABNORMAL LOW (ref 250–470)
UIBC: 94 ug/dL — ABNORMAL LOW (ref 125–400)

## 2013-12-16 LAB — FOLATE: Folate: >20 ng/mL

## 2013-12-16 LAB — HEPATITIS PANEL, ACUTE
HCV Ab: NEGATIVE
Hep A IgM: NONREACTIVE
Hep B C IgM: NONREACTIVE
Hepatitis B Surface Ag: NEGATIVE

## 2013-12-16 LAB — DIRECT ANTIGLOBULIN TEST (NOT AT ARMC)
DAT, IgG: NEGATIVE
DAT, complement: NEGATIVE

## 2013-12-16 LAB — FERRITIN: Ferritin: 137 ng/mL (ref 10–291)

## 2013-12-16 LAB — PATHOLOGIST SMEAR REVIEW

## 2013-12-16 LAB — MRSA PCR SCREENING: MRSA by PCR: NEGATIVE

## 2013-12-16 LAB — URINE MICROSCOPIC-ADD ON

## 2013-12-16 LAB — RAPID URINE DRUG SCREEN, HOSP PERFORMED
Amphetamines: NOT DETECTED
Barbiturates: NOT DETECTED
Benzodiazepines: NOT DETECTED
Cocaine: NOT DETECTED
Opiates: NOT DETECTED
Tetrahydrocannabinol: NOT DETECTED

## 2013-12-16 LAB — URIC ACID: Uric Acid, Serum: 7.2 mg/dL — ABNORMAL HIGH (ref 2.4–7.0)

## 2013-12-16 LAB — TSH: TSH: 2.03 u[IU]/mL (ref 0.350–4.500)

## 2013-12-16 LAB — RPR

## 2013-12-16 LAB — LACTATE DEHYDROGENASE: LDH: 4300 U/L — ABNORMAL HIGH (ref 94–250)

## 2013-12-16 LAB — VITAMIN B12: Vitamin B-12: 104 pg/mL — ABNORMAL LOW (ref 211–911)

## 2013-12-16 LAB — HIV ANTIBODY (ROUTINE TESTING W REFLEX): HIV 1&2 Ab, 4th Generation: NONREACTIVE

## 2013-12-16 MED ORDER — SODIUM CHLORIDE 0.9 % IV SOLN
INTRAVENOUS | Status: DC
  Administered 2013-12-16 (×2): via INTRAVENOUS
  Filled 2013-12-16 (×9): qty 1000

## 2013-12-16 MED ORDER — CYANOCOBALAMIN 1000 MCG/ML IJ SOLN
1000.0000 ug | Freq: Once | INTRAMUSCULAR | Status: AC
  Administered 2013-12-16: 1000 ug via INTRAMUSCULAR
  Filled 2013-12-16: qty 1

## 2013-12-16 MED ORDER — ACETAMINOPHEN 650 MG RE SUPP: 650.0000 mg | Freq: Four times a day (QID) | RECTAL | Status: DC | PRN

## 2013-12-16 MED ORDER — BUPROPION HCL ER (XL) 150 MG PO TB24
150.0000 mg | ORAL_TABLET | Freq: Every day | ORAL | Status: DC
  Administered 2013-12-16 – 2013-12-19 (×4): 150 mg via ORAL
  Filled 2013-12-16 (×4): qty 1

## 2013-12-16 MED ORDER — ENSURE COMPLETE PO LIQD
237.0000 mL | Freq: Three times a day (TID) | ORAL | Status: DC
  Administered 2013-12-16: 237 mL via ORAL

## 2013-12-16 MED ORDER — ONDANSETRON HCL 4 MG PO TABS: 4.0000 mg | ORAL_TABLET | Freq: Four times a day (QID) | ORAL | Status: DC | PRN

## 2013-12-16 MED ORDER — BUPROPION HCL ER (XL) 150 MG PO TB24: 150.0000 mg | ORAL_TABLET | Freq: Every day | ORAL | Status: DC

## 2013-12-16 MED ORDER — SODIUM CHLORIDE 0.9 % IV SOLN
INTRAVENOUS | Status: DC
  Administered 2013-12-16: 01:00:00 via INTRAVENOUS

## 2013-12-16 MED ORDER — ONDANSETRON HCL 4 MG/2ML IJ SOLN: 4.0000 mg | Freq: Four times a day (QID) | INTRAMUSCULAR | Status: DC | PRN

## 2013-12-16 MED ORDER — ACETAMINOPHEN 325 MG PO TABS
650.0000 mg | ORAL_TABLET | Freq: Four times a day (QID) | ORAL | Status: DC | PRN
  Administered 2013-12-17 – 2013-12-19 (×4): 650 mg via ORAL
  Filled 2013-12-16 (×4): qty 2

## 2013-12-16 MED ORDER — DEXTROSE 5 % IV SOLN
2.0000 g | Freq: Three times a day (TID) | INTRAVENOUS | Status: DC
  Administered 2013-12-16 – 2013-12-19 (×10): 2 g via INTRAVENOUS
  Filled 2013-12-16 (×12): qty 2

## 2013-12-16 MED ORDER — ALBUTEROL SULFATE (2.5 MG/3ML) 0.083% IN NEBU: 2.5000 mg | INHALATION_SOLUTION | Freq: Four times a day (QID) | RESPIRATORY_TRACT | Status: DC | PRN

## 2013-12-16 NOTE — Progress Notes (Signed)
Pt seen and examined at bedside, blood tests reviewed. VSS. Pt admitted after midnight. Please see Dr. Toniann FailKakrakandy note. Transfusion in progress. Dx: vitamin B 12 deficiency.  Debbora PrestoMAGICK-MYERS, ISKRA, MD  [facility] Pager [phone]  If 7PM-7AM, please contact night-coverage [email or web address] Password [email or web address]

## 2013-12-16 NOTE — H&P (Addendum)
[facility] History and Physical  Jill ArthurCandace L Zamor WUJ:[id number] DOB: 05/15/1985 DOA: 12/15/2013  Referring physician: ER physician. PCP: No PCP Per Patient   Chief Complaint: Dizziness.  HPI: Jill Summers is a 28 y.o. female with history of depression and bronchial asthma has been experiencing dizziness over the last 2 weeks. Patient had come to the ER a week ago and was given Antivert which patient had taken couple of days and felt better but since patient was feeling dizzy again and also has been having increasing difficulty walking last 2 days because her lower extremities were giving away patient came back to the ER. In the ER patient was found to be febrile with temperature around 102F and blood work was showing pancytopenia with hemoglobin around 5 platelets around 37. Patient states she had some mild head discomfort last week and she had taken some BC powder. Denies any bleeding per rectum and patient is on her menses now. On call oncologist Dr. Darnelle CatalanMagrinat was consulted and patient has been admitted for further management. On exam patient finds it very weak to walk because she has significant lower extremity weakness. Denies any visual symptoms chest pain or shortness of breath and a productive cough. Has had some nausea and poor appetite but denies any abdominal pain or diarrhea. Patient was treated for boils in June with Bactrim and also had been recently treated for chlamydia couple of months ago. Labs revealed significantly elevated LDH and jaundice concerning for hemolytic process. Patient denies having had any insect bite or recent travel.  Review of Systems: As presented in the history of presenting illness, rest negative.  Past Medical History  Diagnosis Date  . Asthma   . Obesity   . Adopted   . Depression   . Chlamydia   . Bacterial vaginosis   . Urinary tract infection   . MRSA infection    Past Surgical History  Procedure Laterality Date  . Cesarean section     . Cesarean section with bilateral tubal ligation     Social History:  reports that she has never smoked. She has never used smokeless tobacco. She reports that she does not drink alcohol or use illicit drugs. Where does patient live home. Can patient participate in ADLs? Yes.  Allergies  Allergen Reactions  . Peanut-Containing Drug Products Anaphylaxis, Hives and Swelling    *Throat swelling*  . Shellfish Allergy Anaphylaxis, Hives and Swelling    Throat swelling    Family History:  Family History  Problem Relation Age of Onset  . Adopted: Yes  . Hypertension Mother   . Diabetes Father       Prior to Admission medications   Medication Sig Start Date End Date Taking? Authorizing Provider  albuterol (PROVENTIL HFA;VENTOLIN HFA) 108 (90 BASE) MCG/ACT inhaler Inhale 2 puffs into the lungs every 6 (six) hours as needed for wheezing or shortness of breath. For asthma   Yes Historical Provider, MD  cetirizine (ZYRTEC) 10 MG tablet Take 10 mg by mouth at bedtime.    Yes Historical Provider, MD  buPROPion (WELLBUTRIN XL) 150 MG 24 hr tablet Take 150 mg by mouth daily.     Historical Provider, MD  ibuprofen (ADVIL,MOTRIN) 200 MG tablet Take 400 mg by mouth every 8 (eight) hours as needed (for back pain.).    Historical Provider, MD  meclizine (ANTIVERT) 25 MG tablet Take 1 tablet (25 mg total) by mouth 4 (four) times daily as needed. Patient not taking: Reported on 12/15/2013 11/09/13  Ward Givens, MD  mupirocin ointment (BACTROBAN) 2 % Place 1 application into the nose 2 (two) times daily. Patient not taking: Reported on 12/15/2013 10/12/13   Archie Patten, CNM  ondansetron (ZOFRAN) 4 MG tablet Take 1 tablet (4 mg total) by mouth every 8 (eight) hours as needed for nausea or vomiting. Patient not taking: Reported on 12/15/2013 11/09/13   Ward Givens, MD    Physical Exam: Filed Vitals:   12/15/13 2300 12/15/13 2322 12/15/13 2323 12/16/13 0021  BP: 104/46  104/46 121/65  Pulse:    97 100  Temp:  99.9 F (37.7 C)  98.8 F (37.1 C)  TempSrc:  Oral  Oral  Resp: 16  25 22   Height:    5\' 2"  (1.575 m)  Weight:    97.2 kg (214 lb 4.6 oz)  SpO2:   99% 100%     General:  Well-built and nourished.  Eyes: icterus present and mild pallor.  ENT: no discharge from ears eyes nose more but there is brown coating on the tongue.  Neck: no mass felt or neck rigidity.  Cardiovascular: S1-S2 heard.  Respiratory: no rhonchi or crepitations.  Abdomen: soft nontender bowel sounds present.  Skin: no rash. Has multiple tattoos.  Musculoskeletal: no edema.  Psychiatric: appears normal.  Neurologic: alert awake oriented to time place and person. Moves all extremities.  Labs on Admission:  Basic Metabolic Panel:  Recent Labs Lab 12/15/13 2108  NA 140  K 4.1  CL 101  CO2 24  GLUCOSE 98  BUN 14  CREATININE 0.95  CALCIUM 9.0   Liver Function Tests:  Recent Labs Lab 12/15/13 2108  AST 67*  ALT 31  ALKPHOS 25*  BILITOT 3.2*  PROT 7.5  ALBUMIN 4.0   No results for input(s): LIPASE, AMYLASE in the last 168 hours. No results for input(s): AMMONIA in the last 168 hours. CBC:  Recent Labs Lab 12/15/13 2108  WBC 1.4*  NEUTROABS 0.4*  HGB 5.5*  HCT 16.5*  MCV 101.9*  PLT 37*   Cardiac Enzymes: No results for input(s): CKTOTAL, CKMB, CKMBINDEX, TROPONINI in the last 168 hours.  BNP (last 3 results) No results for input(s): PROBNP in the last 8760 hours. CBG: No results for input(s): GLUCAP in the last 168 hours.  Radiological Exams on Admission: No results found.    Assessment/Plan Principal Problem:   Pancytopenia Active Problems:   Vertigo   Fever   Lower extremity weakness   Dizziness   1. Pancytopenia with fever - I have discussed with on-call oncologist Dr. Darnelle Catalan. Differentials include hemolytic anemia versus leukemia versus severe B12 deficiency.Dr. Aaron Edelman has advised to get ANA, direct Coombs test, uric acid levels, B-12  levels. Blood cultures and Lyme titers and RMSF titers and HIV are pending. Stool for occult blood. Since patient has significant anemia 2 units of packed red blood cell transfusion has been ordered as advised by oncologist. Further recommendations per oncologist. 2. Lower extremity weakness - probably secondary to #1. I have discussed with Dr. Hosie Poisson on call neurologist will be seeing patient in consult. CT head is pending. 3. Fever - probably related to #1. See #1. Since patient has significant neutropenia I have placed patient on cefepime. Check influenza PCR. 4. History of asthma - presently not wheezing. 5. History of depression on Wellbutrin.    Code Status: full code.  Family Communication: none.  Disposition Plan: admit to inpatient.   Shahid Flori N. [facility] Pager [phone].  If 7PM-7AM,  please contact night-coverage [email or web address] Password [email or web address] 12/16/2013, 1:11 AM

## 2013-12-16 NOTE — Progress Notes (Signed)
INITIAL NUTRITION ASSESSMENT  DOCUMENTATION CODES Per approved criteria  -Severe malnutrition in the context of acute illness or injury  Pt meets criteria for severe MALNUTRITION in the context of acute illness as evidenced by energy intake <75% for 2 weeks and 11% wt loss x 2 weeks.  INTERVENTION: -Provide Ensure Complete po TID, each supplement provides 350 kcal and 13 grams of protein -Encourage PO intake -RD to continue to monitor  NUTRITION DIAGNOSIS: Inadequate oral intake related to nausea and taste changes as evidenced by pt report of poor PO <25% x 2 weeks.   Goal: Pt to meet >/= 90% of their estimated nutrition needs   Monitor:  PO and supplemental intake, weight, labs, I/O's  Reason for Assessment: Pt identified as at nutrition risk on the Malnutrition Screen Tool  Admitting Dx: Pancytopenia  ASSESSMENT: 28 y.o. female with history of depression and bronchial asthma has been experiencing dizziness over the last 2 weeks.On exam patient finds it very weak to walk because she has significant lower extremity weakness. Has had some nausea and poor appetite but denies any abdominal pain or diarrhea.  Pt reports 15-20 lb weight loss over the last 2 weeks. Per weight history documentation, pt weighed 240 lb in August (11% wt loss x 3 months, this is significant for time frame).  Pt states she has had no appetite and hasn't eaten anything for 2 weeks. States that she is experiencing some nausea and taste changes and food doesn't taste as good anymore. Pt ordered some tomato soup for lunch and ate a few sips. Bowl of soup was observed by RD to still be full.  Pt is interested in trying Ensure supplements between meals. RD to follow-up for PO intake and supplement acceptance.  Nutrition focused physical exam shows no sign of depletion of muscle mass or body fat.  Labs reviewed: Glucose 103  Height: Ht Readings from Last 1 Encounters:  12/16/13 5\' 2"  (1.575 m)     Weight: Wt Readings from Last 1 Encounters:  12/16/13 214 lb 4.6 oz (97.2 kg)    Ideal Body Weight: 110 lb   % Ideal Body Weight: 195%  Wt Readings from Last 10 Encounters:  12/16/13 214 lb 4.6 oz (97.2 kg)  10/12/13 237 lb (107.502 kg)  09/20/13 240 lb (108.863 kg)  09/19/13 240 lb (108.863 kg)  07/05/13 236 lb (107.049 kg)  05/31/13 241 lb 12.8 oz (109.68 kg)  01/19/13 239 lb 6.4 oz (108.591 kg)  08/23/12 210 lb (95.255 kg)  05/22/12 227 lb 3.2 oz (103.057 kg)  03/08/12 230 lb 3.2 oz (104.418 kg)    Usual Body Weight: 230 lb  % Usual Body Weight: 93%  BMI:  Body mass index is 39.18 kg/(m^2).  Estimated Nutritional Needs: Kcal: 1550-1750 Protein: 60-70g Fluid: 1.5L/day  Skin: intact  Diet Order: Diet regular  EDUCATION NEEDS: -No education needs identified at this time   Intake/Output Summary (Last 24 hours) at 12/16/13 0947 Last data filed at 12/16/13 0643  Gross per 24 hour  Intake    850 ml  Output      0 ml  Net    850 ml    Last BM: PTA  Labs:   Recent Labs Lab 12/15/13 2108 12/16/13 0110  NA 140 141  K 4.1 3.8  CL 101 103  CO2 24 22  BUN 14 14  CREATININE 0.95 0.89  CALCIUM 9.0 8.6  GLUCOSE 98 103*    CBG (last 3)  No results for input(s):  GLUCAP in the last 72 hours.  Scheduled Meds: . buPROPion  150 mg Oral Daily  . ceFEPime (MAXIPIME) IV  2 g Intravenous Q8H    Continuous Infusions: . 0.9 % sodium chloride with kcl 75 mL/hr at 12/16/13 0443    Past Medical History  Diagnosis Date  . Asthma   . Obesity   . Adopted   . Depression   . Chlamydia   . Bacterial vaginosis   . Urinary tract infection   . MRSA infection     Past Surgical History  Procedure Laterality Date  . Cesarean section    . Cesarean section with bilateral tubal ligation      Tilda FrancoLindsey Rashida Ladouceur, MS, RD, LDN Pager: [phone] After Hours Pager: [phone]

## 2013-12-16 NOTE — Consult Note (Signed)
Consult Reason for Consult: lower extremity weakness Referring Physician: Dr Izola PriceMyers  CC: lower extremity weakness  HPI: Jill ArthurCandace L Brzoska is an 28 y.o. female for lower extremity weakness and numbness. Symptoms began around 2 days ago, she describes her legs as feeling heavy and weak. Denies a distal or proximal onset, notes it was generalized. States she was unable to walk due to the weakness. Denies any falls. Notes a "numbness" in the distal lower extremity, described as a pins and needles type sensation.This has been a chronic problem.  Denies any UE weakness or sensory changes. No change in bowel/bladder. Had onset of vertigo in October, given meclizine but symptoms did not improve. Denies any EtOH or tobacco usage. Overall healthy.   In ED labs pertinent for severe pancytopenia (WBC 1.4, ANC 0.4, Hb 5.5, MCV 101.9, platelets 37,000). Other pertinent labs are LDH 4300, AST 67, t bilirubin 3.2. HIV and RPR non-reactive. Of note, a B12 from 05/2011 was 50. Unclear if she ever received B12.   Past Medical History  Diagnosis Date  . Asthma   . Obesity   . Adopted   . Depression   . Chlamydia   . Bacterial vaginosis   . Urinary tract infection   . MRSA infection     Past Surgical History  Procedure Laterality Date  . Cesarean section    . Cesarean section with bilateral tubal ligation      Family History  Problem Relation Age of Onset  . Adopted: Yes  . Hypertension Mother   . Diabetes Father     Social History:  reports that she has never smoked. She has never used smokeless tobacco. She reports that she does not drink alcohol or use illicit drugs.  Allergies  Allergen Reactions  . Peanut-Containing Drug Products Anaphylaxis, Hives and Swelling    *Throat swelling*  . Shellfish Allergy Anaphylaxis, Hives and Swelling    Throat swelling    Medications:  Scheduled: . buPROPion  150 mg Oral Daily  . ceFEPime (MAXIPIME) IV  2 g Intravenous Q8H    ROS: Out of a complete  14 system review, the patient complains of only the following symptoms, and all other reviewed systems are negative. +weakness, numbness, gait instability, fatigue Physical Examination: Blood pressure 130/66, pulse 80, temperature 98.3 F (36.8 C), temperature source Oral, resp. rate 16, height 5\' 2"  (1.575 m), weight 97.2 kg (214 lb 4.6 oz), last menstrual period 12/13/2013, SpO2 100 %.  Neurologic Examination Mental Status: Alert, oriented, thought content appropriate.  Speech fluent without evidence of aphasia.  Able to follow 3 step commands without difficulty. Cranial Nerves: II: funduscopic exam wnl bilaterally, visual fields grossly normal, pupils equal, round, reactive to light and accommodation III,IV, VI: ptosis not present, extra-ocular motions intact bilaterally V,VII: smile symmetric, facial light touch sensation normal bilaterally VIII: hearing normal bilaterally IX,X: gag reflex present XI: trapezius strength/neck flexion strength normal bilaterally XII: tongue strength normal  Motor: Right : Upper extremity    Left:     Upper extremity 5/5 deltoid       5/5 deltoid 5/5 biceps      5/5 biceps  5/5 triceps      5/5 triceps 5/5 hand grip      5/5 hand grip  Lower extremity     Lower extremity 4-/5 hip flexor      3/5 hip flexor 4-/5 quadricep      3/5 quadriceps  4-/5 hamstrings     4-/5 hamstrings 4+/5 plantar flexion  4+/5 plantar flexion 5-/5 plantar extension     5-/5 plantar extension Tone and bulk:normal tone throughout; no atrophy noted Sensory: Pinprick and light touch intact throughout, bilaterally. Decreased vibration and proprioception bilateral lower extremities Deep Tendon Reflexes: reflexes absent throughout Plantars: Right: downgoing   Left: downgoing Cerebellar: normal finger-to-nose, normal rapid alternating movements. Unable to assess HTS due to weakness Gait: unable to assess due to weakness  Laboratory Studies:   Basic Metabolic  Panel:  Recent Labs Lab 12/15/13 2108 12/16/13 0110  NA 140 141  K 4.1 3.8  CL 101 103  CO2 24 22  GLUCOSE 98 103*  BUN 14 14  CREATININE 0.95 0.89  CALCIUM 9.0 8.6    Liver Function Tests:  Recent Labs Lab 12/15/13 2108 12/16/13 0110  AST 67* 62*  ALT 31 27  ALKPHOS 25* 24*  BILITOT 3.2* 3.1*  PROT 7.5 6.8  ALBUMIN 4.0 3.6   No results for input(s): LIPASE, AMYLASE in the last 168 hours. No results for input(s): AMMONIA in the last 168 hours.  CBC:  Recent Labs Lab 12/15/13 2108 12/16/13 0110  WBC 1.4* 1.1*  NEUTROABS 0.4* 0.3*  HGB 5.5* 5.0*  HCT 16.5* 14.6*  MCV 101.9* 102.1*  PLT 37* 30*    Cardiac Enzymes: No results for input(s): CKTOTAL, CKMB, CKMBINDEX, TROPONINI in the last 168 hours.  BNP: Invalid input(s): POCBNP  CBG: No results for input(s): GLUCAP in the last 168 hours.  Microbiology: Results for orders placed or performed during the hospital encounter of 12/15/13  MRSA PCR Screening     Status: None   Collection Time: 12/16/13  1:35 AM  Result Value Ref Range Status   MRSA by PCR NEGATIVE NEGATIVE Final    Comment:        The GeneXpert MRSA Assay (FDA approved for NASAL specimens only), is one component of a comprehensive MRSA colonization surveillance program. It is not intended to diagnose MRSA infection nor to guide or monitor treatment for MRSA infections.     Coagulation Studies:  Recent Labs  12/15/13 2108  LABPROT 15.3*  INR 1.20    Urinalysis: No results for input(s): COLORURINE, LABSPEC, PHURINE, GLUCOSEU, HGBUR, BILIRUBINUR, KETONESUR, PROTEINUR, UROBILINOGEN, NITRITE, LEUKOCYTESUR in the last 168 hours.  Invalid input(s): APPERANCEUR  Lipid Panel:  No results found for: CHOL, TRIG, HDL, CHOLHDL, VLDL, LDLCALC  HgbA1C: No results found for: HGBA1C  Urine Drug Screen:     Component Value Date/Time   LABOPIA NONE DETECTED 08/09/2006 1737   COCAINSCRNUR NONE DETECTED 08/09/2006 1737   LABBENZ NONE  DETECTED 08/09/2006 1737   AMPHETMU NONE DETECTED 08/09/2006 1737   THCU NONE DETECTED 08/09/2006 1737   LABBARB  08/09/2006 1737    NONE DETECTED        DRUG SCREEN FOR MEDICAL PURPOSES ONLY.  IF CONFIRMATION IS NEEDED FOR ANY PURPOSE, NOTIFY LAB WITHIN 5 DAYS.    Alcohol Level: No results for input(s): ETH in the last 168 hours.   Imaging: Ct Head Wo Contrast  12/16/2013   CLINICAL DATA:  Dizziness and weakness of both lower extremities.  EXAM: CT HEAD WITHOUT CONTRAST  TECHNIQUE: Contiguous axial images were obtained from the base of the skull through the vertex without intravenous contrast.  COMPARISON:  None.  FINDINGS: Ventricles and sulci appear symmetrical. No mass effect or midline shift. No abnormal extra-axial fluid collections. Gray-white matter junctions are distinct. Basal cisterns are not effaced. No evidence of acute intracranial hemorrhage. No depressed skull fractures. Visualized paranasal sinuses  and mastoid air cells are not opacified.  IMPRESSION: Normal   Electronically Signed   By: Burman NievesWilliam  Stevens M.D.   On: 12/16/2013 01:29     Assessment/Plan:  28y/o woman presenting with chronic lower extremity paresthesias and new onset lower extremity weakness. Found to have a profound pancytopenia in the ED.History of low B12 with level from 2013 found to be 50, this combined with pancytopenia raises concern for possible subacute combined degeneration (though weakness onset is rapid for this). RPR is negative. Areflexia with weakness raises concern for possible GBS though this is less likely based on history. Differential would also include autoimmune, infectious, or hematological disorder.  -check B12, folate, ANA, serum copper, zinc -check MRI T and L spine with and without contrast -agree with planned B12 injections -appreciate hematology input -due to thrombocytopenia will hold on LP at this time -will continue to follow  Elspeth ChoPeter Cosimo Schertzer,  DO [facility] [phone]  If 7pm- 7am, please page neurology on call as listed in AMION. 12/16/2013, 5:56 AM

## 2013-12-16 NOTE — Progress Notes (Signed)
ANTIBIOTIC CONSULT NOTE - INITIAL  Pharmacy Consult for Cefepime Indication: Febrile Neutropenia  Allergies  Allergen Reactions  . Peanut-Containing Drug Products Anaphylaxis, Hives and Swelling    *Throat swelling*  . Shellfish Allergy Anaphylaxis, Hives and Swelling    Throat swelling    Patient Measurements: Height: 5\' 2"  (157.5 cm) Weight: 214 lb 4.6 oz (97.2 kg) IBW/kg (Calculated) : 50.1  Vital Signs: Temp: 98.2 F (36.8 C) (11/17 0145) Temp Source: Oral (11/17 0145) BP: 112/58 mmHg (11/17 0145) Pulse Rate: 87 (11/17 0145) Intake/Output from previous day: 11/16 0701 - 11/17 0700 In: 250 [I.V.:250] Out: -  Intake/Output from this shift: Total I/O In: 250 [I.V.:250] Out: -   Labs:  Recent Labs  12/15/13 2108  WBC 1.4*  HGB 5.5*  PLT 37*  CREATININE 0.95   Estimated Creatinine Clearance: 95 mL/min (by C-G formula based on Cr of 0.95). No results for input(s): VANCOTROUGH, VANCOPEAK, VANCORANDOM, GENTTROUGH, GENTPEAK, GENTRANDOM, TOBRATROUGH, TOBRAPEAK, TOBRARND, AMIKACINPEAK, AMIKACINTROU, AMIKACIN in the last 72 hours.   Microbiology: No results found for this or any previous visit (from the past 720 hour(s)).  Medical History: Past Medical History  Diagnosis Date  . Asthma   . Obesity   . Adopted   . Depression   . Chlamydia   . Bacterial vaginosis   . Urinary tract infection   . MRSA infection     Medications:  Scheduled:   Infusions:  . sodium chloride 75 mL/hr at 12/16/13 0125   Assessment:  [age] yr female with h/o asthma and depression presents with complaint of dizziness and lower extremity weakness.  Labs show pancytopenia.  Oncology consulted  Blood cultures ordered  Pharmacy requested to dose Cefepime for febrile neutropenia  Goal of Therapy:  Eradication of infection  Plan:  Cefepime 2gm IV q8h Follow up cultures & sensitivities  Essica Kiker, Joselyn GlassmanLeann Trefz, PharmD 12/16/2013,1:56 AM

## 2013-12-16 NOTE — Progress Notes (Signed)
CARE MANAGEMENT NOTE 12/16/2013  Patient:  Jill Summers   Account Number:  [id number]  Date Initiated:  12/16/2013  Documentation initiated by:  Ferdinand CavaSCHETTINO,Airiana Elman  Subjective/Objective Assessment:   28 yo female admitted with Principal Problem:    Pancytopenia  Active Problems:    Vertigo    Fever    Lower extremity weakness    Dizziness     Action/Plan:   discharge planning   Anticipated DC Date:  12/17/2013   Anticipated DC Plan:  HOME/SELF CARE      DC Planning Services  CM consult  PCP issues      Choice offered to / List presented to:  [name] Patient           Status of service:  Completed, signed off Medicare Important Message given?   (If response is "NO", the following Medicare IM given date fields will be blank) Date Medicare IM given:   Medicare IM given by:   Date Additional Medicare IM given:   Additional Medicare IM given by:    Discharge Disposition:  HOME/SELF CARE  Per UR Regulation:    If discussed at Long Length of Stay Meetings, dates discussed:    Comments:  12/16/13 Ferdinand CavaAndrea Schettino RN, BSN, CM [phone] Spoke with patient and she stated that she has a [facility]atl health provider [facility] but not PCP. Provided the patient with a list of PCP's accepting Medicaid and also [facility] and encouraged her to follow up with the [facility] becasue they are accepting new patients with Medicaid. Patient's mother was present in the room and assist with transportation and will provide transportation home upon discharge.

## 2013-12-16 NOTE — Consult Note (Signed)
[facility]  [phone] [phone]     ID: Jill ArthurCandace L Summers DOB: [birth year]  MR#: [id number]  ZHY#:[id number]#:[id number]  Patient Care Team: No Pcp Per Patient as PCP - General (General Practice) PCP: No PCP Per Patient .  CHIEF COMPLAINT: leg weakness; fever; pancytopenia  CURRENT TREATMENT: ongoing evaluation  HISTORY OF PRESENT ILLNESS: The patient tells me she had boils on the inside of her thighs; these were lanced (dates?). On 10/12/2013 she was evaluated in the ED and fund to have cellulitis of the umbilicus. She was treated with bactrim. In )ctober she was evaluated in the ED with vertigo. She was treated with meclizine but "it never went away." We don't have a CBC from that visit. Yesterday the patient found she "couldn't walk." Her legs felt numb. She mostly stayed in bed, but as things did not improve she presented tot he ED 11/14/2013 with unremarkable vitals but severe pancytopenia (WBC 1.4, ANC 0.4, Hb 5.5, MCV 101.9, platelets 37K).   Labwork from 06/24/2011 showed a WBC of 4.3, Hb 9.3, MCV 105.6 and platelets 116K. Ferritin at that time was 46, folate >20, but B-12 50.  Other labwork so far this admission includes an LDH of 4300, T.Bil 3.2, AST 67, ALT 31, normal creatinine, normal PTT, PT/INR 1.20, and a negative antibody screen  INTERVAL HISTORY: Patient was examined in her room; no family present  REVIEW OF SYSTEMS: She has had mild headaches and some nausea, no vomiting; no visual changes; still "dizzyheaded." No cough, SOB or pleurisy. No change in bowel or bladder habits. No rash that she is aware of. Weake legs. Asked about areflexia she says "I've never had reflexes." ROS otherwise non contributory.  PAST MEDICAL HISTORY: Past Medical History  Diagnosis Date  . Asthma   . Obesity   . Adopted   . Depression   . Chlamydia   . Bacterial vaginosis   . Urinary tract infection   . MRSA infection     PAST SURGICAL HISTORY: Past  Surgical History  Procedure Laterality Date  . Cesarean section    . Cesarean section with bilateral tubal ligation      FAMILY HISTORY Family History  Problem Relation Age of Onset  . Adopted: Yes  . Hypertension Mother   . Diabetes Father     GYNECOLOGIC HISTORY:  Patient's last menstrual period was 12/13/2013.  Menarche age 28, she is GX P2, LMP just ending  SOCIAL HISTORY:  Disabled due to asthma; lives with 703 y/o son Jill Summers. Mother Jill Summers lives next door--she is a retired [employer] SW-- with patient's 8 y/o child, Quarry managerCamari.    ADVANCED DIRECTIVES: not in place   HEALTH MAINTENANCE: History  Substance Use Topics  . Smoking status: Never Smoker   . Smokeless tobacco: Never Used  . Alcohol Use: No       Allergies  Allergen Reactions  . Peanut-Containing Drug Products Anaphylaxis, Hives and Swelling    *Throat swelling*  . Shellfish Allergy Anaphylaxis, Hives and Swelling    Throat swelling    Current Facility-Administered Medications  Medication Dose Route Frequency Provider Last Rate Last Dose  . 0.9 %  sodium chloride infusion   Intravenous Continuous Eduard ClosArshad N Kakrakandy, MD 75 mL/hr at 12/16/13 0125    . acetaminophen (TYLENOL) tablet 650 mg  650 mg Oral Q6H PRN Eduard ClosArshad N Kakrakandy, MD       Or  . acetaminophen (TYLENOL) suppository 650 mg  650 mg Rectal Q6H PRN Meryle ReadyArshad N  Toniann Fail, MD      . albuterol (PROVENTIL) (2.5 MG/3ML) 0.083% nebulizer solution 2.5 mg  2.5 mg Inhalation Q6H PRN Eduard Clos, MD      . ondansetron [facility]) tablet 4 mg  4 mg Oral Q6H PRN Eduard Clos, MD       Or  . ondansetron [facility]) injection 4 mg  4 mg Intravenous Q6H PRN Eduard Clos, MD        OBJECTIVE: young African American woman examined in bed Filed Vitals:   12/16/13 0021  BP: 121/65  Pulse: 100  Temp: 98.8 F (37.1 C)  Resp: 22     Body mass index is 39.18 kg/(m^2).    ECOG FS:3 - Symptomatic, >50% confined to bed  Ocular: Sclerae  Mildly  icteric, pupils equal, and round Ear-nose-throat: Oropharynx clear, slightly dry Lymphatic: No cervical or supraclavicular adenopathy Lungs no rales or rhonchi, auscultated anterolaterally Heart regular rate and rhythm Abd soft, obese, nontender, positive bowel sounds Skin: no obvious rash Neuro: well-oriented, appropriate affect; able to raise B LE to about 40 degrees; dorsiflexion 5/5 bilaterally    LAB RESULTS:  CMP     Component Value Date/Time   NA 140 12/15/2013 2108   K 4.1 12/15/2013 2108   CL 101 12/15/2013 2108   CO2 24 12/15/2013 2108   GLUCOSE 98 12/15/2013 2108   BUN 14 12/15/2013 2108   CREATININE 0.95 12/15/2013 2108   CALCIUM 9.0 12/15/2013 2108   PROT 7.5 12/15/2013 2108   ALBUMIN 4.0 12/15/2013 2108   AST 67* 12/15/2013 2108   ALT 31 12/15/2013 2108   ALKPHOS 25* 12/15/2013 2108   BILITOT 3.2* 12/15/2013 2108   GFRNONAA 80* 12/15/2013 2108   GFRAA >90 12/15/2013 2108    INo results found for: SPEP, UPEP  Lab Results  Component Value Date   WBC 1.4* 12/15/2013   NEUTROABS 0.4* 12/15/2013   HGB 5.5* 12/15/2013   HCT 16.5* 12/15/2013   MCV 101.9* 12/15/2013   PLT 37* 12/15/2013    @LASTCHEMISTRY @  No results found for: LABCA2  No components found for: LABCA125   Recent Labs Lab 12/15/13 2108  INR 1.20    Urinalysis    Component Value Date/Time   COLORURINE YELLOW 10/12/2013 1400   APPEARANCEUR CLEAR 10/12/2013 1400   LABSPEC 1.020 10/12/2013 1400   PHURINE 6.0 10/12/2013 1400   GLUCOSEU NEGATIVE 10/12/2013 1400   HGBUR LARGE* 10/12/2013 1400   BILIRUBINUR NEGATIVE 10/12/2013 1400   KETONESUR NEGATIVE 10/12/2013 1400   PROTEINUR NEGATIVE 10/12/2013 1400   UROBILINOGEN 1.0 10/12/2013 1400   NITRITE NEGATIVE 10/12/2013 1400   LEUKOCYTESUR TRACE* 10/12/2013 1400    STUDIES: Ct Head Wo Contrast  12/16/2013   CLINICAL DATA:  Dizziness and weakness of both lower extremities.  EXAM: CT HEAD WITHOUT CONTRAST  TECHNIQUE: Contiguous  axial images were obtained from the base of the skull through the vertex without intravenous contrast.  COMPARISON:  None.  FINDINGS: Ventricles and sulci appear symmetrical. No mass effect or midline shift. No abnormal extra-axial fluid collections. Gray-white matter junctions are distinct. Basal cisterns are not effaced. No evidence of acute intracranial hemorrhage. No depressed skull fractures. Visualized paranasal sinuses and mastoid air cells are not opacified.  IMPRESSION: Normal   Electronically Signed   By: Burman Nieves M.D.   On: 12/16/2013 01:29    ASSESSMENT: 28 y.o. [place] woman presenting with inability to walk, fever and pancytopenia  Blood film shows no immature white cells--no suggestion of leukemia,  though that remains in the differential. The polys tend to be HYPO segmented--one expects hypersegmentation in severe B 12 deficiency. Low platelet count. No platelet clumps. Red cells show extreme anisocytosis, including teardrops, but only rare schistocytes. There is some polychromasia and nucleated RBCs, including some bizarre forms (binucleated). No macro-ovalocytes.  PLAN: The differential includes drug reaction, SLE, infection, and primary bone marrow diseases including leukemia or aplastic anemia. However I think the best hypothesis is severe B-12 deficiency. This would account for the numbness and weakness in the legs (early subacute combined degeneration?), many of the findings on blood film, the pancytopenia and high LDH and bilirubin (ineffective hematopoiesis).  While waiting for confirmation, I would proceed with transfusion as you are planning. I will also give B-12. This can cause low potassium and I will add potassium replacement to orders as well.  The patient has a good understanding of the overall plan. She agrees with it. She knows the goal of treatment in her case is cure.   We will follow with you.  Lowella DellMAGRINAT,GUSTAV C, MD   12/16/2013 1:44 AM

## 2013-12-17 ENCOUNTER — Inpatient Hospital Stay (HOSPITAL_COMMUNITY): Payer: Medicaid Other

## 2013-12-17 DIAGNOSIS — E43 Unspecified severe protein-calorie malnutrition: Secondary | ICD-10-CM | POA: Insufficient documentation

## 2013-12-17 LAB — BASIC METABOLIC PANEL
Anion gap: 13 (ref 5–15)
BUN: 7 mg/dL (ref 6–23)
CO2: 22 mEq/L (ref 19–32)
Calcium: 8.4 mg/dL (ref 8.4–10.5)
Chloride: 106 mEq/L (ref 96–112)
Creatinine, Ser: 0.79 mg/dL (ref 0.50–1.10)
GFR calc Af Amer: >90 mL/min (ref >90)
GFR calc non Af Amer: >90 mL/min (ref >90)
Glucose, Bld: 101 mg/dL — ABNORMAL HIGH (ref 70–99)
Potassium: 4 mEq/L (ref 3.7–5.3)
Sodium: 141 mEq/L (ref 137–147)

## 2013-12-17 LAB — CBC
HCT: 22.3 % — ABNORMAL LOW (ref 36.0–46.0)
Hemoglobin: 7.8 g/dL — ABNORMAL LOW (ref 12.0–15.0)
MCH: 33.6 pg (ref 26.0–34.0)
MCHC: 35 g/dL (ref 30.0–36.0)
MCV: 96.1 fL (ref 78.0–100.0)
Platelets: 28 10*3/uL — CL (ref 150–400)
RBC: 2.32 MIL/uL — ABNORMAL LOW (ref 3.87–5.11)
RDW: 23.6 % — ABNORMAL HIGH (ref 11.5–15.5)
WBC: 1.5 10*3/uL — ABNORMAL LOW (ref 4.0–10.5)

## 2013-12-17 LAB — B. BURGDORFI ANTIBODIES: B burgdorferi Ab IgG+IgM: 0.64 {ISR}

## 2013-12-17 LAB — ANA: Anti Nuclear Antibody(ANA): NEGATIVE

## 2013-12-17 MED ORDER — GADOBENATE DIMEGLUMINE 529 MG/ML IV SOLN
20.0000 mL | Freq: Once | INTRAVENOUS | Status: AC | PRN
  Administered 2013-12-17: 20 mL via INTRAVENOUS

## 2013-12-17 NOTE — Progress Notes (Addendum)
Subjective: Patient had no new complaints. Paresthesias in lower extremities are less intense than at the time of admission. She also has a return of voluntary movement in lower extremities.  Objective: Current vital signs: BP 97/55 mmHg  Pulse 88  Temp(Src) 99.8 F (37.7 C) (Oral)  Resp 20  Ht 5\' 2"  (1.575 m)  Wt 97.2 kg (214 lb 4.6 oz)  BMI 39.18 kg/m2  SpO2 99%  LMP 12/13/2013  Neurologic Exam: Patient was alert and in no acute distress. Mental status was normal. Speech was normal. No weakness of upper extremities noted. Hip flexor strength was 3/5 bilaterally; quadriceps and hamstrings were 4/5; right tibialis anterior was 4/5 and left-sided tibialis anterior 5/5; plantar flexors were 5/5 bilaterally. Deep tendon reflexes were absent at knees and ankles, and were trace only in upper extremities. There was absence of vibratory sensation at knees and ankles, but normal in upper extremities.  Medications: I have reviewed the patient's current medications.  Assessment/Plan: 28 year old lady presenting with acute paraplegia and pancytopenia with previously untreated vitamin B 12 deficiency. Subacute combined degeneration is likely etiology for lower extremity weakness, as well as lower extremity sensory changes. GBS is less likely. Patient is showing improvement in strength of lower extremities fairly rapidly, without immunotherapy. Parenteral vitamin B-12 replacement has become begun. MRI studies of the lumbosacral and thoracic spine are pending.  Recommend no changes in medical management at this point. Patient will need physical therapy intervention and possible inpatient rehabilitation prior to returning home.  We'll continue to follow this patient with you.  C.R. Roseanne RenoStewart, MD [facility] [phone]  12/17/2013  10:45 AM

## 2013-12-17 NOTE — Progress Notes (Signed)
Patient ID: Jill Summers, female   DOB: 09-Sep-1985, 28 y.o.   MRN: [id number]  [facility] PROGRESS NOTE  Jill Summers YQM:[id number] DOB: 09-Sep-1985 DOA: 12/15/2013 PCP: No PCP Per Patient  Brief narrative: The patient is 28 yo female who [facility] sudden onset of lower extremity weakness and numbness, unable to walk.She has recently had a boils on the inside of her thighs which were lanced, she has also developed cellulitis of the umbilicus.   In ED, VSS but blood work notable for severe pancytopenia (WBC 1.4, ANC 0.4, Hb 5.5, MCV 101.9, platelets 37K). Labwork from 06/24/2011 showed a WBC of 4.3, Hb 9.3, MCV 105.6 and platelets 116K. Ferritin at that time was 46, folate >20, but B-12 50. Other labwork so far this admission includes an LDH of 4300, T.Bil 3.2, AST 67, ALT 31, normal creatinine, normal PTT, PT/INR 1.20, and a negative antibody screen   Assessment and Plan:    Principal Problem:   Pancytopenia - blood counts improving post transfusion except platelets which are trending down  - etiology is stil fairly unclear and so far thought to be secondary to B12 deficiency  - will hold off on any transfusions today and will repeat CBC in AM to see if it remains stable - continue to follow up on hematology recommendations, we appreciate assistance  Active Problems:   Vit B12 deficiency - continue supplementing   LE weakness and numbness - MRI of the lumbar and thoracic spine to be done today    Fever with neutropenia  - not clear if there is any source yet and unsure if related to principal problem  - possible UTI based on UA, will follow up on urine culture - continue Maxipime day #2 for now as temp still 99.8 F on Maxipime   DVT prophylaxis  SCD's  Code Status: Full Family Communication: Pt at bedside Disposition Plan: Home when medically stable  IV Access:   Peripheral IV Procedures and diagnostic studies:    Ct Head Wo Contrast  12/16/2013    Normal    Medical Consultants:   Neurology Hematology Other Consultants:   Physical therapy  Anti-Infectives:   Maxipime 11/17 -->  Debbora PrestoMAGICK-MYERS, ISKRA, MD  [facility] Pager [phone]  If 7PM-7AM, please contact night-coverage [email or web address] Password [email or web address] 12/17/2013, 3:39 PM   LOS: 2 days   HPI/Subjective: No events overnight.   Objective: Filed Vitals:   12/16/13 0915 12/16/13 1437 12/16/13 2049 12/17/13 0524  BP: 124/78 114/69 114/66 97/55  Pulse: 94 86 85 88  Temp: 100.3 F (37.9 C) 98.7 F (37.1 C) 99.1 F (37.3 C) 99.8 F (37.7 C)  TempSrc: Oral Oral Oral Oral  Resp: 20 158 19 20  Height:      Weight:      SpO2: 100% 100% 100% 99%    Intake/Output Summary (Last 24 hours) at 12/17/13 1539 Last data filed at 12/17/13 0900  Gross per 24 hour  Intake   1935 ml  Output    300 ml  Net   1635 ml    Exam:   General:  Pt is alert, follows commands appropriately, not in acute distress  Cardiovascular: Regular rate and rhythm, S1/S2, no murmurs, no rubs, no gallops  Respiratory: Clear to auscultation bilaterally, no wheezing, no crackles, no rhonchi  Abdomen: Soft, non tender, non distended, bowel sounds present, no guarding  Extremities: No edema, pulses DP and PT palpable bilaterally  Neuro: LE weakness bilateral   Data Reviewed: Basic  Metabolic Panel:  Recent Labs Lab 12/15/13 2108 12/16/13 0110 12/17/13 0425  NA 140 141 141  K 4.1 3.8 4.0  CL 101 103 106  CO2 24 22 22   GLUCOSE 98 103* 101*  BUN 14 14 7   CREATININE 0.95 0.89 0.79  CALCIUM 9.0 8.6 8.4   Liver Function Tests:  Recent Labs Lab 12/15/13 2108 12/16/13 0110  AST 67* 62*  ALT 31 27  ALKPHOS 25* 24*  BILITOT 3.2* 3.1*  PROT 7.5 6.8  ALBUMIN 4.0 3.6   CBC:  Recent Labs Lab 12/15/13 2108 12/16/13 0110 12/17/13 0425  WBC 1.4* 1.1* 1.5*  NEUTROABS 0.4* 0.3*  --   HGB 5.5* 5.0* 7.8*  HCT 16.5* 14.6* 22.3*  MCV 101.9* 102.1* 96.1  PLT 37* 30* 28*     Recent Results  (from the past 240 hour(s))  Blood culture (routine x 2)     Status: None (Preliminary result)   Collection Time: 12/15/13  9:08 PM  Result Value Ref Range Status   Specimen Description BLOOD LEFT ANTECUBITAL  Final   Special Requests BOTTLES DRAWN AEROBIC AND ANAEROBIC 5CC  Final   Culture  Setup Time   Final    12/16/2013 00:55 [facility]    Culture   Final           BLOOD CULTURE RECEIVED NO GROWTH TO DATE CULTURE WILL BE HELD FOR 5 DAYS BEFORE ISSUING A FINAL NEGATIVE REPORT [facility]formed at [facility]    Report Status PENDING  Incomplete  Blood culture (routine x 2)     Status: None (Preliminary result)   Collection Time: 12/15/13  9:08 PM  Result Value Ref Range Status   Specimen Description BLOOD RIGHT ANTECUBITAL  Final   Special Requests BOTTLES DRAWN AEROBIC AND ANAEROBIC 3CC  Final   Culture  Setup Time   Final    12/16/2013 00:55 Performed at [facility]    Culture   Final           BLOOD CULTURE RECEIVED NO GROWTH TO DATE CULTURE WILL BE HELD FOR 5 DAYS BEFORE ISSUING A FINAL NEGATIVE REPORT Performed at [facility]    Report Status PENDING  Incomplete  MRSA PCR Screening     Status: None   Collection Time: 12/16/13  1:35 AM  Result Value Ref Range Status   MRSA by PCR NEGATIVE NEGATIVE Final    Comment:        The GeneXpert MRSA Assay (FDA approved for NASAL specimens only), is one component of a comprehensive MRSA colonization surveillance program. It is not intended to diagnose MRSA infection nor to guide or monitor treatment for MRSA infections.      Scheduled Meds: . buPROPion  150 mg Oral Daily  . ceFEPime (MAXIPIME) IV  2 g Intravenous Q8H  . feeding supplement (ENSURE COMPLETE)  237 mL Oral TID BM   Continuous Infusions: . 0.9 % sodium chloride with kcl 75 mL/hr at 12/16/13 2328

## 2013-12-18 ENCOUNTER — Inpatient Hospital Stay (HOSPITAL_COMMUNITY): Payer: Medicaid Other

## 2013-12-18 DIAGNOSIS — G8222 Paraplegia, incomplete: Secondary | ICD-10-CM

## 2013-12-18 DIAGNOSIS — E538 Deficiency of other specified B group vitamins: Secondary | ICD-10-CM

## 2013-12-18 DIAGNOSIS — N319 Neuromuscular dysfunction of bladder, unspecified: Secondary | ICD-10-CM

## 2013-12-18 DIAGNOSIS — R29898 Other symptoms and signs involving the musculoskeletal system: Secondary | ICD-10-CM | POA: Insufficient documentation

## 2013-12-18 LAB — CBC
HCT: 22 % — ABNORMAL LOW (ref 36.0–46.0)
Hemoglobin: 7.6 g/dL — ABNORMAL LOW (ref 12.0–15.0)
MCH: 33 pg (ref 26.0–34.0)
MCHC: 34.5 g/dL (ref 30.0–36.0)
MCV: 95.7 fL (ref 78.0–100.0)
Platelets: 21 10*3/uL — CL (ref 150–400)
RBC: 2.3 MIL/uL — ABNORMAL LOW (ref 3.87–5.11)
RDW: 22.4 % — ABNORMAL HIGH (ref 11.5–15.5)
WBC: 1.7 10*3/uL — ABNORMAL LOW (ref 4.0–10.5)

## 2013-12-18 LAB — TYPE AND SCREEN
ABO/RH(D): A POS
Antibody Screen: NEGATIVE
Unit division: 0
Unit division: 0

## 2013-12-18 LAB — BASIC METABOLIC PANEL
Anion gap: 13 (ref 5–15)
BUN: 6 mg/dL (ref 6–23)
CO2: 22 mEq/L (ref 19–32)
Calcium: 8.7 mg/dL (ref 8.4–10.5)
Chloride: 105 mEq/L (ref 96–112)
Creatinine, Ser: 0.71 mg/dL (ref 0.50–1.10)
GFR calc Af Amer: >90 mL/min (ref >90)
GFR calc non Af Amer: >90 mL/min (ref >90)
Glucose, Bld: 96 mg/dL (ref 70–99)
Potassium: 3.7 mEq/L (ref 3.7–5.3)
Sodium: 140 mEq/L (ref 137–147)

## 2013-12-18 MED ORDER — LORAZEPAM 2 MG/ML IJ SOLN
1.0000 mg | Freq: Two times a day (BID) | INTRAMUSCULAR | Status: DC | PRN
Start: 2013-12-18 — End: 2013-12-19
  Administered 2013-12-18: 1 mg via INTRAVENOUS
  Filled 2013-12-18: qty 1

## 2013-12-18 MED ORDER — GADOBENATE DIMEGLUMINE 529 MG/ML IV SOLN
20.0000 mL | Freq: Once | INTRAVENOUS | Status: AC | PRN
  Administered 2013-12-18: 20 mL via INTRAVENOUS

## 2013-12-18 NOTE — Consult Note (Signed)
Physical Medicine and Rehabilitation Consult Reason for Consult: Debilitation/pancytopenia Referring Physician: [facility]   HPI: Jill Summers is a 28 y.o. right handed female with history of depression and bronchial asthma as well as recent cellulitis of the umbilicus and treated with Bactrim in September 2015 as well as recent MRSA PCR screen +2 months ago.  Patient lives with children independent prior to admission and her mother has been assisting with care of the children recently. Admitted 12/16/2013 with bouts of dizziness over the past 2 weeks as well as lower strandy weakness and numbness. She denied any falls. No change in bowel or bladder. Cranial CT scan negative. MRI of thoracic lumbar spine showed multiple bilateral thoracic cord lesions in the lateral white matter most compatible with demyelinating disease of question etiology and workup ongoing as per neurology services  and fell cord lesions most likely manifestations of B-12 deficiency. Partial visualization of lower cervical cord shows white matter lesions with full cervical spine films pending. MRI of the brain and cervical spinal cord are pending. Found to have severe pancytopenia with WBC 1.4, ANC 0.4, hemoglobin 5.5, MCV 101.9, platelets 37,000. Hematology service is consulted with workup ongoing. She was transfused. Etiology still remains unclear and so far felt to be secondary to B-12 deficiency and currently maintained on supplementation. Patient remains on broad-spectrum antibiotics. Physical therapy evaluation completed 12/18/2013 with recommendations of physical medicine rehabilitation consult.  Patient complains of bladder urgency. No bowel issues She states numbness is from the knee down to her feet. Denies any hand symptoms. Review of Systems  Constitutional: Positive for fever.  Neurological: Positive for dizziness, tingling and weakness.  All other systems reviewed and are negative.  Past Medical History    Diagnosis Date  . Asthma   . Obesity   . Adopted   . Depression   . Chlamydia   . Bacterial vaginosis   . Urinary tract infection   . MRSA infection    Past Surgical History  Procedure Laterality Date  . Cesarean section    . Cesarean section with bilateral tubal ligation     Family History  Problem Relation Age of Onset  . Adopted: Yes  . Hypertension Mother   . Diabetes Father    Social History:  reports that she has never smoked. She has never used smokeless tobacco. She reports that she does not drink alcohol or use illicit drugs. Allergies:  Allergies  Allergen Reactions  . Peanut-Containing Drug Products Anaphylaxis, Hives and Swelling    *Throat swelling*  . Shellfish Allergy Anaphylaxis, Hives and Swelling    Throat swelling   Medications Prior to Admission  Medication Sig Dispense Refill  . albuterol (PROVENTIL HFA;VENTOLIN HFA) 108 (90 BASE) MCG/ACT inhaler Inhale 2 puffs into the lungs every 6 (six) hours as needed for wheezing or shortness of breath. For asthma    . cetirizine (ZYRTEC) 10 MG tablet Take 10 mg by mouth at bedtime.     Marland Kitchen buPROPion (WELLBUTRIN XL) 150 MG 24 hr tablet Take 150 mg by mouth daily.     Marland Kitchen ibuprofen (ADVIL,MOTRIN) 200 MG tablet Take 400 mg by mouth every 8 (eight) hours as needed (for back pain.).    Marland Kitchen meclizine (ANTIVERT) 25 MG tablet Take 1 tablet (25 mg total) by mouth 4 (four) times daily as needed. (Patient not taking: Reported on 12/15/2013) 40 tablet 0  . mupirocin ointment (BACTROBAN) 2 % Place 1 application into the nose 2 (two) times daily. (Patient  not taking: Reported on 12/15/2013) 22 g 0  . ondansetron (ZOFRAN) 4 MG tablet Take 1 tablet (4 mg total) by mouth every 8 (eight) hours as needed for nausea or vomiting. (Patient not taking: Reported on 12/15/2013) 12 tablet 0    Home: Home Living Family/patient expects to be discharged to:: Private residence Living Arrangements: Children Type of Home: House (duplex) Home  Access: Stairs to enter Secretary/administratorntrance Stairs-Number of Steps: 2 Entrance Stairs-Rails: Left Home Layout: One level Home Equipment: None  Functional History: Prior Function Level of Independence: Independent Comments: vertigo started 3 weeks ago and had been unable to do housework without stumbling around; states stayed laying down most of the time; mother was helping with children Functional Status:  Mobility: Bed Mobility Overal bed mobility: Needs Assistance Bed Mobility: Supine to Sit Supine to sit: Supervision, Min guard General bed mobility comments: repots [facility] own (advised for safety to call for help) Transfers Overall transfer level: Needs assistance Transfers: Sit to/from Stand Sit to Stand: Min assist General transfer comment: uses UE's on walker or bed and relies on momentum strategy Ambulation/Gait Ambulation/Gait assistance: Mod assist Ambulation Distance (Feet): 7 Feet (and 3') Assistive device: Rolling walker (2 wheeled) Gait Pattern/deviations: Step-through pattern, Trunk flexed, Shuffle, Wide base of support General Gait Details: assist with walker with turns and backing up due to heavy UE reliance    ADL:    Cognition: Cognition Overall Cognitive Status: Within Functional Limits for tasks assessed Orientation Level: Oriented X4 Cognition Arousal/Alertness: Awake/alert Behavior During Therapy: WFL for tasks assessed/performed Overall Cognitive Status: Within Functional Limits for tasks assessed  Blood pressure 104/58, pulse 91, temperature 99.2 F (37.3 C), temperature source Oral, resp. rate 80, height 5\' 2"  (1.575 m), weight 97.2 kg (214 lb 4.6 oz), last menstrual period 12/13/2013, SpO2 100 %. Physical Exam  Constitutional: She is oriented to person, place, and time. She appears well-developed.  HENT:  Head: Normocephalic.  Eyes: EOM are normal.  Neck: Normal range of motion. Neck supple. No thyromegaly present.  Cardiovascular:  Normal rate and regular rhythm.   Respiratory: Effort normal and breath sounds normal. No respiratory distress.  GI: Bowel sounds are normal. She exhibits no distension.  Neurological: She is alert and oriented to person, place, and time.  Skin: Skin is warm and dry.  Motor strength is 5/5 bilateral deltoids, biceps, triceps, grip 4/5 in the hip flexors and knee extensors 3 minus ankle dorsiflexor plantar flexor 2 minus total flexor extensor Sensation reduced to light touch in the left foot Reduced proprioception in both feet Sitting balance is good Deep tendon reflexes 0 bilateral lower extremities No results found for this or any previous visit (from the past 24 hour(s)). Mr Thoracic Spine W Wo Contrast  12/17/2013   CLINICAL DATA:  Bilateral leg weakness. Mid and low back pain. Initial encounter.  EXAM: MRI THORACIC AND LUMBAR SPINE WITHOUT AND WITH CONTRAST  TECHNIQUE: Multiplanar and multiecho pulse sequences of the thoracic and lumbar spine were obtained without and with intravenous contrast.  CONTRAST:  20mL MULTIHANCE GADOBENATE DIMEGLUMINE 529 MG/ML IV SOLN  COMPARISON:  None.  FINDINGS: MR THORACIC SPINE FINDINGS  Segmentation: Counting was performed from the craniocervical junction. Segmentation is anatomic with 7 cervical, 12 thoracic and 5 lumbar type vertebral bodies.  Alignment: Anatomic.  Vertebrae: Normal.  Active red marrow throughout the spine.  Cord: There are small peripheral cord lesions in the lateral aspect of the cord bilaterally throughout the thoracic spine.  The show increased T2 signal and are only well appreciated on the axial images due to partial volume averaging on the sagittal images. When correlating axial and sagittal images, they be can be seen on the inversion recovery images. Most prominent lesion is at the T5-T6 level on the LEFT. None of these lesions show post gadolinium enhancement to suggest an active demyelinating disease these are confined to white matter  tracks without involvement of the gray matter. These are also visualized in the lower cervical cord at C7-T1.  Paraspinal tissues: Normal.  Disc levels:  Normal thoracic intervertebral discs.  No stenosis.  MR LUMBAR SPINE FINDINGS  Alignment:  Anatomic.  Vertebrae:  Normal.  Conus medullaris: Normal at L1-L2.  Paraspinal tissues: Normal.  Disc levels:  Normal.  No disc herniations or degenerative disease.  After contrast administration, there is no abnormal enhancement in the lumbar spine.  IMPRESSION: 1. Multiple bilateral thoracic cord lesions in the lateral white matter, most compatible with prior demyelinating disease. No active demyelinating disease in the thoracic spine. This could be seen with multiple sclerosis, NMO, vasculitis, neurosarcoidosis, or ADEM among other causes. In this age group and with this pattern, multiple sclerosis is most likely. 2. Partial visualization of the lower cervical cord shows white matter lesions an cervical spine MRI with and without contrast is recommended to complete the assessment. An MRI of the brain should also be considered to assess for involvement. 3. Normal MRI of the lumbar spine.   Electronically Signed   By: Andreas Newport M.D.   On: 12/17/2013 16:00   Mr Lumbar Spine W Wo Contrast  12/17/2013   CLINICAL DATA:  Bilateral leg weakness. Mid and low back pain. Initial encounter.  EXAM: MRI THORACIC AND LUMBAR SPINE WITHOUT AND WITH CONTRAST  TECHNIQUE: Multiplanar and multiecho pulse sequences of the thoracic and lumbar spine were obtained without and with intravenous contrast.  CONTRAST:  20mL MULTIHANCE GADOBENATE DIMEGLUMINE 529 MG/ML IV SOLN  COMPARISON:  None.  FINDINGS: MR THORACIC SPINE FINDINGS  Segmentation: Counting was performed from the craniocervical junction. Segmentation is anatomic with 7 cervical, 12 thoracic and 5 lumbar type vertebral bodies.  Alignment: Anatomic.  Vertebrae: Normal.  Active red marrow throughout the spine.  Cord: There are  small peripheral cord lesions in the lateral aspect of the cord bilaterally throughout the thoracic spine. The show increased T2 signal and are only well appreciated on the axial images due to partial volume averaging on the sagittal images. When correlating axial and sagittal images, they be can be seen on the inversion recovery images. Most prominent lesion is at the T5-T6 level on the LEFT. None of these lesions show post gadolinium enhancement to suggest an active demyelinating disease these are confined to white matter tracks without involvement of the gray matter. These are also visualized in the lower cervical cord at C7-T1.  Paraspinal tissues: Normal.  Disc levels:  Normal thoracic intervertebral discs.  No stenosis.  MR LUMBAR SPINE FINDINGS  Alignment:  Anatomic.  Vertebrae:  Normal.  Conus medullaris: Normal at L1-L2.  Paraspinal tissues: Normal.  Disc levels:  Normal.  No disc herniations or degenerative disease.  After contrast administration, there is no abnormal enhancement in the lumbar spine.  IMPRESSION: 1. Multiple bilateral thoracic cord lesions in the lateral white matter, most compatible with prior demyelinating disease. No active demyelinating disease in the thoracic spine. This could be seen with multiple sclerosis, NMO, vasculitis, neurosarcoidosis, or ADEM among other causes. In this age group  and with this pattern, multiple sclerosis is most likely. 2. Partial visualization of the lower cervical cord shows white matter lesions an cervical spine MRI with and without contrast is recommended to complete the assessment. An MRI of the brain should also be considered to assess for involvement. 3. Normal MRI of the lumbar spine.   Electronically Signed   By: Andreas NewportGeoffrey  Lamke M.D.   On: 12/17/2013 16:00    Assessment/Plan: Diagnosis: Paraplegia secondary to spinal cord lesion, workup pending 1. Does the need for close, 24 hr/day medical supervision in concert with the patient's rehab needs  make it unreasonable for this patient to be served in a less intensive setting? Potentially 2. Co-Morbidities requiring supervision/potential complications: B12 deficiency, neurogenic bladder 3. Due to bladder management, bowel management, safety, skin/wound care, disease management, medication administration and patient education, does the patient require 24 hr/day rehab nursing? Potentially 4. Does the patient require coordinated care of a physician, rehab nurse, PT (1-2 hrs/day, 5 days/week) and OT (1-2 hrs/day, 5 days/week) to address physical and functional deficits in the context of the above medical diagnosis(es)? Potentially Addressing deficits in the following areas: balance, endurance, locomotion, strength, transferring, bowel/bladder control, bathing, dressing, feeding, grooming and toileting 5. Can the patient actively participate in an intensive therapy program of at least 3 hrs of therapy per day at least 5 days per week? Potentially 6. The potential for patient to make measurable gains while on inpatient rehab is excellent 7. Anticipated functional outcomes upon discharge from inpatient rehab are modified independent  with PT, modified independent with OT, n/a with SLP. 8. Estimated rehab length of stay to reach the above functional goals is: 7-10 days 9. Does the patient have adequate social supports and living environment to accommodate these discharge functional goals? Potentially 10. Anticipated D/C setting: Home 11. Anticipated post D/C treatments: Outpatient therapy 12. Overall Rehab/Functional Prognosis: excellent  RECOMMENDATIONS: This patient's condition is appropriate for continued rehabilitative care in the following setting: anticipate CIR once workup  is completed and further treatment plan is established Patient has agreed to participate in recommended program. Yes Note that insurance prior authorization may be required for reimbursement for recommended care.  Comment:  MRI of the cervical spine and brain to be performed today at around 5 PM    12/18/2013

## 2013-12-18 NOTE — Evaluation (Signed)
Physical Therapy Evaluation Patient Details Name: Jill Summers MRN: [id number] DOB: October 14, 1985 Today's Date: 12/18/2013   History of Present Illness  Patient is a 28 y/o female admitted with sudden onset of lower extremity weakness and numbness, unable to walk.She has recently had a boils on the inside of her thighs which were lanced, she has also developed cellulitis of the umbilicus.  Found to have pancytopenia.  Undergoing work up for Vit B12 deficiency.  Clinical Impression  Patient presents with decreased independence with mobility due to deficits listed in PT problem list.  She will benefit from skilled PT in the acute setting to allow return home with family assist initially following CIR level rehab stay.  Have discussed plan with patient and she is considering.  May need further PT evaluation for vertigo symptoms.  Main goal today was mobility evaluation.    Follow Up Recommendations CIR;Supervision/Assistance - 24 hour    Equipment Recommendations  Rolling walker with 5" wheels;3in1 (PT)    Recommendations for Other Services Rehab consult     Precautions / Restrictions Precautions Precautions: Fall      Mobility  Bed Mobility Overal bed mobility: Needs Assistance Bed Mobility: Supine to Sit     Supine to sit: Supervision;Min guard     General bed mobility comments: [facility] (advised for safety to call for help)  Transfers Overall transfer level: Needs assistance   Transfers: Sit to/from Stand Sit to Stand: Min assist         General transfer comment: uses UE's on walker or bed and relies on momentum strategy  Ambulation/Gait Ambulation/Gait assistance: Mod assist Ambulation Distance (Feet): 7 Feet (and 3') Assistive device: Rolling walker (2 wheeled) Gait Pattern/deviations: Step-through pattern;Trunk flexed;Shuffle;Wide base of support     General Gait Details: assist with walker with turns and backing up due to heavy  UE reliance  Stairs            Wheelchair Mobility    Modified Rankin (Stroke Patients Only)       Balance Overall balance assessment: Needs assistance   Sitting balance-Leahy Scale: Good       Standing balance-Leahy Scale: Poor Standing balance comment: heavily relies on UE support for standing                             Pertinent Vitals/Pain Pain Assessment: No/denies pain    Home Living Family/patient expects to be discharged to:: Private residence Living Arrangements: Children   Type of Home: House (duplex) Home Access: Stairs to enter Entrance Stairs-Rails: Left Entrance Stairs-Number of Steps: 2 Home Layout: One level Home Equipment: None      Prior Function Level of Independence: Independent         Comments: vertigo started 3 weeks ago and had been unable to do housework without stumbling around; states stayed laying down most of the time; mother was helping with children     Hand Dominance   Dominant Hand: Right    Extremity/Trunk Assessment               Lower Extremity Assessment: RLE deficits/detail;LLE deficits/detail RLE Deficits / Details: AAROM WFL, strength hip flexion 3+/5, knee extension 4-/5, ankle DF 3+/5 LLE Deficits / Details: AAROM WFL, strength hip flexion 3-/5, knee extension 4/5, ankle DF 3+/5     Communication   Communication: No difficulties  Cognition Arousal/Alertness: Awake/alert Behavior During Therapy: WFL for  tasks assessed/performed Overall Cognitive Status: Within Functional Limits for tasks assessed                      General Comments      Exercises Other Exercises Other Exercises: educated to perform Ankle pumps, quad sets and glut sets multiple times during day to help with strength      Assessment/Plan    PT Assessment Patient needs continued PT services  PT Diagnosis Generalized weakness;Abnormality of gait   PT Problem List Decreased strength;Decreased activity  tolerance;Decreased balance;Decreased knowledge of use of DME;Decreased mobility;Decreased knowledge of precautions;Decreased safety awareness;Impaired sensation  PT Treatment Interventions Gait training;DME instruction;Therapeutic exercise;Balance training;Functional mobility training;Therapeutic activities;Patient/family education   PT Goals (Current goals can be found in the Care Plan section) Acute Rehab PT Goals Patient Stated Goal: To return to independent PT Goal Formulation: With patient Time For Goal Achievement: 01/01/14 Potential to Achieve Goals: Good    Frequency Min 4X/week   Barriers to discharge Decreased caregiver support reports has someone who could stay with her initially    Co-evaluation               End of Session Equipment Utilized During Treatment: Gait belt Activity Tolerance: Patient limited by fatigue Patient left: in chair;with call bell/phone within reach;with family/visitor present Nurse Communication: Mobility status         Time: 1105-1130 PT Time Calculation (min) (ACUTE ONLY): 25 min   Charges:   PT Evaluation $Initial PT Evaluation Tier I: 1 Procedure PT Treatments $Gait Training: 8-22 mins   PT G Codes:          WYNN,CYNDI 12/18/2013, 11:40 AM Sheran Lawlessyndi Wynn, PT [phone] 12/18/2013

## 2013-12-18 NOTE — Progress Notes (Signed)
Rehab Admissions Coordinator Note:  Patient was screened by Clois DupesBoyette, Nioka Thorington Godwin for appropriateness for an Inpatient Acute Rehab Consult per PT recommendation.  At this time, we are recommending Inpatient Rehab consult as well as OT eval. I will contact attending MD.   Clois DupesBoyette, Caris Cerveny Godwin 12/18/2013, 11:47 AM  I can be reached at [phone].

## 2013-12-18 NOTE — Progress Notes (Signed)
Patient ID: Jill Summers, female   DOB: 17-Oct-1985, 28 y.o.   MRN: [id number]  [facility] PROGRESS NOTE  Jill ArthurCandace L Oconnor WUJ:[id number] DOB: 17-Oct-1985 DOA: 12/15/2013 PCP: No PCP Per Patient   Brief narrative: The patient is 28 yo female [facility] sudden onset of lower extremity weakness and numbness, unable to walk.She has recently had a boils on the inside of her thighs which were lanced, she has also developed cellulitis of the umbilicus.   In ED, VSS but blood work notable for severe pancytopenia (WBC 1.4, ANC 0.4, Hb 5.5, MCV 101.9, platelets 37K). Labwork from 06/24/2011 showed a WBC of 4.3, Hb 9.3, MCV 105.6 and platelets 116K. Ferritin at that time was 46, folate >20, but B-12 50. Other labwork so far this admission includes an LDH of 4300, T.Bil 3.2, AST 67, ALT 31, normal creatinine, normal PTT, PT/INR 1.20, and a negative antibody screen   Assessment and Plan:    Principal Problem:  Pancytopenia - blood counts improving post transfusion except platelets which are trending down  - etiology is stil fairly unclear and so far thought to be secondary to B12 deficiency  - will hold off on any transfusions today, awaiting for CBC - continue to follow up on hematology recommendations, we appreciate assistance  Active Problems:  Vit B12 deficiency - continue supplementing  LE weakness and numbness - most likely manifestations of peripheral neuropathy as well as subacute combined degeneration - per neurology thoracic cord lesions most likely manifestations of vitamin B 12 deficiency - MRI studies of the brain and cervical spinal cord today to rule out demyelinating disease   Fever with neutropenia  - not clear if there is any source yet and unsure if related to principal problem  - possible UTI based on UA, will follow up on urine culture - continue Maxipime day #3 for now as temp still 99.5 F on Maxipime   DVT prophylaxis  SCD's  Code  Status: Full Family Communication: Pt at bedside Disposition Plan: Home when medically stable  IV Access:    Peripheral IV Procedures and diagnostic studies:    Ct Head Wo Contrast 12/16/2013 Normal  Medical Consultants:    Neurology  Hematology Other Consultants:    Physical therapy  Anti-Infectives:    Maxipime 11/17 -->   Debbora PrestoMAGICK-Melynda Krzywicki, MD  [facility] Pager [phone]  If 7PM-7AM, please contact night-coverage [email or web address] Password [email or web address] 12/18/2013, 1:59 PM   LOS: 3 days   HPI/Subjective: No events overnight.   Objective: Filed Vitals:   12/17/13 2142 12/18/13 0522 12/18/13 0700 12/18/13 1131  BP: 120/73 120/73 100/65 104/58  Pulse: 84 84 88 91  Temp: 98.3 F (36.8 C) 98.4 F (36.9 C) 99.5 F (37.5 C) 99.2 F (37.3 C)  TempSrc: Oral Oral Oral Oral  Resp:  20 20 80  Height:      Weight:      SpO2: 100% 100% 100% 100%   No intake or output data in the 24 hours ending 12/18/13 1359  Exam:   General:  Pt is alert, follows commands appropriately, not in acute distress  Cardiovascular: Regular rate and rhythm, S1/S2, no murmurs, no rubs, no gallops  Respiratory: Clear to auscultation bilaterally, no wheezing, no crackles, no rhonchi  Abdomen: Soft, non tender, non distended, bowel sounds present, no guarding   Data Reviewed: Basic Metabolic Panel:  Recent Labs Lab 12/15/13 2108 12/16/13 0110 12/17/13 0425  NA 140 141 141  K 4.1 3.8 4.0  CL  101 103 106  CO2 24 22 22   GLUCOSE 98 103* 101*  BUN 14 14 7   CREATININE 0.95 0.89 0.79  CALCIUM 9.0 8.6 8.4   Liver Function Tests:  Recent Labs Lab 12/15/13 2108 12/16/13 0110  AST 67* 62*  ALT 31 27  ALKPHOS 25* 24*  BILITOT 3.2* 3.1*  PROT 7.5 6.8  ALBUMIN 4.0 3.6   CBC:  Recent Labs Lab 12/15/13 2108 12/16/13 0110 12/17/13 0425  WBC 1.4* 1.1* 1.5*  NEUTROABS 0.4* 0.3*  --   HGB 5.5* 5.0* 7.8*  HCT 16.5* 14.6* 22.3*  MCV 101.9* 102.1* 96.1  PLT 37* 30* 28*      Recent Results (from the past 240 hour(s))  Blood culture (routine x 2)     Status: None (Preliminary result)   Collection Time: 12/15/13  9:08 PM  Result Value Ref Range Status   Specimen Description BLOOD LEFT ANTECUBITAL  Final   Special Requests BOTTLES DRAWN AEROBIC AND ANAEROBIC 5CC  Final   Culture  Setup Time   Final    12/16/2013 00:55 [facility]formed at [facility]    Culture   Final           BLOOD CULTURE RECEIVED NO GROWTH TO DATE CULTURE WILL BE HELD FOR 5 DAYS BEFORE ISSUING A FINAL NEGATIVE REPORT Performed at [facility]    Report Status PENDING  Incomplete  Blood culture (routine x 2)     Status: None (Preliminary result)   Collection Time: 12/15/13  9:08 PM  Result Value Ref Range Status   Specimen Description BLOOD RIGHT ANTECUBITAL  Final   Special Requests BOTTLES DRAWN AEROBIC AND ANAEROBIC 3CC  Final   Culture  Setup Time   Final    12/16/2013 00:55 Performed at [facility]    Culture   Final           BLOOD CULTURE RECEIVED NO GROWTH TO DATE CULTURE WILL BE HELD FOR 5 DAYS BEFORE ISSUING A FINAL NEGATIVE REPORT Performed at [facility]    Report Status PENDING  Incomplete  MRSA PCR Screening     Status: None   Collection Time: 12/16/13  1:35 AM  Result Value Ref Range Status   MRSA by PCR NEGATIVE NEGATIVE Final    Comment:        The GeneXpert MRSA Assay (FDA approved for NASAL specimens only), is one component of a comprehensive MRSA colonization surveillance program. It is not intended to diagnose MRSA infection nor to guide or monitor treatment for MRSA infections.      Scheduled Meds: . buPROPion  150 mg Oral Daily  . ceFEPime (MAXIPIME) IV  2 g Intravenous Q8H  . feeding supplement (ENSURE COMPLETE)  237 mL Oral TID BM   Continuous Infusions: . 0.9 % sodium chloride with kcl 75 mL/hr at 12/16/13 2328

## 2013-12-18 NOTE — Progress Notes (Signed)
Subjective: Patient had no new complaints. She feels her legs were somewhat stronger. She was able to stand and ambulate with assistance of physical therapy earlier today.  Objective: Current vital signs: BP 104/58 mmHg  Pulse 91  Temp(Src) 99.2 F (37.3 C) (Oral)  Resp 80  Ht 5\' 2"  (1.575 m)  Wt 97.2 kg (214 lb 4.6 oz)  BMI 39.18 kg/m2  SpO2 100%  LMP 12/13/2013  Neurologic Exam: Alert and in no acute distress. Mental status was normal. No facial weakness was noted. Speech was normal. Strength and movement of upper extremities was normal. Lower extremity strength was unchanged from yesterday except for minimally stronger right quadriceps and right tibialis anterior.  MRI showed small lateral peripheral cord signal abnormalities throughout the thoracic spine. Significance is unclear. Possible demyelinating disease was indicated in report.  Medications: I have reviewed the patient's current medications.  Assessment/Plan: 28 year old lady with previously untreated vitamin B 12 deficiency presenting with lower extremity weakness and numbness, most likely manifestations of peripheral neuropathy as well as subacute combined degeneration. Thoracic cord lesions are most likely manifestations of vitamin B 12 deficiency. MRI studies of the brain and cervical spinal cord without and with contrast media will be obtained to rule out indications demyelinating disease.  I anticipate no changes in current management with parenteral vitamin B-12 replacement and continued physical therapy.  I will continue to follow this lady closely with you.  C.R. Roseanne RenoStewart, MD [facility] [phone]  12/18/2013  12:20 PM

## 2013-12-19 ENCOUNTER — Inpatient Hospital Stay (HOSPITAL_COMMUNITY)
Admission: RE | Admit: 2013-12-19 | Discharge: 2013-12-30 | DRG: 641 | Disposition: A | Discharge status: Home / Self Care | Payer: Medicaid Other | Source: Intra-hospital | Attending: Physical Medicine & Rehabilitation | Admitting: Physical Medicine & Rehabilitation

## 2013-12-19 DIAGNOSIS — F329 Major depressive disorder, single episode, unspecified: Secondary | ICD-10-CM | POA: Diagnosis present

## 2013-12-19 DIAGNOSIS — J45909 Unspecified asthma, uncomplicated: Secondary | ICD-10-CM | POA: Diagnosis present

## 2013-12-19 DIAGNOSIS — E539 Vitamin B deficiency, unspecified: Secondary | ICD-10-CM

## 2013-12-19 DIAGNOSIS — Z6838 Body mass index (BMI) 38.0-38.9, adult: Secondary | ICD-10-CM

## 2013-12-19 DIAGNOSIS — G629 Polyneuropathy, unspecified: Secondary | ICD-10-CM | POA: Diagnosis present

## 2013-12-19 DIAGNOSIS — G63 Polyneuropathy in diseases classified elsewhere: Secondary | ICD-10-CM | POA: Diagnosis present

## 2013-12-19 DIAGNOSIS — D61818 Other pancytopenia: Secondary | ICD-10-CM | POA: Diagnosis present

## 2013-12-19 DIAGNOSIS — E663 Overweight: Secondary | ICD-10-CM | POA: Diagnosis present

## 2013-12-19 DIAGNOSIS — E43 Unspecified severe protein-calorie malnutrition: Secondary | ICD-10-CM | POA: Diagnosis present

## 2013-12-19 DIAGNOSIS — Z8669 Personal history of other diseases of the nervous system and sense organs: Secondary | ICD-10-CM | POA: Diagnosis present

## 2013-12-19 DIAGNOSIS — E538 Deficiency of other specified B group vitamins: Secondary | ICD-10-CM | POA: Diagnosis present

## 2013-12-19 DIAGNOSIS — G8220 Paraplegia, unspecified: Secondary | ICD-10-CM | POA: Diagnosis present

## 2013-12-19 DIAGNOSIS — F3340 Major depressive disorder, recurrent, in remission, unspecified: Secondary | ICD-10-CM | POA: Insufficient documentation

## 2013-12-19 LAB — ZINC: Zinc: 62 ug/dL (ref 60–130)

## 2013-12-19 LAB — URINE CULTURE
Colony Count: NO GROWTH
Culture: NO GROWTH

## 2013-12-19 LAB — COPPER, SERUM: Copper: 130 ug/dL (ref 70–175)

## 2013-12-19 LAB — ROCKY MTN SPOTTED FVR AB, IGM-BLOOD: RMSF IgM: 0.14 IV (ref 0.00–0.89)

## 2013-12-19 MED ORDER — CYANOCOBALAMIN 1000 MCG/ML IJ SOLN: 1000.0000 ug | INTRAMUSCULAR | Status: DC

## 2013-12-19 MED ORDER — ONDANSETRON HCL 4 MG/2ML IJ SOLN: 4.0000 mg | Freq: Four times a day (QID) | INTRAMUSCULAR | Status: DC | PRN

## 2013-12-19 MED ORDER — ALBUTEROL SULFATE (2.5 MG/3ML) 0.083% IN NEBU: 2.5000 mg | INHALATION_SOLUTION | Freq: Four times a day (QID) | RESPIRATORY_TRACT | Status: DC | PRN

## 2013-12-19 MED ORDER — ONDANSETRON HCL 4 MG PO TABS
4.0000 mg | ORAL_TABLET | Freq: Four times a day (QID) | ORAL | Status: DC | PRN
  Administered 2013-12-20: 4 mg via ORAL
  Filled 2013-12-19: qty 1

## 2013-12-19 MED ORDER — ALPRAZOLAM 0.25 MG PO TABS: 0.2500 mg | ORAL_TABLET | Freq: Three times a day (TID) | ORAL | Status: DC | PRN

## 2013-12-19 MED ORDER — MAGIC MOUTHWASH W/LIDOCAINE
10.0000 mL | Freq: Four times a day (QID) | ORAL | Status: DC
  Administered 2013-12-19 – 2013-12-30 (×27): 10 mL via ORAL
  Filled 2013-12-19 (×46): qty 10

## 2013-12-19 MED ORDER — SORBITOL 70 % SOLN
30.0000 mL | Freq: Every day | Status: DC | PRN
  Administered 2013-12-22 – 2013-12-28 (×4): 30 mL via ORAL
  Filled 2013-12-19 (×5): qty 30

## 2013-12-19 MED ORDER — CIPROFLOXACIN HCL 500 MG PO TABS
500.0000 mg | ORAL_TABLET | Freq: Two times a day (BID) | ORAL | Status: DC
  Administered 2013-12-19 – 2013-12-21 (×4): 500 mg via ORAL
  Filled 2013-12-19 (×6): qty 1

## 2013-12-19 MED ORDER — ACETAMINOPHEN 325 MG PO TABS
325.0000 mg | ORAL_TABLET | ORAL | Status: DC | PRN
  Administered 2013-12-23: 650 mg via ORAL
  Administered 2013-12-23: 325 mg via ORAL
  Administered 2013-12-25: 650 mg via ORAL
  Filled 2013-12-19 (×3): qty 2

## 2013-12-19 MED ORDER — CIPROFLOXACIN HCL 500 MG PO TABS: 500.0000 mg | ORAL_TABLET | Freq: Two times a day (BID) | ORAL | Status: DC

## 2013-12-19 MED ORDER — BUPROPION HCL ER (XL) 150 MG PO TB24
150.0000 mg | ORAL_TABLET | Freq: Every day | ORAL | Status: DC
  Administered 2013-12-20 – 2013-12-30 (×11): 150 mg via ORAL
  Filled 2013-12-19 (×13): qty 1

## 2013-12-19 MED ORDER — MAGIC MOUTHWASH W/LIDOCAINE
5.0000 mL | Freq: Three times a day (TID) | ORAL | Status: DC
  Administered 2013-12-19 (×2): 5 mL via ORAL
  Filled 2013-12-19 (×3): qty 5

## 2013-12-19 MED ORDER — CYANOCOBALAMIN 1000 MCG/ML IJ SOLN
1000.0000 ug | Freq: Once | INTRAMUSCULAR | Status: AC
  Administered 2013-12-19: 1000 ug via INTRAMUSCULAR
  Filled 2013-12-19: qty 1

## 2013-12-19 MED ORDER — ENSURE COMPLETE PO LIQD: 237.0000 mL | Freq: Three times a day (TID) | ORAL | Status: DC

## 2013-12-19 NOTE — Progress Notes (Signed)
Writer gave report to [facility], "Jill Summers".

## 2013-12-19 NOTE — Discharge Summary (Signed)
Physician Discharge Summary  Jill Summers WUJ:[id number] DOB: 06-10-1985 DOA: 12/15/2013  PCP: No PCP Per Patient  Admit date: 12/15/2013 Discharge date: 12/19/2013  Recommendations for Outpatient Follow-up:  1. Pt will need to follow up with PCP in 2-3 weeks post discharge 2. Please obtain BMP to evaluate electrolytes and kidney function 3. Please also check CBC to evaluate Hg and Hct levels 4. Pt will need to call cancer center to schedule Vit B12 injections monthly 5. Pt also needs to continue taking Cipro 500 mg PO ID for ? UTI 7 more days   Discharge Diagnoses:  Principal Problem:   Pancytopenia Active Problems:   Vertigo   Fever   Lower extremity weakness   Dizziness   Protein-calorie malnutrition, severe   Vitamin B 12 deficiency   Leg weakness, bilateral   Discharge Condition: Stable  Diet recommendation: Heart healthy diet discussed in details    Brief narrative: The patient is 28 yo female [facility] onset of lower extremity weakness and numbness, unable to walk.She has recently had a boils on the inside of her thighs which were lanced, she has also developed cellulitis of the umbilicus.   In ED, VSS but blood work notable for severe pancytopenia (WBC 1.4, ANC 0.4, Hb 5.5, MCV 101.9, platelets 37K). Labwork from 06/24/2011 showed a WBC of 4.3, Hb 9.3, MCV 105.6 and platelets 116K. Ferritin at that time was 46, folate >20, but B-12 50. Other labwork so far this admission includes an LDH of 4300, T.Bil 3.2, AST 67, ALT 31, normal creatinine, normal PTT, PT/INR 1.20, and a negative antibody screen   Assessment and Plan:    Principal Problem:  Pancytopenia - blood counts improving post transfusion except platelets which are trending down  - etiology is stil fairly unclear and so far thought to be secondary to B12 deficiency  - will hold off on any transfusions - continue to follow up on hematology recommendations, we appreciate  assistance  - pt will need to schedule an appointment at the cancer center for Vit B12 injections  Active Problems:  Vit B12 deficiency - continue supplementing as noted above, once monthly   LE weakness and numbness - most likely manifestations of peripheral neuropathy as well as subacute combined degeneration - per neurology thoracic cord lesions most likely manifestations of vitamin B 12 deficiency - MRI studies of the brain and cervical spinal cord also consistent with Vit B12 deficiency   Fever with neutropenia  - not clear if there is any source yet and unsure if related to principal problem  - possible UTI based on UA, will follow up on urine culture - continue Maxipime day #4 and transition to oral Cipro 500 mg BID PO for 7 more days   DVT prophylaxis  SCD's  Code Status: Full Family Communication: Pt at bedside Disposition Plan: IR  IV Access:    Peripheral IV Procedures and diagnostic studies:    Ct Head Wo Contrast 12/16/2013 Normal  Medical Consultants:    Neurology  Hematology Other Consultants:    Physical therapy  Anti-Infectives:    Maxipime 11/17 --> 11/20  Cipro 11/20 --> 7 more days post discharge    Discharge Exam: Filed Vitals:   12/19/13 0552  BP: 129/75  Pulse: 86  Temp: 100 F (37.8 C)  Resp: 18   Filed Vitals:   12/18/13 0700 12/18/13 1131 12/18/13 2231 12/19/13 0552  BP: 100/65 104/58 114/63 129/75  Pulse: 88 91 81 86  Temp: 99.5 F (37.5 C) 99.2 F (37.3 C) 98.5 F (36.9 C) 100 F (37.8 C)  TempSrc: Oral Oral Oral Oral  Resp: 20 80 20 18  Height:      Weight:      SpO2: 100% 100% 100% 100%    General: Pt is alert, follows commands appropriately, not in acute distress Cardiovascular: Regular rate and rhythm, S1/S2 +, no murmurs, no rubs, no gallops Respiratory: Clear to auscultation bilaterally, no wheezing, no crackles, no rhonchi Abdominal: Soft, non tender, non distended, bowel sounds +, no  guarding Extremities: no edema, no cyanosis, pulses palpable bilaterally DP and PT  Discharge Instructions    Medication List    TAKE these medications        albuterol 108 (90 BASE) MCG/ACT inhaler  Commonly known as:  PROVENTIL HFA;VENTOLIN HFA  Inhale 2 puffs into the lungs every 6 (six) hours as needed for wheezing or shortness of breath. For asthma     buPROPion 150 MG 24 hr tablet  Commonly known as:  WELLBUTRIN XL  Take 150 mg by mouth daily.     cetirizine 10 MG tablet  Commonly known as:  ZYRTEC  Take 10 mg by mouth at bedtime.     ciprofloxacin 500 MG tablet  Commonly known as:  CIPRO  Take 1 tablet (500 mg total) by mouth 2 (two) times daily.     cyanocobalamin 1000 MCG/ML injection  Commonly known as:  (VITAMIN B-12)  Inject 1 mL (1,000 mcg total) into the muscle every 30 (thirty) days.     ibuprofen 200 MG tablet  Commonly known as:  ADVIL,MOTRIN  Take 400 mg by mouth every 8 (eight) hours as needed (for back pain.).     meclizine 25 MG tablet  Commonly known as:  ANTIVERT  Take 1 tablet (25 mg total) by mouth 4 (four) times daily as needed.     mupirocin ointment 2 %  Commonly known as:  BACTROBAN  Place 1 application into the nose 2 (two) times daily.     ondansetron 4 MG tablet  Commonly known as:  ZOFRAN  Take 1 tablet (4 mg total) by mouth every 8 (eight) hours as needed for nausea or vomiting.          Medication List    TAKE these medications        albuterol 108 (90 BASE) MCG/ACT inhaler  Commonly known as:  PROVENTIL HFA;VENTOLIN HFA  Inhale 2 puffs into the lungs every 6 (six) hours as needed for wheezing or shortness of breath. For asthma     buPROPion 150 MG 24 hr tablet  Commonly known as:  WELLBUTRIN XL  Take 150 mg by mouth daily.     cetirizine 10 MG tablet  Commonly known as:  ZYRTEC  Take 10 mg by mouth at bedtime.     cyanocobalamin 1000 MCG/ML injection  Commonly known as:  (VITAMIN B-12)  Inject 1 mL (1,000 mcg  total) into the muscle every 30 (thirty) days.     ibuprofen 200 MG tablet  Commonly known as:  ADVIL,MOTRIN  Take 400 mg by mouth every 8 (eight) hours as needed (for back pain.).     meclizine 25 MG tablet  Commonly known as:  ANTIVERT  Take 1 tablet (25 mg total) by mouth 4 (four) times daily as needed.     mupirocin ointment 2 %  Commonly known as:  BACTROBAN  Place 1 application into the nose 2 (two) times daily.  ondansetron 4 MG tablet  Commonly known as:  ZOFRAN  Take 1 tablet (4 mg total) by mouth every 8 (eight) hours as needed for nausea or vomiting.           Follow-up Information    Follow up with [facility].   Why:  call [phone] to schedul an appointmetn        The results of significant diagnostics from this hospitalization (including imaging, microbiology, ancillary and laboratory) are listed below for reference.     Microbiology: Recent Results (from the past 240 hour(s))  Blood culture (routine x 2)     Status: None (Preliminary result)   Collection Time: 12/15/13  9:08 PM  Result Value Ref Range Status   Specimen Description BLOOD LEFT ANTECUBITAL  Final   Special Requests BOTTLES DRAWN AEROBIC AND ANAEROBIC 5CC  Final   Culture  Setup Time   Final    12/16/2013 00:55 Performed at [facility]    Culture   Final           BLOOD CULTURE RECEIVED NO GROWTH TO DATE CULTURE WILL BE HELD FOR 5 DAYS BEFORE ISSUING A FINAL NEGATIVE REPORT Performed at [facility]    Report Status PENDING  Incomplete  Blood culture (routine x 2)     Status: None (Preliminary result)   Collection Time: 12/15/13  9:08 PM  Result Value Ref Range Status   Specimen Description BLOOD RIGHT ANTECUBITAL  Final   Special Requests BOTTLES DRAWN AEROBIC AND ANAEROBIC 3CC  Final   Culture  Setup Time   Final    12/16/2013 00:55 Performed at [facility]    Culture   Final           BLOOD CULTURE RECEIVED NO GROWTH TO DATE CULTURE  WILL BE HELD FOR 5 DAYS BEFORE ISSUING A FINAL NEGATIVE REPORT Performed at [facility]    Report Status PENDING  Incomplete  MRSA PCR Screening     Status: None   Collection Time: 12/16/13  1:35 AM  Result Value Ref Range Status   MRSA by PCR NEGATIVE NEGATIVE Final    Comment:        The GeneXpert MRSA Assay (FDA approved for NASAL specimens only), is one component of a comprehensive MRSA colonization surveillance program. It is not intended to diagnose MRSA infection nor to guide or monitor treatment for MRSA infections.      Labs: Basic Metabolic Panel:  Recent Labs Lab 12/15/13 2108 12/16/13 0110 12/17/13 0425 12/18/13 1522  NA 140 141 141 140  K 4.1 3.8 4.0 3.7  CL 101 103 106 105  CO2 24 22 22 22   GLUCOSE 98 103* 101* 96  BUN 14 14 7 6   CREATININE 0.95 0.89 0.79 0.71  CALCIUM 9.0 8.6 8.4 8.7   Liver Function Tests:  Recent Labs Lab 12/15/13 2108 12/16/13 0110  AST 67* 62*  ALT 31 27  ALKPHOS 25* 24*  BILITOT 3.2* 3.1*  PROT 7.5 6.8  ALBUMIN 4.0 3.6   No results for input(s): LIPASE, AMYLASE in the last 168 hours. No results for input(s): AMMONIA in the last 168 hours. CBC:  Recent Labs Lab 12/15/13 2108 12/16/13 0110 12/17/13 0425 12/18/13 1522  WBC 1.4* 1.1* 1.5* 1.7*  NEUTROABS 0.4* 0.3*  --   --   HGB 5.5* 5.0* 7.8* 7.6*  HCT 16.5* 14.6* 22.3* 22.0*  MCV 101.9* 102.1* 96.1 95.7  PLT 37* 30* 28* 21*  SIGNED: Time coordinating discharge: Over 30 minutes  Debbora Presto, MD  [facility] 12/19/2013, 10:19 AM Pager [phone]  If 7PM-7AM, please contact night-coverage [email or web address] Password [email or web address]

## 2013-12-19 NOTE — Progress Notes (Signed)
ANTIBIOTIC CONSULT NOTE - Follow Up  Pharmacy Consult for Cefepime Indication: Febrile Neutropenia  Allergies  Allergen Reactions  . Peanut-Containing Drug Products Anaphylaxis, Hives and Swelling    *Throat swelling*  . Shellfish Allergy Anaphylaxis, Hives and Swelling    Throat swelling    Patient Measurements: Height: 5\' 2"  (157.5 cm) Weight: 214 lb 4.6 oz (97.2 kg) IBW/kg (Calculated) : 50.1  Vital Signs: Temp: 100 F (37.8 C) (11/20 0552) Temp Source: Oral (11/20 0552) BP: 129/75 mmHg (11/20 0552) Pulse Rate: 86 (11/20 0552) Intake/Output from previous day:   Intake/Output from this shift:    Labs:  Recent Labs  12/17/13 0425 12/18/13 1522  WBC 1.5* 1.7*  HGB 7.8* 7.6*  PLT 28* 21*  CREATININE 0.79 0.71   Estimated Creatinine Clearance: 112.9 mL/min (by C-G formula based on Cr of 0.71). No results for input(s): VANCOTROUGH, VANCOPEAK, VANCORANDOM, GENTTROUGH, GENTPEAK, GENTRANDOM, TOBRATROUGH, TOBRAPEAK, TOBRARND, AMIKACINPEAK, AMIKACINTROU, AMIKACIN in the last 72 hours.   Microbiology: Recent Results (from the past 720 hour(s))  Blood culture (routine x 2)     Status: None (Preliminary result)   Collection Time: 12/15/13  9:08 PM  Result Value Ref Range Status   Specimen Description BLOOD LEFT ANTECUBITAL  Final   Special Requests BOTTLES DRAWN AEROBIC AND ANAEROBIC 5CC  Final   Culture  Setup Time   Final    12/16/2013 00:55 [facility]    Culture   Final           BLOOD CULTURE RECEIVED NO GROWTH TO DATE CULTURE WILL BE HELD FOR 5 DAYS BEFORE ISSUING A FINAL NEGATIVE REPORT Performed at [facility]    Report Status PENDING  Incomplete  Blood culture (routine x 2)     Status: None (Preliminary result)   Collection Time: 12/15/13  9:08 PM  Result Value Ref Range Status   Specimen Description BLOOD RIGHT ANTECUBITAL  Final   Special Requests BOTTLES DRAWN AEROBIC AND ANAEROBIC 3CC  Final   Culture  Setup Time   Final     12/16/2013 00:55 Performed at [facility]    Culture   Final           BLOOD CULTURE RECEIVED NO GROWTH TO DATE CULTURE WILL BE HELD FOR 5 DAYS BEFORE ISSUING A FINAL NEGATIVE REPORT Performed at [facility]    Report Status PENDING  Incomplete  MRSA PCR Screening     Status: None   Collection Time: 12/16/13  1:35 AM  Result Value Ref Range Status   MRSA by PCR NEGATIVE NEGATIVE Final    Comment:        The GeneXpert MRSA Assay (FDA approved for NASAL specimens only), is one component of a comprehensive MRSA colonization surveillance program. It is not intended to diagnose MRSA infection nor to guide or monitor treatment for MRSA infections.     Medical History: Past Medical History  Diagnosis Date  . Asthma   . Obesity   . Adopted   . Depression   . Chlamydia   . Bacterial vaginosis   . Urinary tract infection   . MRSA infection     Medications:  Scheduled:  . buPROPion  150 mg Oral Daily  . ceFEPime (MAXIPIME) IV  2 g Intravenous Q8H  . feeding supplement (ENSURE COMPLETE)  237 mL Oral TID BM   Infusions:  . 0.9 % sodium chloride with kcl 75 mL/hr at 12/16/13 2328   Assessment: [age] yr female with  h/o asthma and depression presents with complaint of dizziness and lower extremity weakness.  Labs show pancytopenia started on cefepime per pharmacy on 11/17.  Oncology consulted - patient with severe vitamin B12 deficiency and possible MS  11/17 >> cefepime >>   Tmax 100   WBC 1.7 on 11/19, slowly improving. No ANC checked since 11/17  SCr stable at 0.89, CrCl >100  Blood cultures 11/17: ngtd  Goal of Therapy:  Eradication of infection  Plan:  1) Continue Cefepime 2gm IV q8h 2) What is plan for length of abx? 5 days if no positive cultures and no signs of infection?   Hessie KnowsJustin M Freeman Borba, PharmD, BCPS Pager [phone] 12/19/2013 9:01 AM

## 2013-12-19 NOTE — H&P (Signed)
Physical Medicine and Rehabilitation Admission H&P    Chief Complaint  Patient presents with  . Dizziness  : HPI: Jill Summers is a 28 y.o. right handed female with history of depression and bronchial asthma as well as recent cellulitis of the umbilicus and treated with Bactrim in September 2015 as well as recent MRSA PCR screen +2 months ago as well as previously untreated B-12 deficiency. Patient lives with children independent prior to admission and her mother has been assisting with care of the children recently. Admitted 12/16/2013 with bouts of dizziness over the past 2 weeks as well as lower strandy weakness and numbness. She denied any falls. No change in bowel or bladder. Cranial CT scan negative. MRI of thoracic lumbar and cervical spine showed multiple bilateral thoracic cord lesions in the lateral white matter most compatible with demyelinating disease of question etiology and workup ongoing as per neurology services and felt cord lesions most likely manifestations of B-12 deficiency. Partial visualization of lower cervical cord shows white matter lesions with full cervical spine films pending. MRI of the brain 12/18/2013 showed less than 10 nonspecific subcentimeter supratentorial white matter T2 hyperintensities in a atypical distribution for classic demyelination. Found to have severe pancytopenia with WBC 1.4, ANC 0.4, hemoglobin 5.5, MCV 101.9, platelets 37,000. Hematology service consulted (Dr. Jana Hakim) She was transfused. Etiology still remains unclear and so far felt to be secondary to B-12 deficiency and currently maintained on supplementation with plan to follow-up with hematology services mostly for B-12 injections. Patient placed on Maxipime changed to Cipro for suspected UTI with urinalysis study showing negative nitrite and 3-6 WBCs and plans to complete a seven-day course of antibiotic. Physical therapy evaluation completed 12/18/2013 with recommendations of physical  medicine rehabilitation consult. Patient was admitted for comprehensive rehabilitation program  ROS Review of Systems  Constitutional: Positive for fever.  Neurological: Positive for dizziness, tingling and weakness.  All other systems reviewed and are negative    Past Medical History  Diagnosis Date  . Asthma   . Obesity   . Adopted   . Depression   . Chlamydia   . Bacterial vaginosis   . Urinary tract infection   . MRSA infection    Past Surgical History  Procedure Laterality Date  . Cesarean section    . Cesarean section with bilateral tubal ligation     Family History  Problem Relation Age of Onset  . Adopted: Yes  . Hypertension Mother   . Diabetes Father    Social History:  reports that she has never smoked. She has never used smokeless tobacco. She reports that she does not drink alcohol or use illicit drugs. Allergies:  Allergies  Allergen Reactions  . Peanut-Containing Drug Products Anaphylaxis, Hives and Swelling    *Throat swelling*  . Shellfish Allergy Anaphylaxis, Hives and Swelling    Throat swelling   Medications Prior to Admission  Medication Sig Dispense Refill  . albuterol (PROVENTIL HFA;VENTOLIN HFA) 108 (90 BASE) MCG/ACT inhaler Inhale 2 puffs into the lungs every 6 (six) hours as needed for wheezing or shortness of breath. For asthma    . cetirizine (ZYRTEC) 10 MG tablet Take 10 mg by mouth at bedtime.     Marland Kitchen buPROPion (WELLBUTRIN XL) 150 MG 24 hr tablet Take 150 mg by mouth daily.     Marland Kitchen ibuprofen (ADVIL,MOTRIN) 200 MG tablet Take 400 mg by mouth every 8 (eight) hours as needed (for back pain.).    Marland Kitchen meclizine (ANTIVERT) 25 MG  tablet Take 1 tablet (25 mg total) by mouth 4 (four) times daily as needed. (Patient not taking: Reported on 12/15/2013) 40 tablet 0  . mupirocin ointment (BACTROBAN) 2 % Place 1 application into the nose 2 (two) times daily. (Patient not taking: Reported on 12/15/2013) 22 g 0  . ondansetron (ZOFRAN) 4 MG tablet Take 1 tablet  (4 mg total) by mouth every 8 (eight) hours as needed for nausea or vomiting. (Patient not taking: Reported on 12/15/2013) 12 tablet 0    Home: Home Living Family/patient expects to be discharged to:: Private residence Living Arrangements: Children Available Help at Discharge:  (pt trying to find someone who can stay with her at d/c) Type of Home: House (duplex) Home Access: Stairs to enter Entrance Stairs-Number of Steps: 2 Entrance Stairs-Rails: Left Home Layout: One level Home Equipment: None   Functional History: Prior Function Level of Independence: Independent Comments: vertigo started 3 weeks ago and had been unable to do housework without stumbling around; states stayed laying down most of the time; mother was helping with children  Functional Status:  Mobility: Bed Mobility Overal bed mobility: Needs Assistance Bed Mobility: Supine to Sit Supine to sit: Supervision, Min guard General bed mobility comments: repots has gotten up to [facility] on her own (advised for safety to call for help) Transfers Overall transfer level: Needs assistance Transfers: Sit to/from Stand Sit to Stand: Min assist General transfer comment: uses UE's on walker or bed and relies on momentum strategy Ambulation/Gait Ambulation/Gait assistance: Mod assist Ambulation Distance (Feet): 7 Feet (and 3') Assistive device: Rolling walker (2 wheeled) Gait Pattern/deviations: Step-through pattern, Trunk flexed, Shuffle, Wide base of support General Gait Details: assist with walker with turns and backing up due to heavy UE reliance    ADL:    Cognition: Cognition Overall Cognitive Status: Within Functional Limits for tasks assessed Orientation Level: Oriented X4 Cognition Arousal/Alertness: Awake/alert Behavior During Therapy: WFL for tasks assessed/performed Overall Cognitive Status: Within Functional Limits for tasks assessed  Physical Exam: Blood pressure 129/75, pulse 86, temperature 100 F  (37.8 C), temperature source Oral, resp. rate 18, height _0  (1.575 m), weight 97.2 kg (214 lb 4.6 oz), last menstrual period 12/13/2013, SpO2 100 %. Physical Exam Constitutional: She is oriented to person, place, and time. She appears well-developed.  HENT: oral mucosa pink and moist, dentition fair Head: Normocephalic.  Eyes: EOM are normal.  Neck: Normal range of motion. Neck supple. No thyromegaly present.  Cardiovascular: Normal rate and regular rhythm. no murmurs Respiratory: Effort normal and breath sounds normal. No respiratory distress.  GI: Bowel sounds are normal. She exhibits no distension. Non tender    Skin: Skin is warm and dry.  Neuro: Alert and oriented. Good insight and awareness, CN exam intact Motor strength is 5/5 bilateral deltoids, biceps, triceps, grip 3/5 in the hip flexors and, 4/5 knee extensors 4/5 ankle dorsiflexor and plantarflexors.  Sensation reduced to light touch in both legs below the mid thighs, left perhaps involved more than right. Reduced proprioception in both legs as well in same distribution. Can sense gross pain. No sensory findings in upper extremities and no sensory level in abdomen. Sitting balance is good Deep tendon reflexes 1+ at patella and absent at ankles bilateral lower extremities Psych: normal affect, behavior. Musc: no edema, no gross pain with ROM/movement.    Results for orders placed or performed during the hospital encounter of 12/15/13 (from the past 48 hour(s))  CBC     Status: Abnormal  Collection Time: 12/18/13  3:22 PM  Result Value Ref Range   WBC 1.7 (L) 4.0 - 10.5 K/uL   RBC 2.30 (L) 3.87 - 5.11 MIL/uL   Hemoglobin 7.6 (L) 12.0 - 15.0 g/dL   HCT 22.0 (L) 36.0 - 46.0 %   MCV 95.7 78.0 - 100.0 fL   MCH 33.0 26.0 - 34.0 pg   MCHC 34.5 30.0 - 36.0 g/dL   RDW 22.4 (H) 11.5 - 15.5 %   Platelets 21 (LL) 150 - 400 K/uL    Comment: CRITICAL VALUE NOTED.  VALUE IS CONSISTENT WITH PREVIOUSLY REPORTED AND CALLED  VALUE. REPEATED TO VERIFY DELTA CHECK NOTED   Basic metabolic panel     Status: None   Collection Time: 12/18/13  3:22 PM  Result Value Ref Range   Sodium 140 137 - 147 mEq/L   Potassium 3.7 3.7 - 5.3 mEq/L   Chloride 105 96 - 112 mEq/L   CO2 22 19 - 32 mEq/L   Glucose, Bld 96 70 - 99 mg/dL   BUN 6 6 - 23 mg/dL   Creatinine, Ser 0.71 0.50 - 1.10 mg/dL   Calcium 8.7 8.4 - 10.5 mg/dL   GFR calc non Af Amer >90 >90 mL/min   GFR calc Af Amer >90 >90 mL/min    Comment: (NOTE) The eGFR has been calculated using the CKD EPI equation. This calculation has not been validated in all clinical situations. eGFR's persistently <90 mL/min signify possible Chronic Kidney Disease.    Anion gap 13 5 - 15   Mr Jeri Cos Wo Contrast  12/18/2013   CLINICAL DATA:  Acute onset lower extremity numbness and weakness, unable to walk. Untreated vitamin B12 deficiency. Anemia.  EXAM: MRI HEAD WITHOUT AND WITH CONTRAST  MRI CERVICAL SPINE WITHOUT AND WITH CONTRAST  TECHNIQUE: Multiplanar, multiecho pulse sequences of the brain and surrounding structures, and cervical spine, to include the craniocervical junction and cervicothoracic junction, were obtained without and with intravenous contrast.  CONTRAST:  41m MULTIHANCE GADOBENATE DIMEGLUMINE 529 MG/ML IV SOLN  COMPARISON:  CT of the head December 16, 2013 and MRI of the thoracic spine December 17, 2013  FINDINGS: MRI HEAD FINDINGS  Mild motion degraded examination.  No reduced diffusion to suggest acute ischemia nor hyperacute demyelination. No susceptibility artifact to suggest hemorrhage. Less than 10 supratentorial sub cm white matter FLAIR T2 hyperintensities, predominately in a subcortical white matter distribution. Scattered perivascular spaces. No mass lesions, mass effect nor abnormal parenchymal enhancement.  No abnormal extra-axial fluid collections. No abnormal a extra-axial enhancement. No extra-axial masses. Normal major intracranial vascular flow voids  seen at the skull base.  Trace paranasal sinus mucosal thickening without air-fluid levels. Ocular globes and orbital contents are unremarkable though not tailored for evaluation. Mastoid air cells appear well-aerated. Concave superior margin of the pituitary could reflect arachnoid cyst or volume loss.  MRI CERVICAL SPINE FINDINGS  Cervical vertebral bodies and posterior elements are intact and aligned with maintenance of cervical lordosis. Intervertebral discs demonstrate normal morphology and signal characteristics. No abnormal bone marrow signal. No abnormal osseous or intradiscal enhancement.  Cervical spinal cord appears normal in morphology. The axial T2 sequences are moderately motion degraded, which limits sensitivity for cord signal abnormality, however on the sagittal T2 there is linear T2 bright signal within the dorsum of the spinal cord from T2 through T3, and towards the LEFT at T5 through T7. No syrinx. No suspicious cord, leptomeningeal or epidural enhancement. Included prevertebral and paraspinal soft tissues  are unremarkable.  Level by level evaluation demonstrates no significant disc bulge, canal stenosis or neural foraminal narrowing.  IMPRESSION: MRI HEAD: Less than 10 nonspecific subcentimeter supratentorial white matter T2 hyperintensities, in a atypical distribution for classic demyelination. No abnormal parenchymal enhancement or parenchymal brain volume loss.  MRI CERVICAL SPINE: Abnormal nonenhancing signal within spinal cord, limited assessment on the axial sequences due to motion, findings compatible with dorsal column pathology which can be seen with vitamin B12 deficiency, viral entities. No myelomalacia.  No nerve compressive changes.   Electronically Signed   By: Elon Alas   On: 12/18/2013 22:42   Mr Cervical Spine W Wo Contrast  12/18/2013   CLINICAL DATA:  Acute onset lower extremity numbness and weakness, unable to walk. Untreated vitamin B12 deficiency. Anemia.   EXAM: MRI HEAD WITHOUT AND WITH CONTRAST  MRI CERVICAL SPINE WITHOUT AND WITH CONTRAST  TECHNIQUE: Multiplanar, multiecho pulse sequences of the brain and surrounding structures, and cervical spine, to include the craniocervical junction and cervicothoracic junction, were obtained without and with intravenous contrast.  CONTRAST:  59m MULTIHANCE GADOBENATE DIMEGLUMINE 529 MG/ML IV SOLN  COMPARISON:  CT of the head December 16, 2013 and MRI of the thoracic spine December 17, 2013  FINDINGS: MRI HEAD FINDINGS  Mild motion degraded examination.  No reduced diffusion to suggest acute ischemia nor hyperacute demyelination. No susceptibility artifact to suggest hemorrhage. Less than 10 supratentorial sub cm white matter FLAIR T2 hyperintensities, predominately in a subcortical white matter distribution. Scattered perivascular spaces. No mass lesions, mass effect nor abnormal parenchymal enhancement.  No abnormal extra-axial fluid collections. No abnormal a extra-axial enhancement. No extra-axial masses. Normal major intracranial vascular flow voids seen at the skull base.  Trace paranasal sinus mucosal thickening without air-fluid levels. Ocular globes and orbital contents are unremarkable though not tailored for evaluation. Mastoid air cells appear well-aerated. Concave superior margin of the pituitary could reflect arachnoid cyst or volume loss.  MRI CERVICAL SPINE FINDINGS  Cervical vertebral bodies and posterior elements are intact and aligned with maintenance of cervical lordosis. Intervertebral discs demonstrate normal morphology and signal characteristics. No abnormal bone marrow signal. No abnormal osseous or intradiscal enhancement.  Cervical spinal cord appears normal in morphology. The axial T2 sequences are moderately motion degraded, which limits sensitivity for cord signal abnormality, however on the sagittal T2 there is linear T2 bright signal within the dorsum of the spinal cord from T2 through T3, and  towards the LEFT at T5 through T7. No syrinx. No suspicious cord, leptomeningeal or epidural enhancement. Included prevertebral and paraspinal soft tissues are unremarkable.  Level by level evaluation demonstrates no significant disc bulge, canal stenosis or neural foraminal narrowing.  IMPRESSION: MRI HEAD: Less than 10 nonspecific subcentimeter supratentorial white matter T2 hyperintensities, in a atypical distribution for classic demyelination. No abnormal parenchymal enhancement or parenchymal brain volume loss.  MRI CERVICAL SPINE: Abnormal nonenhancing signal within spinal cord, limited assessment on the axial sequences due to motion, findings compatible with dorsal column pathology which can be seen with vitamin B12 deficiency, viral entities. No myelomalacia.  No nerve compressive changes.   Electronically Signed   By: CElon Alas  On: 12/18/2013 22:42   Mr Thoracic Spine W Wo Contrast  12/17/2013   CLINICAL DATA:  Bilateral leg weakness. Mid and low back pain. Initial encounter.  EXAM: MRI THORACIC AND LUMBAR SPINE WITHOUT AND WITH CONTRAST  TECHNIQUE: Multiplanar and multiecho pulse sequences of the thoracic and lumbar spine were obtained without and  with intravenous contrast.  CONTRAST:  56m MULTIHANCE GADOBENATE DIMEGLUMINE 529 MG/ML IV SOLN  COMPARISON:  None.  FINDINGS: MR THORACIC SPINE FINDINGS  Segmentation: Counting was performed from the craniocervical junction. Segmentation is anatomic with 7 cervical, 12 thoracic and 5 lumbar type vertebral bodies.  Alignment: Anatomic.  Vertebrae: Normal.  Active red marrow throughout the spine.  Cord: There are small peripheral cord lesions in the lateral aspect of the cord bilaterally throughout the thoracic spine. The show increased T2 signal and are only well appreciated on the axial images due to partial volume averaging on the sagittal images. When correlating axial and sagittal images, they be can be seen on the inversion recovery images.  Most prominent lesion is at the T5-T6 level on the LEFT. None of these lesions show post gadolinium enhancement to suggest an active demyelinating disease these are confined to white matter tracks without involvement of the gray matter. These are also visualized in the lower cervical cord at C7-T1.  Paraspinal tissues: Normal.  Disc levels:  Normal thoracic intervertebral discs.  No stenosis.  MR LUMBAR SPINE FINDINGS  Alignment:  Anatomic.  Vertebrae:  Normal.  Conus medullaris: Normal at L1-L2.  Paraspinal tissues: Normal.  Disc levels:  Normal.  No disc herniations or degenerative disease.  After contrast administration, there is no abnormal enhancement in the lumbar spine.  IMPRESSION: 1. Multiple bilateral thoracic cord lesions in the lateral white matter, most compatible with prior demyelinating disease. No active demyelinating disease in the thoracic spine. This could be seen with multiple sclerosis, NMO, vasculitis, neurosarcoidosis, or ADEM among other causes. In this age group and with this pattern, multiple sclerosis is most likely. 2. Partial visualization of the lower cervical cord shows white matter lesions an cervical spine MRI with and without contrast is recommended to complete the assessment. An MRI of the brain should also be considered to assess for involvement. 3. Normal MRI of the lumbar spine.   Electronically Signed   By: GDereck LigasM.D.   On: 12/17/2013 16:00   Mr Lumbar Spine W Wo Contrast  12/17/2013   CLINICAL DATA:  Bilateral leg weakness. Mid and low back pain. Initial encounter.  EXAM: MRI THORACIC AND LUMBAR SPINE WITHOUT AND WITH CONTRAST  TECHNIQUE: Multiplanar and multiecho pulse sequences of the thoracic and lumbar spine were obtained without and with intravenous contrast.  CONTRAST:  22mMULTIHANCE GADOBENATE DIMEGLUMINE 529 MG/ML IV SOLN  COMPARISON:  None.  FINDINGS: MR THORACIC SPINE FINDINGS  Segmentation: Counting was performed from the craniocervical junction.  Segmentation is anatomic with 7 cervical, 12 thoracic and 5 lumbar type vertebral bodies.  Alignment: Anatomic.  Vertebrae: Normal.  Active red marrow throughout the spine.  Cord: There are small peripheral cord lesions in the lateral aspect of the cord bilaterally throughout the thoracic spine. The show increased T2 signal and are only well appreciated on the axial images due to partial volume averaging on the sagittal images. When correlating axial and sagittal images, they be can be seen on the inversion recovery images. Most prominent lesion is at the T5-T6 level on the LEFT. None of these lesions show post gadolinium enhancement to suggest an active demyelinating disease these are confined to white matter tracks without involvement of the gray matter. These are also visualized in the lower cervical cord at C7-T1.  Paraspinal tissues: Normal.  Disc levels:  Normal thoracic intervertebral discs.  No stenosis.  MR LUMBAR SPINE FINDINGS  Alignment:  Anatomic.  Vertebrae:  Normal.  Conus medullaris: Normal at L1-L2.  Paraspinal tissues: Normal.  Disc levels:  Normal.  No disc herniations or degenerative disease.  After contrast administration, there is no abnormal enhancement in the lumbar spine.  IMPRESSION: 1. Multiple bilateral thoracic cord lesions in the lateral white matter, most compatible with prior demyelinating disease. No active demyelinating disease in the thoracic spine. This could be seen with multiple sclerosis, NMO, vasculitis, neurosarcoidosis, or ADEM among other causes. In this age group and with this pattern, multiple sclerosis is most likely. 2. Partial visualization of the lower cervical cord shows white matter lesions an cervical spine MRI with and without contrast is recommended to complete the assessment. An MRI of the brain should also be considered to assess for involvement. 3. Normal MRI of the lumbar spine.   Electronically Signed   By: Dereck Ligas M.D.   On: 12/17/2013 16:00        Medical Problem List and Plan: 1. Functional deficits secondary to severe B12 deficiency with peripheral polyneuropathy. Pt generally debilitated as well 2.  DVT Prophylaxis/Anticoagulation: SCDs. Monitor for any signs of DVT 3. Pain Management: Tylenol as needed 4. Mood/depression: Wellbutrin 150 mg daily, Xanax as needed. Provide emotional support 5. Neuropsych: This patient is capable of making decisions on her own behalf. 6. Skin/Wound Care: Routine skin checks 7. Fluids/Electrolytes/Nutrition: Strict I and os. Follow-up chemistries. Provide nutritional supplements as needed 8. Probable UTI. Urine cultures pending 12/18/2013. Continue Cipro and await cultures 9. History of bronchial asthma. Albuterol nebulizer as needed. 10. Pancytopenia. Follow-up labs and monitor closely for any clinical sequelae    Post Admission Physician Evaluation: 1. Functional deficits secondary  to severe B12 deficiency polyneuropathy with superimposed debilitation due to poor po intake and multiple medical. 2. Patient is admitted to receive collaborative, interdisciplinary care between the physiatrist, rehab nursing staff, and therapy team. 3. Patient's level of medical complexity and substantial therapy needs in context of that medical necessity cannot be provided at a lesser intensity of care such as a SNF. 4. Patient has experienced substantial functional loss from his/her baseline which was documented above under the "Functional History" and "Functional Status" headings.  Judging by the patient's diagnosis, physical exam, and functional history, the patient has potential for functional progress which will result in measurable gains while on inpatient rehab.  These gains will be of substantial and practical use upon discharge  in facilitating mobility and self-care at the household level. 5. Physiatrist will provide 24 hour management of medical needs as well as oversight of the therapy plan/treatment  and provide guidance as appropriate regarding the interaction of the two. 6. 24 hour rehab nursing will assist with bladder management, bowel management, safety, skin/wound care, disease management, medication administration, pain management and patient education  and help integrate therapy concepts, techniques,education, etc. 7. PT will assess and treat for/with: Lower extremity strength, range of motion, stamina, balance, functional mobility, safety, adaptive techniques and equipment, NMR, orthotics, activity tolerance, ego support, family education, community reintegration.   Goals are: mod I with basic mobility and transfers. 8. OT will assess and treat for/with: ADL's, functional mobility, safety, upper extremity strength, adaptive techniques and equipment, NMR, leisure awareness, family and patient education, activity tolerance.   Goals are: mod I. Therapy may proceed with showering this patient. 9. SLP will assess and treat for/with: n/a.  Goals are: n/a. 10. Case Management and Social Worker will assess and treat for psychological issues and discharge planning. 11. Team conference will be held weekly to  assess progress toward goals and to determine barriers to discharge. 12. Patient will receive at least 3 hours of therapy per day at least 5 days per week. 13. ELOS: 12-16 days       14. Prognosis:  excellent     Meredith Staggers, MD, [facility] 12/19/2013   12/19/2013

## 2013-12-19 NOTE — Progress Notes (Signed)
[facility] Patient oriented to Rehab safety plan, fall prevention plan, rehab process, call bell system, and rehab schedule with verbal understanding. Patient resting comfortably in bed with no complaints of pain. Will continue to monitor.

## 2013-12-19 NOTE — PMR Pre-admission (Signed)
PMR Admission Coordinator Pre-Admission Assessment  Patient: Jill Summers is an 28 y.o., female MRN: [id number] DOB: 09/20/1985 Height: 5\' 2"  (157.5 cm) Weight: 97.2 kg (214 lb 4.6 oz)              Insurance Information HMO:  No   PPO:       PCP:       IPA:       80/20:       OTHER:   PRIMARY: Medicaid [place] access      Policy#: [id number]      Subscriber: [name] CM Name:        Phone#:       Fax#:   Pre-Cert#:        Employer: Not employed, disabled from asthma Benefits:  Phone #: [phone]     Name: Automated Eff. Date: Eligible 12/19/13     Deduct:        Out of Pocket Max:        Life Max:   CIR:        SNF:   Outpatient:       Co-Pay:   Home Health:        Co-Pay:   DME:       Co-Pay:   Providers:    Emergency Contact Information Contact Information    Name Relation Home Work [place] Mother [phone]  [phone]   [place], [place]   [phone]     Current Medical History  Patient Admitting Diagnosis: Vit B12 deficiency, Paraplegia secondary to spinal cord lesion   History of Present Illness: A 28 y.o. right handed female with history of depression and bronchial asthma as well as recent cellulitis of the umbilicus and treated with Bactrim in September 2015 as well as recent MRSA PCR screen +2 months ago as well as previously untreated B-12 deficiency. Patient lives with children independent prior to admission and her mother has been assisting with care of the children recently. Admitted 12/16/2013 with bouts of dizziness over the past 2 weeks as well as lower strandy weakness and numbness. She denied any falls. No change in bowel or bladder. Cranial CT scan negative. MRI of thoracic lumbar and cervical spine showed multiple bilateral thoracic cord lesions in the lateral white matter most compatible with demyelinating disease of question etiology and workup ongoing as per neurology services and felt cord lesions most likely  manifestations of B-12 deficiency. Partial visualization of lower cervical cord shows white matter lesions with full cervical spine films pending. MRI of the brain 12/18/2013 showed less than 10 nonspecific subcentimeter supratentorial white matter T2 hyperintensities in a atypical distribution for classic demyelination. Found to have severe pancytopenia with WBC 1.4, ANC 0.4, hemoglobin 5.5, MCV 101.9, platelets 37,000. Hematology service consulted (Dr. Darnelle Catalan) She was transfused. Etiology still remains unclear and so far felt to be secondary to B-12 deficiency and currently maintained on supplementation with plan to follow-up with hematology services mostly for B-12 injections. Patient placed on Maxipime changed to Cipro for suspected UTI with urinalysis study showing negative nitrite and 3-6 WBCs and plans to complete a seven-day course of antibiotic. Physical therapy evaluation completed 12/18/2013 with recommendations of physical medicine rehabilitation consult. Patient to be admitted for comprehensive inpatient rehabilitation program.    Past Medical History  Past Medical History  Diagnosis Date  . Asthma   . Obesity   . Adopted   . Depression   . Chlamydia   . Bacterial vaginosis   .  Urinary tract infection   . MRSA infection     Family History  family history includes Diabetes in her father; Hypertension in her mother. She was adopted.  Prior Rehab/Hospitalizations:  None   Current Medications  Current facility-administered medications: acetaminophen (TYLENOL) tablet 650 mg, 650 mg, Oral, Q6H PRN, 650 mg at 12/19/13 0601 **OR** acetaminophen (TYLENOL) suppository 650 mg, 650 mg, Rectal, Q6H PRN, Eduard ClosArshad N Kakrakandy, MD;  albuterol (PROVENTIL) (2.5 MG/3ML) 0.083% nebulizer solution 2.5 mg, 2.5 mg, Inhalation, Q6H PRN, Eduard ClosArshad N Kakrakandy, MD buPROPion (WELLBUTRIN XL) 24 hr tablet 150 mg, 150 mg, Oral, Daily, Eduard ClosArshad N Kakrakandy, MD, 150 mg at 12/19/13 1036;  ceFEPIme (MAXIPIME) 2 g in  dextrose 5 % 50 mL IVPB, 2 g, Intravenous, Q8H, Leann Trefz Poindexter, RPH, 2 g at 12/19/13 1038;  cyanocobalamin ((VITAMIN B-12)) injection 1,000 mcg, 1,000 mcg, Intramuscular, Once, Lowella DellGustav C Magrinat, MD feeding supplement (ENSURE COMPLETE) (ENSURE COMPLETE) liquid 237 mL, 237 mL, Oral, TID BM, Tilda FrancoLindsey Baker, RD, 237 mL at 12/16/13 1600;  LORazepam (ATIVAN) injection 1 mg, 1 mg, Intravenous, BID PRN, Dorothea OgleIskra M Myers, MD, 1 mg at 12/18/13 2006;  magic mouthwash w/lidocaine, 5 mL, Oral, TID, Dorothea OgleIskra M Myers, MD ondansetron [facility](ZOFRAN) tablet 4 mg, 4 mg, Oral, Q6H PRN **OR** ondansetron (ZOFRAN) injection 4 mg, 4 mg, Intravenous, Q6H PRN, Eduard ClosArshad N Kakrakandy, MD;  sodium chloride 0.9 % 1,000 mL with potassium chloride 10 mEq infusion, , Intravenous, Continuous, Lowella DellGustav C Magrinat, MD, Last Rate: 75 mL/hr at 12/16/13 2328  Patients Current Diet: Diet regular Diet - low sodium heart healthy  Precautions / Restrictions Precautions Precautions: Fall Restrictions Weight Bearing Restrictions: No   Prior Activity Level Community (5-7x/wk): Went out daily, was not working.   Home Assistive Devices / Equipment Home Assistive Devices/Equipment: Eyeglasses Home Equipment: None  Prior Functional Level Prior Function Level of Independence: Independent Comments: has 2 children, ages 663 and 8  Current Functional Level Cognition  Overall Cognitive Status: Within Functional Limits for tasks assessed Orientation Level: Oriented X4    Extremity Assessment (includes Sensation/Coordination)  Lower Extremity Assessment: RLE deficits/detail;LLE deficits/detail RLE Deficits / Details: AAROM WFL, strength hip flexion 3+/5, knee extension 4-/5, ankle DF 3+/5 LLE Deficits / Details: AAROM WFL, strength hip flexion 3-/5, knee extension 4/5, ankle DF 3+/5    ADLs  Overall ADL's : Needs assistance/impaired Eating/Feeding: Independent, Sitting Grooming: Wash/dry hands, Wash/dry face, Set up, Sitting Upper Body  Bathing: Set up, Sitting Lower Body Bathing: Moderate assistance, Sit to/from stand Upper Body Dressing : Set up, Sitting Lower Body Dressing: Moderate assistance, Sit to/from stand Toilet Transfer: Minimal assistance, Stand-pivot, BSC, RW Toileting- Clothing Manipulation and Hygiene: Minimal assistance, Sit to/from stand Functional mobility during ADLs: Minimal assistance, Rolling walker General ADL Comments: Patient agreeable to OT evaluation. She is still experiencing RLE weakness which is impacting ADL performance. She is able to perform UB simulated ADLs with setup seated EOB, and demonstrated ability to don/doff both socks from seated position with increased time. She does having difficulty with standing ADLs, requiring mod A for balance when trying to perform. She has been using BSC for toileting and requires min A overall for this task.    Mobility  Overal bed mobility: Needs Assistance Bed Mobility: Supine to Sit Supine to sit: Supervision General bed mobility comments: repots has [facility] own (advised for safety to call for help)    Transfers  Overall transfer level: Needs assistance Equipment used: Rolling walker (2 wheeled) Transfers:  Sit to/from Stand, Pharmacologisttand Pivot Transfers Sit to Stand: Min assist Stand pivot transfers: Min assist General transfer comment: uses UE's on walker or bed and relies on momentum strategy    Ambulation / Gait / Stairs / Wheelchair Mobility  Ambulation/Gait Ambulation/Gait assistance: Mod assist Ambulation Distance (Feet): 7 Feet (and 3') Assistive device: Rolling walker (2 wheeled) Gait Pattern/deviations: Step-through pattern, Trunk flexed, Shuffle, Wide base of support General Gait Details: assist with walker with turns and backing up due to heavy UE reliance    Posture / Balance Overall balance assessment: Needs assistance Sitting balance-Leahy Scale: Good Standing balance-Leahy Scale: Poor Standing balance comment: heavily  relies on UE support for standing   Special needs/care consideration BiPAP/CPAP No CPM No Continuous Drip IV 0.9% NS with KCL 10 meq/L at 75 ml/hr Dialysis No       Life Vest No Oxygen No Special Bed No Trach Size No Wound Vac (area) No    Skin Had a boil on thigh, but it is healed now.                              Bowel mgmt: Last BM 12/16/13 Bladder mgmt: [facility] 2 incontinent episodes per patient Diabetic mgmt No, but patient feels like she should be tested.    Previous Home Environment Living Arrangements: Children Available Help at Discharge:  (pt trying to find someone who can stay with her at d/c) Type of Home: House (duplex) Home Layout: One level Home Access: Stairs to enter Entrance Stairs-Rails: Left Entrance Stairs-Number of Steps: 2 Bathroom Shower/Tub: Tub/shower unit, Buyer, retailCurtain Bathroom Toilet: Standard Bathroom Accessibility: Yes How Accessible: Accessible via walker Home Care Services: No  Discharge Living Setting Plans for Discharge Living Setting: Patient's home, House, Lives with (comment) (Lives with 3 yo son in a duplex.) Type of Home at Discharge: House (Duplex.) Discharge Home Layout: One level Discharge Home Access: Stairs to enter Entrance Stairs-Number of Steps: 1-2 small steps entry Does the patient have any problems obtaining your medications?: No  Social/Family/Support Systems Patient Roles: Parent (Has mom, a sister and a 753 yo son.) Contact Information: Rosalie GumsMarilyn Woessner - mom [phone] Anticipated Caregiver: self, mom and a friend Ability/Limitations of Caregiver: Mom is retired and lives next door.  Mom helps out with patient's son.  Sister comes home on Wednesdays.  She can call on her friend Ciera as well. Caregiver Availability: Other (Comment) (Mom can assist as needed.) Discharge Plan Discussed with Primary Caregiver: Yes Is Caregiver In Agreement with Plan?: Yes Does Caregiver/Family have Issues with  Lodging/Transportation while Pt is in Rehab?: No  Goals/Additional Needs Patient/Family Goal for Rehab: PT/OT mod I goals Expected length of stay: 7-10 days Cultural Considerations: None Dietary Needs: Regular diet, thin liquids Equipment Needs: TBD Pt/Family Agrees to Admission and willing to participate: Yes Program Orientation Provided & Reviewed with Pt/Caregiver Including Roles  & Responsibilities: Yes  Decrease burden of Care through IP rehab admission: N/A  Possible need for SNF placement upon discharge: Not anticipated  Patient Condition: This patient's condition remains as documented in the consult dated 12/18/13, in which the Rehabilitation Physician determined and documented that the patient's condition is appropriate for intensive rehabilitative care in an inpatient rehabilitation facility. Will admit to inpatient rehab today.  Preadmission Screen Completed By:  Trish MageLogue, Phylis Javed M, 12/19/2013 11:09 AM ______________________________________________________________________   Discussed status with Dr. Riley KillSwartz on 12/19/13 at 1109 and received telephone approval for  admission today.  Admission Coordinator:  Trish Mage, time1109/Date11/20/15

## 2013-12-19 NOTE — Progress Notes (Signed)
Occupational Therapy Evaluation Patient Details Name: Jill Summers MRN: [id number] DOB: [birth year] Today's Date: 12/19/2013    History of Present Illness Patient is a 28 y/o female admitted with sudden onset of lower extremity weakness and numbness, unable to walk.She has recently had a boils on the inside of her thighs which were lanced, she has also developed cellulitis of the umbilicus.  Found to have pancytopenia.  Undergoing work up for Vit B12 deficiency.   Clinical Impression   Patient presents to OT with decreased ADL independence and safety. Will benefit from skilled OT to maximize function and to facilitate a safe discharge.    Follow Up Recommendations  CIR;Supervision/Assistance - 24 hour    Equipment Recommendations  Other (comment) (tbd at next venue of care)    Recommendations for Other Services       Precautions / Restrictions Precautions Precautions: Fall Restrictions Weight Bearing Restrictions: No      Mobility Bed Mobility Overal bed mobility: Needs Assistance Bed Mobility: Supine to Sit     Supine to sit: Supervision        Transfers Overall transfer level: Needs assistance Equipment used: Rolling walker (2 wheeled) Transfers: Sit to/from UGI CorporationStand;Stand Pivot Transfers Sit to Stand: Min assist Stand pivot transfers: Min assist       General transfer comment: uses UE's on walker or bed and relies on momentum strategy    Balance                                            ADL Overall ADL's : Needs assistance/impaired Eating/Feeding: Independent;Sitting   Grooming: Wash/dry hands;Wash/dry face;Set up;Sitting   Upper Body Bathing: Set up;Sitting   Lower Body Bathing: Moderate assistance;Sit to/from stand   Upper Body Dressing : Set up;Sitting   Lower Body Dressing: Moderate assistance;Sit to/from stand   Toilet Transfer: Minimal assistance;Stand-pivot;BSC;RW   Toileting- Clothing Manipulation and Hygiene:  Minimal assistance;Sit to/from stand       Functional mobility during ADLs: Minimal assistance;Rolling walker General ADL Comments: Patient agreeable to OT evaluation. She is still experiencing RLE weakness which is impacting ADL performance. She is able to perform UB simulated ADLs with setup seated EOB, and demonstrated ability to don/doff both socks from seated position with increased time. She does having difficulty with standing ADLs, requiring mod A for balance when trying to perform. She has been using BSC for toileting and requires min A overall for this task.     Vision                     Perception     Praxis      Pertinent Vitals/Pain Pain Assessment: No/denies pain     Hand Dominance Right   Extremity/Trunk Assessment Upper Extremity Assessment Upper Extremity Assessment: Overall WFL for tasks assessed   Lower Extremity Assessment Lower Extremity Assessment: Defer to PT evaluation       Communication Communication Communication: No difficulties   Cognition Arousal/Alertness: Awake/alert Behavior During Therapy: WFL for tasks assessed/performed Overall Cognitive Status: Within Functional Limits for tasks assessed                     General Comments       Exercises       Shoulder Instructions      Home Living Family/patient expects to be discharged to:: Private residence Living Arrangements:  Children Available Help at Discharge:  (pt trying to find someone who can stay with her at d/c) Type of Home: House (duplex) Home Access: Stairs to enter Entrance Stairs-Number of Steps: 2 Entrance Stairs-Rails: Left Home Layout: One level     Bathroom Shower/Tub: IT trainerTub/shower unit;Curtain   Bathroom Toilet: Standard Bathroom Accessibility: Yes How Accessible: Accessible via walker Home Equipment: None          Prior Functioning/Environment Level of Independence: Independent        Comments: has 2 children, ages 273 and 8    OT  Diagnosis: Generalized weakness   OT Problem List: Decreased strength;Impaired balance (sitting and/or standing);Decreased knowledge of use of DME or AE   OT Treatment/Interventions: Self-care/ADL training;DME and/or AE instruction;Therapeutic activities;Therapeutic exercise;Patient/family education    OT Goals(Current goals can be found in the care plan section) Acute Rehab OT Goals Patient Stated Goal: To return to independent OT Goal Formulation: With patient Time For Goal Achievement: 01/02/14 Potential to Achieve Goals: Good  OT Frequency: Min 3X/week   Barriers to D/C: Decreased caregiver support  pt looking for someone who can stay with her after d/c home       Co-evaluation              End of Session Equipment Utilized During Treatment: Rolling walker Nurse Communication: Other (comment) (OT worked with patient)  Activity Tolerance: Patient tolerated treatment well Patient left: in bed;with call bell/phone within reach   Time: 0953-1011 OT Time Calculation (min): 18 min Charges:  OT General Charges $OT Visit: 1 Procedure OT Evaluation $Initial OT Evaluation Tier I: 1 Procedure OT Treatments $Self Care/Home Management : 8-22 mins G-Codes:    Jaxsun Ciampi A 12/19/2013, 10:54 AM

## 2013-12-19 NOTE — H&P (View-Only) (Signed)
Physical Medicine and Rehabilitation Admission H&P    Chief Complaint  Patient presents with  . Dizziness  : HPI: Jill Summers is a 28 y.o. right handed female with history of depression and bronchial asthma as well as recent cellulitis of the umbilicus and treated with Bactrim in September 2015 as well as recent MRSA PCR screen +2 months ago as well as previously untreated B-12 deficiency. Patient lives with children independent prior to admission and her mother has been assisting with care of the children recently. Admitted 12/16/2013 with bouts of dizziness over the past 2 weeks as well as lower strandy weakness and numbness. She denied any falls. No change in bowel or bladder. Cranial CT scan negative. MRI of thoracic lumbar and cervical spine showed multiple bilateral thoracic cord lesions in the lateral white matter most compatible with demyelinating disease of question etiology and workup ongoing as per neurology services and felt cord lesions most likely manifestations of B-12 deficiency. Partial visualization of lower cervical cord shows white matter lesions with full cervical spine films pending. MRI of the brain 12/18/2013 showed less than 10 nonspecific subcentimeter supratentorial white matter T2 hyperintensities in a atypical distribution for classic demyelination. Found to have severe pancytopenia with WBC 1.4, ANC 0.4, hemoglobin 5.5, MCV 101.9, platelets 37,000. Hematology service consulted (Dr. Jana Hakim) She was transfused. Etiology still remains unclear and so far felt to be secondary to B-12 deficiency and currently maintained on supplementation with plan to follow-up with hematology services mostly for B-12 injections. Patient placed on Maxipime changed to Cipro for suspected UTI with urinalysis study showing negative nitrite and 3-6 WBCs and plans to complete a seven-day course of antibiotic. Physical therapy evaluation completed 12/18/2013 with recommendations of physical  medicine rehabilitation consult. Patient was admitted for comprehensive rehabilitation program  ROS Review of Systems  Constitutional: Positive for fever.  Neurological: Positive for dizziness, tingling and weakness.  All other systems reviewed and are negative    Past Medical History  Diagnosis Date  . Asthma   . Obesity   . Adopted   . Depression   . Chlamydia   . Bacterial vaginosis   . Urinary tract infection   . MRSA infection    Past Surgical History  Procedure Laterality Date  . Cesarean section    . Cesarean section with bilateral tubal ligation     Family History  Problem Relation Age of Onset  . Adopted: Yes  . Hypertension Mother   . Diabetes Father    Social History:  reports that she has never smoked. She has never used smokeless tobacco. She reports that she does not drink alcohol or use illicit drugs. Allergies:  Allergies  Allergen Reactions  . Peanut-Containing Drug Products Anaphylaxis, Hives and Swelling    *Throat swelling*  . Shellfish Allergy Anaphylaxis, Hives and Swelling    Throat swelling   Medications Prior to Admission  Medication Sig Dispense Refill  . albuterol (PROVENTIL HFA;VENTOLIN HFA) 108 (90 BASE) MCG/ACT inhaler Inhale 2 puffs into the lungs every 6 (six) hours as needed for wheezing or shortness of breath. For asthma    . cetirizine (ZYRTEC) 10 MG tablet Take 10 mg by mouth at bedtime.     Marland Kitchen buPROPion (WELLBUTRIN XL) 150 MG 24 hr tablet Take 150 mg by mouth daily.     Marland Kitchen ibuprofen (ADVIL,MOTRIN) 200 MG tablet Take 400 mg by mouth every 8 (eight) hours as needed (for back pain.).    Marland Kitchen meclizine (ANTIVERT) 25 MG  tablet Take 1 tablet (25 mg total) by mouth 4 (four) times daily as needed. (Patient not taking: Reported on 12/15/2013) 40 tablet 0  . mupirocin ointment (BACTROBAN) 2 % Place 1 application into the nose 2 (two) times daily. (Patient not taking: Reported on 12/15/2013) 22 g 0  . ondansetron (ZOFRAN) 4 MG tablet Take 1 tablet  (4 mg total) by mouth every 8 (eight) hours as needed for nausea or vomiting. (Patient not taking: Reported on 12/15/2013) 12 tablet 0    Home: Home Living Family/patient expects to be discharged to:: Private residence Living Arrangements: Children Available Help at Discharge:  (pt trying to find someone who can stay with her at d/c) Type of Home: House (duplex) Home Access: Stairs to enter Entrance Stairs-Number of Steps: 2 Entrance Stairs-Rails: Left Home Layout: One level Home Equipment: None   Functional History: Prior Function Level of Independence: Independent Comments: vertigo started 3 weeks ago and had been unable to do housework without stumbling around; states stayed laying down most of the time; mother was helping with children  Functional Status:  Mobility: Bed Mobility Overal bed mobility: Needs Assistance Bed Mobility: Supine to Sit Supine to sit: Supervision, Min guard General bed mobility comments: repots has gotten up to [facility] on her own (advised for safety to call for help) Transfers Overall transfer level: Needs assistance Transfers: Sit to/from Stand Sit to Stand: Min assist General transfer comment: uses UE's on walker or bed and relies on momentum strategy Ambulation/Gait Ambulation/Gait assistance: Mod assist Ambulation Distance (Feet): 7 Feet (and 3') Assistive device: Rolling walker (2 wheeled) Gait Pattern/deviations: Step-through pattern, Trunk flexed, Shuffle, Wide base of support General Gait Details: assist with walker with turns and backing up due to heavy UE reliance    ADL:    Cognition: Cognition Overall Cognitive Status: Within Functional Limits for tasks assessed Orientation Level: Oriented X4 Cognition Arousal/Alertness: Awake/alert Behavior During Therapy: WFL for tasks assessed/performed Overall Cognitive Status: Within Functional Limits for tasks assessed  Physical Exam: Blood pressure 129/75, pulse 86, temperature 100 F  (37.8 C), temperature source Oral, resp. rate 18, height _0  (1.575 m), weight 97.2 kg (214 lb 4.6 oz), last menstrual period 12/13/2013, SpO2 100 %. Physical Exam Constitutional: She is oriented to person, place, and time. She appears well-developed.  HENT: oral mucosa pink and moist, dentition fair Head: Normocephalic.  Eyes: EOM are normal.  Neck: Normal range of motion. Neck supple. No thyromegaly present.  Cardiovascular: Normal rate and regular rhythm. no murmurs Respiratory: Effort normal and breath sounds normal. No respiratory distress.  GI: Bowel sounds are normal. She exhibits no distension. Non tender    Skin: Skin is warm and dry.  Neuro: Alert and oriented. Good insight and awareness, CN exam intact Motor strength is 5/5 bilateral deltoids, biceps, triceps, grip 3/5 in the hip flexors and, 4/5 knee extensors 4/5 ankle dorsiflexor and plantarflexors.  Sensation reduced to light touch in both legs below the mid thighs, left perhaps involved more than right. Reduced proprioception in both legs as well in same distribution. Can sense gross pain. No sensory findings in upper extremities and no sensory level in abdomen. Sitting balance is good Deep tendon reflexes 1+ at patella and absent at ankles bilateral lower extremities Psych: normal affect, behavior. Musc: no edema, no gross pain with ROM/movement.    Results for orders placed or performed during the hospital encounter of 12/15/13 (from the past 48 hour(s))  CBC     Status: Abnormal  Collection Time: 12/18/13  3:22 PM  Result Value Ref Range   WBC 1.7 (L) 4.0 - 10.5 K/uL   RBC 2.30 (L) 3.87 - 5.11 MIL/uL   Hemoglobin 7.6 (L) 12.0 - 15.0 g/dL   HCT 22.0 (L) 36.0 - 46.0 %   MCV 95.7 78.0 - 100.0 fL   MCH 33.0 26.0 - 34.0 pg   MCHC 34.5 30.0 - 36.0 g/dL   RDW 22.4 (H) 11.5 - 15.5 %   Platelets 21 (LL) 150 - 400 K/uL    Comment: CRITICAL VALUE NOTED.  VALUE IS CONSISTENT WITH PREVIOUSLY REPORTED AND CALLED  VALUE. REPEATED TO VERIFY DELTA CHECK NOTED   Basic metabolic panel     Status: None   Collection Time: 12/18/13  3:22 PM  Result Value Ref Range   Sodium 140 137 - 147 mEq/L   Potassium 3.7 3.7 - 5.3 mEq/L   Chloride 105 96 - 112 mEq/L   CO2 22 19 - 32 mEq/L   Glucose, Bld 96 70 - 99 mg/dL   BUN 6 6 - 23 mg/dL   Creatinine, Ser 0.71 0.50 - 1.10 mg/dL   Calcium 8.7 8.4 - 10.5 mg/dL   GFR calc non Af Amer >90 >90 mL/min   GFR calc Af Amer >90 >90 mL/min    Comment: (NOTE) The eGFR has been calculated using the CKD EPI equation. This calculation has not been validated in all clinical situations. eGFR's persistently <90 mL/min signify possible Chronic Kidney Disease.    Anion gap 13 5 - 15   Mr Jeri Cos Wo Contrast  12/18/2013   CLINICAL DATA:  Acute onset lower extremity numbness and weakness, unable to walk. Untreated vitamin B12 deficiency. Anemia.  EXAM: MRI HEAD WITHOUT AND WITH CONTRAST  MRI CERVICAL SPINE WITHOUT AND WITH CONTRAST  TECHNIQUE: Multiplanar, multiecho pulse sequences of the brain and surrounding structures, and cervical spine, to include the craniocervical junction and cervicothoracic junction, were obtained without and with intravenous contrast.  CONTRAST:  41m MULTIHANCE GADOBENATE DIMEGLUMINE 529 MG/ML IV SOLN  COMPARISON:  CT of the head December 16, 2013 and MRI of the thoracic spine December 17, 2013  FINDINGS: MRI HEAD FINDINGS  Mild motion degraded examination.  No reduced diffusion to suggest acute ischemia nor hyperacute demyelination. No susceptibility artifact to suggest hemorrhage. Less than 10 supratentorial sub cm white matter FLAIR T2 hyperintensities, predominately in a subcortical white matter distribution. Scattered perivascular spaces. No mass lesions, mass effect nor abnormal parenchymal enhancement.  No abnormal extra-axial fluid collections. No abnormal a extra-axial enhancement. No extra-axial masses. Normal major intracranial vascular flow voids  seen at the skull base.  Trace paranasal sinus mucosal thickening without air-fluid levels. Ocular globes and orbital contents are unremarkable though not tailored for evaluation. Mastoid air cells appear well-aerated. Concave superior margin of the pituitary could reflect arachnoid cyst or volume loss.  MRI CERVICAL SPINE FINDINGS  Cervical vertebral bodies and posterior elements are intact and aligned with maintenance of cervical lordosis. Intervertebral discs demonstrate normal morphology and signal characteristics. No abnormal bone marrow signal. No abnormal osseous or intradiscal enhancement.  Cervical spinal cord appears normal in morphology. The axial T2 sequences are moderately motion degraded, which limits sensitivity for cord signal abnormality, however on the sagittal T2 there is linear T2 bright signal within the dorsum of the spinal cord from T2 through T3, and towards the LEFT at T5 through T7. No syrinx. No suspicious cord, leptomeningeal or epidural enhancement. Included prevertebral and paraspinal soft tissues  are unremarkable.  Level by level evaluation demonstrates no significant disc bulge, canal stenosis or neural foraminal narrowing.  IMPRESSION: MRI HEAD: Less than 10 nonspecific subcentimeter supratentorial white matter T2 hyperintensities, in a atypical distribution for classic demyelination. No abnormal parenchymal enhancement or parenchymal brain volume loss.  MRI CERVICAL SPINE: Abnormal nonenhancing signal within spinal cord, limited assessment on the axial sequences due to motion, findings compatible with dorsal column pathology which can be seen with vitamin B12 deficiency, viral entities. No myelomalacia.  No nerve compressive changes.   Electronically Signed   By: Elon Alas   On: 12/18/2013 22:42   Mr Cervical Spine W Wo Contrast  12/18/2013   CLINICAL DATA:  Acute onset lower extremity numbness and weakness, unable to walk. Untreated vitamin B12 deficiency. Anemia.   EXAM: MRI HEAD WITHOUT AND WITH CONTRAST  MRI CERVICAL SPINE WITHOUT AND WITH CONTRAST  TECHNIQUE: Multiplanar, multiecho pulse sequences of the brain and surrounding structures, and cervical spine, to include the craniocervical junction and cervicothoracic junction, were obtained without and with intravenous contrast.  CONTRAST:  59m MULTIHANCE GADOBENATE DIMEGLUMINE 529 MG/ML IV SOLN  COMPARISON:  CT of the head December 16, 2013 and MRI of the thoracic spine December 17, 2013  FINDINGS: MRI HEAD FINDINGS  Mild motion degraded examination.  No reduced diffusion to suggest acute ischemia nor hyperacute demyelination. No susceptibility artifact to suggest hemorrhage. Less than 10 supratentorial sub cm white matter FLAIR T2 hyperintensities, predominately in a subcortical white matter distribution. Scattered perivascular spaces. No mass lesions, mass effect nor abnormal parenchymal enhancement.  No abnormal extra-axial fluid collections. No abnormal a extra-axial enhancement. No extra-axial masses. Normal major intracranial vascular flow voids seen at the skull base.  Trace paranasal sinus mucosal thickening without air-fluid levels. Ocular globes and orbital contents are unremarkable though not tailored for evaluation. Mastoid air cells appear well-aerated. Concave superior margin of the pituitary could reflect arachnoid cyst or volume loss.  MRI CERVICAL SPINE FINDINGS  Cervical vertebral bodies and posterior elements are intact and aligned with maintenance of cervical lordosis. Intervertebral discs demonstrate normal morphology and signal characteristics. No abnormal bone marrow signal. No abnormal osseous or intradiscal enhancement.  Cervical spinal cord appears normal in morphology. The axial T2 sequences are moderately motion degraded, which limits sensitivity for cord signal abnormality, however on the sagittal T2 there is linear T2 bright signal within the dorsum of the spinal cord from T2 through T3, and  towards the LEFT at T5 through T7. No syrinx. No suspicious cord, leptomeningeal or epidural enhancement. Included prevertebral and paraspinal soft tissues are unremarkable.  Level by level evaluation demonstrates no significant disc bulge, canal stenosis or neural foraminal narrowing.  IMPRESSION: MRI HEAD: Less than 10 nonspecific subcentimeter supratentorial white matter T2 hyperintensities, in a atypical distribution for classic demyelination. No abnormal parenchymal enhancement or parenchymal brain volume loss.  MRI CERVICAL SPINE: Abnormal nonenhancing signal within spinal cord, limited assessment on the axial sequences due to motion, findings compatible with dorsal column pathology which can be seen with vitamin B12 deficiency, viral entities. No myelomalacia.  No nerve compressive changes.   Electronically Signed   By: CElon Alas  On: 12/18/2013 22:42   Mr Thoracic Spine W Wo Contrast  12/17/2013   CLINICAL DATA:  Bilateral leg weakness. Mid and low back pain. Initial encounter.  EXAM: MRI THORACIC AND LUMBAR SPINE WITHOUT AND WITH CONTRAST  TECHNIQUE: Multiplanar and multiecho pulse sequences of the thoracic and lumbar spine were obtained without and  with intravenous contrast.  CONTRAST:  56m MULTIHANCE GADOBENATE DIMEGLUMINE 529 MG/ML IV SOLN  COMPARISON:  None.  FINDINGS: MR THORACIC SPINE FINDINGS  Segmentation: Counting was performed from the craniocervical junction. Segmentation is anatomic with 7 cervical, 12 thoracic and 5 lumbar type vertebral bodies.  Alignment: Anatomic.  Vertebrae: Normal.  Active red marrow throughout the spine.  Cord: There are small peripheral cord lesions in the lateral aspect of the cord bilaterally throughout the thoracic spine. The show increased T2 signal and are only well appreciated on the axial images due to partial volume averaging on the sagittal images. When correlating axial and sagittal images, they be can be seen on the inversion recovery images.  Most prominent lesion is at the T5-T6 level on the LEFT. None of these lesions show post gadolinium enhancement to suggest an active demyelinating disease these are confined to white matter tracks without involvement of the gray matter. These are also visualized in the lower cervical cord at C7-T1.  Paraspinal tissues: Normal.  Disc levels:  Normal thoracic intervertebral discs.  No stenosis.  MR LUMBAR SPINE FINDINGS  Alignment:  Anatomic.  Vertebrae:  Normal.  Conus medullaris: Normal at L1-L2.  Paraspinal tissues: Normal.  Disc levels:  Normal.  No disc herniations or degenerative disease.  After contrast administration, there is no abnormal enhancement in the lumbar spine.  IMPRESSION: 1. Multiple bilateral thoracic cord lesions in the lateral white matter, most compatible with prior demyelinating disease. No active demyelinating disease in the thoracic spine. This could be seen with multiple sclerosis, NMO, vasculitis, neurosarcoidosis, or ADEM among other causes. In this age group and with this pattern, multiple sclerosis is most likely. 2. Partial visualization of the lower cervical cord shows white matter lesions an cervical spine MRI with and without contrast is recommended to complete the assessment. An MRI of the brain should also be considered to assess for involvement. 3. Normal MRI of the lumbar spine.   Electronically Signed   By: GDereck LigasM.D.   On: 12/17/2013 16:00   Mr Lumbar Spine W Wo Contrast  12/17/2013   CLINICAL DATA:  Bilateral leg weakness. Mid and low back pain. Initial encounter.  EXAM: MRI THORACIC AND LUMBAR SPINE WITHOUT AND WITH CONTRAST  TECHNIQUE: Multiplanar and multiecho pulse sequences of the thoracic and lumbar spine were obtained without and with intravenous contrast.  CONTRAST:  22mMULTIHANCE GADOBENATE DIMEGLUMINE 529 MG/ML IV SOLN  COMPARISON:  None.  FINDINGS: MR THORACIC SPINE FINDINGS  Segmentation: Counting was performed from the craniocervical junction.  Segmentation is anatomic with 7 cervical, 12 thoracic and 5 lumbar type vertebral bodies.  Alignment: Anatomic.  Vertebrae: Normal.  Active red marrow throughout the spine.  Cord: There are small peripheral cord lesions in the lateral aspect of the cord bilaterally throughout the thoracic spine. The show increased T2 signal and are only well appreciated on the axial images due to partial volume averaging on the sagittal images. When correlating axial and sagittal images, they be can be seen on the inversion recovery images. Most prominent lesion is at the T5-T6 level on the LEFT. None of these lesions show post gadolinium enhancement to suggest an active demyelinating disease these are confined to white matter tracks without involvement of the gray matter. These are also visualized in the lower cervical cord at C7-T1.  Paraspinal tissues: Normal.  Disc levels:  Normal thoracic intervertebral discs.  No stenosis.  MR LUMBAR SPINE FINDINGS  Alignment:  Anatomic.  Vertebrae:  Normal.  Conus medullaris: Normal at L1-L2.  Paraspinal tissues: Normal.  Disc levels:  Normal.  No disc herniations or degenerative disease.  After contrast administration, there is no abnormal enhancement in the lumbar spine.  IMPRESSION: 1. Multiple bilateral thoracic cord lesions in the lateral white matter, most compatible with prior demyelinating disease. No active demyelinating disease in the thoracic spine. This could be seen with multiple sclerosis, NMO, vasculitis, neurosarcoidosis, or ADEM among other causes. In this age group and with this pattern, multiple sclerosis is most likely. 2. Partial visualization of the lower cervical cord shows white matter lesions an cervical spine MRI with and without contrast is recommended to complete the assessment. An MRI of the brain should also be considered to assess for involvement. 3. Normal MRI of the lumbar spine.   Electronically Signed   By: Dereck Ligas M.D.   On: 12/17/2013 16:00        Medical Problem List and Plan: 1. Functional deficits secondary to severe B12 deficiency with peripheral polyneuropathy. Pt generally debilitated as well 2.  DVT Prophylaxis/Anticoagulation: SCDs. Monitor for any signs of DVT 3. Pain Management: Tylenol as needed 4. Mood/depression: Wellbutrin 150 mg daily, Xanax as needed. Provide emotional support 5. Neuropsych: This patient is capable of making decisions on her own behalf. 6. Skin/Wound Care: Routine skin checks 7. Fluids/Electrolytes/Nutrition: Strict I and os. Follow-up chemistries. Provide nutritional supplements as needed 8. Probable UTI. Urine cultures pending 12/18/2013. Continue Cipro and await cultures 9. History of bronchial asthma. Albuterol nebulizer as needed. 10. Pancytopenia. Follow-up labs and monitor closely for any clinical sequelae    Post Admission Physician Evaluation: 1. Functional deficits secondary  to severe B12 deficiency polyneuropathy with superimposed debilitation due to poor po intake and multiple medical. 2. Patient is admitted to receive collaborative, interdisciplinary care between the physiatrist, rehab nursing staff, and therapy team. 3. Patient's level of medical complexity and substantial therapy needs in context of that medical necessity cannot be provided at a lesser intensity of care such as a SNF. 4. Patient has experienced substantial functional loss from his/her baseline which was documented above under the "Functional History" and "Functional Status" headings.  Judging by the patient's diagnosis, physical exam, and functional history, the patient has potential for functional progress which will result in measurable gains while on inpatient rehab.  These gains will be of substantial and practical use upon discharge  in facilitating mobility and self-care at the household level. 5. Physiatrist will provide 24 hour management of medical needs as well as oversight of the therapy plan/treatment  and provide guidance as appropriate regarding the interaction of the two. 6. 24 hour rehab nursing will assist with bladder management, bowel management, safety, skin/wound care, disease management, medication administration, pain management and patient education  and help integrate therapy concepts, techniques,education, etc. 7. PT will assess and treat for/with: Lower extremity strength, range of motion, stamina, balance, functional mobility, safety, adaptive techniques and equipment, NMR, orthotics, activity tolerance, ego support, family education, community reintegration.   Goals are: mod I with basic mobility and transfers. 8. OT will assess and treat for/with: ADL's, functional mobility, safety, upper extremity strength, adaptive techniques and equipment, NMR, leisure awareness, family and patient education, activity tolerance.   Goals are: mod I. Therapy may proceed with showering this patient. 9. SLP will assess and treat for/with: n/a.  Goals are: n/a. 10. Case Management and Social Worker will assess and treat for psychological issues and discharge planning. 11. Team conference will be held weekly to  assess progress toward goals and to determine barriers to discharge. 12. Patient will receive at least 3 hours of therapy per day at least 5 days per week. 13. ELOS: 12-16 days       14. Prognosis:  excellent     Meredith Staggers, MD, [facility] 12/19/2013   12/19/2013

## 2013-12-19 NOTE — Progress Notes (Signed)
Rehab admissions - Evaluated for possible admission.  I met with patient and I gave her booklets about inpatient rehab.  Patient is interested in coming to inpatient rehab.  I will check with MD for medical readiness.  Call me for questions.  #[phone]

## 2013-12-19 NOTE — Progress Notes (Signed)
Rehab admissions - I have discussed this case with Dr. Riley KillSwartz.  He has approved inpatient rehab admission for today.  I do have a bed available and will admit to inpatient rehab today.  Call me for questions.  #[phone]

## 2013-12-19 NOTE — Progress Notes (Signed)
Pt left at this time with [facility]

## 2013-12-19 NOTE — Progress Notes (Signed)
Physical Therapy Treatment Patient Details Name: Jill Summers MRN: [id number] DOB: 01-05-1986 Today's Date: 12/19/2013    History of Present Illness Patient is a 28 y/o female admitted with sudden onset of lower extremity weakness and numbness, unable to walk.She has recently had a boils on the inside of her thighs which were lanced, she has also developed cellulitis of the umbilicus.  Found to have pancytopenia.  Undergoing work up for Vit B12 deficiency.    PT Comments    Pt  Progressing; she appears cognitively intact, follows commands and is oriented but cannot tell me if she had therapy (OT) earlier today, has flat affect throughout session; Will benefit from CIR  Follow Up Recommendations  CIR;Supervision/Assistance - 24 hour     Equipment Recommendations  Rolling walker with 5" wheels;3in1 (PT)    Recommendations for Other Services       Precautions / Restrictions Precautions Precautions: Fall Restrictions Weight Bearing Restrictions: No    Mobility  Bed Mobility Overal bed mobility: Needs Assistance Bed Mobility: Supine to Sit     Supine to sit: Supervision     General bed mobility comments: incr time  Transfers Overall transfer level: Needs assistance Equipment used: Rolling walker (2 wheeled) Transfers: Sit to/from Stand Sit to Stand: Min assist;Mod assist (moad assist for stand to sit) Stand pivot transfers: Min assist       General transfer comment: relies on momentum to stand, requires multi-modal cuesand repetition  for  correct hand placement  Ambulation/Gait Ambulation/Gait assistance: Mod assist Ambulation Distance (Feet): 22 Feet Assistive device: Rolling walker (2 wheeled) Gait Pattern/deviations: Step-to pattern;Decreased step length - right;Decreased step length - left     General Gait Details: assist with walker  and backing up due to heavy UE reliance   Stairs            Wheelchair Mobility    Modified Rankin (Stroke  Patients Only)       Balance Overall balance assessment: Needs assistance Sitting-balance support: Feet supported;No upper extremity supported Sitting balance-Leahy Scale: Good     Standing balance support: Bilateral upper extremity supported;During functional activity Standing balance-Leahy Scale: Poor Standing balance comment: heavily relies on UE support for standing                    Cognition Arousal/Alertness: Awake/alert Behavior During Therapy: WFL for tasks assessed/performed, flat affect Overall Cognitive Status: Within Functional Limits for tasks assessed                      Exercises General Exercises - Lower Extremity Long Arc Quad: AROM;Strengthening;Both;10 reps;Seated Hip ABduction/ADduction: Strengthening;10 reps;Seated (isometrics) Hip Flexion/Marching: Seated;10 reps;Both;Strengthening    General Comments        Pertinent Vitals/Pain Pain Assessment: No/denies pain    Home Living Family/patient expects to be discharged to:: Private residence Living Arrangements: Children Available Help at Discharge:  (pt trying to find someone who can stay with her at d/c) Type of Home: House (duplex) Home Access: Stairs to enter Entrance Stairs-Rails: Left Home Layout: One level Home Equipment: None      Prior Function Level of Independence: Independent      Comments: has 2 children, ages 623 and 8   PT Goals (current goals can now be found in the care plan section) Acute Rehab PT Goals Patient Stated Goal: To return to independent Time For Goal Achievement: 01/01/14 Potential to Achieve Goals: Good Progress towards PT goals: Progressing toward goals  Frequency  Min 4X/week    PT Plan Current plan remains appropriate    Co-evaluation             End of Session Equipment Utilized During Treatment: Gait belt Activity Tolerance: Patient tolerated treatment well Patient left: in chair;with call bell/phone within reach;with  family/visitor present     Time: [id number] PT Time Calculation (min) (ACUTE ONLY): 19 min  Charges:  $Gait Training: 8-22 mins                    G Codes:      Jill Summers 12/19/2013, 12:33 PM

## 2013-12-19 NOTE — Discharge Instructions (Signed)

## 2013-12-19 NOTE — Progress Notes (Signed)
Subjective: No new complaints.   Objective: Current vital signs: BP 129/75 mmHg  Pulse 86  Temp(Src) 100 F (37.8 C) (Oral)  Resp 18  Ht 5\' 2"  (1.575 m)  Wt 97.2 kg (214 lb 4.6 oz)  BMI 39.18 kg/m2  SpO2 100%  LMP 12/13/2013 Vital signs in last 24 hours: Temp:  [98.5 F (36.9 C)-100 F (37.8 C)] 100 F (37.8 C) (11/20 0552) Pulse Rate:  [81-91] 86 (11/20 0552) Resp:  [18-80] 18 (11/20 0552) BP: (104-129)/(58-75) 129/75 mmHg (11/20 0552) SpO2:  [100 %] 100 % (11/20 0552)  Intake/Output from previous day:   Intake/Output this shift:   Nutritional status: Diet regular  Neurologic Exam: General: NAD Mental Status: Alert, oriented, thought content appropriate.  Speech fluent without evidence of aphasia.  Able to follow 3 step commands without difficulty. Cranial Nerves: II: Discs flat bilaterally; Visual fields grossly normal, pupils equal, round, reactive to light and accommodation III,IV, VI: ptosis not present, extra-ocular motions intact bilaterally V,VII: smile symmetric, facial light touch sensation normal bilaterally VIII: hearing normal bilaterally IX,X: gag reflex present XI: bilateral shoulder shrug XII: midline tongue extension without atrophy or fasciculations  Motor: Right : Upper extremity   5/5    Left:     Upper extremity   5/5  Lower extremity   4/5     Lower extremity   4/5 --Distal LE was 5/5 Tone and bulk:normal tone throughout; no atrophy noted Sensory: Pinprick and light touch intact throughout, bilaterally absent vibratory at knees and ankles.  Deep Tendon Reflexes:  Right: Upper Extremity   Left: Upper extremity   biceps (C-5 to C-6) 2/4   biceps (C-5 to C-6) 2/4 tricep (C7) 2/4    triceps (C7) 2/4 Brachioradialis (C6) 2/4  Brachioradialis (C6) 2/4  Lower Extremity Lower Extremity  quadriceps (L-2 to L-4) 1/4   quadriceps (L-2 to L-4) 0/4 Achilles (S1) 0/4   Achilles (S1) 0/4  Plantars: Mute bilateral Cerebellar: normal  finger-to-nose,  normal heel-to-shin test Gait: not tested due to safety CV: pulses palpable throughout    Lab Results: Basic Metabolic Panel:  Recent Labs Lab 12/15/13 2108 12/16/13 0110 12/17/13 0425 12/18/13 1522  NA 140 141 141 140  K 4.1 3.8 4.0 3.7  CL 101 103 106 105  CO2 24 22 22 22   GLUCOSE 98 103* 101* 96  BUN 14 14 7 6   CREATININE 0.95 0.89 0.79 0.71  CALCIUM 9.0 8.6 8.4 8.7    Liver Function Tests:  Recent Labs Lab 12/15/13 2108 12/16/13 0110  AST 67* 62*  ALT 31 27  ALKPHOS 25* 24*  BILITOT 3.2* 3.1*  PROT 7.5 6.8  ALBUMIN 4.0 3.6   No results for input(s): LIPASE, AMYLASE in the last 168 hours. No results for input(s): AMMONIA in the last 168 hours.  CBC:  Recent Labs Lab 12/15/13 2108 12/16/13 0110 12/17/13 0425 12/18/13 1522  WBC 1.4* 1.1* 1.5* 1.7*  NEUTROABS 0.4* 0.3*  --   --   HGB 5.5* 5.0* 7.8* 7.6*  HCT 16.5* 14.6* 22.3* 22.0*  MCV 101.9* 102.1* 96.1 95.7  PLT 37* 30* 28* 21*    Cardiac Enzymes: No results for input(s): CKTOTAL, CKMB, CKMBINDEX, TROPONINI in the last 168 hours.  Lipid Panel: No results for input(s): CHOL, TRIG, HDL, CHOLHDL, VLDL, LDLCALC in the last 168 hours.  CBG: No results for input(s): GLUCAP in the last 168 hours.  Microbiology: Results for orders placed or performed during the hospital encounter of 12/15/13  Blood culture (  routine x 2)     Status: None (Preliminary result)   Collection Time: 12/15/13  9:08 PM  Result Value Ref Range Status   Specimen Description BLOOD LEFT ANTECUBITAL  Final   Special Requests BOTTLES DRAWN AEROBIC AND ANAEROBIC 5CC  Final   Culture  Setup Time   Final    12/16/2013 00:55 Performed at [facility]    Culture   Final           BLOOD CULTURE RECEIVED NO GROWTH TO DATE CULTURE WILL BE HELD FOR 5 DAYS BEFORE ISSUING A FINAL NEGATIVE REPORT Performed at [facility]    Report Status PENDING  Incomplete  Blood culture (routine x 2)     Status:  None (Preliminary result)   Collection Time: 12/15/13  9:08 PM  Result Value Ref Range Status   Specimen Description BLOOD RIGHT ANTECUBITAL  Final   Special Requests BOTTLES DRAWN AEROBIC AND ANAEROBIC 3CC  Final   Culture  Setup Time   Final    12/16/2013 00:55 Performed at [facility]    Culture   Final           BLOOD CULTURE RECEIVED NO GROWTH TO DATE CULTURE WILL BE HELD FOR 5 DAYS BEFORE ISSUING A FINAL NEGATIVE REPORT Performed at [facility]    Report Status PENDING  Incomplete  MRSA PCR Screening     Status: None   Collection Time: 12/16/13  1:35 AM  Result Value Ref Range Status   MRSA by PCR NEGATIVE NEGATIVE Final    Comment:        The GeneXpert MRSA Assay (FDA approved for NASAL specimens only), is one component of a comprehensive MRSA colonization surveillance program. It is not intended to diagnose MRSA infection nor to guide or monitor treatment for MRSA infections.     Coagulation Studies: No results for input(s): LABPROT, INR in the last 72 hours.  Imaging: Mr Laqueta Jean Wo Contrast  12/18/2013   CLINICAL DATA:  Acute onset lower extremity numbness and weakness, unable to walk. Untreated vitamin B12 deficiency. Anemia.  EXAM: MRI HEAD WITHOUT AND WITH CONTRAST  MRI CERVICAL SPINE WITHOUT AND WITH CONTRAST  TECHNIQUE: Multiplanar, multiecho pulse sequences of the brain and surrounding structures, and cervical spine, to include the craniocervical junction and cervicothoracic junction, were obtained without and with intravenous contrast.  CONTRAST:  20mL MULTIHANCE GADOBENATE DIMEGLUMINE 529 MG/ML IV SOLN  COMPARISON:  CT of the head December 16, 2013 and MRI of the thoracic spine December 17, 2013  FINDINGS: MRI HEAD FINDINGS  Mild motion degraded examination.  No reduced diffusion to suggest acute ischemia nor hyperacute demyelination. No susceptibility artifact to suggest hemorrhage. Less than 10 supratentorial sub cm white matter FLAIR T2  hyperintensities, predominately in a subcortical white matter distribution. Scattered perivascular spaces. No mass lesions, mass effect nor abnormal parenchymal enhancement.  No abnormal extra-axial fluid collections. No abnormal a extra-axial enhancement. No extra-axial masses. Normal major intracranial vascular flow voids seen at the skull base.  Trace paranasal sinus mucosal thickening without air-fluid levels. Ocular globes and orbital contents are unremarkable though not tailored for evaluation. Mastoid air cells appear well-aerated. Concave superior margin of the pituitary could reflect arachnoid cyst or volume loss.  MRI CERVICAL SPINE FINDINGS  Cervical vertebral bodies and posterior elements are intact and aligned with maintenance of cervical lordosis. Intervertebral discs demonstrate normal morphology and signal characteristics. No abnormal bone marrow signal. No abnormal osseous or intradiscal enhancement.  Cervical  spinal cord appears normal in morphology. The axial T2 sequences are moderately motion degraded, which limits sensitivity for cord signal abnormality, however on the sagittal T2 there is linear T2 bright signal within the dorsum of the spinal cord from T2 through T3, and towards the LEFT at T5 through T7. No syrinx. No suspicious cord, leptomeningeal or epidural enhancement. Included prevertebral and paraspinal soft tissues are unremarkable.  Level by level evaluation demonstrates no significant disc bulge, canal stenosis or neural foraminal narrowing.  IMPRESSION: MRI HEAD: Less than 10 nonspecific subcentimeter supratentorial white matter T2 hyperintensities, in a atypical distribution for classic demyelination. No abnormal parenchymal enhancement or parenchymal brain volume loss.  MRI CERVICAL SPINE: Abnormal nonenhancing signal within spinal cord, limited assessment on the axial sequences due to motion, findings compatible with dorsal column pathology which can be seen with vitamin B12  deficiency, viral entities. No myelomalacia.  No nerve compressive changes.   Electronically Signed   By: Awilda Metro   On: 12/18/2013 22:42   Mr Cervical Spine W Wo Contrast  12/18/2013   CLINICAL DATA:  Acute onset lower extremity numbness and weakness, unable to walk. Untreated vitamin B12 deficiency. Anemia.  EXAM: MRI HEAD WITHOUT AND WITH CONTRAST  MRI CERVICAL SPINE WITHOUT AND WITH CONTRAST  TECHNIQUE: Multiplanar, multiecho pulse sequences of the brain and surrounding structures, and cervical spine, to include the craniocervical junction and cervicothoracic junction, were obtained without and with intravenous contrast.  CONTRAST:  20mL MULTIHANCE GADOBENATE DIMEGLUMINE 529 MG/ML IV SOLN  COMPARISON:  CT of the head December 16, 2013 and MRI of the thoracic spine December 17, 2013  FINDINGS: MRI HEAD FINDINGS  Mild motion degraded examination.  No reduced diffusion to suggest acute ischemia nor hyperacute demyelination. No susceptibility artifact to suggest hemorrhage. Less than 10 supratentorial sub cm white matter FLAIR T2 hyperintensities, predominately in a subcortical white matter distribution. Scattered perivascular spaces. No mass lesions, mass effect nor abnormal parenchymal enhancement.  No abnormal extra-axial fluid collections. No abnormal a extra-axial enhancement. No extra-axial masses. Normal major intracranial vascular flow voids seen at the skull base.  Trace paranasal sinus mucosal thickening without air-fluid levels. Ocular globes and orbital contents are unremarkable though not tailored for evaluation. Mastoid air cells appear well-aerated. Concave superior margin of the pituitary could reflect arachnoid cyst or volume loss.  MRI CERVICAL SPINE FINDINGS  Cervical vertebral bodies and posterior elements are intact and aligned with maintenance of cervical lordosis. Intervertebral discs demonstrate normal morphology and signal characteristics. No abnormal bone marrow signal. No  abnormal osseous or intradiscal enhancement.  Cervical spinal cord appears normal in morphology. The axial T2 sequences are moderately motion degraded, which limits sensitivity for cord signal abnormality, however on the sagittal T2 there is linear T2 bright signal within the dorsum of the spinal cord from T2 through T3, and towards the LEFT at T5 through T7. No syrinx. No suspicious cord, leptomeningeal or epidural enhancement. Included prevertebral and paraspinal soft tissues are unremarkable.  Level by level evaluation demonstrates no significant disc bulge, canal stenosis or neural foraminal narrowing.  IMPRESSION: MRI HEAD: Less than 10 nonspecific subcentimeter supratentorial white matter T2 hyperintensities, in a atypical distribution for classic demyelination. No abnormal parenchymal enhancement or parenchymal brain volume loss.  MRI CERVICAL SPINE: Abnormal nonenhancing signal within spinal cord, limited assessment on the axial sequences due to motion, findings compatible with dorsal column pathology which can be seen with vitamin B12 deficiency, viral entities. No myelomalacia.  No nerve compressive changes.  Electronically Signed   By: Awilda Metroourtnay  Bloomer   On: 12/18/2013 22:42   Mr Thoracic Spine W Wo Contrast  12/17/2013   CLINICAL DATA:  Bilateral leg weakness. Mid and low back pain. Initial encounter.  EXAM: MRI THORACIC AND LUMBAR SPINE WITHOUT AND WITH CONTRAST  TECHNIQUE: Multiplanar and multiecho pulse sequences of the thoracic and lumbar spine were obtained without and with intravenous contrast.  CONTRAST:  20mL MULTIHANCE GADOBENATE DIMEGLUMINE 529 MG/ML IV SOLN  COMPARISON:  None.  FINDINGS: MR THORACIC SPINE FINDINGS  Segmentation: Counting was performed from the craniocervical junction. Segmentation is anatomic with 7 cervical, 12 thoracic and 5 lumbar type vertebral bodies.  Alignment: Anatomic.  Vertebrae: Normal.  Active red marrow throughout the spine.  Cord: There are small peripheral  cord lesions in the lateral aspect of the cord bilaterally throughout the thoracic spine. The show increased T2 signal and are only well appreciated on the axial images due to partial volume averaging on the sagittal images. When correlating axial and sagittal images, they be can be seen on the inversion recovery images. Most prominent lesion is at the T5-T6 level on the LEFT. None of these lesions show post gadolinium enhancement to suggest an active demyelinating disease these are confined to white matter tracks without involvement of the gray matter. These are also visualized in the lower cervical cord at C7-T1.  Paraspinal tissues: Normal.  Disc levels:  Normal thoracic intervertebral discs.  No stenosis.  MR LUMBAR SPINE FINDINGS  Alignment:  Anatomic.  Vertebrae:  Normal.  Conus medullaris: Normal at L1-L2.  Paraspinal tissues: Normal.  Disc levels:  Normal.  No disc herniations or degenerative disease.  After contrast administration, there is no abnormal enhancement in the lumbar spine.  IMPRESSION: 1. Multiple bilateral thoracic cord lesions in the lateral white matter, most compatible with prior demyelinating disease. No active demyelinating disease in the thoracic spine. This could be seen with multiple sclerosis, NMO, vasculitis, neurosarcoidosis, or ADEM among other causes. In this age group and with this pattern, multiple sclerosis is most likely. 2. Partial visualization of the lower cervical cord shows white matter lesions an cervical spine MRI with and without contrast is recommended to complete the assessment. An MRI of the brain should also be considered to assess for involvement. 3. Normal MRI of the lumbar spine.   Electronically Signed   By: Andreas NewportGeoffrey  Lamke M.D.   On: 12/17/2013 16:00   Mr Lumbar Spine W Wo Contrast  12/17/2013   CLINICAL DATA:  Bilateral leg weakness. Mid and low back pain. Initial encounter.  EXAM: MRI THORACIC AND LUMBAR SPINE WITHOUT AND WITH CONTRAST  TECHNIQUE:  Multiplanar and multiecho pulse sequences of the thoracic and lumbar spine were obtained without and with intravenous contrast.  CONTRAST:  20mL MULTIHANCE GADOBENATE DIMEGLUMINE 529 MG/ML IV SOLN  COMPARISON:  None.  FINDINGS: MR THORACIC SPINE FINDINGS  Segmentation: Counting was performed from the craniocervical junction. Segmentation is anatomic with 7 cervical, 12 thoracic and 5 lumbar type vertebral bodies.  Alignment: Anatomic.  Vertebrae: Normal.  Active red marrow throughout the spine.  Cord: There are small peripheral cord lesions in the lateral aspect of the cord bilaterally throughout the thoracic spine. The show increased T2 signal and are only well appreciated on the axial images due to partial volume averaging on the sagittal images. When correlating axial and sagittal images, they be can be seen on the inversion recovery images. Most prominent lesion is at the T5-T6 level on the LEFT. None  of these lesions show post gadolinium enhancement to suggest an active demyelinating disease these are confined to white matter tracks without involvement of the gray matter. These are also visualized in the lower cervical cord at C7-T1.  Paraspinal tissues: Normal.  Disc levels:  Normal thoracic intervertebral discs.  No stenosis.  MR LUMBAR SPINE FINDINGS  Alignment:  Anatomic.  Vertebrae:  Normal.  Conus medullaris: Normal at L1-L2.  Paraspinal tissues: Normal.  Disc levels:  Normal.  No disc herniations or degenerative disease.  After contrast administration, there is no abnormal enhancement in the lumbar spine.  IMPRESSION: 1. Multiple bilateral thoracic cord lesions in the lateral white matter, most compatible with prior demyelinating disease. No active demyelinating disease in the thoracic spine. This could be seen with multiple sclerosis, NMO, vasculitis, neurosarcoidosis, or ADEM among other causes. In this age group and with this pattern, multiple sclerosis is most likely. 2. Partial visualization of the  lower cervical cord shows white matter lesions an cervical spine MRI with and without contrast is recommended to complete the assessment. An MRI of the brain should also be considered to assess for involvement. 3. Normal MRI of the lumbar spine.   Electronically Signed   By: Andreas Newport M.D.   On: 12/17/2013 16:00    Medications:  Scheduled: . buPROPion  150 mg Oral Daily  . ceFEPime (MAXIPIME) IV  2 g Intravenous Q8H  . feeding supplement (ENSURE COMPLETE)  237 mL Oral TID BM    Assessment/Plan:  28 year old lady with previously untreated vitamin B 12 deficiency presenting with lower extremity weakness and numbness, most likely manifestations of peripheral neuropathy as well as subacute combined degeneration. Thoracic cord lesions are most likely manifestations of vitamin B 12 deficiency. MRI studies of the brain and cervical spinal cord without and with contrast media not consistent with demyelinating disease and most consistent with B12 deficiency. Recommend Continue Parenteral B12 replacement and agree with PT. Neurology will S/O    Felicie Morn PA-C [facility] [phone]  12/19/2013, 9:38 AM

## 2013-12-19 NOTE — Interval H&P Note (Signed)
Jill Summers was admitted today to Inpatient Rehabilitation with the diagnosis of b12 polyneuropathy.  The patient's history has been reviewed, patient examined, and there is no change in status.  Patient continues to be appropriate for intensive inpatient rehabilitation.  I have reviewed the patient's chart and labs.  Questions were answered to the patient's satisfaction.  SWARTZ,ZACHARY T 12/19/2013, 8:05 PM

## 2013-12-19 NOTE — Progress Notes (Signed)
Writer called [facility] for transport.

## 2013-12-20 ENCOUNTER — Inpatient Hospital Stay (HOSPITAL_COMMUNITY): Payer: Medicaid Other

## 2013-12-20 ENCOUNTER — Encounter (HOSPITAL_COMMUNITY): Payer: Self-pay | Admitting: Rehabilitation

## 2013-12-20 ENCOUNTER — Inpatient Hospital Stay (HOSPITAL_COMMUNITY): Payer: Medicaid Other | Admitting: Occupational Therapy

## 2013-12-20 DIAGNOSIS — G63 Polyneuropathy in diseases classified elsewhere: Secondary | ICD-10-CM

## 2013-12-20 DIAGNOSIS — D61818 Other pancytopenia: Secondary | ICD-10-CM

## 2013-12-20 DIAGNOSIS — E531 Pyridoxine deficiency: Secondary | ICD-10-CM

## 2013-12-20 MED ORDER — BOOST / RESOURCE BREEZE PO LIQD
1.0000 | Freq: Three times a day (TID) | ORAL | Status: DC
  Administered 2013-12-20 – 2013-12-29 (×19): 1 via ORAL

## 2013-12-20 NOTE — Plan of Care (Signed)
Problem: RH BOWEL ELIMINATION Goal: RH STG MANAGE BOWEL WITH ASSISTANCE STG Manage Bowel with min Assistance.  Outcome: Progressing Goal: RH STG MANAGE BOWEL W/MEDICATION W/ASSISTANCE STG Manage Bowel with Medication with min Assistance.  Outcome: Progressing  Problem: RH BLADDER ELIMINATION Goal: RH STG MANAGE BLADDER WITH ASSISTANCE STG Manage Bladder With min Assistance  Outcome: Progressing Goal: RH STG MANAGE BLADDER WITH EQUIPMENT WITH ASSISTANCE STG Manage Bladder With Equipment With min Assistance  Outcome: Progressing  Problem: RH SKIN INTEGRITY Goal: RH STG SKIN FREE OF INFECTION/BREAKDOWN Remain free from infection and breakdown while on rehab with min assist  Outcome: Progressing Goal: RH STG MAINTAIN SKIN INTEGRITY WITH ASSISTANCE STG Maintain Skin Integrity With min Assistance.  Outcome: Progressing  Problem: RH SAFETY Goal: RH STG ADHERE TO SAFETY PRECAUTIONS W/ASSISTANCE/DEVICE STG Adhere to Safety Precautions With min Assistance/Device.  Outcome: Progressing Goal: RH STG DECREASED RISK OF FALL WITH ASSISTANCE STG Decreased Risk of Fall With min Assistance.  Outcome: Progressing  Problem: RH PAIN MANAGEMENT Goal: RH STG PAIN MANAGED AT OR BELOW PT'S PAIN GOAL <2 on a 0-10 scale  Outcome: Progressing     

## 2013-12-20 NOTE — Progress Notes (Signed)
INITIAL NUTRITION ASSESSMENT  DOCUMENTATION CODES Per approved criteria  -Morbid Obesity   INTERVENTION: -D/c Ensure TID -Resource Breeze po TID, each supplement provides 250 kcal and 9 grams of protein  NUTRITION DIAGNOSIS: Inadequate oral intake related to altered taste perception as evidenced by PO: 25%.   Goal: Pt will meet >90% of estimated nutritional needs  Monitor:  PO/supplement intake, labs, weight changes, I/O's  Reason for Assessment: MST=2  28 y.o. female  Admitting Dx: <principal problem not specified>  Jill Summers was admitted today to Inpatient Rehabilitation with the diagnosis of b12 polyneuropathy.   ASSESSMENT: Pt confirms poor appetite and weight loss over the past 2 months. She reveals she has always been a "big girl" but noticed unintentional weight loss over the past 2 months, due to poor appetite. She reports UBW is 230#. Documented wt hx reveals a 11.3% wt loss over the past 3 months.  She reports she has a lack of desire to eat due to altered taste perception. She reveals "everything tastes disgusting". Noted very minimal intake of lunch tray- she reports all she could eat were the peaches, as that was all that was appealing to her. Noted order for Ensure TID. Pt reports she does not like supplement, however, is willing to try an alternative. She is agreeable to [employer] Breeze, as she accepts sweet flavored foods the best at this time. Pt understands the importance of good PO intake for healing process.  Nutrition focused physical exam reveals no signs of fat and muscle depletion.  Labs reviewed. WDL.   Height: Ht Readings from Last 1 Encounters:  12/19/13 5\' 2"  (1.575 m)    Weight: Wt Readings from Last 1 Encounters:  12/16/13 214 lb 4.6 oz (97.2 kg)    Ideal Body Weight: 110#  % Ideal Body Weight: 195%  Wt Readings from Last 10 Encounters:  12/16/13 214 lb 4.6 oz (97.2 kg)  12/17/13 220 lb (99.791 kg)  10/12/13 237 lb (107.502  kg)  09/20/13 240 lb (108.863 kg)  09/19/13 240 lb (108.863 kg)  07/05/13 236 lb (107.049 kg)  05/31/13 241 lb 12.8 oz (109.68 kg)  01/19/13 239 lb 6.4 oz (108.591 kg)  08/23/12 210 lb (95.255 kg)  05/22/12 227 lb 3.2 oz (103.057 kg)    Usual Body Weight: 230#  % Usual Body Weight: 93%  BMI:  Estimated body mass index is 40.23 kg/(m^2) as calculated from the following:   Height as of this encounter: 5\' 2"  (1.575 m).   Weight as of 12/16/13: 220 lb (99.791 kg). Extreme obesity, class III.   Estimated Nutritional Needs: Kcal: 1900-2100 Protein: 107-117 grams Fluid: 1.9-2.1 L  Skin: Intact  Diet Order: Diet regular  EDUCATION NEEDS: -Education needs addressed   Intake/Output Summary (Last 24 hours) at 12/20/13 1534 Last data filed at 12/20/13 1300  Gross per 24 hour  Intake    240 ml  Output      0 ml  Net    240 ml    Last BM: 12/16/13  Labs:   Recent Labs Lab 12/16/13 0110 12/17/13 0425 12/18/13 1522  NA 141 141 140  K 3.8 4.0 3.7  CL 103 106 105  CO2 22 22 22   BUN 14 7 6   CREATININE 0.89 0.79 0.71  CALCIUM 8.6 8.4 8.7  GLUCOSE 103* 101* 96    CBG (last 3)  No results for input(s): GLUCAP in the last 72 hours.  Scheduled Meds: . buPROPion  150 mg Oral Daily  .  ciprofloxacin  500 mg Oral BID  . feeding supplement (ENSURE COMPLETE)  237 mL Oral TID BM  . magic mouthwash w/lidocaine  10 mL Oral QID    Continuous Infusions:   Past Medical History  Diagnosis Date  . Asthma   . Obesity   . Adopted   . Depression   . Chlamydia   . Bacterial vaginosis   . Urinary tract infection   . MRSA infection     Past Surgical History  Procedure Laterality Date  . Cesarean section    . Cesarean section with bilateral tubal ligation      Jill Summers A. Mayford KnifeWilliams, RD, LDN Pager: [phone] After hours Pager: [phone]

## 2013-12-20 NOTE — Evaluation (Signed)
Physical Therapy Assessment and Plan  Patient Details  Name: Jill Summers MRN: [id number] Date of Birth: 08-10-1985  PT Diagnosis: Abnormality of gait, Coordination disorder, Difficulty walking, Muscle weakness and Paraplegia, Decreased balance, Decreased functional endurance Rehab Potential: Good ELOS: 14-16days   Today's Date: 12/20/2013 PT Individual Time: 1000-1100 PT Individual Time Calculation (min): 60 min    Problem List:  Patient Active Problem List   Diagnosis Date Noted  . Paraplegia 12/19/2013  . Polyneuropathy due to vitamin B deficiency 12/19/2013  . Vitamin B 12 deficiency 12/18/2013  . Leg weakness, bilateral   . Protein-calorie malnutrition, severe 12/17/2013  . Vertigo 12/16/2013  . Fever 12/16/2013  . Lower extremity weakness 12/16/2013  . Dizziness   . Pancytopenia 12/15/2013  . MRSA (methicillin resistant staph aureus) culture positive 07/27/2013    Past Medical History:  Past Medical History  Diagnosis Date  . Asthma   . Obesity   . Adopted   . Depression   . Chlamydia   . Bacterial vaginosis   . Urinary tract infection   . MRSA infection    Past Surgical History:  Past Surgical History  Procedure Laterality Date  . Cesarean section    . Cesarean section with bilateral tubal ligation      Assessment & Plan Clinical Impression: Jill Summers is a 28 y.o. right handed female with history of depression and bronchial asthma as well as recent cellulitis of the umbilicus and treated with Bactrim in September 2015 as well as recent MRSA PCR screen +2 months ago as well as previously untreated B-12 deficiency. Patient lives with children independent prior to admission and her mother has been assisting with care of the children recently. Admitted 12/16/2013 with bouts of dizziness over the past 2 weeks as well as lower strandy weakness and numbness. She denied any falls. No change in bowel or bladder. Cranial CT scan negative. MRI of thoracic  lumbar and cervical spine showed multiple bilateral thoracic cord lesions in the lateral white matter most compatible with demyelinating disease of question etiology and workup ongoing as per neurology services and felt cord lesions most likely manifestations of B-12 deficiency. Partial visualization of lower cervical cord shows white matter lesions with full cervical spine films pending. MRI of the brain 12/18/2013 showed less than 10 nonspecific subcentimeter supratentorial white matter T2 hyperintensities in a atypical distribution for classic demyelination. Found to have severe pancytopenia with WBC 1.4, ANC 0.4, hemoglobin 5.5, MCV 101.9, platelets 37,000. Hematology service consulted (Dr. Jana Hakim) She was transfused. Etiology still remains unclear and so far felt to be secondary to B-12 deficiency and currently maintained on supplementation with plan to follow-up with hematology services mostly for B-12 injections. Patient placed on Maxipime changed to Cipro for suspected UTI with urinalysis study showing negative nitrite and 3-6 WBCs and plans to complete a seven-day course of antibiotic. Physical therapy evaluation completed 12/18/2013 with recommendations of physical medicine rehabilitation consult. Patient was admitted for comprehensive rehabilitation program. Patient transferred to CIR on 12/19/2013.   Patient currently requires min with mobility secondary to muscle weakness, decreased cardiorespiratoy endurance, decreased coordination and decreased standing balance, decreased postural control, decreased balance strategies and paraplegia.  Prior to hospitalization, patient was independent  with mobility and lived with Alone in a House (duplex) home.  Home access is 2Stairs to enter.  Patient will benefit from skilled PT intervention to maximize safe functional mobility, minimize fall risk and decrease caregiver burden for planned discharge home with 24 hour supervision.  Anticipate patient will  benefit from follow up [place] at discharge.  PT - End of Session Activity Tolerance: Tolerates 10 - 20 min activity with multiple rests Endurance Deficit: Yes Endurance Deficit Description: ' (Pt with increased fatigue with functional tasks in standing and ambulation) PT Assessment Rehab Potential (ACUTE/IP ONLY): Good PT Patient demonstrates impairments in the following area(s): Balance;Endurance;Motor;Safety;Other (comment) (strength) PT Transfers Functional Problem(s): Bed Mobility;Bed to Chair;Furniture;Car;Floor PT Locomotion Functional Problem(s): Stairs;Ambulation;Wheelchair Mobility PT Plan PT Intensity: Minimum of 1-2 x/day ,45 to 90 minutes PT Frequency: 5 out of 7 days PT Duration Estimated Length of Stay: 14-16days PT Treatment/Interventions: Ambulation/gait training;Community reintegration;DME/adaptive equipment instruction;Neuromuscular re-education;Psychosocial support;Stair training;UE/LE Strength taining/ROM;Wheelchair propulsion/positioning;UE/LE Coordination activities;Therapeutic Activities;Skin care/wound management;Pain management;Discharge planning;Balance/vestibular training;Disease management/prevention;Functional mobility training;Patient/family education;Therapeutic Exercise;Visual/perceptual remediation/compensation PT Transfers Anticipated Outcome(s): Mod(I)-Supervision PT Locomotion Anticipated Outcome(s): Mod(I)-Supervision PT Recommendation Recommendations for Other Services: Neuropsych consult Follow Up Recommendations: Home health PT Patient destination: Home Equipment Recommended: To be determined  Skilled Therapeutic Intervention 1:1. Pt received supine in bed, ready for therapy. PT evaluation performed, see detailed objective information below. Pt educated on rehab environment, role of therapies, goals for physical therapy and general safety plan. Pt verbalized understanding. Tx initiated with emphasis on functional endurance, safety during functional  transfers, gait training, standing therex with RW to address B LE strength/endurance as well as education regarding impairments. Pt req intermittent seated rest throughout session due to fatigue. Pt left sitting in w/c at end of session w/ all needs in reach and quick release belt in place.   PT Evaluation Precautions/Restrictions Precautions Precautions: Fall Restrictions Weight Bearing Restrictions: No General Chart Reviewed: Yes Family/Caregiver Present: No Vital SignsTherapy Vitals Temp: 98.8 F (37.1 C) Temp Source: Oral Pulse Rate: 87 Resp: 18 BP: 118/82 mmHg Patient Position (if appropriate): Lying Oxygen Therapy SpO2: 100 % O2 Device: Not Delivered Pain Pain Assessment Pain Assessment: No/denies pain Home Living/Prior Functioning Home Living Available Help at Discharge: Friend(s) [employer]ters) Type of Home: House (duplex) Home Access: Stairs to enter [employer] of Steps: 2- pillar Entrance Stairs-Rails: None Home Layout: One level  Lives With: Alone Prior Function Level of Independence: Independent with basic ADLs;Independent with homemaking with ambulation;Independent with gait;Independent with transfers  Able to Take Stairs?: Yes Driving: Yes Leisure: Hobbies-yes (Comment) Comments: shopping, going out to eat, movies; has two little boys- 3 and 29yo Vision/Perception  See OT eval  Cognition Overall Cognitive Status: Within Functional Limits for tasks assessed Arousal/Alertness: Awake/alert Orientation Level: Oriented X4 Attention: Selective Selective Attention: Appears intact Memory: Appears intact Awareness: Appears intact Problem Solving: Appears intact Safety/Judgment: Appears intact Comments: Flat affect, slightly decreased frustration tolerance Sensation Sensation Light Touch: Appears Intact Proprioception: Impaired by gross assessment Coordination Gross Motor Movements are Fluid and Coordinated: No Fine Motor Movements are Fluid and  Coordinated: Yes Coordination and Movement Description: decreased coordination in B LE with fatigue, especially during standing mobility. Increased reliance on B UE with fatigue Heel Shin Test: decreased speed and accuracy B, L>R Motor  Motor Motor: Paraplegia Motor - Skilled Clinical Observations: decreased B LE strength and coordination  Mobility Bed Mobility Bed Mobility: Supine to Sit;Sit to Supine Supine to Sit: 5: Supervision Supine to Sit Details: Verbal cues for precautions/safety Sit to Supine: 5: Supervision Sit to Supine - Details: Verbal cues for precautions/safety Transfers Transfers: Yes Sit to Stand: 4: Min assist Sit to Stand Details: Verbal cues for safe use of DME/AE;Verbal cues for precautions/safety Stand to Sit: 4: Min assist Stand to Sit Details (indicate cue type  and reason): Verbal cues for precautions/safety;Verbal cues for safe use of DME/AE Stand to Sit Details: poor ecc. control Stand Pivot Transfers: 4: Min assist Stand Pivot Transfer Details: Verbal cues for precautions/safety;Verbal cues for safe use of DME/AE Stand Pivot Transfer Details (indicate cue type and reason): cues for proximity to RW, often pushing out of way before completion of turn Locomotion  Ambulation Ambulation: Yes Ambulation/Gait Assistance: 4: Min assist Ambulation Distance (Feet): 62 Feet Assistive device: Rolling walker Ambulation/Gait Assistance Details: Verbal cues for precautions/safety;Verbal cues for safe use of DME/AE;Verbal cues for gait pattern Gait Gait: Yes Gait Pattern: Impaired Gait Pattern: Decreased hip/knee flexion - right;Decreased hip/knee flexion - left;Decreased dorsiflexion - right;Decreased dorsiflexion - left;Decreased step length - left;Decreased step length - right;Step-through pattern;Narrow base of support;Wide base of support;Trunk flexed;Decreased stance time - right;Decreased stance time - left Stairs / Additional Locomotion Stairs: Yes Stairs  Assistance: 4: Min assist Stairs Assistance Details: Verbal cues for precautions/safety;Verbal cues for technique;Verbal cues for gait pattern Stair Management Technique: Two rails;Step to pattern;Forwards Number of Stairs: 5 Architect: Yes Wheelchair Assistance: 5: Investment banker, operational Details: Verbal cues for technique;Verbal cues for Information systems manager: Both upper extremities Wheelchair Parts Management: Needs assistance Distance: 100'  Trunk/Postural Assessment  Cervical Assessment Cervical Assessment: Within Functional Limits Thoracic Assessment Thoracic Assessment: Within Functional Limits Lumbar Assessment Lumbar Assessment: Within Functional Limits Postural Control Postural Control: Deficits on evaluation Righting Reactions: significantlly delayed in standing  Balance Balance Balance Assessed: Yes Static Sitting Balance Static Sitting - Balance Support: Feet supported;No upper extremity supported Static Sitting - Level of Assistance: 6: Modified independent (Device/Increase time) Dynamic Sitting Balance Dynamic Sitting - Balance Support: Feet supported;Right upper extremity supported;Left upper extremity supported Dynamic Sitting - Level of Assistance: 5: Stand by assistance Dynamic Sitting - Balance Activities: Lateral lean/weight shifting;Forward lean/weight shifting;Reaching for Consulting civil engineer Standing - Balance Support: Bilateral upper extremity supported Static Standing - Level of Assistance: 4: Min assist Dynamic Standing Balance Dynamic Standing - Balance Support: Right upper extremity supported;Left upper extremity supported Dynamic Standing - Level of Assistance: 4: Min assist;3: Mod assist Dynamic Standing - Balance Activities: Lateral lean/weight shifting;Forward lean/weight shifting;Reaching for objects Extremity Assessment  RUE Assessment RUE Assessment: Within  Functional Limits LUE Assessment LUE Assessment: Within Functional Limits RLE Assessment RLE Assessment: Exceptions to [facility] RLE Strength RLE Overall Strength: Deficits Right Hip Flexion: 3-/5 Right Knee Flexion: 3+/5 Right Knee Extension: 3+/5 Right Ankle Dorsiflexion: 3+/5 Right Ankle Plantar Flexion: 3+/5 LLE Assessment LLE Assessment: Exceptions to WFL LLE Strength LLE Overall Strength: Deficits Left Hip Flexion: 2+/5 Left Knee Flexion: 3+/5 Left Knee Extension: 3+/5 Left Ankle Dorsiflexion: 3+/5 Left Ankle Plantar Flexion: 3+/5  FIM:  FIM - Bed/Chair Transfer Bed/Chair Transfer Assistive Devices: Arm rests;Walker Bed/Chair Transfer: 5: Supine > Sit: Supervision (verbal cues/safety issues);5: Sit > Supine: Supervision (verbal cues/safety issues);4: Bed > Chair or W/C: Min A (steadying Pt. > 75%);4: Chair or W/C > Bed: Min A (steadying Pt. > 75%) FIM - Locomotion: Wheelchair Locomotion: Wheelchair: 2: Travels 50 - 149 ft with supervision, cueing or coaxing FIM - Locomotion: Ambulation Locomotion: Ambulation Assistive Devices: Administrator Ambulation/Gait Assistance: 4: Min assist Locomotion: Ambulation: 2: Travels 50 - 149 ft with minimal assistance (Pt.>75%) FIM - Locomotion: Stairs Locomotion: Scientist, physiological: Hand rail - 2 Locomotion: Stairs: 2: Up and Down 4 - 11 stairs with minimal assistance (Pt.>75%)   Refer to Care Plan for Long Term Goals  Recommendations for  other services: Neuropsych  Discharge Criteria: Patient will be discharged from PT if patient refuses treatment 3 consecutive times without medical reason, if treatment goals not met, if there is a change in medical status, if patient makes no progress towards goals or if patient is discharged from hospital.  The above assessment, treatment plan, treatment alternatives and goals were discussed and mutually agreed upon: by patient  Gilmore Laroche 12/20/2013, 5:32 PM

## 2013-12-20 NOTE — Progress Notes (Signed)
Physical Therapy Session Note  Patient Details  Name: Jill Summers MRN: [id number] Date of Birth: July 21, 1985  Today's Date: 12/20/2013 PT Individual Time: 1300-1400 PT Individual Time Calculation (min): 60 min   Short Term Goals: Week 1:  PT Short Term Goal 1 (Week 1): Pt to maintain dynamic standing balance without B UE support and min A PT Short Term Goal 2 (Week 1): Pt to perform SPT bed<>w/c with RW and supervison, 50% of time PT Short Term Goal 3 (Week 1): Pt to propel w/c 200' with B UE and supervision demonstrating fluid and consistent pace PT Short Term Goal 4 (Week 1): Pt to ambulate 100' with RW and superivsion, 50% of time PT Short Term Goal 5 (Week 1): Pt to negotiate up/down 2 steps with RW and min A  Skilled Therapeutic Interventions/Progress Updates:  1:1. Pt received sitting in w/c, ready for therapy. Focus this session on functional endurance, functional w/c propulsion and NMR. Pt req supervision for w/c propulsion 175'x2 with B UE, demonstrating improved pace and endurance this PM vs. AM, but continues to remain non-fluid due to fatigue. Pt req min A for all t/f sit<>stand and SPT with RW w/c, min cues for hand placement and proximity to RW.   Focus on NMR to target increased reliance on B LE, glute/quad control, and dynamic balance.  - Pt practiced multiple t/f sit<>half stand without B UE support, reaching with either UE for medicine bottles on table maintaining 5 second hold before grasping bottle. 1x with tx mat slightly elevated, 2x height equal to w/c seat - Opening/closing medicine bottles in tall kneeling to decreased reliance on B UE support, cues for increased hip extension with fatigue  Pt utilized NuStep to target B UE/LE strength, endurance and coordination, good tolerance to level 2, 5min x2 reps. Pt req min A for amb 10'x2 with RW w/c<>NuStep.  Pt req frequent seated rest throughout session due to fatigue. Pt req supervision for t/f sit>sup at end of  session. Pt left semi-reclined in bed w/ all needs in reach, bed alarm on.   Therapy Documentation Precautions:  Precautions Precautions: Fall Restrictions Weight Bearing Restrictions: No  See FIM for current functional status  Therapy/Group: Individual Therapy  Denzil HughesKing, Anusha Claus S 12/20/2013, 5:36 PM

## 2013-12-20 NOTE — Progress Notes (Signed)
Occupational Therapy Assessment and Plan  Patient Details  Name: Jill Summers MRN: [id number] Date of Birth: [birth year]  OT Diagnosis: paraplegia ( BLEs) and generalized weakness Rehab Potential:  Good for stated goals ELOS: 14-16 days   Today's Date: 12/20/2013 OT Individual Time: 6629-4765 OT Individual Time Calculation (min): 60 min     Problem List:  Patient Active Problem List   Diagnosis Date Noted  . Paraplegia 12/19/2013  . Polyneuropathy due to vitamin B deficiency 12/19/2013  . Vitamin B 12 deficiency 12/18/2013  . Leg weakness, bilateral   . Protein-calorie malnutrition, severe 12/17/2013  . Vertigo 12/16/2013  . Fever 12/16/2013  . Lower extremity weakness 12/16/2013  . Dizziness   . Pancytopenia 12/15/2013  . MRSA (methicillin resistant staph aureus) culture positive 07/27/2013    Past Medical History:  Past Medical History  Diagnosis Date  . Asthma   . Obesity   . Adopted   . Depression   . Chlamydia   . Bacterial vaginosis   . Urinary tract infection   . MRSA infection    Past Surgical History:  Past Surgical History  Procedure Laterality Date  . Cesarean section    . Cesarean section with bilateral tubal ligation      Assessment & Plan Clinical Impression: Patient is a 28 y.o. year old female with history of depression and bronchial asthma as well as recent cellulitis of the umbilicus and treated with Bactrim in September 2015 as well as recent MRSA PCR screen +2 months ago as well as previously untreated B-12 deficiency. Patient lives with children independent prior to admission and her mother has been assisting with care of the children recently. Admitted 12/16/2013 with bouts of dizziness over the past 2 weeks as well as lower strandy weakness and numbness. She denied any falls. No change in bowel or bladder. Cranial CT scan negative. MRI of thoracic lumbar and cervical spine showed multiple bilateral thoracic cord lesions in the lateral white  matter most compatible with demyelinating disease of question etiology and workup ongoing as per neurology services and felt cord lesions most likely manifestations of B-12 deficiency. Partial visualization of lower cervical cord shows white matter lesions with full cervical spine films pending. MRI of the brain 12/18/2013 showed less than 10 nonspecific subcentimeter supratentorial white matter T2 hyperintensities in a atypical distribution for classic demyelination. Found to have severe pancytopenia with WBC 1.4, ANC 0.4, hemoglobin 5.5, MCV 101.9, platelets 37,000. Hematology service consulted (Dr. Jana Hakim) She was transfused. Etiology still remains unclear and so far felt to be secondary to B-12 deficiency and currently maintained on supplementation with plan to follow-up with hematology services mostly for B-12 injections. Patient placed on Maxipime changed to Cipro for suspected UTI with urinalysis study showing negative nitrite and 3-6 WBCs and plans to complete a seven-day course of antibiotic.  Patient transferred to CIR on 12/19/2013 .    Patient currently requires min with basic self-care skills secondary to muscle weakness, decreased cardiorespiratoy endurance and decreased standing balance, decreased postural control and decreased balance strategies.  Prior to hospitalization, patient could complete ADLs and IADLs with independent .  Patient will benefit from skilled intervention to increase independence with basic self-care skills prior to discharge home with care partner.  Anticipate patient will require 24 hour supervision and follow up OT services .  OT - End of Session Activity Tolerance: Decreased this session Endurance Deficit: Yes Endurance Deficit Description: ' (Pt with increased fatigue with functional tasks in standing and ambulation) OT  Assessment Barriers to Discharge:  (none known) OT Patient demonstrates impairments in the following area(s):  Balance;Sensory;Endurance;Motor;Safety OT Basic ADL's Functional Problem(s): Grooming;Bathing;Dressing;Toileting OT Advanced ADL's Functional Problem(s): Simple Meal Preparation;Laundry OT Transfers Functional Problem(s): Toilet;Tub/Shower OT Additional Impairment(s): None OT Plan OT Intensity: Minimum of 1-2 x/day, 45 to 90 minutes OT Frequency: 5 out of 7 days OT Duration/Estimated Length of Stay: 14-16 days OT Treatment/Interventions: Balance/vestibular training;Discharge planning;Self Care/advanced ADL retraining;Therapeutic Activities;UE/LE Coordination activities;Functional mobility training;Patient/family education;Therapeutic Exercise;UE/LE Strength taining/ROM;Psychosocial support;Neuromuscular re-education;DME/adaptive equipment instruction;Community reintegration OT Self Feeding Anticipated Outcome(s): n/a OT Basic Self-Care Anticipated Outcome(s): supervision OT Toileting Anticipated Outcome(s): supervision OT Bathroom Transfers Anticipated Outcome(s): supervision OT Recommendation Recommendations for Other Services: Other (comment) (none at this time) Patient destination: Home Follow Up Recommendations: 24 hour supervision/assistance;Outpatient OT Equipment Recommended: Tub/shower bench Equipment Details: Pt owns no equipment at this time   Skilled Therapeutic Intervention Upon entering the room, pt supine in bed sleeping. Pt with no c/o pain this session but very flat affect throughout evaluation. Pt educated on OT purpose, POC, and goals this session. Supine> sit with supervision for ADLs seated on EOB. Seated balance with supervision as well. Clothing not available for evaluation. Pt required increased encouragement for standing and ambulating during functional tasks. Pt ambulated with use of RW and min A ~ 10 feet to sink with increased fatigue and needing to sit and rest to brush teeth. Pt ambulated back to bed in likewise matter with continued increased fatigue. Pt accepts  OT POC. Pt supine in bed with c/o nausea and call bell within reach. RN alerted of nausea.  OT Evaluation Precautions/Restrictions  Precautions Precautions: Fall Restrictions Weight Bearing Restrictions: No General Chart Reviewed: Yes Vital Signs Therapy Vitals Temp: 98.8 F (37.1 C) Temp Source: Oral Pulse Rate: 87 Resp: 18 BP: 118/82 mmHg Patient Position (if appropriate): Lying Oxygen Therapy SpO2: 100 % O2 Device: Not Delivered Pain Pain Assessment Pain Assessment: 0-10 Pain Score: 0-No pain Home Living/Prior Functioning Home Living Family/patient expects to be discharged to:: Private residence Living Arrangements: Children Available Help at Discharge: Friend(s) Type of Home: House (duplex) Home Access: Stairs to enter Technical brewer of Steps: 2 Entrance Stairs-Rails: None Home Layout: One level  Lives With: Alone IADL History Homemaking Responsibilities: Yes Meal Prep Responsibility: Primary Laundry Responsibility: Primary Cleaning Responsibility: Primary Shopping Responsibility: Primary Child Care Responsibility: Primary Current License: Yes Occupation: Unemployed Prior Function Level of Independence: Independent with basic ADLs, Independent with homemaking with ambulation, Independent with gait, Independent with transfers  Able to Take Stairs?: Yes Driving: Yes Vocation: Unemployed Leisure: Hobbies-yes (Comment) Comments: 2 children ages 69 and 20, shopping, going out to eat Vision/Perception  Vision- History Baseline Vision/History: No visual deficits Patient Visual Report: No change from baseline  Cognition Overall Cognitive Status: Within Functional Limits for tasks assessed Arousal/Alertness: Awake/alert Orientation Level: Oriented X4 Attention: Selective Selective Attention: Appears intact Memory: Appears intact Awareness: Appears intact Problem Solving: Appears intact Safety/Judgment: Appears intact Comments: Flat affect, slightly  decreased frustration tolerance Sensation Sensation Light Touch: Appears Intact Stereognosis: Not tested Hot/Cold: Impaired by gross assessment (B LEs) Proprioception: Impaired by gross assessment Coordination Gross Motor Movements are Fluid and Coordinated: No Fine Motor Movements are Fluid and Coordinated: Yes Coordination and Movement Description: increased fatigue with movement = decreased coordination with fatigue in LEs Motor  Motor Motor: Paraplegia Motor - Skilled Clinical Observations: decreased B LE strength and coordination Mobility  Bed Mobility Bed Mobility: Supine to Sit;Sit to Supine Supine to Sit: 5: Supervision Supine to  Sit Details: Verbal cues for precautions/safety Sit to Supine: 5: Supervision Sit to Supine - Details: Verbal cues for precautions/safety Transfers Sit to Stand: 4: Min assist  Trunk/Postural Assessment  Cervical Assessment Cervical Assessment: Within Functional Limits Thoracic Assessment Thoracic Assessment: Within Functional Limits Lumbar Assessment Lumbar Assessment: Within Functional Limits Postural Control Postural Control: Deficits on evaluation  Balance Min A balance with functional ambulation and while standing at sink. Pt fatigues easily. Extremity/Trunk Assessment RUE Assessment RUE Assessment: Within Functional Limits LUE Assessment LUE Assessment: Within Functional Limits  FIM:  FIM - Eating Eating Activity: 7: Complete independence:no helper FIM - Grooming Grooming Steps: Wash, rinse, dry face;Wash, rinse, dry hands;Oral care, brush teeth, clean dentures Grooming: 5: Set-up assist to obtain items FIM - Bathing Bathing Steps Patient Completed: Chest;Right Arm;Left Arm;Abdomen;Front perineal area;Buttocks;Right upper leg;Left upper leg Bathing: 4: Min-Patient completes 8-9 [id number] parts or 75+ percent FIM - Upper Body Dressing/Undressing Upper body dressing/undressing: 0: Wears gown/pajamas-no public clothing FIM - Lower  Body Dressing/Undressing Lower body dressing/undressing: 0: Wears gown/pajamas-no public clothing FIM - Toileting Toileting steps completed by patient: Adjust clothing prior to toileting;Performs perineal hygiene;Adjust clothing after toileting Toileting Assistive Devices: Grab bar or rail for support Toileting: 4: Steadying assist FIM - BControl and instrumentation engineerDevices: Arm rests;Walker Bed/Chair Transfer: 5: Sit > Supine: Supervision (verbal cues/safety issues);4: Bed > Chair or W/C: Min A (steadying Pt. > 75%);4: Chair or W/C > Bed: Min A (steadying Pt. > 75%) FIM - TRadio producerDevices: BRecruitment consultantTransfers: 4-To toilet/BSC: Min A (steadying Pt. > 75%);4-From toilet/BSC: Min A (steadying Pt. > 75%) FIM - Tub/Shower Transfers Tub/shower Transfers: 0-Activity did not occur or was simulated   Refer to Care Plan for Long Term Goals  Recommendations for other services: None  Discharge Criteria: Patient will be discharged from OT if patient refuses treatment 3 consecutive times without medical reason, if treatment goals not met, if there is a change in medical status, if patient makes no progress towards goals or if patient is discharged from hospital.  The above assessment, treatment plan, treatment alternatives and goals were discussed and mutually agreed upon: by patient  PPhineas Semen11/21/2015, 5:21 PM

## 2013-12-20 NOTE — Progress Notes (Signed)
Subjective: Patient has no complaints. She is eager to continue therapy.  Objective:BP 104/60 mmHg  Pulse 78  Temp(Src) 99.2 F (37.3 C) (Oral)  Resp 20  Ht 5\' 2"  (1.575 m)  SpO2 100%  LMP 12/13/2013 No acute distress. Chest clear to auscultation. Cardiac exam S1 and S2 are regular. Abdominal exam active bowel sounds, soft. Extremities trace edema pretibial.    Medical Problem List and Plan: 1. Functional deficits secondary to severe B12 deficiency with peripheral polyneuropathy. Pt generally debilitated as well 2. DVT Prophylaxis/Anticoagulation: SCDs. Monitor for any signs of DVT 3. Pain Management: Tylenol as needed 4. Mood/depression: Wellbutrin 150 mg daily, Xanax as needed. Provide emotional support 5. Neuropsych: This patient is capable of making decisions on her own behalf. 6. Skin/Wound Care: Routine skin checks 7. Fluids/Electrolytes/Nutrition: Strict I and os. Follow-up chemistries. Provide nutritional supplements as needed 8. Probable UTI. Urine cultures pending 12/18/2013. Continue Cipro and await cultures 9. History of bronchial asthma. Albuterol nebulizer as needed. 10. Pancytopenia. Follow-up labs and monitor closely for any clinical sequelae CBC:    Component Value Date/Time   WBC 1.7* 12/18/2013 1522   HGB 7.6* 12/18/2013 1522   HCT 22.0* 12/18/2013 1522   PLT 21* 12/18/2013 1522   MCV 95.7 12/18/2013 1522   NEUTROABS 0.3* 12/16/2013 0110   LYMPHSABS 0.7 12/16/2013 0110   MONOABS 0.1 12/16/2013 0110   EOSABS 0.0 12/16/2013 0110   BASOSABS 0.0 12/16/2013 0110

## 2013-12-21 ENCOUNTER — Inpatient Hospital Stay (HOSPITAL_COMMUNITY): Payer: Medicaid Other | Admitting: Occupational Therapy

## 2013-12-21 ENCOUNTER — Inpatient Hospital Stay (HOSPITAL_COMMUNITY): Payer: Medicaid Other

## 2013-12-21 DIAGNOSIS — E538 Deficiency of other specified B group vitamins: Principal | ICD-10-CM

## 2013-12-21 MED ORDER — CYANOCOBALAMIN 1000 MCG/ML IJ SOLN
1000.0000 ug | Freq: Once | INTRAMUSCULAR | Status: AC
  Administered 2013-12-21: 1000 ug via INTRAMUSCULAR
  Filled 2013-12-21: qty 1

## 2013-12-21 NOTE — Progress Notes (Signed)
Physical Therapy Session Note  Patient Details  Name: Jill ArthurCandace L Duve MRN: [id number] Date of Birth: [birth year]  Today's Date: 12/21/2013 PT Individual Time: 1000-1100 PT Individual Time Calculation (min): 60 min   Short Term Goals: Week 1:  PT Short Term Goal 1 (Week 1): Pt to maintain dynamic standing balance without B UE support and min A PT Short Term Goal 2 (Week 1): Pt to perform SPT bed<>w/c with RW and supervison, 50% of time PT Short Term Goal 3 (Week 1): Pt to propel w/c 200' with B UE and supervision demonstrating fluid and consistent pace PT Short Term Goal 4 (Week 1): Pt to ambulate 100' with RW and superivsion, 50% of time PT Short Term Goal 5 (Week 1): Pt to negotiate up/down 2 steps with RW and min A  Skilled Therapeutic Interventions/Progress Updates:    Pt frustrated at times by perceived lack of progress, requiring education on rehab plan and progress, but limited effect noted. Pt with improved performance in later gait trials in session with decreased LOB requiring assist noted. HS assist wrap with increased assist dues to pt being reliant on genu recurvatum for stance stability. Pt would continue to benefit from skilled PT services to increase functional mobility.  Therapy Documentation Precautions:  Precautions Precautions: Fall Restrictions Weight Bearing Restrictions: No Pain: Pain Assessment Pain Assessment: No/denies pain Mobility:  Pt perform transfers Min A with cues for safety and hand placemeny Locomotion : Ambulation Ambulation/Gait Assistance: 4: Min assist (75'x2) with cues for pacing, posture Wheelchair Mobility Distance: 100'  Other Treatments:  Pre-gait BLE advancement 3x10 with and without R HS assist wrap. Pt educated on rehab plan and rehab progress. Marching, heel raises, standing anterior weight shifts, LAQs, stepping to target 2x10. Unsupported standing 2'x5 with EC and EO. Facilitation of tonic core muscles performed in session.  Transfers x20 in session. RW Gait with theraband assist wrap Mod A with cues for sequencing and knee stability x50'  See FIM for current functional status  Therapy/Group: Individual Therapy  Christia ReadingKinney, Rocco Kerkhoff G 12/21/2013, 10:38 AM

## 2013-12-21 NOTE — Plan of Care (Signed)
Problem: RH SAFETY Goal: RH STG ADHERE TO SAFETY PRECAUTIONS W/ASSISTANCE/DEVICE STG Adhere to Safety Precautions With min Assistance/Device.  Outcome: Progressing     

## 2013-12-21 NOTE — Plan of Care (Signed)
Problem: RH BOWEL ELIMINATION Goal: RH STG MANAGE BOWEL WITH ASSISTANCE STG Manage Bowel with min Assistance.  Outcome: Progressing     

## 2013-12-21 NOTE — Plan of Care (Signed)
Problem: RH SAFETY Goal: RH STG DECREASED RISK OF FALL WITH ASSISTANCE STG Decreased Risk of Fall With min Assistance.  Outcome: Progressing     

## 2013-12-21 NOTE — Progress Notes (Signed)
Subjective: Patient has no complaints. She feels well. She feels that she is making progress.  Objective:BP 109/59 mmHg  Pulse 71  Temp(Src) 97.8 F (36.6 C) (Oral)  Resp 19  Ht 5\' 2"  (1.575 m)  SpO2 100%  LMP 12/13/2013  Overweight female in no acute distress. Chest clear to auscultation. Cardiac exam S1 and S2 are regular. Abdominal exam overall, active bowel sounds, soft. Extremities no edema.   Medical Problem List and Plan: 1. Functional deficits secondary to severe B12 deficiency with peripheral polyneuropathy. Pt generally debilitated as well 2. DVT Prophylaxis/Anticoagulation: SCDs. Monitor for any signs of DVT 3. Pain Management: Tylenol as needed 4. Mood/depression: Wellbutrin 150 mg daily, Xanax as needed. Provide emotional support 5. Neuropsych: This patient is capable of making decisions on her own behalf. 6. Skin/Wound Care: Routine skin checks 7. Fluids/Electrolytes/Nutrition: Strict I and os. Follow-up chemistries. Provide nutritional supplements as needed 8. Probable UTI. Urine culture with no growth. Discontinue Cipro 9. History of bronchial asthma. Albuterol nebulizer as needed. 10. Pancytopenia- due to B12 deficiency.Follow-up labs and monitor closely for any clinical sequelae CBC:    Component Value Date/Time   WBC 1.7* 12/18/2013 1522   HGB 7.6* 12/18/2013 1522   HCT 22.0* 12/18/2013 1522   PLT 21* 12/18/2013 1522   MCV 95.7 12/18/2013 1522   NEUTROABS 0.3* 12/16/2013 0110   LYMPHSABS 0.7 12/16/2013 0110   MONOABS 0.1 12/16/2013 0110   EOSABS 0.0 12/16/2013 0110   BASOSABS 0.0 12/16/2013 0110

## 2013-12-21 NOTE — Progress Notes (Signed)
Occupational Therapy Session Note  Patient Details  Name: Jill Summers MRN: [id number] Date of Birth: Aug 06, 1985  Today's Date: 12/21/2013 OT Individual Time: 1305-1405 OT Individual Time Calculation (min): 60 min    Short Term Goals: Week 1:  OT Short Term Goal 1 (Week 1): Pt will perform LB dressing with Min A in order to increase I in self care.  OT Short Term Goal 2 (Week 1): Pt will perform shower transfer with Min A in order to increase I in functional transfers. OT Short Term Goal 3 (Week 1): Pt will perform 15 minutes of functional activities without rest break in order to increase endurance for ADL and IADL tasks.  Skilled Therapeutic Interventions/Progress Updates:    Pt seen for 1:1 OT session with focus on functional mobility, standing balance, activity tolerance, and BUE strength. Pt received sitting in w/c agreeable to therapy at this time. Pt propelled self in w/c around unit and in lobby with multiple rest breaks d/t fatigue. Functional mobility around gift shop with RW at CGA-SBA. Pt ambulated approx 50 feet before requiring rest break. Engaged in functional mobility 10-12 feet in solarium at SBA level then required min assist initially for furniture transfer, progressing to supervision. Pt required cues for carryover of hand placement as she attempts to pull up on RW. Engaged in dynamic standing activities in kitchen with min-SBA for standing balance while reaching into overhead and low cabinets. Pt required mod cues for safety with RW and positioning of body. Educated on energy conservation techniques and home modifications. Pt propelled self back to room and transferred to bed at supervision level. Pt left supine in bed with all needs in reach.   Therapy Documentation Precautions:  Precautions Precautions: Fall Restrictions Weight Bearing Restrictions: No General:   Vital Signs:  Pain: Pain Assessment Pain Assessment: No/denies pain  See FIM for current  functional status  Therapy/Group: Individual Therapy  Daneil Danerkinson, Jill Summers 12/21/2013, 1:55 PM

## 2013-12-21 NOTE — Plan of Care (Signed)
Problem: RH BLADDER ELIMINATION Goal: RH STG MANAGE BLADDER WITH ASSISTANCE STG Manage Bladder With min Assistance  Outcome: Progressing     

## 2013-12-21 NOTE — Plan of Care (Signed)
Problem: RH SKIN INTEGRITY Goal: RH STG SKIN FREE OF INFECTION/BREAKDOWN Remain free from infection and breakdown while on rehab with min assist  Outcome: Progressing

## 2013-12-21 NOTE — Progress Notes (Signed)
Occupational Therapy Session Note  Patient Details  Name: Jill Summers MRN: [id number] Date of Birth: [birth year]  Today's Date: 12/21/2013 OT Individual Time: 0800-0900 OT Individual Time Calculation (min): 60 min    Short Term Goals: Week 1:  OT Short Term Goal 1 (Week 1): Pt will perform LB dressing with Min A in order to increase I in self care.  OT Short Term Goal 2 (Week 1): Pt will perform shower transfer with Min A in order to increase I in functional transfers. OT Short Term Goal 3 (Week 1): Pt will perform 15 minutes of functional activities without rest break in order to increase endurance for ADL and IADL tasks.  Skilled Therapeutic Interventions/Progress Updates:   Patient agreeable to OT.  Patient seen for ADL retraining and instruction shower level seated on transfer tub bench.  Patient transfers supine to EOB with supervision.  Transfers EOB to wheelchair via RW with CGA.  Patient gathers clothing wheelchair level and places next to sink for after shower.  Patient completes ADL shower level.  Patient reports she is "claustrophobic" and requests to sit on edge of transfer tub bench facing out of shower with curtain open.  Therapist instructed in safety with seated positioning and technique for turning into shower when completing standing task to wash peri area/buttocks.  Patient able to cross legs over opposite knees for washing feet.  Patient completes bathing with CGA balance while standing to wash buttocks/peri area.  Patient completes UB dressing with setup for bra; dons gown secondary to family bringing shirt today.  Patient completes LB dressing with CGA stand balance by sink with sink to steady patient in standing with unilateral upper extremity support.  Patient grooms seated at sink wheelchair level with setup for items.    Patient educated on postural control and energy conservation techniques for ADL retraining and activities in therapy gym.  Patient receptive to  education.  Patient self propels wheelchair from hospital room to therapy gym using BUEs with min rest breaks.  Educated on purpose of therapy for improved activity tolerance, standing tolerance, and standing balance.  Patient completes sit to/from stand x 2 trials with SBA, stand balance SBA, and stand tolerance 1 minute and 3 minutes while completing 1 handed task with unilateral upper extremity support on tabletop.    Patient transfers wheelchair to bed at end of session with CGA via RW.  Patient instructed in bed mobility without use of rail and HOB flat for discharge planning for home.  Patient transfers EOB to supine with min assist for LLE onto bed.  Patient reports bed at home at high height; educated patient to request mother to bring in measurement of height of bed for discharge planning and practice at correct height prior to discharge.  Therapy Documentation Precautions:  Precautions Precautions: Fall Restrictions Weight Bearing Restrictions: No Vital Signs: Therapy Vitals Temp: 97.8 F (36.6 C) Temp Source: Oral Pulse Rate: 71 Resp: 19 BP: (!) 109/59 mmHg Patient Position (if appropriate): Lying Oxygen Therapy SpO2: 100 % O2 Device: Not Delivered Pain:   Denies pain.  See FIM for current functional status  Therapy/Group: Individual Therapy  Eber HongBrown, Jill Summers 12/21/2013, 9:06 AM

## 2013-12-21 NOTE — Progress Notes (Signed)
Jill Summers   DOB:January 23, 1986   ZO#:[id number]   JWJ#:[id number]  Subjective: washing herself w RN help ("my first shower"); feels she is making progress ambulating. No family in room   Objective:  Filed Vitals:   12/21/13 0532  BP: 109/59  Pulse: 71  Temp: 97.8 F (36.6 C)  Resp: 19    There is no weight on file to calculate BMI.  Intake/Output Summary (Last 24 hours) at 12/21/13 0820 Last data filed at 12/20/13 1500  Gross per 24 hour  Intake    360 ml  Output      0 ml  Net    360 ml    . CBG (last 3)  No results for input(s): GLUCAP in the last 72 hours.   Labs:  Lab Results  Component Value Date   WBC 1.7* 12/18/2013   HGB 7.6* 12/18/2013   HCT 22.0* 12/18/2013   MCV 95.7 12/18/2013   PLT 21* 12/18/2013   NEUTROABS 0.3* 12/16/2013    @LASTCHEMISTRY @  Urine Studies No results for input(s): UHGB, CRYS in the last 72 hours.  Invalid input(s): UACOL, UAPR, USPG, UPH, UTP, UGL, UKET, UBIL, UNIT, UROB, ULEU, UEPI, UWBC, URBC, UBAC, CAST, [place], [place]  Basic Metabolic Panel:  Recent Labs Lab 12/15/13 2108 12/16/13 0110 12/17/13 0425 12/18/13 1522  NA 140 141 141 140  K 4.1 3.8 4.0 3.7  CL 101 103 106 105  CO2 24 22 22 22   GLUCOSE 98 103* 101* 96  BUN 14 14 7 6   CREATININE 0.95 0.89 0.79 0.71  CALCIUM 9.0 8.6 8.4 8.7   GFR Estimated Creatinine Clearance: 114.7 mL/min (by C-G formula based on Cr of 0.71). Liver Function Tests:  Recent Labs Lab 12/15/13 2108 12/16/13 0110  AST 67* 62*  ALT 31 27  ALKPHOS 25* 24*  BILITOT 3.2* 3.1*  PROT 7.5 6.8  ALBUMIN 4.0 3.6   No results for input(s): LIPASE, AMYLASE in the last 168 hours. No results for input(s): AMMONIA in the last 168 hours. Coagulation profile  Recent Labs Lab 12/15/13 2108  INR 1.20    CBC:  Recent Labs Lab 12/15/13 2108 12/16/13 0110 12/17/13 0425 12/18/13 1522  WBC 1.4* 1.1* 1.5* 1.7*  NEUTROABS 0.4* 0.3*  --   --   HGB 5.5* 5.0* 7.8* 7.6*  HCT 16.5* 14.6* 22.3*  22.0*  MCV 101.9* 102.1* 96.1 95.7  PLT 37* 30* 28* 21*   Cardiac Enzymes: No results for input(s): CKTOTAL, CKMB, CKMBINDEX, TROPONINI in the last 168 hours. BNP: Invalid input(s): POCBNP CBG: No results for input(s): GLUCAP in the last 168 hours. D-Dimer No results for input(s): DDIMER in the last 72 hours. Hgb A1c No results for input(s): HGBA1C in the last 72 hours. Lipid Profile No results for input(s): CHOL, HDL, LDLCALC, TRIG, CHOLHDL, LDLDIRECT in the last 72 hours. Thyroid function studies No results for input(s): TSH, T4TOTAL, T3FREE, THYROIDAB in the last 72 hours.  Invalid input(s): FREET3 Anemia work up No results for input(s): VITAMINB12, FOLATE, FERRITIN, TIBC, IRON, RETICCTPCT in the last 72 hours. Microbiology Recent Results (from the past 240 hour(s))  Blood culture (routine x 2)     Status: None (Preliminary result)   Collection Time: 12/15/13  9:08 PM  Result Value Ref Range Status   Specimen Description BLOOD LEFT ANTECUBITAL  Final   Special Requests BOTTLES DRAWN AEROBIC AND ANAEROBIC 5CC  Final   Culture  Setup Time   Final    12/16/2013 00:55 Performed at [facility]    Culture   Final           BLOOD CULTURE RECEIVED NO GROWTH TO DATE CULTURE WILL BE HELD FOR 5 DAYS BEFORE ISSUING A FINAL NEGATIVE REPORT [facility]    Report Status PENDING  Incomplete  Blood culture (routine x 2)     Status: None (Preliminary result)   Collection Time: 12/15/13  9:08 PM  Result Value Ref Range Status   Specimen Description BLOOD RIGHT ANTECUBITAL  Final   Special Requests BOTTLES DRAWN AEROBIC AND ANAEROBIC 3CC  Final   Culture  Setup Time   Final    12/16/2013 00:55 Performed at [facility]    Culture   Final           BLOOD CULTURE RECEIVED NO GROWTH TO DATE CULTURE WILL BE HELD FOR 5 DAYS BEFORE ISSUING A FINAL NEGATIVE REPORT Performed at [facility]    Report Status PENDING  Incomplete  MRSA PCR  Screening     Status: None   Collection Time: 12/16/13  1:35 AM  Result Value Ref Range Status   MRSA by PCR NEGATIVE NEGATIVE Final    Comment:        The GeneXpert MRSA Assay (FDA approved for NASAL specimens only), is one component of a comprehensive MRSA colonization surveillance program. It is not intended to diagnose MRSA infection nor to guide or monitor treatment for MRSA infections.   Culture, Urine     Status: None   Collection Time: 12/18/13  2:55 AM  Result Value Ref Range Status   Specimen Description URINE, CLEAN CATCH  Final   Special Requests NONE  Final   Culture  Setup Time   Final    12/18/2013 12:40 Performed at [facility]    Colony Count NO GROWTH Performed at [facility]   Final   Culture NO GROWTH Performed at [facility]   Final   Report Status 12/19/2013 FINAL  Final    Results for Jill Summers, [name] (MRN [id number]) as of 12/21/2013 08:21  Ref. Range 06/24/2011 18:58 12/15/2013 21:08  Vitamin B-12 Latest Range: 211-911 pg/mL 50 (L) 104 (L)    Studies:  Ct Head Wo Contrast  12/16/2013   CLINICAL DATA:  Dizziness and weakness of both lower extremities.  EXAM: CT HEAD WITHOUT CONTRAST  TECHNIQUE: Contiguous axial images were obtained from the base of the skull through the vertex without intravenous contrast.  COMPARISON:  None.  FINDINGS: Ventricles and sulci appear symmetrical. No mass effect or midline shift. No abnormal extra-axial fluid collections. Gray-white matter junctions are distinct. Basal cisterns are not effaced. No evidence of acute intracranial hemorrhage. No depressed skull fractures. Visualized paranasal sinuses and mastoid air cells are not opacified.  IMPRESSION: Normal   Electronically Signed   By: Burman NievesWilliam  Stevens M.D.   On: 12/16/2013 01:29   Mr Laqueta JeanBrain W WGWo Contrast  12/18/2013   CLINICAL DATA:  Acute onset lower extremity numbness and weakness, unable to walk. Untreated vitamin B12 deficiency. Anemia.   EXAM: MRI HEAD WITHOUT AND WITH CONTRAST  MRI CERVICAL SPINE WITHOUT AND WITH CONTRAST  TECHNIQUE: Multiplanar, multiecho pulse sequences of the brain and surrounding structures, and cervical spine, to include the craniocervical junction and cervicothoracic junction, were obtained without and with intravenous contrast.  CONTRAST:  20mL MULTIHANCE GADOBENATE DIMEGLUMINE 529 MG/ML IV SOLN  COMPARISON:  CT of the head December 16, 2013 and MRI of the thoracic spine December 17, 2013  FINDINGS: MRI HEAD FINDINGS  Mild motion degraded examination.  No reduced diffusion to suggest acute ischemia nor hyperacute demyelination. No susceptibility artifact to suggest hemorrhage. Less than 10 supratentorial sub cm white matter FLAIR T2 hyperintensities, predominately in a subcortical white matter distribution. Scattered perivascular spaces. No mass lesions, mass effect nor abnormal parenchymal enhancement.  No abnormal extra-axial fluid collections. No abnormal a extra-axial enhancement. No extra-axial masses. Normal major intracranial vascular flow voids seen at the skull base.  Trace paranasal sinus mucosal thickening without air-fluid levels. Ocular globes and orbital contents are unremarkable though not tailored for evaluation. Mastoid air cells appear well-aerated. Concave superior margin of the pituitary could reflect arachnoid cyst or volume loss.  MRI CERVICAL SPINE FINDINGS  Cervical vertebral bodies and posterior elements are intact and aligned with maintenance of cervical lordosis. Intervertebral discs demonstrate normal morphology and signal characteristics. No abnormal bone marrow signal. No abnormal osseous or intradiscal enhancement.  Cervical spinal cord appears normal in morphology. The axial T2 sequences are moderately motion degraded, which limits sensitivity for cord signal abnormality, however on the sagittal T2 there is linear T2 bright signal within the dorsum of the spinal cord from T2 through T3, and  towards the LEFT at T5 through T7. No syrinx. No suspicious cord, leptomeningeal or epidural enhancement. Included prevertebral and paraspinal soft tissues are unremarkable.  Level by level evaluation demonstrates no significant disc bulge, canal stenosis or neural foraminal narrowing.  IMPRESSION: MRI HEAD: Less than 10 nonspecific subcentimeter supratentorial white matter T2 hyperintensities, in a atypical distribution for classic demyelination. No abnormal parenchymal enhancement or parenchymal brain volume loss.  MRI CERVICAL SPINE: Abnormal nonenhancing signal within spinal cord, limited assessment on the axial sequences due to motion, findings compatible with dorsal column pathology which can be seen with vitamin B12 deficiency, viral entities. No myelomalacia.  No nerve compressive changes.   Electronically Signed   By: Awilda Metro   On: 12/18/2013 22:42   Mr Cervical Spine W Wo Contrast  12/18/2013   CLINICAL DATA:  Acute onset lower extremity numbness and weakness, unable to walk. Untreated vitamin B12 deficiency. Anemia.  EXAM: MRI HEAD WITHOUT AND WITH CONTRAST  MRI CERVICAL SPINE WITHOUT AND WITH CONTRAST  TECHNIQUE: Multiplanar, multiecho pulse sequences of the brain and surrounding structures, and cervical spine, to include the craniocervical junction and cervicothoracic junction, were obtained without and with intravenous contrast.  CONTRAST:  20mL MULTIHANCE GADOBENATE DIMEGLUMINE 529 MG/ML IV SOLN  COMPARISON:  CT of the head December 16, 2013 and MRI of the thoracic spine December 17, 2013  FINDINGS: MRI HEAD FINDINGS  Mild motion degraded examination.  No reduced diffusion to suggest acute ischemia nor hyperacute demyelination. No susceptibility artifact to suggest hemorrhage. Less than 10 supratentorial sub cm white matter FLAIR T2 hyperintensities, predominately in a subcortical white matter distribution. Scattered perivascular spaces. No mass lesions, mass effect nor abnormal  parenchymal enhancement.  No abnormal extra-axial fluid collections. No abnormal a extra-axial enhancement. No extra-axial masses. Normal major intracranial vascular flow voids seen at the skull base.  Trace paranasal sinus mucosal thickening without air-fluid levels. Ocular globes and orbital contents are unremarkable though not tailored for evaluation. Mastoid air cells appear well-aerated. Concave superior margin of the pituitary could reflect arachnoid cyst or volume loss.  MRI CERVICAL SPINE FINDINGS  Cervical vertebral bodies and posterior elements are intact and aligned with maintenance of cervical lordosis. Intervertebral discs demonstrate normal morphology and signal characteristics. No abnormal bone marrow signal. No  abnormal osseous or intradiscal enhancement.  Cervical spinal cord appears normal in morphology. The axial T2 sequences are moderately motion degraded, which limits sensitivity for cord signal abnormality, however on the sagittal T2 there is linear T2 bright signal within the dorsum of the spinal cord from T2 through T3, and towards the LEFT at T5 through T7. No syrinx. No suspicious cord, leptomeningeal or epidural enhancement. Included prevertebral and paraspinal soft tissues are unremarkable.  Level by level evaluation demonstrates no significant disc bulge, canal stenosis or neural foraminal narrowing.  IMPRESSION: MRI HEAD: Less than 10 nonspecific subcentimeter supratentorial white matter T2 hyperintensities, in a atypical distribution for classic demyelination. No abnormal parenchymal enhancement or parenchymal brain volume loss.  MRI CERVICAL SPINE: Abnormal nonenhancing signal within spinal cord, limited assessment on the axial sequences due to motion, findings compatible with dorsal column pathology which can be seen with vitamin B12 deficiency, viral entities. No myelomalacia.  No nerve compressive changes.   Electronically Signed   By: Awilda Metro   On: 12/18/2013 22:42    Mr Thoracic Spine W Wo Contrast  12/17/2013   CLINICAL DATA:  Bilateral leg weakness. Mid and low back pain. Initial encounter.  EXAM: MRI THORACIC AND LUMBAR SPINE WITHOUT AND WITH CONTRAST  TECHNIQUE: Multiplanar and multiecho pulse sequences of the thoracic and lumbar spine were obtained without and with intravenous contrast.  CONTRAST:  20mL MULTIHANCE GADOBENATE DIMEGLUMINE 529 MG/ML IV SOLN  COMPARISON:  None.  FINDINGS: MR THORACIC SPINE FINDINGS  Segmentation: Counting was performed from the craniocervical junction. Segmentation is anatomic with 7 cervical, 12 thoracic and 5 lumbar type vertebral bodies.  Alignment: Anatomic.  Vertebrae: Normal.  Active red marrow throughout the spine.  Cord: There are small peripheral cord lesions in the lateral aspect of the cord bilaterally throughout the thoracic spine. The show increased T2 signal and are only well appreciated on the axial images due to partial volume averaging on the sagittal images. When correlating axial and sagittal images, they be can be seen on the inversion recovery images. Most prominent lesion is at the T5-T6 level on the LEFT. None of these lesions show post gadolinium enhancement to suggest an active demyelinating disease these are confined to white matter tracks without involvement of the gray matter. These are also visualized in the lower cervical cord at C7-T1.  Paraspinal tissues: Normal.  Disc levels:  Normal thoracic intervertebral discs.  No stenosis.  MR LUMBAR SPINE FINDINGS  Alignment:  Anatomic.  Vertebrae:  Normal.  Conus medullaris: Normal at L1-L2.  Paraspinal tissues: Normal.  Disc levels:  Normal.  No disc herniations or degenerative disease.  After contrast administration, there is no abnormal enhancement in the lumbar spine.  IMPRESSION: 1. Multiple bilateral thoracic cord lesions in the lateral white matter, most compatible with prior demyelinating disease. No active demyelinating disease in the thoracic spine. This  could be seen with multiple sclerosis, NMO, vasculitis, neurosarcoidosis, or ADEM among other causes. In this age group and with this pattern, multiple sclerosis is most likely. 2. Partial visualization of the lower cervical cord shows white matter lesions an cervical spine MRI with and without contrast is recommended to complete the assessment. An MRI of the brain should also be considered to assess for involvement. 3. Normal MRI of the lumbar spine.   Electronically Signed   By: Andreas Newport M.D.   On: 12/17/2013 16:00   Mr Lumbar Spine W Wo Contrast  12/17/2013   CLINICAL DATA:  Bilateral leg weakness. Mid  and low back pain. Initial encounter.  EXAM: MRI THORACIC AND LUMBAR SPINE WITHOUT AND WITH CONTRAST  TECHNIQUE: Multiplanar and multiecho pulse sequences of the thoracic and lumbar spine were obtained without and with intravenous contrast.  CONTRAST:  20mL MULTIHANCE GADOBENATE DIMEGLUMINE 529 MG/ML IV SOLN  COMPARISON:  None.  FINDINGS: MR THORACIC SPINE FINDINGS  Segmentation: Counting was performed from the craniocervical junction. Segmentation is anatomic with 7 cervical, 12 thoracic and 5 lumbar type vertebral bodies.  Alignment: Anatomic.  Vertebrae: Normal.  Active red marrow throughout the spine.  Cord: There are small peripheral cord lesions in the lateral aspect of the cord bilaterally throughout the thoracic spine. The show increased T2 signal and are only well appreciated on the axial images due to partial volume averaging on the sagittal images. When correlating axial and sagittal images, they be can be seen on the inversion recovery images. Most prominent lesion is at the T5-T6 level on the LEFT. None of these lesions show post gadolinium enhancement to suggest an active demyelinating disease these are confined to white matter tracks without involvement of the gray matter. These are also visualized in the lower cervical cord at C7-T1.  Paraspinal tissues: Normal.  Disc levels:  Normal  thoracic intervertebral discs.  No stenosis.  MR LUMBAR SPINE FINDINGS  Alignment:  Anatomic.  Vertebrae:  Normal.  Conus medullaris: Normal at L1-L2.  Paraspinal tissues: Normal.  Disc levels:  Normal.  No disc herniations or degenerative disease.  After contrast administration, there is no abnormal enhancement in the lumbar spine.  IMPRESSION: 1. Multiple bilateral thoracic cord lesions in the lateral white matter, most compatible with prior demyelinating disease. No active demyelinating disease in the thoracic spine. This could be seen with multiple sclerosis, NMO, vasculitis, neurosarcoidosis, or ADEM among other causes. In this age group and with this pattern, multiple sclerosis is most likely. 2. Partial visualization of the lower cervical cord shows white matter lesions an cervical spine MRI with and without contrast is recommended to complete the assessment. An MRI of the brain should also be considered to assess for involvement. 3. Normal MRI of the lumbar spine.   Electronically Signed   By: Andreas NewportGeoffrey  Lamke M.D.   On: 12/17/2013 16:00     Assessment/ Plan: 28 y.o. [place] woman presenting 11/15/2013 with inability to walk, fever and pancytopenia, found to be severely B-12 deficient  (1) B-12 deficiency: will need lifelong replacement; as patient does not have PCP this can be done through our office; patient would like to give herself shots, which is very reasonable  (a) replace weekly x 3, then monthly indefinitely  (b) teach patient proper self-administration procedure  (c) follow labs to resolution of pancytopenia  (2) subacute combined degeneration:   (a) neurologic evaluation in process  (b) physical therapy   Will follow peripherally and arrange for outpatient follow-up of B-12 deficiency. Please let me know if I can be of further help.   Lowella DellMAGRINAT,Lanika Colgate C, MD 12/21/2013  8:20 AM

## 2013-12-22 ENCOUNTER — Encounter (HOSPITAL_COMMUNITY): Payer: Medicaid Other | Admitting: Occupational Therapy

## 2013-12-22 ENCOUNTER — Inpatient Hospital Stay (HOSPITAL_COMMUNITY): Payer: Medicaid Other | Admitting: Occupational Therapy

## 2013-12-22 ENCOUNTER — Inpatient Hospital Stay (HOSPITAL_COMMUNITY): Payer: Medicaid Other

## 2013-12-22 ENCOUNTER — Inpatient Hospital Stay (HOSPITAL_COMMUNITY): Payer: Medicaid Other | Admitting: Physical Therapy

## 2013-12-22 LAB — CULTURE, BLOOD (ROUTINE X 2)
Culture: NO GROWTH
Culture: NO GROWTH

## 2013-12-22 LAB — CBC WITH DIFFERENTIAL/PLATELET
Basophils Absolute: 0 10*3/uL (ref 0.0–0.1)
Basophils Relative: 2 % — ABNORMAL HIGH (ref 0–1)
Eosinophils Absolute: 0 10*3/uL (ref 0.0–0.7)
Eosinophils Relative: 2 % (ref 0–5)
HCT: 26.1 % — ABNORMAL LOW (ref 36.0–46.0)
Hemoglobin: 8.4 g/dL — ABNORMAL LOW (ref 12.0–15.0)
Lymphocytes Relative: 63 % — ABNORMAL HIGH (ref 12–46)
Lymphs Abs: 1.3 10*3/uL (ref 0.7–4.0)
MCH: 32.1 pg (ref 26.0–34.0)
MCHC: 32.2 g/dL (ref 30.0–36.0)
MCV: 99.6 fL (ref 78.0–100.0)
Monocytes Absolute: 0.3 10*3/uL (ref 0.1–1.0)
Monocytes Relative: 17 % — ABNORMAL HIGH (ref 3–12)
Neutro Abs: 0.3 10*3/uL — ABNORMAL LOW (ref 1.7–7.7)
Neutrophils Relative %: 16 % — ABNORMAL LOW (ref 43–77)
Platelets: 54 10*3/uL — ABNORMAL LOW (ref 150–400)
RBC: 2.62 MIL/uL — ABNORMAL LOW (ref 3.87–5.11)
RDW: 21.8 % — ABNORMAL HIGH (ref 11.5–15.5)
WBC: 1.9 10*3/uL — ABNORMAL LOW (ref 4.0–10.5)

## 2013-12-22 LAB — COMPREHENSIVE METABOLIC PANEL
ALT: 26 U/L (ref 0–35)
AST: 37 U/L (ref 0–37)
Albumin: 3.4 g/dL — ABNORMAL LOW (ref 3.5–5.2)
Alkaline Phosphatase: 27 U/L — ABNORMAL LOW (ref 39–117)
Anion gap: 16 — ABNORMAL HIGH (ref 5–15)
BUN: 5 mg/dL — ABNORMAL LOW (ref 6–23)
CO2: 21 mEq/L (ref 19–32)
Calcium: 8.9 mg/dL (ref 8.4–10.5)
Chloride: 106 mEq/L (ref 96–112)
Creatinine, Ser: 0.61 mg/dL (ref 0.50–1.10)
GFR calc Af Amer: >90 mL/min (ref >90)
GFR calc non Af Amer: >90 mL/min (ref >90)
Glucose, Bld: 98 mg/dL (ref 70–99)
Potassium: 3.5 mEq/L — ABNORMAL LOW (ref 3.7–5.3)
Sodium: 143 mEq/L (ref 137–147)
Total Bilirubin: 1.7 mg/dL — ABNORMAL HIGH (ref 0.3–1.2)
Total Protein: 6.8 g/dL (ref 6.0–8.3)

## 2013-12-22 LAB — PATHOLOGIST SMEAR REVIEW

## 2013-12-22 MED ORDER — POTASSIUM CHLORIDE CRYS ER 20 MEQ PO TBCR
20.0000 meq | EXTENDED_RELEASE_TABLET | Freq: Every day | ORAL | Status: AC
  Administered 2013-12-22 – 2013-12-23 (×2): 20 meq via ORAL
  Filled 2013-12-22: qty 1

## 2013-12-22 NOTE — Progress Notes (Signed)
PMR Admission Coordinator Pre-Admission Assessment  Patient: Jill Summers is an 28 y.o., female MRN: [id number] DOB: [birth year] Height: 5\' 2"  (157.5 cm) Weight: 97.2 kg (214 lb 4.6 oz)  Insurance Information HMO: No PPO: PCP: IPA: 80/20: OTHER:  PRIMARY: Medicaid [employer] access Policy#: [id number] n Subscriber: Christiana Fuchsandace Regan CM Name: Phone#: Fax#:  Pre-Cert#: Employer: Not employed, disabled from asthma Benefits: Phone #: [phone] Name: Automated Eff. Date: Eligible 12/19/13 Deduct: Out of Pocket Max: Life Max:  CIR: SNF:  Outpatient: Co-Pay:  Home Health: Co-Pay:  DME: Co-Pay:  Providers:   Emergency Contact Information Contact Information    Name Relation Home Work [employer]   Willers,Marilyn Mother [phone]  [phone]   Lowry Ramdwards,Cherie Sister   [phone]     Current Medical History  Patient Admitting Diagnosis: Vit B12 deficiency, Paraplegia secondary to spinal cord lesion   History of Present Illness: A 28 y.o. right handed female with history of depression and bronchial asthma as well as recent cellulitis of the umbilicus and treated with Bactrim in September 2015 as well as recent MRSA PCR screen +2 months ago as well as previously untreated B-12 deficiency. Patient lives with children independent prior to admission and her mother has been assisting with care of the children recently. Admitted 12/16/2013 with bouts of dizziness over the past 2 weeks as well as lower strandy weakness and numbness. She denied any falls. No change in bowel or bladder. Cranial CT scan negative. MRI of thoracic lumbar and cervical spine showed multiple bilateral thoracic cord lesions in the lateral white  matter most compatible with demyelinating disease of question etiology and workup ongoing as per neurology services and felt cord lesions most likely manifestations of B-12 deficiency. Partial visualization of lower cervical cord shows white matter lesions with full cervical spine films pending. MRI of the brain 12/18/2013 showed less than 10 nonspecific subcentimeter supratentorial white matter T2 hyperintensities in a atypical distribution for classic demyelination. Found to have severe pancytopenia with WBC 1.4, ANC 0.4, hemoglobin 5.5, MCV 101.9, platelets 37,000. Hematology service consulted (Dr. Darnelle CatalanMagrinat) She was transfused. Etiology still remains unclear and so far felt to be secondary to B-12 deficiency and currently maintained on supplementation with plan to follow-up with hematology services mostly for B-12 injections. Patient placed on Maxipime changed to Cipro for suspected UTI with urinalysis study showing negative nitrite and 3-6 WBCs and plans to complete a seven-day course of antibiotic. Physical therapy evaluation completed 12/18/2013 with recommendations of physical medicine rehabilitation consult. Patient to be admitted for comprehensive inpatient rehabilitation program.   Past Medical History  Past Medical History  Diagnosis Date  . Asthma   . Obesity   . Adopted   . Depression   . Chlamydia   . Bacterial vaginosis   . Urinary tract infection   . MRSA infection     Family History  family history includes Diabetes in her father; Hypertension in her mother. She was adopted.  Prior Rehab/Hospitalizations: None  Current Medications  Current facility-administered medications: acetaminophen (TYLENOL) tablet 650 mg, 650 mg, Oral, Q6H PRN, 650 mg at 12/19/13 0601 **OR** acetaminophen (TYLENOL) suppository 650 mg, 650 mg, Rectal, Q6H PRN, Eduard ClosArshad N Kakrakandy, MD; albuterol (PROVENTIL) (2.5 MG/3ML) 0.083% nebulizer solution 2.5 mg, 2.5 mg,  Inhalation, Q6H PRN, Eduard ClosArshad N Kakrakandy, MD buPROPion (WELLBUTRIN XL) 24 hr tablet 150 mg, 150 mg, Oral, Daily, Eduard ClosArshad N Kakrakandy, MD, 150 mg at 12/19/13 1036; ceFEPIme (MAXIPIME) 2 g in dextrose 5 % 50 mL IVPB, 2 g, Intravenous, Q8H, Leann Trefz  Poindexter, RPH, 2 g at 12/19/13 1038; cyanocobalamin ((VITAMIN B-12)) injection 1,000 mcg, 1,000 mcg, Intramuscular, Once, Lowella Dell, MD feeding supplement (ENSURE COMPLETE) (ENSURE COMPLETE) liquid 237 mL, 237 mL, Oral, TID BM, Tilda Franco, RD, 237 mL at 12/16/13 1600; LORazepam (ATIVAN) injection 1 mg, 1 mg, Intravenous, BID PRN, Dorothea Ogle, MD, 1 mg at 12/18/13 2006; magic mouthwash w/lidocaine, 5 mL, Oral, TID, Dorothea Ogle, MD ondansetron [facility]) tablet 4 mg, 4 mg, Oral, Q6H PRN **OR** ondansetron (ZOFRAN) injection 4 mg, 4 mg, Intravenous, Q6H PRN, Eduard Clos, MD; sodium chloride 0.9 % 1,000 mL with potassium chloride 10 mEq infusion, , Intravenous, Continuous, Lowella Dell, MD, Last Rate: 75 mL/hr at 12/16/13 2328  Patients Current Diet: Diet regular Diet - low sodium heart healthy  Precautions / Restrictions Precautions Precautions: Fall Restrictions Weight Bearing Restrictions: No   Prior Activity Level Community (5-7x/wk): Went out daily, was not working.   Home Assistive Devices / Equipment Home Assistive Devices/Equipment: Eyeglasses Home Equipment: None  Prior Functional Level Prior Function Level of Independence: Independent Comments: has 2 children, ages 69 and 8  Current Functional Level Cognition  Overall Cognitive Status: Within Functional Limits for tasks assessed Orientation Level: Oriented X4   Extremity Assessment (includes Sensation/Coordination)  Lower Extremity Assessment: RLE deficits/detail;LLE deficits/detail RLE Deficits / Details: AAROM WFL, strength hip flexion 3+/5, knee extension 4-/5, ankle DF 3+/5 LLE Deficits / Details: AAROM WFL, strength hip flexion 3-/5, knee  extension 4/5, ankle DF 3+/5    ADLs  Overall ADL's : Needs assistance/impaired Eating/Feeding: Independent, Sitting Grooming: Wash/dry hands, Wash/dry face, Set up, Sitting Upper Body Bathing: Set up, Sitting Lower Body Bathing: Moderate assistance, Sit to/from stand Upper Body Dressing : Set up, Sitting Lower Body Dressing: Moderate assistance, Sit to/from stand Toilet Transfer: Minimal assistance, Stand-pivot, BSC, RW Toileting- Clothing Manipulation and Hygiene: Minimal assistance, Sit to/from stand Functional mobility during ADLs: Minimal assistance, Rolling walker General ADL Comments: Patient agreeable to OT evaluation. She is still experiencing RLE weakness which is impacting ADL performance. She is able to perform UB simulated ADLs with setup seated EOB, and demonstrated ability to don/doff both socks from seated position with increased time. She does having difficulty with standing ADLs, requiring mod A for balance when trying to perform. She has been using BSC for toileting and requires min A overall for this task.    Mobility  Overal bed mobility: Needs Assistance Bed Mobility: Supine to Sit Supine to sit: Supervision General bed mobility comments: repots has gotten up to [facility] on her own (advised for safety to call for help)    Transfers  Overall transfer level: Needs assistance Equipment used: Rolling walker (2 wheeled) Transfers: Sit to/from Stand, Stand Pivot Transfers Sit to Stand: Min assist Stand pivot transfers: Min assist General transfer comment: uses UE's on walker or bed and relies on momentum strategy    Ambulation / Gait / Stairs / Wheelchair Mobility  Ambulation/Gait Ambulation/Gait assistance: Mod assist Ambulation Distance (Feet): 7 Feet (and 3') Assistive device: Rolling walker (2 wheeled) Gait Pattern/deviations: Step-through pattern, Trunk flexed, Shuffle, Wide base of support General Gait Details: assist with walker with turns and backing  up due to heavy UE reliance    Posture / Balance Overall balance assessment: Needs assistance Sitting balance-Leahy Scale: Good Standing balance-Leahy Scale: Poor Standing balance comment: heavily relies on UE support for standing   Special needs/care consideration BiPAP/CPAP No CPM No Continuous Drip IV 0.9% NS with KCL 10 meq/L at  75 ml/hr Dialysis No  Life Vest No Oxygen No Special Bed No Trach Size No Wound Vac (area) No  Skin Had a boil on thigh, but it is healed now.  Bowel mgmt: Last BM 12/16/13 Bladder mgmt: [facility] 2 incontinent episodes per patient Diabetic mgmt No, but patient feels like she should be tested.    Previous Home Environment Living Arrangements: Children Available Help at Discharge: (pt trying to find someone who can stay with her at d/c) Type of Home: House (duplex) Home Layout: One level Home Access: Stairs to enter Entrance Stairs-Rails: Left Entrance Stairs-Number of Steps: 2 Bathroom Shower/Tub: Tub/shower unit, Buyer, retailCurtain Bathroom Toilet: Standard Bathroom Accessibility: Yes How Accessible: Accessible via walker Home Care Services: No  Discharge Living Setting Plans for Discharge Living Setting: Patient's home, House, Lives with (comment) (Lives with 3 yo son in a duplex.) Type of Home at Discharge: House (Duplex.) Discharge Home Layout: One level Discharge Home Access: Stairs to enter Entrance Stairs-Number of Steps: 1-2 small steps entry Does the patient have any problems obtaining your medications?: No  Social/Family/Support Systems Patient Roles: Parent (Has mom, a sister and a 363 yo son.) Contact Information: Rosalie GumsMarilyn Dhingra - mom [phone] Anticipated Caregiver: self, mom and a friend Ability/Limitations of Caregiver: Mom is retired and lives next door. Mom helps out with patient's son. Sister comes home on Wednesdays. She can call on her friend Ciera as well. Caregiver  Availability: Other (Comment) (Mom can assist as needed.) Discharge Plan Discussed with Primary Caregiver: Yes Is Caregiver In Agreement with Plan?: Yes Does Caregiver/Family have Issues with Lodging/Transportation while Pt is in Rehab?: No  Goals/Additional Needs Patient/Family Goal for Rehab: PT/OT mod I goals Expected length of stay: 7-10 days Cultural Considerations: None Dietary Needs: Regular diet, thin liquids Equipment Needs: TBD Pt/Family Agrees to Admission and willing to participate: Yes Program Orientation Provided & Reviewed with Pt/Caregiver Including Roles & Responsibilities: Yes  Decrease burden of Care through IP rehab admission: N/A  Possible need for SNF placement upon discharge: Not anticipated  Patient Condition: This patient's condition remains as documented in the consult dated 12/18/13, in which the Rehabilitation Physician determined and documented that the patient's condition is appropriate for intensive rehabilitative care in an inpatient rehabilitation facility. Will admit to inpatient rehab today.  Preadmission Screen Completed By: Trish MageLogue, Narada Uzzle M, 12/19/2013 11:09 AM ______________________________________________________________________  Discussed status with Dr. Riley KillSwartz on 12/19/13 at 1109 and received telephone approval for admission today.  Admission Coordinator: Trish MageLogue, Joreen Swearingin M, time1109/Date11/20/15          Cosigned by: Ranelle OysterZachary T Swartz, MD at 12/19/2013 1:16 PM  Revision History

## 2013-12-22 NOTE — Progress Notes (Signed)
[facility] PHYSICAL MEDICINE & REHABILITATION     PROGRESS NOTE    Subjective/Complaints: Had a good weekend. Worked really hard!. Denies current pain.  Objective: Vital Signs: Blood pressure 114/59, pulse 68, temperature 98 F (36.7 C), temperature source Oral, resp. rate 19, height 5\' 2"  (1.575 m), last menstrual period 12/13/2013, SpO2 100 %. No results found.  Recent Labs  12/22/13 0455  WBC 1.9*  HGB 8.4*  HCT 26.1*  PLT 54*    Recent Labs  12/22/13 0455  NA 143  K 3.5*  CL 106  GLUCOSE 98  BUN 5*  CREATININE 0.61  CALCIUM 8.9   CBG (last 3)  No results for input(s): GLUCAP in the last 72 hours.  Wt Readings from Last 3 Encounters:  12/16/13 97.2 kg (214 lb 4.6 oz)  12/17/13 99.791 kg (220 lb)  10/12/13 107.502 kg (237 lb)    Physical Exam:  Constitutional: She is oriented to person, place, and time. She appears well-developed.  HENT: oral mucosa pink and moist, dentition fair Head: Normocephalic.  Eyes: EOM are normal.  Neck: Normal range of motion. Neck supple. No thyromegaly present.  Cardiovascular: Normal rate and regular rhythm. no murmurs Respiratory: Effort normal and breath sounds normal. No respiratory distress.  GI: Bowel sounds are normal. She exhibits no distension. Non tender   Skin: Skin is warm and dry.  Neuro: Alert and oriented. Good insight and awareness, CN exam intact Motor strength is 5/5 bilateral deltoids, biceps, triceps, grip 3+ to 4-/5 in the hip flexors and, 4/5 knee extensors 4/5 ankle dorsiflexor and plantarflexors.  Sensation reduced to light touch in both legs below the mid thighs, left perhaps involved more than right. Reduced proprioception in both legs as well in same distribution. Can sense gross pain. No sensory findings in upper extremities and no sensory level in abdomen. Deep tendon reflexes 1+ at patella and absent at ankles bilateral lower extremities Psych: normal affect, behavior. Musc: no edema,  no gross pain with ROM/movement  Assessment/Plan: 1. Functional deficits secondary to severe B12 deficiency polyneuropathy  which require 3+ hours per day of interdisciplinary therapy in a comprehensive inpatient rehab setting. Physiatrist is providing close team supervision and 24 hour management of active medical problems listed below. Physiatrist and rehab team continue to assess barriers to discharge/monitor patient progress toward functional and medical goals. FIM: FIM - Bathing Bathing Steps Patient Completed: Chest, Right Arm, Left Arm, Abdomen, Front perineal area, Buttocks, Right upper leg, Left upper leg, Right lower leg (including foot), Left lower leg (including foot) Bathing: 4: Steadying assist  FIM - Upper Body Dressing/Undressing Upper body dressing/undressing steps patient completed: Thread/unthread right bra strap, Thread/unthread left bra strap, Hook/unhook bra Upper body dressing/undressing: 5: Set-up assist to: Obtain clothing/put away FIM - Lower Body Dressing/Undressing Lower body dressing/undressing steps patient completed: Thread/unthread right pants leg, Thread/unthread left pants leg, Pull pants up/down, Don/Doff right sock, Don/Doff left sock Lower body dressing/undressing: 4: Steadying Assist  FIM - Toileting Toileting steps completed by patient: Adjust clothing prior to toileting, Performs perineal hygiene, Adjust clothing after toileting Toileting Assistive Devices: Grab bar or rail for support Toileting: 4: Steadying assist  FIM - Diplomatic Services operational officerToilet Transfers Toilet Transfers Assistive Devices: PsychiatristBedside commode Toilet Transfers: 4-To toilet/BSC: Min A (steadying Pt. > 75%), 4-From toilet/BSC: Min A (steadying Pt. > 75%)  FIM - BankerBed/Chair Transfer Bed/Chair Transfer Assistive Devices: Arm rests, Therapist, occupationalWalker Bed/Chair Transfer: 5: Supine > Sit: Supervision (verbal cues/safety issues), 5: Sit > Supine: Supervision (verbal cues/safety issues),  4: Bed > Chair or W/C: Min A  (steadying Pt. > 75%), 4: Chair or W/C > Bed: Min A (steadying Pt. > 75%)  FIM - Locomotion: Wheelchair Distance: 100' Locomotion: Wheelchair: 2: Travels 50 - 149 ft with supervision, cueing or coaxing FIM - Locomotion: Ambulation Locomotion: Ambulation Assistive Devices: Designer, industrial/productWalker - Rolling Ambulation/Gait Assistance: 4: Min assist (75'x2) Locomotion: Ambulation: 2: Travels 50 - 149 ft with minimal assistance (Pt.>75%)  Comprehension Comprehension Mode: Auditory Comprehension: 5-Understands complex 90% of the time/Cues < 10% of the time  Expression Expression Mode: Verbal Expression: 5-Expresses basic 90% of the time/requires cueing < 10% of the time.  Social Interaction Social Interaction: 5-Interacts appropriately 90% of the time - Needs monitoring or encouragement for participation or interaction.  Problem Solving Problem Solving: 5-Solves complex 90% of the time/cues < 10% of the time  Memory Memory: 6-More than reasonable amt of time  Medical Problem List and Plan: 1. Functional deficits secondary to severe B12 deficiency with peripheral polyneuropathy. Pt generally debilitated as well 2. DVT Prophylaxis/Anticoagulation: SCDs. Monitor for any signs of DVT 3. Pain Management: Tylenol as needed 4. Mood/depression: Wellbutrin 150 mg daily, Xanax as needed. Provide emotional support\  -appears to be in good spirits at present 5. Neuropsych: This patient is capable of making decisions on her own behalf. 6. Skin/Wound Care: Routine skin checks 7. Fluids/Electrolytes/Nutrition: Strict I and os. Follow-up chemistries.  -k+ supplement 8. Probable UTI. Urine cultures pending 12/18/2013. Continue Cipro and await cultures 9. History of bronchial asthma. Albuterol nebulizer as needed. 10. Pancytopenia. Labs appear to be trending up.   -check serial labs (recheck Wednesday) LOS (Days) 3 A FACE TO FACE EVALUATION WAS PERFORMED  Ammy Lienhard T 12/22/2013 7:43 AM

## 2013-12-22 NOTE — Plan of Care (Signed)
Problem: RH BOWEL ELIMINATION Goal: RH STG MANAGE BOWEL WITH ASSISTANCE STG Manage Bowel with min Assistance.  Outcome: Progressing Goal: RH STG MANAGE BOWEL W/MEDICATION W/ASSISTANCE STG Manage Bowel with Medication with min Assistance.  Outcome: Progressing  Problem: RH BLADDER ELIMINATION Goal: RH STG MANAGE BLADDER WITH ASSISTANCE STG Manage Bladder With min Assistance  Outcome: Progressing Goal: RH STG MANAGE BLADDER WITH EQUIPMENT WITH ASSISTANCE STG Manage Bladder With Equipment With min Assistance  Outcome: Progressing  Problem: RH SKIN INTEGRITY Goal: RH STG SKIN FREE OF INFECTION/BREAKDOWN Remain free from infection and breakdown while on rehab with min assist  Outcome: Progressing Goal: RH STG MAINTAIN SKIN INTEGRITY WITH ASSISTANCE STG Maintain Skin Integrity With min Assistance.  Outcome: Progressing  Problem: RH SAFETY Goal: RH STG ADHERE TO SAFETY PRECAUTIONS W/ASSISTANCE/DEVICE STG Adhere to Safety Precautions With min Assistance/Device.  Outcome: Progressing Goal: RH STG DECREASED RISK OF FALL WITH ASSISTANCE STG Decreased Risk of Fall With min Assistance.  Outcome: Progressing  Problem: RH PAIN MANAGEMENT Goal: RH STG PAIN MANAGED AT OR BELOW PT'S PAIN GOAL <2 on a 0-10 scale  Outcome: Progressing

## 2013-12-22 NOTE — Progress Notes (Signed)
Physical Therapy Session Note  Patient Details  Name: Esmeralda ArthurCandace L Caul MRN: [id number] Date of Birth: 12-22-1985  Today's Date: 12/22/2013 PT Individual Time: 0900-1000 PT Individual Time Calculation (min): 60 min   Short Term Goals: Week 1:  PT Short Term Goal 1 (Week 1): Pt to maintain dynamic standing balance without B UE support and min A PT Short Term Goal 2 (Week 1): Pt to perform SPT bed<>w/c with RW and supervison, 50% of time PT Short Term Goal 3 (Week 1): Pt to propel w/c 200' with B UE and supervision demonstrating fluid and consistent pace PT Short Term Goal 4 (Week 1): Pt to ambulate 100' with RW and superivsion, 50% of time PT Short Term Goal 5 (Week 1): Pt to negotiate up/down 2 steps with RW and min A  Skilled Therapeutic Interventions/Progress Updates:    Pt received supine in bed, agreeable to participate in therapy w/ min encouragement. Pt ambulated 50' then 4740' with seated rest break in between. Pt able to ambulate w/ MinA, no LOB noted during gait. Pt able to identify when she needed to rest and wait until wheelchair was setup for stand>sit. Pt's gait characterized by mild ataxia and genu recurvatum in stance phase bilaterally. In rehab gym worked on quad strengthening in standing and supine to address stability in stance. Standing marches w/ swedish knee cage on R then L to practice standing w/ knee in slight flexion. Blocked practice stair training x6 5" stairs w/ Min-ModA. Ambulated 1050' w/ RW towards room w/ MinGuard, then propelled w/c remaining distance. SPT w/ RW and MinGuard, then sit>supine w/ SBA. Pt left supine in bed w/ all needs within reach.   Therapy Documentation Precautions:  Precautions Precautions: Fall Restrictions Weight Bearing Restrictions: No Vital Signs: Therapy Vitals Temp: 98 F (36.7 C) Temp Source: Oral Pulse Rate: 68 Resp: 19 BP: (!) 114/59 mmHg Patient Position (if appropriate): Lying Oxygen Therapy SpO2: 100 % O2 Device: Not  Delivered Pain:   No/denies pain  See FIM for current functional status  Therapy/Group: Individual Therapy  Hosie SpangleGodfrey, Jacarius Handel 12/22/2013, 7:49 AM

## 2013-12-22 NOTE — Progress Notes (Signed)
Occupational Therapy Session Note  Patient Details  Name: Jill Summers MRN: [id number] Date of Birth: 1985-03-02  Today's Date: 12/22/2013 OT Individual Time: [id number] OT Calculated Individual Time (min): 60 min   Short Term Goals: Week 1:  OT Short Term Goal 1 (Week 1): Pt will perform LB dressing with Min A in order to increase I in self care.  OT Short Term Goal 2 (Week 1): Pt will perform shower transfer with Min A in order to increase I in functional transfers. OT Short Term Goal 3 (Week 1): Pt will perform 15 minutes of functional activities without rest break in order to increase endurance for ADL and IADL tasks.  Skilled Therapeutic Interventions/Progress Updates:  Skilled OT session focusing on ADL retraining, functional ambulation, functional transfers, safety with RW, dynamic standing balance, and activity tolerance/endurance. Pt supine in bed and asleep when arriving, arousing easily and reporting no pain. Pt transferred to EOB and then ambulated with RW to gather clothing. Pt with 1 LOB when reaching forward to gather clothing from low drawer, educated pt on safe hand placement when using RW as steady support. Pt ambulated to bathroom and transferred to TTB. Pt completed UB/LB bathing with steady assist for bathing perineal area. Pt ambulated to EOB and completed UB/LB dressing. Pt ambulated to dresser and applied deodorant and then ambulated to sink to complete tooth brushing. Pt ambulated and then self-propelled w/c to family room and transferred to couch. Discussed pt d/c planning, home set-up, goals of pt, and goals of therapy. Pt asking about "numbness" coming back, therapist reporting that this was a good question to write down and ask doctor during their next meeting. Pt ambulated and then self-propelled w/c back to room and transferred to EOB. Pt supine in bed with all needs nearby when leaving.  Therapy Documentation Precautions:  Precautions Precautions:  Fall Restrictions Weight Bearing Restrictions: No  See FIM for current functional status  Therapy/Group: Individual Therapy  Jill Summers 12/22/2013, 7:15 AM

## 2013-12-22 NOTE — Progress Notes (Signed)
Physical Therapy Session Note  Patient Details  Name: Jill Summers MRN: [id number] Date of Birth: [birth year]  Today's Date: 12/22/2013 PT Individual Time: 1500-1600 PT Individual Time Calculation (min): 60 min   Short Term Goals: Week 1:  PT Short Term Goal 1 (Week 1): Pt to maintain dynamic standing balance without B UE support and min A PT Short Term Goal 2 (Week 1): Pt to perform SPT bed<>w/c with RW and supervison, 50% of time PT Short Term Goal 3 (Week 1): Pt to propel w/c 200' with B UE and supervision demonstrating fluid and consistent pace PT Short Term Goal 4 (Week 1): Pt to ambulate 100' with RW and superivsion, 50% of time PT Short Term Goal 5 (Week 1): Pt to negotiate up/down 2 steps with RW and min A  Skilled Therapeutic Interventions/Progress Updates:    Pt received sitting in bed, agreeable to therapy. Pt propelled w/c using BUE x 150 ft with supervision. Pt negotiated up/down 3 stairs x 2 using 2 rails with close supervision, pt electing reciprocal and step-to pattern. Pt performed car transfer to SUV height x 2 using RW with close supervision and initial demonstration by therapist and max verbal cues for sequencing. Throughout session, pt requires verbal cues for hand placement with sit <> stand transfers to push up from surface and reach for surface, no carryover noted. Attempted to fit pt with swedish knee cage to control B knee hyperextension in stance phase, however did not have appropriate size. Utilized ace wraps starting above knee to decrease B knee hyperextension with pt reporting improvement in gait and decreased hyperextension noted. Gait training with wraps using RW 2 x 90 ft, supervision. Pt participated in game of horseshoes to challenge knee control and dynamic balance, performing squat to reach for horseshoes on low stool and tossing shoes without UE support. Pt used RW to walk to horseshoes and pick up with min A. After seated rest, pt returned horseshoes to  shelf and ambulated back to room using RW x 150 ft with supervision and no rest breaks. Pt left seated edge of bed with all needs within reach and bed alarm on.   Therapy Documentation Precautions:  Precautions Precautions: Fall Restrictions Weight Bearing Restrictions: No Pain: Pain Assessment Pain Assessment: No/denies pain  See FIM for current functional status  Therapy/Group: Individual Therapy  Kerney ElbeVarner, Maurissa Ambrose A 12/22/2013, 4:17 PM

## 2013-12-22 NOTE — Progress Notes (Signed)
Expand All Collapse All        Physical Medicine and Rehabilitation Consult Reason for Consult: Debilitation/pancytopenia Referring Physician: [facility]   HPI: Jill Summers is a 28 y.o. right handed female with history of depression and bronchial asthma as well as recent cellulitis of the umbilicus and treated with Bactrim in September 2015 as well as recent MRSA PCR screen +2 months ago. Patient lives with children independent prior to admission and her mother has been assisting with care of the children recently. Admitted 12/16/2013 with bouts of dizziness over the past 2 weeks as well as lower strandy weakness and numbness. She denied any falls. No change in bowel or bladder. Cranial CT scan negative. MRI of thoracic lumbar spine showed multiple bilateral thoracic cord lesions in the lateral white matter most compatible with demyelinating disease of question etiology and workup ongoing as per neurology services and fell cord lesions most likely manifestations of B-12 deficiency. Partial visualization of lower cervical cord shows white matter lesions with full cervical spine films pending. MRI of the brain and cervical spinal cord are pending. Found to have severe pancytopenia with WBC 1.4, ANC 0.4, hemoglobin 5.5, MCV 101.9, platelets 37,000. Hematology service is consulted with workup ongoing. She was transfused. Etiology still remains unclear and so far felt to be secondary to B-12 deficiency and currently maintained on supplementation. Patient remains on broad-spectrum antibiotics. Physical therapy evaluation completed 12/18/2013 with recommendations of physical medicine rehabilitation consult.  Patient complains of bladder urgency. No bowel issues She states numbness is from the knee down to her feet. Denies any hand symptoms. Review of Systems  Constitutional: Positive for fever.  Neurological: Positive for dizziness, tingling and weakness.  All other systems reviewed and are  negative.  Past Medical History  Diagnosis Date  . Asthma   . Obesity   . Adopted   . Depression   . Chlamydia   . Bacterial vaginosis   . Urinary tract infection   . MRSA infection    Past Surgical History  Procedure Laterality Date  . Cesarean section    . Cesarean section with bilateral tubal ligation     Family History  Problem Relation Age of Onset  . Adopted: Yes  . Hypertension Mother   . Diabetes Father    Social History:  reports that she has never smoked. She has never used smokeless tobacco. She reports that she does not drink alcohol or use illicit drugs. Allergies:  Allergies  Allergen Reactions  . Peanut-Containing Drug Products Anaphylaxis, Hives and Swelling    *Throat swelling*  . Shellfish Allergy Anaphylaxis, Hives and Swelling    Throat swelling   Medications Prior to Admission  Medication Sig Dispense Refill  . albuterol (PROVENTIL HFA;VENTOLIN HFA) 108 (90 BASE) MCG/ACT inhaler Inhale 2 puffs into the lungs every 6 (six) hours as needed for wheezing or shortness of breath. For asthma    . cetirizine (ZYRTEC) 10 MG tablet Take 10 mg by mouth at bedtime.     Marland Kitchen. buPROPion (WELLBUTRIN XL) 150 MG 24 hr tablet Take 150 mg by mouth daily.     Marland Kitchen. ibuprofen (ADVIL,MOTRIN) 200 MG tablet Take 400 mg by mouth every 8 (eight) hours as needed (for back pain.).    Marland Kitchen. meclizine (ANTIVERT) 25 MG tablet Take 1 tablet (25 mg total) by mouth 4 (four) times daily as needed. (Patient not taking: Reported on 12/15/2013) 40 tablet 0  . mupirocin ointment (BACTROBAN) 2 % Place 1 application into the nose 2 (  two) times daily. (Patient not taking: Reported on 12/15/2013) 22 g 0  . ondansetron (ZOFRAN) 4 MG tablet Take 1 tablet (4 mg total) by mouth every 8 (eight) hours as needed for nausea or vomiting. (Patient not taking: Reported on 12/15/2013) 12 tablet 0    Home: Home  Living Family/patient expects to be discharged to:: Private residence Living Arrangements: Children Type of Home: House (duplex) Home Access: Stairs to enter Secretary/administrator of Steps: 2 Entrance Stairs-Rails: Left Home Layout: One level Home Equipment: None  Functional History: Prior Function Level of Independence: Independent Comments: vertigo started 3 weeks ago and had been unable to do housework without stumbling around; states stayed laying down most of the time; mother was helping with children Functional Status:  Mobility: Bed Mobility Overal bed mobility: Needs Assistance Bed Mobility: Supine to Sit Supine to sit: Supervision, Min guard General bed mobility comments: repots has gotten up to [facility] on her own (advised for safety to call for help) Transfers Overall transfer level: Needs assistance Transfers: Sit to/from Stand Sit to Stand: Min assist General transfer comment: uses UE's on walker or bed and relies on momentum strategy Ambulation/Gait Ambulation/Gait assistance: Mod assist Ambulation Distance (Feet): 7 Feet (and 3') Assistive device: Rolling walker (2 wheeled) Gait Pattern/deviations: Step-through pattern, Trunk flexed, Shuffle, Wide base of support General Gait Details: assist with walker with turns and backing up due to heavy UE reliance    ADL:    Cognition: Cognition Overall Cognitive Status: Within Functional Limits for tasks assessed Orientation Level: Oriented X4 Cognition Arousal/Alertness: Awake/alert Behavior During Therapy: WFL for tasks assessed/performed Overall Cognitive Status: Within Functional Limits for tasks assessed  Blood pressure 104/58, pulse 91, temperature 99.2 F (37.3 C), temperature source Oral, resp. rate 80, height 5\' 2"  (1.575 m), weight 97.2 kg (214 lb 4.6 oz), last menstrual period 12/13/2013, SpO2 100 %. Physical Exam  Constitutional: She is oriented to person, place, and time. She appears well-developed.   HENT:  Head: Normocephalic.  Eyes: EOM are normal.  Neck: Normal range of motion. Neck supple. No thyromegaly present.  Cardiovascular: Normal rate and regular rhythm.  Respiratory: Effort normal and breath sounds normal. No respiratory distress.  GI: Bowel sounds are normal. She exhibits no distension.  Neurological: She is alert and oriented to person, place, and time.  Skin: Skin is warm and dry.  Motor strength is 5/5 bilateral deltoids, biceps, triceps, grip 4/5 in the hip flexors and knee extensors 3 minus ankle dorsiflexor plantar flexor 2 minus total flexor extensor Sensation reduced to light touch in the left foot Reduced proprioception in both feet Sitting balance is good Deep tendon reflexes 0 bilateral lower extremities  Lab Results Last 24 Hours    No results found for this or any previous visit (from the past 24 hour(s)).    Imaging Results (Last 48 hours)    Mr Thoracic Spine W Wo Contrast  12/17/2013 CLINICAL DATA: Bilateral leg weakness. Mid and low back pain. Initial encounter. EXAM: MRI THORACIC AND LUMBAR SPINE WITHOUT AND WITH CONTRAST TECHNIQUE: Multiplanar and multiecho pulse sequences of the thoracic and lumbar spine were obtained without and with intravenous contrast. CONTRAST: 20mL MULTIHANCE GADOBENATE DIMEGLUMINE 529 MG/ML IV SOLN COMPARISON: None. FINDINGS: MR THORACIC SPINE FINDINGS Segmentation: Counting was performed from the craniocervical junction. Segmentation is anatomic with 7 cervical, 12 thoracic and 5 lumbar type vertebral bodies. Alignment: Anatomic. Vertebrae: Normal. Active red marrow throughout the spine. Cord: There are small peripheral cord lesions in the lateral aspect  of the cord bilaterally throughout the thoracic spine. The show increased T2 signal and are only well appreciated on the axial images due to partial volume averaging on the sagittal images. When correlating axial and sagittal images, they be can be seen on the  inversion recovery images. Most prominent lesion is at the T5-T6 level on the LEFT. None of these lesions show post gadolinium enhancement to suggest an active demyelinating disease these are confined to white matter tracks without involvement of the gray matter. These are also visualized in the lower cervical cord at C7-T1. Paraspinal tissues: Normal. Disc levels: Normal thoracic intervertebral discs. No stenosis. MR LUMBAR SPINE FINDINGS Alignment: Anatomic. Vertebrae: Normal. Conus medullaris: Normal at L1-L2. Paraspinal tissues: Normal. Disc levels: Normal. No disc herniations or degenerative disease. After contrast administration, there is no abnormal enhancement in the lumbar spine. IMPRESSION: 1. Multiple bilateral thoracic cord lesions in the lateral white matter, most compatible with prior demyelinating disease. No active demyelinating disease in the thoracic spine. This could be seen with multiple sclerosis, NMO, vasculitis, neurosarcoidosis, or ADEM among other causes. In this age group and with this pattern, multiple sclerosis is most likely. 2. Partial visualization of the lower cervical cord shows white matter lesions an cervical spine MRI with and without contrast is recommended to complete the assessment. An MRI of the brain should also be considered to assess for involvement. 3. Normal MRI of the lumbar spine. Electronically Signed By: Andreas Newport M.D. On: 12/17/2013 16:00   Mr Lumbar Spine W Wo Contrast  12/17/2013 CLINICAL DATA: Bilateral leg weakness. Mid and low back pain. Initial encounter. EXAM: MRI THORACIC AND LUMBAR SPINE WITHOUT AND WITH CONTRAST TECHNIQUE: Multiplanar and multiecho pulse sequences of the thoracic and lumbar spine were obtained without and with intravenous contrast. CONTRAST: 20mL MULTIHANCE GADOBENATE DIMEGLUMINE 529 MG/ML IV SOLN COMPARISON: None. FINDINGS: MR THORACIC SPINE FINDINGS Segmentation: Counting was performed from the  craniocervical junction. Segmentation is anatomic with 7 cervical, 12 thoracic and 5 lumbar type vertebral bodies. Alignment: Anatomic. Vertebrae: Normal. Active red marrow throughout the spine. Cord: There are small peripheral cord lesions in the lateral aspect of the cord bilaterally throughout the thoracic spine. The show increased T2 signal and are only well appreciated on the axial images due to partial volume averaging on the sagittal images. When correlating axial and sagittal images, they be can be seen on the inversion recovery images. Most prominent lesion is at the T5-T6 level on the LEFT. None of these lesions show post gadolinium enhancement to suggest an active demyelinating disease these are confined to white matter tracks without involvement of the gray matter. These are also visualized in the lower cervical cord at C7-T1. Paraspinal tissues: Normal. Disc levels: Normal thoracic intervertebral discs. No stenosis. MR LUMBAR SPINE FINDINGS Alignment: Anatomic. Vertebrae: Normal. Conus medullaris: Normal at L1-L2. Paraspinal tissues: Normal. Disc levels: Normal. No disc herniations or degenerative disease. After contrast administration, there is no abnormal enhancement in the lumbar spine. IMPRESSION: 1. Multiple bilateral thoracic cord lesions in the lateral white matter, most compatible with prior demyelinating disease. No active demyelinating disease in the thoracic spine. This could be seen with multiple sclerosis, NMO, vasculitis, neurosarcoidosis, or ADEM among other causes. In this age group and with this pattern, multiple sclerosis is most likely. 2. Partial visualization of the lower cervical cord shows white matter lesions an cervical spine MRI with and without contrast is recommended to complete the assessment. An MRI of the brain should also be considered to assess  for involvement. 3. Normal MRI of the lumbar spine. Electronically Signed By: Andreas NewportGeoffrey Lamke M.D. On:  12/17/2013 16:00     Assessment/Plan: Diagnosis: Paraplegia secondary to spinal cord lesion, workup pending 1. Does the need for close, 24 hr/day medical supervision in concert with the patient's rehab needs make it unreasonable for this patient to be served in a less intensive setting? Potentially 2. Co-Morbidities requiring supervision/potential complications: B12 deficiency, neurogenic bladder 3. Due to bladder management, bowel management, safety, skin/wound care, disease management, medication administration and patient education, does the patient require 24 hr/day rehab nursing? Potentially 4. Does the patient require coordinated care of a physician, rehab nurse, PT (1-2 hrs/day, 5 days/week) and OT (1-2 hrs/day, 5 days/week) to address physical and functional deficits in the context of the above medical diagnosis(es)? Potentially Addressing deficits in the following areas: balance, endurance, locomotion, strength, transferring, bowel/bladder control, bathing, dressing, feeding, grooming and toileting 5. Can the patient actively participate in an intensive therapy program of at least 3 hrs of therapy per day at least 5 days per week? Potentially 6. The potential for patient to make measurable gains while on inpatient rehab is excellent 7. Anticipated functional outcomes upon discharge from inpatient rehab are modified independent with PT, modified independent with OT, n/a with SLP. 8. Estimated rehab length of stay to reach the above functional goals is: 7-10 days 9. Does the patient have adequate social supports and living environment to accommodate these discharge functional goals? Potentially 10. Anticipated D/C setting: Home 11. Anticipated post D/C treatments: Outpatient therapy 12. Overall Rehab/Functional Prognosis: excellent  RECOMMENDATIONS: This patient's condition is appropriate for continued rehabilitative care in the following setting: anticipate CIR once workup is completed  and further treatment plan is established Patient has agreed to participate in recommended program. Yes Note that insurance prior authorization may be required for reimbursement for recommended care.  Comment: MRI of the cervical spine and brain to be performed today at around 5 PM    12/18/2013

## 2013-12-22 NOTE — IPOC Note (Signed)
Overall Plan of Care [facility](IPOC) Patient Details Name: Jill Summers MRN: [id number] DOB: 02-Aug-1985  Admitting Diagnosis: Vit B12 def  paraplegia  Hospital Problems: Active Problems:   Pancytopenia   Protein-calorie malnutrition, severe   Paraplegia   Polyneuropathy due to vitamin B deficiency     Functional Problem List: Nursing Behavior, Edema, Endurance, Motor, Nutrition, Pain, Perception, Safety, Skin Integrity, Medication Management, Sensory  PT Balance, Endurance, Motor, Safety, Other (comment) (strength)  OT Balance, Sensory, Endurance, Motor, Safety  SLP    TR         Basic ADL's: OT Grooming, Bathing, Dressing, Toileting     Advanced  ADL's: OT Simple Meal Preparation, Laundry     Transfers: PT Bed Mobility, Bed to Chair, State Street CorporationFurniture, Set designerCar, Civil [employer] fast [place]  OT Toilet, Research scientist (life sciences)Tub/Shower     Locomotion: PT Stairs, Ambulation, Wheelchair Mobility     Additional Impairments: OT None  SLP        TR      Anticipated Outcomes Item Anticipated Outcome  Self Feeding n/a  Swallowing      Basic self-care  supervision  Toileting  supervision   Bathroom Transfers supervision  Bowel/Bladder  Min Assist  Transfers  Mod(I)-Supervision  Locomotion  Mod(I)-Supervision  Communication     Cognition     Pain  <2 on a 0-10 scale  Safety/Judgment  Min Assist   Therapy Plan: PT Intensity: Minimum of 1-2 x/day ,45 to 90 minutes PT Frequency: 5 out of 7 days PT Duration Estimated Length of Stay: 14-16days OT Intensity: Minimum of 1-2 x/day, 45 to 90 minutes OT Frequency: 5 out of 7 days OT Duration/Estimated Length of Stay: 14-16 days         Team Interventions: Nursing Interventions Patient/Family Education, Disease Management/Prevention, Pain Management, Medication Management, Skin Care/Wound Management, Discharge Planning, Psychosocial Support  PT interventions Ambulation/gait training, Community reintegration, DME/adaptive equipment instruction, Neuromuscular  re-education, Psychosocial support, Stair training, UE/LE Strength taining/ROM, Wheelchair propulsion/positioning, UE/LE Coordination activities, Therapeutic Activities, Skin care/wound management, Pain management, Discharge planning, Warden/rangerBalance/vestibular training, Disease management/prevention, Functional mobility training, Patient/family education, Therapeutic Exercise, Visual/perceptual remediation/compensation  OT Interventions Balance/vestibular training, Discharge planning, Self Care/advanced ADL retraining, Therapeutic Activities, UE/LE Coordination activities, Functional mobility training, Patient/family education, Therapeutic Exercise, UE/LE Strength taining/ROM, Psychosocial support, Neuromuscular re-education, DME/adaptive equipment instruction, Community reintegration  SLP Interventions    TR Interventions    SW/CM Interventions      Team Discharge Planning: Destination: PT-Home ,OT- Home , SLP-  Projected Follow-up: PT-Home health PT, OT-  24 hour supervision/assistance, Outpatient OT, SLP-  Projected Equipment Needs: PT-To be determined, OT- Tub/shower bench, SLP-  Equipment Details: PT- , OT-Pt owns no equipment at this time Patient/family involved in discharge planning: PT- Patient,  OT-Patient, SLP-   MD ELOS: 14-16 days Medical Rehab Prognosis:  Excellent Assessment: The patient has been admitted for CIR therapies with the diagnosis of B12 deficiency polyneuropathy. The team will be addressing functional mobility, strength, stamina, balance, safety, adaptive techniques and equipment, self-care, bowel and bladder mgt, patient and caregiver education, pain mgt, NMR, orthotic, ego support, nutrition. Goals have been set at supervision for basic self-care and mobility and mod I for locomotion and transfers.    Jill OysterZachary T. Kc Summerson, MD, FAAPMR      See Team Conference Notes for weekly updates to the plan of care

## 2013-12-22 NOTE — Progress Notes (Signed)
Occupational Therapy Session Note  Patient Details  Name: Esmeralda ArthurCandace L Armwood MRN: [id number] Date of Birth: Dec 05, 1985  Today's Date: 12/22/2013 OT Individual Time: 1300-1400 OT Individual Time Calculation (min): 60 min    Short Term Goals: Week 1:  OT Short Term Goal 1 (Week 1): Pt will perform LB dressing with Min A in order to increase I in self care.  OT Short Term Goal 2 (Week 1): Pt will perform shower transfer with Min A in order to increase I in functional transfers. OT Short Term Goal 3 (Week 1): Pt will perform 15 minutes of functional activities without rest break in order to increase endurance for ADL and IADL tasks.  Skilled Therapeutic Interventions/Progress Updates:    Engaged in therapeutic activity with focus on functional mobility with RW and activity tolerance in standing.  Pt ambulated from room to therapy gym > 150 feet with only 1 rest break (after 93 feet). Pt overall supervision with ambulation this session.  In therapy gym engaged in Wii bowling and Wii fit balance activities with focus on standing balance without UE support and overall activity tolerance in standing.  Pt able to stand 5 mins x4 with SBA and intermittent UE support (approx 50% of time) while completing activities.  As session progressed pt reports increase in fatigue and reports BUE feeling heavy.  Pt returned to room via therapist pushing pt in w/c.  Therapy Documentation Precautions:  Precautions Precautions: Fall Restrictions Weight Bearing Restrictions: No Pain:  Pt with no c/o pain  See FIM for current functional status  Therapy/Group: Individual Therapy  Rosalio LoudHOXIE, Santiel Topper 12/22/2013, 2:39 PM

## 2013-12-22 NOTE — Progress Notes (Signed)
Patient information reviewed and entered into eRehab system by Seham Gardenhire, RN, CRRN, PPS Coordinator.  Information including medical coding and functional independence measure will be reviewed and updated through discharge.    

## 2013-12-22 NOTE — Plan of Care (Signed)
Problem: RH BOWEL ELIMINATION Goal: RH STG MANAGE BOWEL WITH ASSISTANCE STG Manage Bowel with min Assistance.  Outcome: Progressing Goal: RH STG MANAGE BOWEL W/MEDICATION W/ASSISTANCE STG Manage Bowel with Medication with min Assistance.  Outcome: Progressing  Problem: RH BLADDER ELIMINATION Goal: RH STG MANAGE BLADDER WITH ASSISTANCE STG Manage Bladder With min Assistance  Outcome: Progressing Goal: RH STG MANAGE BLADDER WITH EQUIPMENT WITH ASSISTANCE STG Manage Bladder With Equipment With min Assistance  Outcome: Progressing  Problem: RH SKIN INTEGRITY Goal: RH STG SKIN FREE OF INFECTION/BREAKDOWN Remain free from infection and breakdown while on rehab with min assist  Outcome: Progressing Goal: RH STG MAINTAIN SKIN INTEGRITY WITH ASSISTANCE STG Maintain Skin Integrity With min Assistance.  Outcome: Progressing  Problem: RH SAFETY Goal: RH STG ADHERE TO SAFETY PRECAUTIONS W/ASSISTANCE/DEVICE STG Adhere to Safety Precautions With min Assistance/Device.  Outcome: Progressing Goal: RH STG DECREASED RISK OF FALL WITH ASSISTANCE STG Decreased Risk of Fall With min Assistance.  Outcome: Progressing  Problem: RH PAIN MANAGEMENT Goal: RH STG PAIN MANAGED AT OR BELOW PT'S PAIN GOAL <2 on a 0-10 scale  Outcome: Progressing     

## 2013-12-23 ENCOUNTER — Inpatient Hospital Stay (HOSPITAL_COMMUNITY): Payer: Medicaid Other | Admitting: Occupational Therapy

## 2013-12-23 ENCOUNTER — Encounter (HOSPITAL_COMMUNITY): Payer: Medicaid Other | Admitting: Occupational Therapy

## 2013-12-23 ENCOUNTER — Inpatient Hospital Stay (HOSPITAL_COMMUNITY): Payer: Medicaid Other

## 2013-12-23 LAB — INTRINSIC FACTOR ANTIBODIES: Intrinsic Factor: POSITIVE

## 2013-12-23 MED ORDER — HYDROCERIN EX CREA
TOPICAL_CREAM | Freq: Two times a day (BID) | CUTANEOUS | Status: DC
  Administered 2013-12-23: 1 via TOPICAL
  Administered 2013-12-24 – 2013-12-26 (×6): via TOPICAL
  Administered 2013-12-27: 1 via TOPICAL
  Administered 2013-12-27: 21:00:00 via TOPICAL
  Administered 2013-12-28: 1 via TOPICAL
  Administered 2013-12-29: 08:00:00 via TOPICAL
  Administered 2013-12-29: 1 via TOPICAL
  Administered 2013-12-30: 08:00:00 via TOPICAL
  Filled 2013-12-23: qty 113

## 2013-12-23 NOTE — Progress Notes (Signed)
Physical Therapy Session Note  Patient Details  Name: Jill Summers MRN: [id number] Date of Birth: 1985/12/17  Today's Date: 12/23/2013 PT Individual Time: 1300-1400 PT Individual Time Calculation (min): 60 min   Short Term Goals: Week 1:  PT Short Term Goal 1 (Week 1): Pt to maintain dynamic standing balance without B UE support and min A PT Short Term Goal 2 (Week 1): Pt to perform SPT bed<>w/c with RW and supervison, 50% of time PT Short Term Goal 3 (Week 1): Pt to propel w/c 200' with B UE and supervision demonstrating fluid and consistent pace PT Short Term Goal 4 (Week 1): Pt to ambulate 100' with RW and superivsion, 50% of time PT Short Term Goal 5 (Week 1): Pt to negotiate up/down 2 steps with RW and min A  Skilled Therapeutic Interventions/Progress Updates:   Session focused on functional transfers, gait training, stair negotiation and neuro re-ed for BLE balance, coordination, and motor control. Pt required up to steady A during gait to/from therapy for balance during turns and due to occasional knee buckling. Neuro re-ed including balance training on compliant surface to activate ankle strategies while performing reaching task with 1 UE and no UE support and min A (mod A  X 1 for LOB anterior), Biodex for balance training on compliant and non compliant surface to work on weightshifting in all directions with overall min A, and toe taps and step ups (anterior and laterally) on stairs to work on coordination and functional strengthening as well as control with min to heavy min A due to decreased control at the knee. Dynamic gait through obstacle course navigating cones and stepping over simulated threshold x 2 reps with RW with min A overall and cues for staying inside RW for safety. Discussed visual compensation techniques due to decreased sensation and proprioception in BLE during functional tasks to decrease fall risk. Pt able to gait to and from therapy gym today during session  which she reports is a big improvement. Returned to bed end of session to rest.   Therapy Documentation Precautions:  Precautions Precautions: Fall Restrictions Weight Bearing Restrictions: No   Pain: Pain Assessment Pain Score: 0-No pain Locomotion : Ambulation Ambulation/Gait Assistance: 4: Min guard;5: Supervision  See FIM for current functional status  Therapy/Group: Individual Therapy  [name], G[facility] [place] 12/23/2013, 2:11 PM

## 2013-12-23 NOTE — Plan of Care (Signed)
Problem: RH BOWEL ELIMINATION Goal: RH STG MANAGE BOWEL WITH ASSISTANCE STG Manage Bowel with min Assistance.  Outcome: Progressing Goal: RH STG MANAGE BOWEL W/MEDICATION W/ASSISTANCE STG Manage Bowel with Medication with min Assistance.  Outcome: Progressing  Problem: RH BLADDER ELIMINATION Goal: RH STG MANAGE BLADDER WITH ASSISTANCE STG Manage Bladder With min Assistance  Outcome: Progressing  Problem: RH SKIN INTEGRITY Goal: RH STG SKIN FREE OF INFECTION/BREAKDOWN Remain free from infection and breakdown while on rehab with min assist  Outcome: Progressing Goal: RH STG MAINTAIN SKIN INTEGRITY WITH ASSISTANCE STG Maintain Skin Integrity With min Assistance.  Outcome: Progressing  Problem: RH SAFETY Goal: RH STG ADHERE TO SAFETY PRECAUTIONS W/ASSISTANCE/DEVICE STG Adhere to Safety Precautions With min Assistance/Device.  Outcome: Progressing Goal: RH STG DECREASED RISK OF FALL WITH ASSISTANCE STG Decreased Risk of Fall With min Assistance.  Outcome: Progressing  Problem: RH PAIN MANAGEMENT Goal: RH STG PAIN MANAGED AT OR BELOW PT'S PAIN GOAL <2 on a 0-10 scale  Outcome: Progressing

## 2013-12-23 NOTE — Progress Notes (Signed)
Occupational Therapy Session Notes  Patient Details  Name: Jill Summers MRN: [id number] Date of Birth: [birth year]  Today's Date: 12/23/2013   Short Term Goals: Week 1:  OT Short Term Goal 1 (Week 1): Pt will perform LB dressing with Min A in order to increase I in self care.  OT Short Term Goal 2 (Week 1): Pt will perform shower transfer with Min A in order to increase I in functional transfers. OT Short Term Goal 3 (Week 1): Pt will perform 15 minutes of functional activities without rest break in order to increase endurance for ADL and IADL tasks.  Skilled Therapeutic Interventions/Progress Updates:   Session 1: OT Individual Time: 7829-56210730-0830 OT Calculated Individual Time (min): 60 min Skilled OT session focusing on ADL retraining, functional ambulation, functional transfers, standing tolerance, DME use, safety with RW, d/c planning, and activity tolerance/endurance. Pt supine in bed and asleep when arriving, arousing easily and reporting no pain. Pt transferred to EOB and then ambulated to dresser to gather clothing, with no LOB. Pt ambulated to tub room, requiring one seated rest break, and transferred to TTB. Educated pt on use of TTB, non-skid strips or mat on bottom of tub, grab bars, and long handled shower head. Pt completed UB/LB bathing and dressing while seated on TTB with 2 sit<>stand transfers. Pt ambulated to wheelchair and donned tennis shoes, reporting she never ties the laces and laces are short enough to not be a tripping hazard. Pt stood at sink and completed tooth brushing and appied deodorant. Pt ambulated back to room with no rest breaks. Pt practiced transferring to and from toilet using grab bars and walker, discussed 3-in-1 commode for home use. Discussed pt walking to bathroom with RW every time she needs to go to bathroom, as she reported that nursing staff was using w/c. Changed safety sheet to reflect her ability to safely ambulate to toilet w/ RW with min A to S.  Discussed pt home set-up, d/c planning, plans for child care, and goals for pt. Pt returned to EOB and set-up with breakfast and all needs nearby when leaving.  Session 2: OT Individual Time: 1130-1200 OT Calculated Individual Time (min): 30 min Skilled OT session focusing on ADL retraining, functional ambulation, dynamic standing balance, weight shifting, standing tolerance, and activity tolerance/endurance. Pt seated in w/c when arriving, reporting no pain. Pt reporting needing to use restroom and ambulated to bathroom with RW and transferred to toilet using grab bars. Pt performed hygiene and ambulated to sink to wash hands. Pt ambulated to rehab gym, with 1 rest break. Pt completed dynamic standing balance activities on Biodex, at levels 12 to 6 (static to dynamic, with 1 being most dynamic) using 1 hand for stability and no hands for a few seconds at a time. Pt worked on weight shifting L and R and forward and back, postural stability, and standing tolerance to increase independence with ADLs and IADLs. Pt stood for intervals of 2 min 30 sec to 5 min 30 sec. Pt ambulated back to room and sat in w/c with no rest breaks. Pt seated in w/c with all needs nearby when leaving. Upgraded pt goals to mod I level, as pt is progressing quickly and will need to be at mod I level due to living home alone.  Therapy Documentation Precautions:  Precautions Precautions: Fall Restrictions Weight Bearing Restrictions: No  See FIM for current functional status  Therapy/Group: Individual Therapy  Jill Summers 12/23/2013, 7:15 AM

## 2013-12-23 NOTE — Progress Notes (Signed)
[place] PHYSICAL MEDICINE & REHABILITATION     PROGRESS NOTE    Subjective/Complaints: No new issues. Slept well. Denies current pain. ROS: leg numbness and weakness.  Objective: Vital Signs: Blood pressure 116/69, pulse 74, temperature 98.1 F (36.7 C), temperature source Oral, resp. rate 19, height 5\' 2"  (1.575 m), last menstrual period 12/13/2013, SpO2 100 %. No results found.  Recent Labs  12/22/13 0455  WBC 1.9*  HGB 8.4*  HCT 26.1*  PLT 54*    Recent Labs  12/22/13 0455  NA 143  K 3.5*  CL 106  GLUCOSE 98  BUN 5*  CREATININE 0.61  CALCIUM 8.9   CBG (last 3)  No results for input(s): GLUCAP in the last 72 hours.  Wt Readings from Last 3 Encounters:  12/16/13 97.2 kg (214 lb 4.6 oz)  12/17/13 99.791 kg (220 lb)  10/12/13 107.502 kg (237 lb)    Physical Exam:  Constitutional: She is oriented to person, place, and time. She appears well-developed.  HENT: oral mucosa pink and moist, dentition fair Head: Normocephalic.  Eyes: EOM are normal.  Neck: Normal range of motion. Neck supple. No thyromegaly present.  Cardiovascular: Normal rate and regular rhythm. no murmurs Respiratory: Effort normal and breath sounds normal. No respiratory distress.  GI: Bowel sounds are normal. She exhibits no distension. Non tender   Skin: Skin is warm and dry.  Neuro: Alert and oriented. Good insight and awareness, CN exam intact Motor strength is 5/5 bilateral deltoids, biceps, triceps, grip 3+ to 4-/5 in the hip flexors and, 4/5 knee extensors 4/5 ankle dorsiflexor and plantarflexors.  Sensation reduced to light touch in both legs below the mid thighs, left perhaps involved more than right. Reduced proprioception in both legs as well in same distribution. Can sense gross pain. No sensory findings in upper extremities and no sensory level in abdomen. Deep tendon reflexes 1+ at patella and absent at ankles bilateral lower extremities Psych: normal affect,  behavior. Musc: no edema, no gross pain with ROM/movement  Assessment/Plan: 1. Functional deficits secondary to severe B12 deficiency polyneuropathy  which require 3+ hours per day of interdisciplinary therapy in a comprehensive inpatient rehab setting. Physiatrist is providing close team supervision and 24 hour management of active medical problems listed below. Physiatrist and rehab team continue to assess barriers to discharge/monitor patient progress toward functional and medical goals. FIM: FIM - Bathing Bathing Steps Patient Completed: Chest, Right Arm, Left Arm, Abdomen, Front perineal area, Buttocks, Right upper leg, Left upper leg, Right lower leg (including foot), Left lower leg (including foot) Bathing: 4: Steadying assist  FIM - Upper Body Dressing/Undressing Upper body dressing/undressing steps patient completed: Thread/unthread right bra strap, Thread/unthread left bra strap, Hook/unhook bra, Thread/unthread right sleeve of pullover shirt/dresss, Thread/unthread left sleeve of pullover shirt/dress, Put head through opening of pull over shirt/dress, Pull shirt over trunk Upper body dressing/undressing: 5: Set-up assist to: Obtain clothing/put away FIM - Lower Body Dressing/Undressing Lower body dressing/undressing steps patient completed: Thread/unthread right pants leg, Thread/unthread left pants leg, Pull pants up/down, Don/Doff right sock, Don/Doff left sock Lower body dressing/undressing: 4: Steadying Assist  FIM - Toileting Toileting steps completed by patient: Adjust clothing prior to toileting, Performs perineal hygiene, Adjust clothing after toileting Toileting Assistive Devices: Grab bar or rail for support Toileting: 4: Steadying assist  FIM - Diplomatic Services operational officerToilet Transfers Toilet Transfers Assistive Devices: PsychiatristBedside commode Toilet Transfers: 0-Activity did not occur  FIM - BankerBed/Chair Transfer Bed/Chair Transfer Assistive Devices: Arm rests, Therapist, occupationalWalker Bed/Chair Transfer: 5:  Chair  or W/C > Bed: Supervision (verbal cues/safety issues), 5: Bed > Chair or W/C: Supervision (verbal cues/safety issues)  FIM - Locomotion: Wheelchair Distance: 150 Locomotion: Wheelchair: 5: Travels 150 ft or more: maneuvers on rugs and over door sills with supervision, cueing or coaxing FIM - Locomotion: Ambulation Locomotion: Ambulation Assistive Devices: Designer, industrial/productWalker - Rolling Ambulation/Gait Assistance: 5: Supervision Locomotion: Ambulation: 5: Travels 150 ft or more with supervision/safety issues  Comprehension Comprehension Mode: Auditory Comprehension: 7-Follows complex conversation/direction: With no assist  Expression Expression Mode: Verbal Expression: 7-Expresses complex ideas: With no assist  Social Interaction Social Interaction: 7-Interacts appropriately with others - No medications needed.  Problem Solving Problem Solving: 6-Solves complex problems: With extra time  Memory Memory: 7-Complete Independence: No helper  Medical Problem List and Plan: 1. Functional deficits secondary to severe B12 deficiency with peripheral polyneuropathy. Pt generally debilitated as well 2. DVT Prophylaxis/Anticoagulation: SCDs. Monitor for any signs of DVT 3. Pain Management: Tylenol as needed 4. Mood/depression: Wellbutrin 150 mg daily, Xanax as needed. Provide emotional support\  -appears to be in good spirits at present 5. Neuropsych: This patient is capable of making decisions on her own behalf. 6. Skin/Wound Care: Routine skin checks 7. Fluids/Electrolytes/Nutrition: Strict I and os. Follow-up chemistries tomorrow  -k+ supplement 8. Probable UTI. Urine cultures negative. 9. History of bronchial asthma. Albuterol nebulizer as needed. 10. Pancytopenia. Labs appear to be trending up.   -check serial labs (recheck tomorrow) LOS (Days) 4 A FACE TO FACE EVALUATION WAS PERFORMED  Meshulem Onorato T 12/23/2013 7:36 AM

## 2013-12-23 NOTE — Progress Notes (Signed)
Physical Therapy Session Note  Patient Details  Name: Jill ArthurCandace L Kato MRN: [id number] Date of Birth: 1985-05-22  Today's Date: 12/23/2013 PT Individual Time: 1030-1100 PT Individual Time Calculation (min): 30 min   Short Term Goals: Week 1:  PT Short Term Goal 1 (Week 1): Pt to maintain dynamic standing balance without B UE support and min A PT Short Term Goal 2 (Week 1): Pt to perform SPT bed<>w/c with RW and supervison, 50% of time PT Short Term Goal 3 (Week 1): Pt to propel w/c 200' with B UE and supervision demonstrating fluid and consistent pace PT Short Term Goal 4 (Week 1): Pt to ambulate 100' with RW and superivsion, 50% of time PT Short Term Goal 5 (Week 1): Pt to negotiate up/down 2 steps with RW and min A  Skilled Therapeutic Interventions/Progress Updates:    Pt received seated EOB, agreeable to participate in therapy. Pt ambulated 150' then 5575' during session w/ RW and Min GuardA for safety and w/c follow. Pt's gait characterized by bilateral hyperextension during stance phase, but noted improvement vs yesterday. Session focused on gait trials with devices to address hyperextension. Trialed Ottobock Reaction (on L foot only, correct sized R brace not available) and Large heel wedges bilaterally. Noted improved gait w/ Ott[place]ock AFO on L, noted no significant difference w/ heel wedges. Pt negotiated up/down 3 stairs w/ B rails and MinA for safety. Pt left seated in w/c w/ all needs within reach.   Therapy Documentation Precautions:  Precautions Precautions: Fall Restrictions Weight Bearing Restrictions: No Vital Signs: Therapy Vitals Temp: 98.1 F (36.7 C) Temp Source: Oral Pulse Rate: 74 Resp: 19 BP: 116/69 mmHg Patient Position (if appropriate): Lying Oxygen Therapy SpO2: 100 % O2 Device: Not Delivered Pain:  No/denies pain  See FIM for current functional status  Therapy/Group: Individual Therapy  Hosie SpangleGodfrey, Javarius Tsosie  Hosie SpangleJess Toy Eisemann, PT, DPT 12/23/2013, 7:50 AM

## 2013-12-24 ENCOUNTER — Inpatient Hospital Stay (HOSPITAL_COMMUNITY): Payer: Medicaid Other | Admitting: *Deleted

## 2013-12-24 ENCOUNTER — Encounter (HOSPITAL_COMMUNITY): Payer: Medicaid Other

## 2013-12-24 ENCOUNTER — Inpatient Hospital Stay (HOSPITAL_COMMUNITY): Payer: Medicaid Other | Admitting: Occupational Therapy

## 2013-12-24 ENCOUNTER — Other Ambulatory Visit: Payer: Self-pay | Admitting: Oncology

## 2013-12-24 ENCOUNTER — Inpatient Hospital Stay (HOSPITAL_COMMUNITY): Payer: Medicaid Other

## 2013-12-24 ENCOUNTER — Telehealth: Payer: Self-pay | Admitting: Oncology

## 2013-12-24 ENCOUNTER — Encounter (HOSPITAL_COMMUNITY): Payer: Medicaid Other | Admitting: Occupational Therapy

## 2013-12-24 DIAGNOSIS — F3340 Major depressive disorder, recurrent, in remission, unspecified: Secondary | ICD-10-CM

## 2013-12-24 DIAGNOSIS — E538 Deficiency of other specified B group vitamins: Secondary | ICD-10-CM

## 2013-12-24 LAB — BASIC METABOLIC PANEL
Anion gap: 15 (ref 5–15)
BUN: 4 mg/dL — ABNORMAL LOW (ref 6–23)
CO2: 22 mEq/L (ref 19–32)
Calcium: 8.9 mg/dL (ref 8.4–10.5)
Chloride: 106 mEq/L (ref 96–112)
Creatinine, Ser: 0.66 mg/dL (ref 0.50–1.10)
GFR calc Af Amer: >90 mL/min (ref >90)
GFR calc non Af Amer: >90 mL/min (ref >90)
Glucose, Bld: 101 mg/dL — ABNORMAL HIGH (ref 70–99)
Potassium: 3.7 mEq/L (ref 3.7–5.3)
Sodium: 143 mEq/L (ref 137–147)

## 2013-12-24 LAB — CBC
HCT: 26.9 % — ABNORMAL LOW (ref 36.0–46.0)
Hemoglobin: 8.8 g/dL — ABNORMAL LOW (ref 12.0–15.0)
MCH: 33 pg (ref 26.0–34.0)
MCHC: 32.7 g/dL (ref 30.0–36.0)
MCV: 100.7 fL — ABNORMAL HIGH (ref 78.0–100.0)
Platelets: 189 10*3/uL (ref 150–400)
RBC: 2.67 MIL/uL — ABNORMAL LOW (ref 3.87–5.11)
RDW: 22.4 % — ABNORMAL HIGH (ref 11.5–15.5)
WBC: 3.2 10*3/uL — ABNORMAL LOW (ref 4.0–10.5)

## 2013-12-24 NOTE — Progress Notes (Signed)
Occupational Therapy Session Note  Patient Details  Name: Jill Summers MRN: [id number] Date of Birth: 02/22/1985  Today's Date: 12/24/2013   Short Term Goals: Week 1:  OT Short Term Goal 1 (Week 1): Pt will perform LB dressing with Min A in order to increase I in self care.  OT Short Term Goal 2 (Week 1): Pt will perform shower transfer with Min A in order to increase I in functional transfers. OT Short Term Goal 3 (Week 1): Pt will perform 15 minutes of functional activities without rest break in order to increase endurance for ADL and IADL tasks.  Skilled Therapeutic Interventions/Progress Updates:   Session 1: OT Individual Time: 4098-11910830-0930 OT Calculated Individual Time (min): 60 min Skilled OT session focusing on ADL retraining, functional ambulation, UE strength and ROM, sit<>stand transfers, functional transfers, and activity tolerance/endurance. Pt seated on EOB when arriving with friend leaving when arriving, reporting no pain but stating that she had a cramp in her R calf. Pt ambulated with RW to gather soap and then down hallway towards tub room, requesting to self-propel w/c rest of way due to pain from cramp. Pt transferred to TTB using RW and completed UB/LB bathing and donned gown. Pt transferred to w/c and self-propelled w/c to room. Pt gathered clothing and completed UB/LB dressing while seated in w/c. Pt completed tooth brushing while standing at sink. Completed RLE stretching exercises and provided pt with water to work on relieving cramp in calf. Pt practiced sit<>stand transfers, educated on not hyperextending or locking out knees once standing and working on controlling decent when sitting. Pt requesting to get back to bed and transferred to EOB and then to supine. Pt supine in bed with all needs nearby when leaving.  Session 2: OT Individual Time: 1400-1500 OT Calculated Individual Time (min): 60 min Skilled OT session focusing on IADL retraining, functional  ambulation, community mobility, UE strength and ROM, standing tolerance, and activity tolerance/endurance. Pt seated on EOB when arriving, reporting no pain. Pt ambulated to kitchen with RW and explored cabinets to find out what ingredients will be needed for her meal preparation activity on Friday. Pt able to stand, reach, weight shift, squat, and bend for 5 min 3 sec in kitchen. Pt ambulated to laundry room with 1 rest break and completed simulated laundry activity, placing towels in washing machine, dryer, and standing to fold. Pt stood for 4 min 12 sec without a break during this activity. Pt self-propelled w/c to central elevators and then to gift shop. Pt ambulated through gift shop, practicing side stepping, turning in tight spaces, and walking backwards (standing for 5 min 30 sec total). Pt self-propelled w/c back to elevators and then to room with no rest breaks. Pt completed LB and dynamic standing exercise of squats (3 sets of 10), requiring min A for balance. Pt transferred to EOB and then to supine. Pt supine in bed with all needs nearby when leaving. Pt with increased activity and standing tolerance this session for functional activities.   Therapy Documentation Precautions:  Precautions Precautions: Fall Restrictions Weight Bearing Restrictions: No  See FIM for current functional status  Therapy/Group: Individual Therapy  Gerron Guidotti [name] 12/24/2013, 7:19 AM

## 2013-12-24 NOTE — Plan of Care (Signed)
Problem: RH PAIN MANAGEMENT Goal: RH STG PAIN MANAGED AT OR BELOW PT'S PAIN GOAL <2 on a 0-10 scale  Outcome: Progressing     

## 2013-12-24 NOTE — Progress Notes (Signed)
[place] PHYSICAL MEDICINE & REHABILITATION     PROGRESS NOTE    Subjective/Complaints: Has questions about spinal cord. Denies pain. Making nice progress with therapy.  ROS: leg numbness and weakness.  Objective: Vital Signs: Blood pressure 103/50, pulse 72, temperature 98.1 F (36.7 C), temperature source Oral, resp. rate 20, height 5\' 2"  (1.575 m), last menstrual period 12/13/2013, SpO2 100 %. No results found.  Recent Labs  12/22/13 0455  WBC 1.9*  HGB 8.4*  HCT 26.1*  PLT 54*    Recent Labs  12/22/13 0455  NA 143  K 3.5*  CL 106  GLUCOSE 98  BUN 5*  CREATININE 0.61  CALCIUM 8.9   CBG (last 3)  No results for input(s): GLUCAP in the last 72 hours.  Wt Readings from Last 3 Encounters:  12/16/13 97.2 kg (214 lb 4.6 oz)  12/17/13 99.791 kg (220 lb)  10/12/13 107.502 kg (237 lb)    Physical Exam:  Constitutional: She is oriented to person, place, and time. She appears well-developed.  HENT: oral mucosa pink and moist, dentition fair Head: Normocephalic.  Eyes: EOM are normal.  Neck: Normal range of motion. Neck supple. No thyromegaly present.  Cardiovascular: Normal rate and regular rhythm. no murmurs Respiratory: Effort normal and breath sounds normal. No respiratory distress.  GI: Bowel sounds are normal. She exhibits no distension. Non tender   Skin: Skin is warm and dry.  Neuro: Alert and oriented. Good insight and awareness, CN exam intact Motor strength is 5/5 bilateral deltoids, biceps, triceps, grip 3+ to 4-/5 in the hip flexors and, 4/5 knee extensors 4/5 ankle dorsiflexor and plantarflexors.  Sensation reduced to light touch in both legs below the mid thighs, left perhaps involved more than right. Reduced proprioception in both legs as well in same distribution. Can sense gross pain. No sensory findings in upper extremities and no sensory level in abdomen. Deep tendon reflexes 1+ at patella and absent at ankles bilateral lower  extremities Psych: normal affect, behavior. Musc: no edema, no gross pain with ROM/movement  Assessment/Plan: 1. Functional deficits secondary to severe B12 deficiency polyneuropathy  which require 3+ hours per day of interdisciplinary therapy in a comprehensive inpatient rehab setting. Physiatrist is providing close team supervision and 24 hour management of active medical problems listed below. Physiatrist and rehab team continue to assess barriers to discharge/monitor patient progress toward functional and medical goals.  I spent extensive time with the patient this morning reviewing her disease process and expected recovery. The patient expressed an understanding.   FIM: FIM - Bathing Bathing Steps Patient Completed: Chest, Right Arm, Left Arm, Abdomen, Front perineal area, Buttocks, Right upper leg, Left upper leg, Right lower leg (including foot), Left lower leg (including foot) Bathing: 4: Steadying assist  FIM - Upper Body Dressing/Undressing Upper body dressing/undressing steps patient completed: Thread/unthread right bra strap, Thread/unthread left bra strap, Hook/unhook bra, Thread/unthread right sleeve of pullover shirt/dresss, Thread/unthread left sleeve of pullover shirt/dress, Put head through opening of pull over shirt/dress, Pull shirt over trunk Upper body dressing/undressing: 5: Supervision: Safety issues/verbal cues FIM - Lower Body Dressing/Undressing Lower body dressing/undressing steps patient completed: Thread/unthread right pants leg, Thread/unthread left pants leg, Pull pants up/down, Don/Doff right sock, Don/Doff left sock, Fasten/unfasten right shoe, Don/Doff left shoe, Fasten/unfasten left shoe, Don/Doff right shoe Lower body dressing/undressing: 4: Steadying Assist  FIM - Toileting Toileting steps completed by patient: Adjust clothing prior to toileting, Performs perineal hygiene, Adjust clothing after toileting Toileting Assistive Devices: Grab bar or  rail for  support Toileting: 5: Supervision: Safety issues/verbal cues  FIM - Diplomatic Services operational officerToilet Transfers Toilet Transfers Assistive Devices: Bedside commode Toilet Transfers: 5-To toilet/BSC: Supervision (verbal cues/safety issues), 5-From toilet/BSC: Supervision (verbal cues/safety issues)  FIM - BankerBed/Chair Transfer Bed/Chair Transfer Assistive Devices: Therapist, occupationalWalker Bed/Chair Transfer: 6: Supine > Sit: No assist, 5: Bed > Chair or W/C: Supervision (verbal cues/safety issues)  FIM - Locomotion: Wheelchair Distance: 150 Locomotion: Wheelchair: 0: Activity did not occur FIM - Locomotion: Ambulation Locomotion: Ambulation Assistive Devices: Designer, industrial/productWalker - Rolling Ambulation/Gait Assistance: 4: Min guard, 5: Supervision Locomotion: Ambulation: 4: Travels 150 ft or more with minimal assistance (Pt.>75%)  Comprehension Comprehension Mode: Auditory Comprehension: 7-Follows complex conversation/direction: With no assist  Expression Expression Mode: Verbal Expression: 7-Expresses complex ideas: With no assist  Social Interaction Social Interaction: 7-Interacts appropriately with others - No medications needed.  Problem Solving Problem Solving: 7-Solves complex problems: Recognizes & self-corrects  Memory Memory: 7-Complete Independence: No helper  Medical Problem List and Plan: 1. Functional deficits secondary to severe B12 deficiency with peripheral polyneuropathy. Pt generally debilitated as well 2. DVT Prophylaxis/Anticoagulation: SCDs. Monitor for any signs of DVT 3. Pain Management: Tylenol as needed 4. Mood/depression: Wellbutrin 150 mg daily, Xanax as needed. Provide emotional support\  -appears to be in good spirits at present  -neuropsych eval 5. Neuropsych: This patient is capable of making decisions on her own behalf. 6. Skin/Wound Care: Routine skin checks 7. Fluids/Electrolytes/Nutrition: Strict I and os. Follow-up chemistries tomorrow  -k+ supplement 8. Probable UTI. Urine cultures negative. 9.  History of bronchial asthma. Albuterol nebulizer as needed. 10. Pancytopenia. Labs appear to be trending up.   -cbc pending LOS (Days) 5 A FACE TO FACE EVALUATION WAS PERFORMED  Claudis Giovanelli T 12/24/2013 8:01 AM

## 2013-12-24 NOTE — Progress Notes (Signed)
NUTRITION FOLLOW UP  DOCUMENTATION CODES Per approved criteria  -Morbid Obesity   INTERVENTION: -Resource Breeze po TID, each supplement provides 250 kcal and 9 grams of protein  NUTRITION DIAGNOSIS: Inadequate oral intake related to altered taste perception as evidenced by PO: 25%; ongoing  Goal: Pt will meet >90% of estimated nutritional needs; progressing  Monitor:  PO/supplement intake, labs, weight changes, I/O's  28 y.o. female  Admitting Dx: <principal problem not specified>  Jill Summers was admitted today to Inpatient Rehabilitation with the diagnosis of b12 polyneuropathy.   ASSESSMENT: Pt reports she still has taste aversion. Meal completion is 25-60%. Pt reports she has been drinking her Ray[employer]ource Breeze and likes it. Pt was encouraged to eat her food at meals and to drink her supplements.   Height: Ht Readings from Last 1 Encounters:  12/19/13 5\' 2"  (1.575 m)    Weight: Wt Readings from Last 1 Encounters:  12/16/13 214 lb 4.6 oz (97.2 kg)    BMI:  Estimated body mass index is 40.23 kg/(m^2) as calculated from the following:   Height as of this encounter: 5\' 2"  (1.575 m).   Weight as of 12/16/13: 220 lb (99.791 kg). Extreme obesity, class III.   Re-Estimated Nutritional Needs: Kcal: 1900-2100 Protein: 107-117 grams Fluid: 1.9-2.1 L  Skin: Intact  Diet Order: Diet regular   Intake/Output Summary (Last 24 hours) at 12/24/13 1007 Last data filed at 12/23/13 1800  Gross per 24 hour  Intake    360 ml  Output      0 ml  Net    360 ml    Last BM: 12/22/13  Labs:   Recent Labs Lab 12/18/13 1522 12/22/13 0455 12/24/13 0718  NA 140 143 143  K 3.7 3.5* 3.7  CL 105 106 106  CO2 22 21 22   BUN 6 5* 4*  CREATININE 0.71 0.61 0.66  CALCIUM 8.7 8.9 8.9  GLUCOSE 96 98 101*    CBG (last 3)  No results for input(s): GLUCAP in the last 72 hours.  Scheduled Meds: . buPROPion  150 mg Oral Daily  . feeding supplement (RESOURCE BREEZE)  1  Container Oral TID BM  . hydrocerin   Topical BID  . magic mouthwash w/lidocaine  10 mL Oral QID    Continuous Infusions:   Past Medical History  Diagnosis Date  . Asthma   . Obesity   . Adopted   . Depression   . Chlamydia   . Bacterial vaginosis   . Urinary tract infection   . MRSA infection     Past Surgical History  Procedure Laterality Date  . Cesarean section    . Cesarean section with bilateral tubal ligation     [name], MS, RD, LDN Pager # [phone] After hours/ weekend pager # [phone]

## 2013-12-24 NOTE — Plan of Care (Signed)
Problem: RH BLADDER ELIMINATION Goal: RH STG MANAGE BLADDER WITH EQUIPMENT WITH ASSISTANCE STG Manage Bladder With Equipment With min Assistance  Outcome: Progressing  Problem: RH SKIN INTEGRITY Goal: RH STG SKIN FREE OF INFECTION/BREAKDOWN Remain free from infection and breakdown while on rehab with min assist  Outcome: Progressing Goal: RH STG MAINTAIN SKIN INTEGRITY WITH ASSISTANCE STG Maintain Skin Integrity With min Assistance.  Outcome: Progressing  Problem: RH SAFETY Goal: RH STG ADHERE TO SAFETY PRECAUTIONS W/ASSISTANCE/DEVICE STG Adhere to Safety Precautions With min Assistance/Device.  Outcome: Progressing Goal: RH STG DECREASED RISK OF FALL WITH ASSISTANCE STG Decreased Risk of Fall With min Assistance.  Outcome: Progressing  Problem: RH PAIN MANAGEMENT Goal: RH STG PAIN MANAGED AT OR BELOW PT'S PAIN GOAL <2 on a 0-10 scale  Outcome: Progressing

## 2013-12-24 NOTE — Progress Notes (Signed)
Physical Therapy Session Note  Patient Details  Name: Jill Summers MRN: [id number] Date of Birth: [birth year]  Today's Date: 12/24/2013 PT Individual Time: 1300-1400 PT Individual Time Calculation (min): 60 min   Short Term Goals: Week 1:  PT Short Term Goal 1 (Week 1): Pt to maintain dynamic standing balance without B UE support and min A PT Short Term Goal 2 (Week 1): Pt to perform SPT bed<>w/c with RW and supervison, 50% of time PT Short Term Goal 3 (Week 1): Pt to propel w/c 200' with B UE and supervision demonstrating fluid and consistent pace PT Short Term Goal 4 (Week 1): Pt to ambulate 100' with RW and superivsion, 50% of time PT Short Term Goal 5 (Week 1): Pt to negotiate up/down 2 steps with RW and min A  Skilled Therapeutic Interventions/Progress Updates:    Denies pain. Session focused on functional gait with RW (overall S to and from gym), dynamic gait through obstacle course to simulate home environment mobility (better carryover from cues from yesterdays session to stay inside RW during turning), simulated car transfer to truck height with S overall using RW, neuro re-ed for balance reactions on compliant surface while tossing, bouncing and catching a ball (min A overall) and neuro re-ed for gait without AD in parallel bars (pt refused to attempt in open space even with +2) required min A from therapist and pt provided intermittent A off of parallel bars for support and balance (forwards, backwards and side stepping), and reviewed floor transfers and what to do in case of fall/emergency (recommended to carry cell phone, have a family member keep an extra house key to her place). Pt able to perform floor transfers at overall min A initially and S on second attempt with pt independently recalling what to do in case of a fall. Overall demonstrating improved functional mobility with use of RW but continues to require cues for safe use of RW during transfers and mobility and to attend  to foot placement which is impaired due to decreased proprioception and sensation.   Therapy Documentation Precautions:  Precautions Precautions: Fall Restrictions Weight Bearing Restrictions: No    See FIM for current functional status  Therapy/Group: Individual Therapy  [name], [facility] 12/24/2013, 2:54 PM

## 2013-12-24 NOTE — Patient Care Conference (Signed)
Inpatient RehabilitationTeam Conference and Plan of Care Update Date: 12/23/2013   Time: 2:30 PM    Patient Name: Jill ArthurCandace L Figuereo      Medical Record Number: [id number]  Date of Birth: 07-21-1985 Sex: Female         Room/Bed: 4M10C/4M10C-01 Payor Info: Payor: MEDICAID [place] / Plan: MEDICAID [place] ACCESS / Product Type: *No Product type* /    Admitting Diagnosis: Vit B12 def  paraplegia  Admit Date/Time:  12/19/2013  4:54 PM Admission Comments: No comment available   Primary Diagnosis:  <principal problem not specified> Principal Problem: <principal problem not specified>  Patient Active Problem List   Diagnosis Date Noted  . Paraplegia 12/19/2013  . Polyneuropathy due to vitamin B deficiency 12/19/2013  . Vitamin B 12 deficiency 12/18/2013  . Leg weakness, bilateral   . Protein-calorie malnutrition, severe 12/17/2013  . Vertigo 12/16/2013  . Fever 12/16/2013  . Lower extremity weakness 12/16/2013  . Dizziness   . Pancytopenia 12/15/2013  . MRSA (methicillin resistant staph aureus) culture positive 07/27/2013    Expected Discharge Date: Expected Discharge Date: 12/30/13  Team Members Present: Physician leading conference: Dr. Faith RogueZachary Swartz Social Worker Present: Amada JupiterLucy Jacere Pangborn, LCSW Nurse Present: Carlean PurlMaryann Barbour, RN PT Present: Karolee StampsAlison Gray, PT OT Present: Roney MansJennifer Smith, OT;Patricia Mat Carnelay, OT PPS Coordinator present : Tora DuckMarie Noel, RN, CRRN     Current Status/Progress Goal Weekly Team Focus  Medical   b12 neuropathy due to malnutrition. still some pain issues. sensory greater than motor deficits.   improve functional activity tolerance  nutrition, bp, pain control   Bowel/Bladder   Cont of bowel and bladder, LBM 11/23  Manage bowel and bladder MOD I  Remain cont of bowel and bladder   Swallow/Nutrition/ Hydration             ADL's   overall min A with ADLs and functional transfers  overall S   ADL retraining, functional ambulation and transfers, dynamic standing  balance, safety w/ RW, activity tolerance/endurance   Mobility   min A overall with transfers and gait; min to mod A for stairs  mod I to supervision with RW for household distances; S stairs and car  neuro re-ed to BLE; strengthening; endurance; gait training; balance; floor transfers   Communication             Safety/Cognition/ Behavioral Observations            Pain   no c/o pain  pain level 0  Assess pain q shift and PRN   Skin   Skin dry and intact, Eucerin cream scheduled  No new skin breakdown while on rehab  Assess skin q shift and PRN    Rehab Goals Patient on target to meet rehab goals: Yes *See Care Plan and progress notes for long and short-term goals.  Barriers to Discharge: sensory deficits    Possible Resolutions to Barriers:  mod I goals, improved activity tolerance    Discharge Planning/Teaching Needs:  home with intermittent assist of family - pt notes she could have 24/7 if needed.      Team Discussion:  Recovering well overall.  Strength returning and anticipate reaching mod i goals.  Currently min assist and issues with knee buckling.  Pt requesting more information about medical issues and recovery expectations.  MD to follow up.  Revisions to Treatment Plan:  Will add neuropsych for coping    Continued Need for Acute Rehabilitation Level of Care: The patient requires daily medical management by a physician  with specialized training in physical medicine and rehabilitation for the following conditions: Daily direction of a multidisciplinary physical rehabilitation program to ensure safe treatment while eliciting the highest outcome that is of practical value to the patient.: Yes Daily medical management of patient stability for increased activity during participation in an intensive rehabilitation regime.: Yes Daily analysis of laboratory values and/or radiology reports with any subsequent need for medication adjustment of medical intervention for :  Neurological problems;Other  Robb Sibal 12/24/2013, 11:58 AM

## 2013-12-24 NOTE — Progress Notes (Signed)
Social Work Patient ID: Jill Summers, female   DOB: 1985/11/20, 28 y.o.   MRN: [id number]   Met yesterday afternoon with pt to review team conference.  She is aware and agreeable with targeted d/c date of 12/1.  She is also pleased with progress.  Plans to review her medical questions with MD in the morning.  Very agreeable with meeting with neuropsych as well since she has not seen her own counselor for several months prior to admit.  Will follow up with her again on my return 11/27.  Stetson Pelaez, LCSW

## 2013-12-24 NOTE — Progress Notes (Addendum)
Physical Therapy Session Note  Patient Details  Name: Jill Summers MRN: [id number] Date of Birth: 08/02/1985  Today's Date: 12/24/2013 PT Individual Time: 1131-1203 PT Individual Time Calculation (min): 32 min   Short Term Goals: Week 1:  PT Short Term Goal 1 (Week 1): Pt to maintain dynamic standing balance without B UE support and min A PT Short Term Goal 2 (Week 1): Pt to perform SPT bed<>w/c with RW and supervison, 50% of time PT Short Term Goal 3 (Week 1): Pt to propel w/c 200' with B UE and supervision demonstrating fluid and consistent pace PT Short Term Goal 4 (Week 1): Pt to ambulate 100' with RW and superivsion, 50% of time PT Short Term Goal 5 (Week 1): Pt to negotiate up/down 2 steps with RW and min A    Skilled Therapeutic Interventions/Progress Updates:   Pt sitting up in bed, waiting for PT to arrive and turn off bed alarm.  She donned shoes in sitting modified independently, although she chooses not to tie them.  Basic transfer with RW, close supervision.  Therapeutic exercise performed with LE to increase strength for functional mobility: standing [place] A exs: bil toe raises, heel raises, R and L hip abduction, mini squats, 10 x 1 each with min guard assist due to limitations with knee control bil.  Gait with RW x 75' x 2 with RW with supervision/min guard assist. Advanced gait with RW sidestepping R and L each x 15' and backwards x 35'.  No knee buckling during gait, although noted difficulty grading knee extension with hyperextension intermittently,  L> RLE.  Pt required cues for safe hand placement sit>< stand several times. PT DOE 2/4 with advanced gait.  Pt modified independent propelling w/c hall>< room.  Pt remained sitting in w/c for lunch.  All needs left within reach.    Therapy Documentation Precautions:  Precautions Precautions: Fall Restrictions Weight Bearing Restrictions: No Pain: Pain Assessment Pain Assessment: No/denies pain   Locomotion  : Ambulation Ambulation/Gait Assistance: 4: Min guard;5: Supervision Wheelchair Mobility Distance: 50     See FIM for current functional status  Therapy/Group: Individual Therapy  Alvey Brockel 12/24/2013, 12:11 PM

## 2013-12-24 NOTE — Telephone Encounter (Signed)
per pof to sch pt appt-cld and left Tiffany vm to call pt [facility] Gave MRN # and DOB sent staff message as wel

## 2013-12-25 NOTE — Progress Notes (Signed)
[place] PHYSICAL MEDICINE & REHABILITATION     PROGRESS NOTE    Subjective/Complaints: Had another good day. Slept well. Denies pain.  ROS: leg numbness still present.  Objective: Vital Signs: Blood pressure 98/51, pulse 60, temperature 98.3 F (36.8 C), temperature source Oral, resp. rate 20, height 5\' 2"  (1.575 m), weight 95.21 kg (209 lb 14.4 oz), last menstrual period 12/13/2013, SpO2 100 %. No results found.  Recent Labs  12/24/13 0718  WBC 3.2*  HGB 8.8*  HCT 26.9*  PLT 189    Recent Labs  12/24/13 0718  NA 143  K 3.7  CL 106  GLUCOSE 101*  BUN 4*  CREATININE 0.66  CALCIUM 8.9   CBG (last 3)  No results for input(s): GLUCAP in the last 72 hours.  Wt Readings from Last 3 Encounters:  12/24/13 95.21 kg (209 lb 14.4 oz)  12/16/13 97.2 kg (214 lb 4.6 oz)  12/17/13 99.791 kg (220 lb)    Physical Exam:  Constitutional: She is oriented to person, place, and time. She appears well-developed.  HENT: oral mucosa pink and moist, dentition fair Head: Normocephalic.  Eyes: EOM are normal.  Neck: Normal range of motion. Neck supple. No thyromegaly present.  Cardiovascular: Normal rate and regular rhythm. no murmurs Respiratory: Effort normal and breath sounds normal. No respiratory distress.  GI: Bowel sounds are normal. She exhibits no distension. Non tender   Skin: Skin is warm and dry.  Neuro: Alert and oriented. Good insight and awareness, CN exam intact Motor strength is 5/5 bilateral deltoids, biceps, triceps, grip 3+ to 4-/5 in the hip flexors and, 4/5 knee extensors 4/5 ankle dorsiflexor and plantarflexors.  Sensation reduced to light touch in both legs below the mid thighs, left perhaps involved more than right. Reduced proprioception in both legs as well in same distribution. Can sense gross pain. No sensory findings in upper extremities and no sensory level in abdomen. Deep tendon reflexes 1+ at patella and absent at ankles bilateral lower  extremities Psych: normal affect, behavior. Musc: no edema, no gross pain with ROM/movement  Assessment/Plan: 1. Functional deficits secondary to severe B12 deficiency polyneuropathy  which require 3+ hours per day of interdisciplinary therapy in a comprehensive inpatient rehab setting. Physiatrist is providing close team supervision and 24 hour management of active medical problems listed below. Physiatrist and rehab team continue to assess barriers to discharge/monitor patient progress toward functional and medical goals.      FIM: FIM - Bathing Bathing Steps Patient Completed: Chest, Right Arm, Left Arm, Abdomen, Front perineal area, Buttocks, Right upper leg, Left upper leg, Right lower leg (including foot), Left lower leg (including foot) Bathing: 4: Steadying assist  FIM - Upper Body Dressing/Undressing Upper body dressing/undressing steps patient completed: Thread/unthread right bra strap, Thread/unthread left bra strap, Hook/unhook bra, Thread/unthread right sleeve of pullover shirt/dresss, Thread/unthread left sleeve of pullover shirt/dress, Put head through opening of pull over shirt/dress, Pull shirt over trunk Upper body dressing/undressing: 5: Supervision: Safety issues/verbal cues FIM - Lower Body Dressing/Undressing Lower body dressing/undressing steps patient completed: Thread/unthread right pants leg, Thread/unthread left pants leg, Pull pants up/down, Don/Doff right sock, Don/Doff left sock, Fasten/unfasten right shoe, Don/Doff left shoe, Fasten/unfasten left shoe, Don/Doff right shoe Lower body dressing/undressing: 5: Supervision: Safety issues/verbal cues  FIM - Toileting Toileting steps completed by patient: Adjust clothing prior to toileting, Performs perineal hygiene, Adjust clothing after toileting Toileting Assistive Devices: Grab bar or rail for support Toileting: 0: Activity did not occur  FIM -  Diplomatic Services operational officerToilet Transfers Toilet Transfers Assistive Devices: PhotographerBedside  commode Toilet Transfers: 0-Activity did not occur  FIM - BankerBed/Chair Transfer Bed/Chair Transfer Assistive Devices: Therapist, occupationalWalker Bed/Chair Transfer: 5: Bed > Chair or W/C: Supervision (verbal cues/safety issues), 5: Chair or W/C > Bed: Supervision (verbal cues/safety issues)  FIM - Locomotion: Wheelchair Distance: 50 Locomotion: Wheelchair: 6: Travels 150 ft or more, turns around, maneuvers to table, bed or toilet, negotiates 3% grade: maneuvers on rugs and over door sills independently FIM - Locomotion: Ambulation Locomotion: Ambulation Assistive Devices: Designer, industrial/productWalker - Rolling Ambulation/Gait Assistance: 5: Supervision Locomotion: Ambulation: 5: Travels 150 ft or more with supervision/safety issues  Comprehension Comprehension Mode: Auditory Comprehension: 7-Follows complex conversation/direction: With no assist  Expression Expression Mode: Verbal Expression: 7-Expresses complex ideas: With no assist  Social Interaction Social Interaction: 7-Interacts appropriately with others - No medications needed.  Problem Solving Problem Solving: 7-Solves complex problems: Recognizes & self-corrects  Memory Memory: 7-Complete Independence: No helper  Medical Problem List and Plan: 1. Functional deficits secondary to severe B12 deficiency with peripheral polyneuropathy. Pt generally debilitated as well 2. DVT Prophylaxis/Anticoagulation: SCDs. Monitor for any signs of DVT 3. Pain Management: Tylenol as needed 4. Mood/depression: Wellbutrin 150 mg daily, Xanax as needed. Provide emotional support\  -appears to be in good spirits at present  -neuropsych eval pending 5. Neuropsych: This patient is capable of making decisions on her own behalf. 6. Skin/Wound Care: Routine skin checks 7. Fluids/Electrolytes/Nutrition: Strict I and os.    -k+ supplement 8. Probable UTI. Urine cultures negative. 9. History of bronchial asthma. Albuterol nebulizer as needed. 10. Pancytopenia. Labs appear to be trending  up.   -re-check cbc Friday LOS (Days) 6 A FACE TO FACE EVALUATION WAS PERFORMED  Michial Disney T 12/25/2013 7:42 AM

## 2013-12-25 NOTE — Plan of Care (Signed)
Problem: RH BOWEL ELIMINATION Goal: RH STG MANAGE BOWEL WITH ASSISTANCE STG Manage Bowel with min Assistance.  Outcome: Progressing Goal: RH STG MANAGE BOWEL W/MEDICATION W/ASSISTANCE STG Manage Bowel with Medication with min Assistance.  Outcome: Progressing  Problem: RH BLADDER ELIMINATION Goal: RH STG MANAGE BLADDER WITH ASSISTANCE STG Manage Bladder With min Assistance  Outcome: Progressing  Problem: RH SKIN INTEGRITY Goal: RH STG SKIN FREE OF INFECTION/BREAKDOWN Remain free from infection and breakdown while on rehab with min assist  Outcome: Progressing Goal: RH STG MAINTAIN SKIN INTEGRITY WITH ASSISTANCE STG Maintain Skin Integrity With min Assistance.  Outcome: Progressing  Problem: RH SAFETY Goal: RH STG ADHERE TO SAFETY PRECAUTIONS W/ASSISTANCE/DEVICE STG Adhere to Safety Precautions With min Assistance/Device.  Outcome: Progressing Goal: RH STG DECREASED RISK OF FALL WITH ASSISTANCE STG Decreased Risk of Fall With min Assistance.  Outcome: Progressing  Problem: RH PAIN MANAGEMENT Goal: RH STG PAIN MANAGED AT OR BELOW PT'S PAIN GOAL <2 on a 0-10 scale  Outcome: Progressing     

## 2013-12-26 ENCOUNTER — Encounter (HOSPITAL_COMMUNITY): Payer: Medicaid Other | Admitting: Occupational Therapy

## 2013-12-26 ENCOUNTER — Inpatient Hospital Stay (HOSPITAL_COMMUNITY): Payer: Medicaid Other

## 2013-12-26 ENCOUNTER — Inpatient Hospital Stay (HOSPITAL_COMMUNITY): Payer: Medicaid Other | Admitting: Occupational Therapy

## 2013-12-26 DIAGNOSIS — F3340 Major depressive disorder, recurrent, in remission, unspecified: Secondary | ICD-10-CM | POA: Insufficient documentation

## 2013-12-26 LAB — CBC
HCT: 28.1 % — ABNORMAL LOW (ref 36.0–46.0)
Hemoglobin: 9.1 g/dL — ABNORMAL LOW (ref 12.0–15.0)
MCH: 33.2 pg (ref 26.0–34.0)
MCHC: 32.4 g/dL (ref 30.0–36.0)
MCV: 102.6 fL — ABNORMAL HIGH (ref 78.0–100.0)
Platelets: 341 10*3/uL (ref 150–400)
RBC: 2.74 MIL/uL — ABNORMAL LOW (ref 3.87–5.11)
RDW: 21.2 % — ABNORMAL HIGH (ref 11.5–15.5)
WBC: 4 10*3/uL (ref 4.0–10.5)

## 2013-12-26 LAB — ANTI-PARIETAL ANTIBODY: Parietal Cell Antibody-IgG: NEGATIVE

## 2013-12-26 NOTE — Plan of Care (Signed)
Problem: RH BOWEL ELIMINATION Goal: RH STG MANAGE BOWEL WITH ASSISTANCE STG Manage Bowel with min Assistance.  Outcome: Not Progressing Goal: RH STG MANAGE BOWEL W/MEDICATION W/ASSISTANCE STG Manage Bowel with Medication with min Assistance.  Outcome: Not Progressing

## 2013-12-26 NOTE — Consult Note (Signed)
INITIAL DIAGNOSTIC EXAMINATION - [facility]   [name] is a 28 year old, right-handed woman, who was seen for an initial diagnostic examination to evaluate her mood.  According to her medical record, she has a history of previously untreated B12 deficiency.  She was admitted on 12/16/13 with bouts of dizziness over the past 2 weeks as well as lower extremity weakness and numbness.  Cranial CT was negative.  MRI of the lumbar and cervical spine demonstrated multiple bilateral thoracic cord lesions in the lateral white matter most compatible with demyelinating illness of questionable etiology.  Neurology was consulted and felt as though lesions were most likely secondary to B12 deficiency.  MRI of the brain performed on 12/18/13 demonstrated white matter hyperintensities in an atypical distribution for classic demyelination.  Due to abnormal bloodwork, she underwent transfusion.  The etiology of her demyelination remains unclear.    During the clinical interview, [name] described a history of what sound like major depressive episodes, lasting for up to several months at a time, during which she does not leave her bed unless absolutely necessary, experiences extreme anhedonia, low energy, low motivation, and isolates herself.  Despite multiple symptoms of major depression during these times, she denied any history of suicidal ideation, intent, or plan.  She also described significant hyperactivity, inattention, and impulsivity as a child, labeling herself as "wild."  She stated that those symptoms seem to have abated in adulthood and she denied manic symptoms.  Currently, she denied experiencing intense depressive symptoms.  Still, she described frustration with the slow speed of recovery and with not knowing exactly what caused her illness.  She also commented that being on the rehabilitation unit has helped to elevate her mood because she has more "freedom"  than she did on her previous unit.  [name] stated that it has been helpful to see her physical progress.  She said that in order to cope, she takes one day at a time and talks to her mother.  [name] said that she has a lot of support and denied having major concerns or struggles at this time.  Her responses to a self-report measure of mood symptoms were not suggestive of the presence of clinically significant depression at this time.    It is notable that interpersonally, [name]' interaction with the neuropsychologist seemed somewhat disconnected.  She described a history of distrust of others and mentioned that she often questions the motives of others (e.g. if she asks them for help, they will want something in return).  This could make certain inpatient interactions with staff members difficult, as she may appear angry or close herself off during sessions with her therapists and other staff members.  It seemed as though this was a longstanding interactional pattern for her and should not be taken personally.  It may be best for her therapists to ask her directly if they get the sense that she is distrustful of them.  For example, if they feel as though she is angry with them or not wanting to engage in therapy, it may be helpful to ask if she is feeling angry or not wanting to engage.  Given her interaction today, it is likely that she will respond honestly, which could help open a dialogue to prevent any behavioral symptoms from adversely impacting treatment.    Time during the current examination was spent in helping [name] to identify coping mechanisms and in exploring her reactions in various situations she  has encountered.  [name] commented that she plans to return to working with her prior psychiatrist post-discharge, but does not wish to resume individual psychotherapy, as she commented that she has been in psychotherapy for most of her life and feels as though she has gained  from it what she can.  At this time, [name] appears to be coping well with her situation and likely does not require medication adjustments for mood enhancement.  However, a follow-up consultation could be provided, should her treatment team feel that it would be beneficial in informing care.    DIAGNOSES: B12 deficiency Major Depressive Disorder, recurrent, in remission  Jill CellaKaren America Sandall, PsyD Clinical Neuropsychologist

## 2013-12-26 NOTE — Progress Notes (Signed)
Social Work  Social Work Assessment and Plan  Patient Details  Name: Jill Summers MRN: [id number] Date of Birth: 04-03-1985  Today's Date: 12/22/2013  Problem List:  Patient Active Problem List   Diagnosis Date Noted  . Major depressive disorder, recurrent, in remission   . Paraplegia 12/19/2013  . Polyneuropathy due to vitamin B deficiency 12/19/2013  . Vitamin B 12 deficiency 12/18/2013  . Leg weakness, bilateral   . Protein-calorie malnutrition, severe 12/17/2013  . Vertigo 12/16/2013  . Fever 12/16/2013  . Lower extremity weakness 12/16/2013  . Dizziness   . Pancytopenia 12/15/2013  . MRSA (methicillin resistant staph aureus) culture positive 07/27/2013   Past Medical History:  Past Medical History  Diagnosis Date  . Asthma   . Obesity   . Adopted   . Depression   . Chlamydia   . Bacterial vaginosis   . Urinary tract infection   . MRSA infection    Past Surgical History:  Past Surgical History  Procedure Laterality Date  . Cesarean section    . Cesarean section with bilateral tubal ligation     Social History:  reports that she has never smoked. She has never used smokeless tobacco. She reports that she does not drink alcohol or use illicit drugs.  Family / Support Systems Marital Status: Single Patient Roles: Parent Children: pt has two sons, ages 353 and 6347yrs who are currently both staying with her mother. Other Supports: mother, Jill Summers @ [phone](C) [phone];  sister, Jill Summers @ [phone](C) [phone] (sister is a Consulting civil engineerstudent at [employer] but will be home over holiday breaks and can assist prn.) Anticipated Caregiver: self, mom and a friend Ability/Limitations of Caregiver: Mom is retired and lives next door.  Mom helps out with patient's son.  Sister comes home on Wednesdays.  She can call on her friend Jill Summers as well. Caregiver Availability: Intermittent Family Dynamics: Pt describes a close, supportive relationship with her mother and sister.  Denies any  concerns about their willingness to assist at d/c.  Social History Preferred language: English Religion: Baptist Cultural Background: NA Education: HS grad Read: Yes Write: Yes Employment Status: Unemployed Fish farm managerLegal Hisotry/Current Legal Issues: none Guardian/Conservator: none - per MD, pt capable of making decisions on her own behalf   Abuse/Neglect Physical Abuse: Denies Verbal Abuse: Denies Sexual Abuse: Denies Exploitation of patient/patient's resources: Denies Self-Neglect: Denies  Emotional Status Pt's affect, behavior adn adjustment status: Pt very pleasant, soft-spoken but openly discusses her current coping issues and those prior to admission.  She denies any current s/s of significant emotional distress.  States, "I feel like I'm finally getting Jill Summers back."  When asked who "Jill Summers" is, she states, "...somebody who is laughing and getting out of her home." Recent Psychosocial Issues: Pt reports that she has been "very depressed" for approx 4 months PTA and "literally just laying in the bed all day and night.Marland Kitchen.Marland Kitchen.I think that's what caused this...".  She acknowledges that she places a burden on her mother to care for her two children "when I'm like that." Pyschiatric History: Pt with diagnosed Major Depression and [facility]  Usually counseling and med check 1x/mo, however, [facility] or taking her anti-depressant (Wellbutrin).  No h/o any inpatient hospitalization for derpession.  Patient / Family Perceptions, Expectations & Goals Pt/Family understanding of illness & functional limitations: Pt reports that she and her family "kind of understand what's going on", however, she would like more clarification from MDs.  Able to report that she knows she has a significant B12 deficiency and "will be taking B12 shots for the rest of my life".  Also aware that  scans did show "lesions on my spine...they thought it was MS at first but now just  because of the B12 problem...but I don't really understand..." Premorbid pt/family roles/activities: As noted, pt has suffered with major depression and basically made herself home bound for approx 4 months PTA.  She is the mother of two sons.  The eldest son, (8) lives primarily with pt's mother next door. Anticipated changes in roles/activities/participation: Hopefully, pt's overall functioning will be significantly improved.  Per her report, she feels very postive about her emotional status currently and glad to see "the old Jill Summers back." Pt/family expectations/goals: Pt hopes to be able to return to her own home and resume care of her sons.  Community Resources [employer] Stra[employer]Community Agencies: None Premorbid Home Care/DME Agencies: None Transportation available at discharge: yes Resource referrals recommended: Neuropsychology, Support group (specify)  Discharge Planning Living Arrangements: Children Support Systems: Children, Counselling psychologistarent, Other relatives, Friends/neighbors Type of Residence: Private residence Insurance Resources: Medicaid (specify county) Surveyor, quantityinancial Screen Referred: No Living Expenses: Psychologist, sport and exerciseent Money Management: Patient Does the patient have any problems obtaining your medications?: No Home Management: pt Patient/Family Preliminary Plans: Pt plans to return to her own apt withher children.  Assist prn from mother and friends Social Work Anticipated Follow Up Needs: HH/OP Expected length of stay: 7-10  Clinical Impression Very pleasant young woman here following a 40mo depressive episode and newly found spinal lesions and B12 def.  Making good gains so far and team anticipates mod i goals.  Good support from mother, sister and friend.  Pt completely confident that she will [facility] her counseling and [facility] after d/c.  Will follow for support and d/c planning.  Jill Summers 12/22/2013, 4:15 PM

## 2013-12-26 NOTE — Care Management Note (Signed)
Inpatient Rehabilitation Center Individual Statement of Services  Patient Name:  Jill Summers  Date:  12/22/2013  Welcome to the Inpatient Rehabilitation Center.  Our goal is to provide you with an individualized program based on your diagnosis and situation, designed to meet your specific needs.  With this comprehensive rehabilitation program, you will be expected to participate in at least 3 hours of rehabilitation therapies Monday-Friday, with modified therapy programming on the weekends.  Your rehabilitation program will include the following services:  Physical Therapy (PT), Occupational Therapy (OT), Speech Therapy (ST), 24 hour per day rehabilitation nursing, Therapeutic Recreaction (TR), Neuropsychology, Case Management (Social Worker), Rehabilitation Medicine, Nutrition Services and Pharmacy Services  Weekly team conferences will be held on Tuesdays to discuss your progress.  Your Social Worker will talk with you frequently to get your input and to update you on team discussions.  Team conferences with you and your family in attendance may also be held.  Expected length of stay: 7 - 10 days  Overall anticipated outcome: supervision/ mod i  Depending on your progress and recovery, your program may change. Your Social Worker will coordinate services and will keep you informed of any changes. Your Social Worker's name and contact numbers are listed  below.  The following services may also be recommended but are not provided by the Inpatient Rehabilitation Center:   Driving Evaluations  Home Health Rehabiltiation Services  Outpatient Rehabilitation Services  Vocational Rehabilitation   Arrangements will be made to provide these services after discharge if needed.  Arrangements include referral to agencies that provide these services.  Your insurance has been verified to be:  Medicaid Your [facility]  Pertinent information will be shared with your  doctor and your insurance company.  Social Worker:  Dennis AcresLucy Batul Diego, [place] [phone] or [phone]) [phone]   Information discussed with and copy given to patient by: Amada JupiterHOYLE, Avah Bashor, 12/22/2013, 4:17 PM

## 2013-12-26 NOTE — Progress Notes (Signed)
Physical Therapy Session Note  Patient Details  Name: Jill Summers MRN: [id number] Date of Birth: September 25, 1985  Today's Date: 12/26/2013 PT Individual Time: 0800-0900 PT Individual Time Calculation (min): 60 min   Short Term Goals: Week 1:  PT Short Term Goal 1 (Week 1): Pt to maintain dynamic standing balance without B UE support and min A PT Short Term Goal 2 (Week 1): Pt to perform SPT bed<>w/c with RW and supervison, 50% of time PT Short Term Goal 3 (Week 1): Pt to propel w/c 200' with B UE and supervision demonstrating fluid and consistent pace PT Short Term Goal 4 (Week 1): Pt to ambulate 100' with RW and superivsion, 50% of time PT Short Term Goal 5 (Week 1): Pt to negotiate up/down 2 steps with RW and min A  Skilled Therapeutic Interventions/Progress Updates:   Session focused on functional gait training with RW in pt room to use bathroom and brush teeth at sink with overall close S; steady A to don jacked without UE support in standing; gait down and back from therapy gym with steady A (cues for staying inside RW, slowing down a little (walker gets ahead of pt and tends to take very large steps), performed [place] HEP for LE strengthening and balance x 2 sets of 10 reps each (modified heel raises to perform seated due to decreased ability to perform ankle df in standing) with 2 # ankle weights, neuro re-ed for balance reaction training on compliant and non compliant surfaces with and without UE support and progressing to eyes closed (unable to maintain balance with eyes closed without UE support) less than 1 min each trial and balance and motor control training with alternating toe taps to target on step; stair negotiation with bilateral rails for functional strengthening and community mobility with close S; used 2# ankle weights during gait to increase proprioceptive input (pt reports feeling more stable). Discussed any concerns with d/c on Tuesday but pt denies any at this time. Educated  on importance of use of RW at all times for mobility and recommending HHPT at this time for follow up.   Therapy Documentation Precautions:  Precautions Precautions: Fall Restrictions Weight Bearing Restrictions: No  Pain:  Denies pain.   See FIM for current functional status  Therapy/Group: Individual Therapy  Jill Summers, Jill Summers [facility] 12/26/2013, 9:59 AM

## 2013-12-26 NOTE — Plan of Care (Signed)
Problem: RH BOWEL ELIMINATION Goal: RH STG MANAGE BOWEL WITH ASSISTANCE STG Manage Bowel with min Assistance.  Outcome: Progressing  Problem: RH BLADDER ELIMINATION Goal: RH STG MANAGE BLADDER WITH ASSISTANCE STG Manage Bladder With min Assistance  Outcome: Progressing Goal: RH STG MANAGE BLADDER WITH EQUIPMENT WITH ASSISTANCE STG Manage Bladder With Equipment With min Assistance  Outcome: Completed/Met Date Met:  12/26/13  Problem: RH SKIN INTEGRITY Goal: RH STG SKIN FREE OF INFECTION/BREAKDOWN Remain free from infection and breakdown while on rehab with min assist  Outcome: Progressing Goal: RH STG MAINTAIN SKIN INTEGRITY WITH ASSISTANCE STG Maintain Skin Integrity With min Assistance.  Outcome: Progressing  Problem: RH SAFETY Goal: RH STG ADHERE TO SAFETY PRECAUTIONS W/ASSISTANCE/DEVICE STG Adhere to Safety Precautions With min Assistance/Device.  Outcome: Progressing Goal: RH STG DECREASED RISK OF FALL WITH ASSISTANCE STG Decreased Risk of Fall With min Assistance.  Outcome: Progressing  Problem: RH PAIN MANAGEMENT Goal: RH STG PAIN MANAGED AT OR BELOW PT'S PAIN GOAL <2 on a 0-10 scale  Outcome: Progressing

## 2013-12-26 NOTE — Progress Notes (Signed)
Occupational Therapy Session Notes  Patient Details  Name: Jill Summers MRN: [id number] Date of Birth: December 07, 1985  Today's Date: 12/26/2013   Short Term Goals: Week 1:  OT Short Term Goal 1 (Week 1): Pt will perform LB dressing with Min A in order to increase I in self care.  OT Short Term Goal 2 (Week 1): Pt will perform shower transfer with Min A in order to increase I in functional transfers. OT Short Term Goal 3 (Week 1): Pt will perform 15 minutes of functional activities without rest break in order to increase endurance for ADL and IADL tasks.  Skilled Therapeutic Interventions/Progress Updates:   Session #1 1100-1200 - 60 Minutes Individual Therapy No complaints of pain Patient received seated in w/c with NT present. Patient with no complaints of pain. Patient gathered necessary items and propelled self in w/c from room > tub room for ADL retraining at shower level using tub transfer bench in tub/shower unit. Patient required min verbal cues for safe transfer (patient performed stand pivot transfer > bench without RW, this therapist recommends patient use RW for all stand pivot transfers and all sit<>stands). Patient transferred out of tub>w/c with supervision and propelled self > room for UB/LB dressing in sit<>stand position. Again, patient required min verbal cues for safety during sit<>stand. Patient then ambulated from room > therapy gym for therapeutic exercise focusing on sit<>stands, quad strengthening, dynamic standing balance/tolerance/endurance, overall safety, and overall activity tolerance/endurance. Patient educated on her poor proprioception and compensatory strategies to emphasize safety. Patient ambulated back to room and left seated EOB with all needs within reach. Patient met shower transfer short term goal and LB dressing short term goal .   Session #2 1300-1400 - 60 Minutes Individual Therapy No complaints of pain Patient received seated EOB. Patient stood  with RW and ambulated > ADL apartment. In ADL apartment, focused skilled intervention on functional mobility, safety with RW, simple meal prep, energy conservation, and overall activity tolerance/endurance. Patient performed meal prep at an overall supervision level ambulating with RW and sitting prn for energy conservation. Patient required min verbal cues for safety with RW. Therapist educated patient on safety in kitchen to ensure safety during meal prep and kitchen tasks. Patient receptive to information and education. Patient ambulated back to room and left seated EOB with all needs within reach. Patient met STG of engaging in activity for at least 15 minutes without a rest break.   Precautions:  Precautions Precautions: Fall Restrictions Weight Bearing Restrictions: No  See FIM for current functional status  Granvil Djordjevic 12/26/2013, 7:29 AM

## 2013-12-26 NOTE — Progress Notes (Signed)
[place] PHYSICAL MEDICINE & REHABILITATION     PROGRESS NOTE    Subjective/Complaints: Good night. Denies pain. Getting stronger  ROS: leg numbness still present.  Objective: Vital Signs: Blood pressure 114/60, pulse 70, temperature 98.3 F (36.8 C), temperature source Oral, resp. rate 18, height 5\' 2"  (1.575 m), weight 95.21 kg (209 lb 14.4 oz), last menstrual period 12/13/2013, SpO2 100 %. No results found.  Recent Labs  12/24/13 0718 12/26/13 0430  WBC 3.2* 4.0  HGB 8.8* 9.1*  HCT 26.9* 28.1*  PLT 189 341    Recent Labs  12/24/13 0718  NA 143  K 3.7  CL 106  GLUCOSE 101*  BUN 4*  CREATININE 0.66  CALCIUM 8.9   CBG (last 3)  No results for input(s): GLUCAP in the last 72 hours.  Wt Readings from Last 3 Encounters:  12/24/13 95.21 kg (209 lb 14.4 oz)  12/16/13 97.2 kg (214 lb 4.6 oz)  12/17/13 99.791 kg (220 lb)    Physical Exam:  Constitutional: She is oriented to person, place, and time. She appears well-developed.  HENT: oral mucosa pink and moist, dentition fair Head: Normocephalic.  Eyes: EOM are normal.  Neck: Normal range of motion. Neck supple. No thyromegaly present.  Cardiovascular: Normal rate and regular rhythm. no murmurs Respiratory: Effort normal and breath sounds normal. No respiratory distress.  GI: Bowel sounds are normal. She exhibits no distension. Non tender   Skin: Skin is warm and dry.  Neuro: Alert and oriented. Good insight and awareness, CN exam intact Motor strength is 5/5 bilateral deltoids, biceps, triceps, grip 3+ to 4-/5 in the hip flexors and, 4/5 knee extensors 4/5 ankle dorsiflexor and plantarflexors.  Sensation reduced to light touch in both legs below the mid thighs---improving. Senses pain Reduced proprioception in both legs as well in same distribution---improved. Can sense gross pain. No sensory findings in upper extremities and no sensory level in abdomen. Deep tendon reflexes 1+ at patella and absent  at ankles bilateral lower extremities Psych: normal affect, behavior. Musc: no edema, no gross pain with ROM/movement  Assessment/Plan: 1. Functional deficits secondary to severe B12 deficiency polyneuropathy  which require 3+ hours per day of interdisciplinary therapy in a comprehensive inpatient rehab setting. Physiatrist is providing close team supervision and 24 hour management of active medical problems listed below. Physiatrist and rehab team continue to assess barriers to discharge/monitor patient progress toward functional and medical goals.      FIM: FIM - Bathing Bathing Steps Patient Completed: Chest, Right Arm, Left Arm, Abdomen, Front perineal area, Buttocks, Right upper leg, Left upper leg, Right lower leg (including foot), Left lower leg (including foot) Bathing: 4: Steadying assist  FIM - Upper Body Dressing/Undressing Upper body dressing/undressing steps patient completed: Thread/unthread right sleeve of pullover shirt/dresss, Thread/unthread left sleeve of pullover shirt/dress, Put head through opening of pull over shirt/dress, Pull shirt over trunk Upper body dressing/undressing: 5: Supervision: Safety issues/verbal cues FIM - Lower Body Dressing/Undressing Lower body dressing/undressing steps patient completed: Thread/unthread right pants leg, Thread/unthread left pants leg, Pull pants up/down, Don/Doff right sock, Don/Doff left sock Lower body dressing/undressing: 4: Steadying Assist  FIM - Toileting Toileting steps completed by patient: Adjust clothing prior to toileting, Performs perineal hygiene, Adjust clothing after toileting Toileting Assistive Devices: Grab bar or rail for support Toileting: 5: Supervision: Safety issues/verbal cues  FIM - Diplomatic Services operational officerToilet Transfers Toilet Transfers Assistive Devices: Art gallery managerWalker Toilet Transfers: 4-To toilet/BSC: Min A (steadying Pt. > 75%), 4-From toilet/BSC: Min A (steadying Pt. >  75%)  FIM - Bed/Chair Transfer Bed/Chair Transfer  Assistive Devices: Therapist, occupationalWalker Bed/Chair Transfer: 5: Bed > Chair or W/C: Supervision (verbal cues/safety issues), 5: Chair or W/C > Bed: Supervision (verbal cues/safety issues)  FIM - Locomotion: Wheelchair Distance: 50 Locomotion: Wheelchair: 6: Travels 150 ft or more, turns around, maneuvers to table, bed or toilet, negotiates 3% grade: maneuvers on rugs and over door sills independently FIM - Locomotion: Ambulation Locomotion: Ambulation Assistive Devices: Designer, industrial/productWalker - Rolling Ambulation/Gait Assistance: 5: Supervision Locomotion: Ambulation: 5: Travels 150 ft or more with supervision/safety issues  Comprehension Comprehension Mode: Auditory Comprehension: 7-Follows complex conversation/direction: With no assist  Expression Expression Mode: Verbal Expression: 7-Expresses complex ideas: With no assist  Social Interaction Social Interaction: 7-Interacts appropriately with others - No medications needed.  Problem Solving Problem Solving: 7-Solves complex problems: Recognizes & self-corrects  Memory Memory: 7-Complete Independence: No helper  Medical Problem List and Plan: 1. Functional deficits secondary to severe B12 deficiency with peripheral polyneuropathy.   -pt continues to demonstrate neurological recovery 2. DVT Prophylaxis/Anticoagulation: SCDs.   3. Pain Management: Tylenol as needed 4. Mood/depression: Wellbutrin 150 mg daily, Xanax as needed. Provide emotional support\  -appears to be in good spirits at present  -neuropsych eval pending 5. Neuropsych: This patient is capable of making decisions on her own behalf. 6. Skin/Wound Care: Routine skin checks 7. Fluids/Electrolytes/Nutrition: Strict I and os.    -k+ supplement 8. Probable UTI. Urine cultures negative. 9. History of bronchial asthma. Albuterol nebulizer as needed. 10. Pancytopenia. Labs appear to be trending up.   -counts continue to recover LOS (Days) 7 A FACE TO FACE EVALUATION WAS  PERFORMED  SWARTZ,ZACHARY T 12/26/2013 8:04 AM

## 2013-12-26 NOTE — Progress Notes (Signed)
Physical Therapy Session Note  Patient Details  Name: Jill Summers MRN: [id number] Date of Birth: 12-May-1985  Today's Date: 12/26/2013 PT Individual Time: 1425-1525 PT Individual Time Calculation (min): 60 min   Short Term Goals: Week 1:  PT Short Term Goal 1 (Week 1): Pt to maintain dynamic standing balance without B UE support and min A PT Short Term Goal 2 (Week 1): Pt to perform SPT bed<>w/c with RW and supervison, 50% of time PT Short Term Goal 3 (Week 1): Pt to propel w/c 200' with B UE and supervision demonstrating fluid and consistent pace PT Short Term Goal 4 (Week 1): Pt to ambulate 100' with RW and superivsion, 50% of time PT Short Term Goal 5 (Week 1): Pt to negotiate up/down 2 steps with RW and min A  Skilled Therapeutic Interventions/Progress Updates:   Community mobility and gait training outdoors on uneven surface >200' with overall close S (few episodes of steady A needed when RW wheels hit uneven pavement but pt able to correct for after several reptitions) and education on community mobility and energy conservation. Curb step negotiation with RW x 2 reps with steady A and cues for safety and foot placement to simulate home entry. LE strengthening seated and in standing on Kinetron (2 reps of 1 min intervals seated and 10 reps BLE in standing x 2 sets) with rest breaks between. Dynamic standing balance and standing tolerance activity to play Wii Bowling wit 1 and no UE support with close S - pt able to correct for any LOB. Gait with RW back to pt room with close S; intermittent cues for safe positioning of RW during turns. Returned to bed end of session to rest.   Therapy Documentation Precautions:  Precautions Precautions: Fall Restrictions Weight Bearing Restrictions: No   Pain:  Denies pain.  See FIM for current functional status  Therapy/Group: Individual Therapy  [name], Randie [place] [facility] 12/26/2013, 3:32 PM

## 2013-12-26 NOTE — Plan of Care (Signed)
Problem: RH BOWEL ELIMINATION Goal: RH STG MANAGE BOWEL WITH ASSISTANCE STG Manage Bowel with min Assistance.  Outcome: Not Progressing Goal: RH STG MANAGE BOWEL W/MEDICATION W/ASSISTANCE STG Manage Bowel with Medication with min Assistance.  Outcome: Not Progressing  Problem: RH BLADDER ELIMINATION Goal: RH STG MANAGE BLADDER WITH ASSISTANCE STG Manage Bladder With min Assistance  Outcome: Progressing  Problem: RH SKIN INTEGRITY Goal: RH STG SKIN FREE OF INFECTION/BREAKDOWN Remain free from infection and breakdown while on rehab with min assist  Outcome: Progressing Goal: RH STG MAINTAIN SKIN INTEGRITY WITH ASSISTANCE STG Maintain Skin Integrity With min Assistance.  Outcome: Progressing  Problem: RH SAFETY Goal: RH STG ADHERE TO SAFETY PRECAUTIONS W/ASSISTANCE/DEVICE STG Adhere to Safety Precautions With min Assistance/Device.  Outcome: Progressing Goal: RH STG DECREASED RISK OF FALL WITH ASSISTANCE STG Decreased Risk of Fall With min Assistance.  Outcome: Progressing  Problem: RH PAIN MANAGEMENT Goal: RH STG PAIN MANAGED AT OR BELOW PT'S PAIN GOAL <2 on a 0-10 scale  Outcome: Progressing     

## 2013-12-27 ENCOUNTER — Inpatient Hospital Stay (HOSPITAL_COMMUNITY): Payer: Medicaid Other | Admitting: Occupational Therapy

## 2013-12-27 ENCOUNTER — Encounter (HOSPITAL_COMMUNITY): Payer: Medicaid Other | Admitting: Occupational Therapy

## 2013-12-27 ENCOUNTER — Inpatient Hospital Stay (HOSPITAL_COMMUNITY): Payer: Medicaid Other | Admitting: *Deleted

## 2013-12-27 MED ORDER — B COMPLEX-C PO TABS
1.0000 | ORAL_TABLET | Freq: Every day | ORAL | Status: DC
  Administered 2013-12-27 – 2013-12-30 (×4): 1 via ORAL
  Filled 2013-12-27 (×5): qty 1

## 2013-12-27 NOTE — Plan of Care (Signed)
Problem: RH BOWEL ELIMINATION Goal: RH STG MANAGE BOWEL WITH ASSISTANCE STG Manage Bowel with min Assistance.  Outcome: Progressing Goal: RH STG MANAGE BOWEL W/MEDICATION W/ASSISTANCE STG Manage Bowel with Medication with min Assistance.  Outcome: Progressing  Problem: RH BLADDER ELIMINATION Goal: RH STG MANAGE BLADDER WITH ASSISTANCE STG Manage Bladder With min Assistance  Outcome: Progressing  Problem: RH SKIN INTEGRITY Goal: RH STG SKIN FREE OF INFECTION/BREAKDOWN Remain free from infection and breakdown while on rehab with min assist  Outcome: Progressing Goal: RH STG MAINTAIN SKIN INTEGRITY WITH ASSISTANCE STG Maintain Skin Integrity With min Assistance.  Outcome: Progressing  Problem: RH SAFETY Goal: RH STG ADHERE TO SAFETY PRECAUTIONS W/ASSISTANCE/DEVICE STG Adhere to Safety Precautions With min Assistance/Device.  Outcome: Progressing Goal: RH STG DECREASED RISK OF FALL WITH ASSISTANCE STG Decreased Risk of Fall With min Assistance.  Outcome: Progressing  Problem: RH PAIN MANAGEMENT Goal: RH STG PAIN MANAGED AT OR BELOW PT'S PAIN GOAL <2 on a 0-10 scale  Outcome: Progressing     

## 2013-12-27 NOTE — Plan of Care (Signed)
Problem: RH BOWEL ELIMINATION Goal: RH STG MANAGE BOWEL WITH ASSISTANCE STG Manage Bowel with min Assistance.  Outcome: Not Progressing Goal: RH STG MANAGE BOWEL W/MEDICATION W/ASSISTANCE STG Manage Bowel with Medication with min Assistance.  Outcome: Not Progressing  Problem: RH BLADDER ELIMINATION Goal: RH STG MANAGE BLADDER WITH ASSISTANCE STG Manage Bladder With min Assistance  Outcome: Progressing  Problem: RH SKIN INTEGRITY Goal: RH STG SKIN FREE OF INFECTION/BREAKDOWN Remain free from infection and breakdown while on rehab with min assist  Outcome: Progressing Goal: RH STG MAINTAIN SKIN INTEGRITY WITH ASSISTANCE STG Maintain Skin Integrity With min Assistance.  Outcome: Progressing  Problem: RH SAFETY Goal: RH STG ADHERE TO SAFETY PRECAUTIONS W/ASSISTANCE/DEVICE STG Adhere to Safety Precautions With min Assistance/Device.  Outcome: Progressing Goal: RH STG DECREASED RISK OF FALL WITH ASSISTANCE STG Decreased Risk of Fall With min Assistance.  Outcome: Progressing  Problem: RH PAIN MANAGEMENT Goal: RH STG PAIN MANAGED AT OR BELOW PT'S PAIN GOAL <2 on a 0-10 scale  Outcome: Progressing

## 2013-12-27 NOTE — Progress Notes (Signed)
Occupational Therapy Session Notes  Patient Details  Name: Jill Summers MRN: [id number] Date of Birth: 1985/07/10  Today's Date: 12/27/2013  Short Term Goals: Week 1:  OT Short Term Goal 1 (Week 1): Pt will perform LB dressing with Min A in order to increase I in self care.  OT Short Term Goal 2 (Week 1): Pt will perform shower transfer with Min A in order to increase I in functional transfers. OT Short Term Goal 3 (Week 1): Pt will perform 15 minutes of functional activities without rest break in order to increase endurance for ADL and IADL tasks.  Skilled Therapeutic Interventions/Progress Updates:   Session #1 0930-1030 - 60 Minutes Individual Therapy No complaints of pain Patient received seated EOB. Patient stood with RW and ambulated throughout room to gather necessary items for ADL retraining at shower level. Patient ambulated from room > tub room for tub/shower transfer onto tub transfer bench. Patient completed transfer with supervision. Patient completed UB/LB bathing in seated position with distant supervision. Patient dried, donned hospital gown, then ambulated back to room for UB/LB dressing and grooming tasks while standing at sink. Patient then ambulated > therapy gym and transferred onto NuStep machine for 10 minutes of BLE exercise on level 4. Patient then engaged in obstacle course exercise (simulated like at home - patient has two boys and house will have many obstacles throughout her house). Patient with increased RW safety this session and didn't require as many verbal cues for safety. Patient ambulated back to room and left seated EOB with all needs within reach.   Session #2 1230-1300 - 30 Minutes Individual Therapy No complaints of pain Patient received supine in bed with NT present. Patient engaged in bed mobility, sat EOB, and then ambulated > ADL apartment. Patient worked on Surveyor, miningkitchen activity of heating up left overs from meal prep yesterday. Patient then  ambulated back to room to consume meal. During meal consumption, therapist talked with patient about community re-entry. Patient expressed anxiety and concern regarding going out in public using a RW, this therapist provided psychosocial support. Therapist then engaged patient in BUE strengthening exercises, writing exercises down for her to complete later. At end of session, left patient seated EOB with all needs within reach.   Session #3 1400-1430 - 30 Minutes Individual Therapy No complaints of pain Patient received supine in bed asleep. Patient engaged in bed mobility at mod I level, stood with RW, then ambulated > therapy gym. Patient transferred onto NuStep machine and completed BLE exercise for 10 minutes on level 5 for focus on overall BLE strength and whole body endurance. Patient then ambulated to blue therapy mat and completed 3 sets of BUE strengthening exercises discussed earlier using weighted dumbbells (patient completed these at mod I level). Patient ambulated back to room, completed toilet transfer and toileting at mod I level, then transferred back to bed. Patient left supine in bed with all needs within reach.   Precautions:  Precautions Precautions: Fall Restrictions Weight Bearing Restrictions: No  See FIM for current functional status  Jill Summers 12/27/2013, 10:36 AM

## 2013-12-27 NOTE — Progress Notes (Signed)
Physical Therapy Session Note  Patient Details  Name: Jill Summers MRN: [id number] Date of Birth: Jun 06, 1985  Today's Date: 12/27/2013 PT Individual Time: 1104-1200 PT Individual Time Calculation (min): 56 min    Skilled Therapeutic Interventions/Progress Updates:  TE: Nustep x 5 min in order to warm -up and increase strength and facilitate reciprocal movements. Exercises in sitting with thera-band resistance(blue) 2 x15 LAQ 2 x10- hamstring curls, 1x15 hip abd.  Gait training with RW on a distance of 200 feet, patient requires increased cues for step length and to reduce weight bearing through UE. Balance training in II bars (for safety) with use of BOSU ball: lunges 2 x10 , squats on BOSU 2 x 5 step up and downs. Standing unsupported 2 x 30 seconds with min guarding from therapist, toe touch the cone with UE supported 2 x 15,alternating sides  On mat patient performed transfer to quadruped position and was able to perform alternating UE and LE exercises to increase balance, min A and VC needed to achieve position and fully participate. Increased rest time required in order to perform all activities during today's session. Patient returned to room all needs within reach, no c/o pain during this session.  Therapy Documentation Precautions:  Precautions Precautions: Fall Restrictions Weight Bearing Restrictions: No Pain: Pain Assessment Pain Assessment: No/denies pain  See FIM for current functional status  Therapy/Group: Individual Therapy  Dorna MaiCzajkowska, Dhanvin Szeto W 12/27/2013, 12:28 PM

## 2013-12-27 NOTE — Progress Notes (Signed)
[place] PHYSICAL MEDICINE & REHABILITATION     PROGRESS NOTE    Subjective/Complaints: Has questions about b12 supp. Feeling well. Appetite better  ROS: leg numbness still present but better  Objective: Vital Signs: Blood pressure 106/55, pulse 62, temperature 97.7 F (36.5 C), temperature source Oral, resp. rate 17, height 5\' 2"  (1.575 m), weight 95.21 kg (209 lb 14.4 oz), last menstrual period 12/13/2013, SpO2 100 %. No results found.  Recent Labs  12/26/13 0430  WBC 4.0  HGB 9.1*  HCT 28.1*  PLT 341   No results for input(s): NA, K, CL, GLUCOSE, BUN, CREATININE, CALCIUM in the last 72 hours.  Invalid input(s): CO CBG (last 3)  No results for input(s): GLUCAP in the last 72 hours.  Wt Readings from Last 3 Encounters:  12/24/13 95.21 kg (209 lb 14.4 oz)  12/16/13 97.2 kg (214 lb 4.6 oz)  12/17/13 99.791 kg (220 lb)    Physical Exam:  Constitutional: She is oriented to person, place, and time. She appears well-developed.  HENT: oral mucosa pink and moist, dentition fair Head: Normocephalic.  Eyes: EOM are normal.  Neck: Normal range of motion. Neck supple. No thyromegaly present.  Cardiovascular: Normal rate and regular rhythm. no murmurs Respiratory: Effort normal and breath sounds normal. No respiratory distress.  GI: Bowel sounds are normal. She exhibits no distension. Non tender   Skin: Skin is warm and dry.  Neuro: Alert and oriented. Good insight and awareness, CN exam intact Motor strength is 5/5 bilateral deltoids, biceps, triceps, grip 3+ to 4-/5 in the hip flexors and, 4/5 knee extensors 4/5 ankle dorsiflexor and plantarflexors.  Sensation reduced to light touch in both legs below the mid thighs---improving. Senses pain Reduced proprioception in both legs as well in same distribution---improved. Can sense gross pain. No sensory findings in upper extremities and no sensory level in abdomen. Deep tendon reflexes 1+ at patella and absent at  ankles bilateral lower extremities Psych: normal affect, behavior. Musc: no edema, no gross pain with ROM/movement  Assessment/Plan: 1. Functional deficits secondary to severe B12 deficiency polyneuropathy  which require 3+ hours per day of interdisciplinary therapy in a comprehensive inpatient rehab setting. Physiatrist is providing close team supervision and 24 hour management of active medical problems listed below. Physiatrist and rehab team continue to assess barriers to discharge/monitor patient progress toward functional and medical goals.      FIM: FIM - Bathing Bathing Steps Patient Completed: Chest, Right Arm, Left Arm, Abdomen, Front perineal area, Buttocks, Right upper leg, Left upper leg, Right lower leg (including foot), Left lower leg (including foot) Bathing: 5: Supervision: Safety issues/verbal cues (seated position)  FIM - Upper Body Dressing/Undressing Upper body dressing/undressing steps patient completed: Thread/unthread right sleeve of pullover shirt/dresss, Thread/unthread left sleeve of pullover shirt/dress, Put head through opening of pull over shirt/dress, Pull shirt over trunk Upper body dressing/undressing: 6: Assistive device (Comment) FIM - Lower Body Dressing/Undressing Lower body dressing/undressing steps patient completed: Thread/unthread right pants leg, Thread/unthread left pants leg, Pull pants up/down, Don/Doff right sock, Don/Doff left sock, Don/Doff right shoe, Don/Doff left shoe Lower body dressing/undressing: 5: Supervision: Safety issues/verbal cues  FIM - Toileting Toileting steps completed by patient: Adjust clothing prior to toileting, Performs perineal hygiene, Adjust clothing after toileting Toileting Assistive Devices: Grab bar or rail for support Toileting: 5: Supervision: Safety issues/verbal cues  FIM - Diplomatic Services operational officerToilet Transfers Toilet Transfers Assistive Devices: Art gallery managerWalker Toilet Transfers: 4-To toilet/BSC: Min A (steadying Pt. > 75%), 4-From  toilet/BSC: Min A (steadying  Pt. > 75%)  FIM - Bed/Chair Transfer Bed/Chair Transfer Assistive Devices: Therapist, occupationalWalker Bed/Chair Transfer: 6: Supine > Sit: No assist, 6: Sit > Supine: No assist, 5: Bed > Chair or W/C: Supervision (verbal cues/safety issues), 5: Chair or W/C > Bed: Supervision (verbal cues/safety issues)  FIM - Locomotion: Wheelchair Distance: 50 Locomotion: Wheelchair: 0: Activity did not occur FIM - Locomotion: Ambulation Locomotion: Ambulation Assistive Devices: Designer, industrial/productWalker - Rolling Ambulation/Gait Assistance: 5: Supervision Locomotion: Ambulation: 5: Travels 150 ft or more with supervision/safety issues  Comprehension Comprehension Mode: Auditory Comprehension: 5-Understands complex 90% of the time/Cues < 10% of the time  Expression Expression Mode: Verbal Expression: 7-Expresses complex ideas: With no assist  Social Interaction Social Interaction: 7-Interacts appropriately with others - No medications needed.  Problem Solving Problem Solving: 6-Solves complex problems: With extra time  Memory Memory: 7-Complete Independence: No helper  Medical Problem List and Plan: 1. Functional deficits secondary to severe B12 deficiency with peripheral polyneuropathy.   -pt continues to demonstrate neurological recovery 2. DVT Prophylaxis/Anticoagulation: SCDs.   3. Pain Management: Tylenol as needed 4. Mood/depression: Wellbutrin 150 mg daily, Xanax as needed. Provide emotional support\  -appears to be in good spirits at present  -neuropsych eval pending 5. Neuropsych: This patient is capable of making decisions on her own behalf. 6. Skin/Wound Care: Routine skin checks 7. Fluids/Electrolytes/Nutrition: Strict I and os.    -k+ supplement 8. Probable UTI. Urine cultures negative. 9. History of bronchial asthma. Albuterol nebulizer as needed. 10. Pancytopenia. Labs appear to be trending up.   -counts continue to recover  -will initiate b12 supplement  -to receive monthly  injections also (Intrinsic factor ab +) LOS (Days) 8 A FACE TO FACE EVALUATION WAS PERFORMED  Teresa Lemmerman T 12/27/2013 8:04 AM

## 2013-12-28 ENCOUNTER — Inpatient Hospital Stay (HOSPITAL_COMMUNITY): Payer: Medicaid Other | Admitting: Occupational Therapy

## 2013-12-28 ENCOUNTER — Inpatient Hospital Stay (HOSPITAL_COMMUNITY): Payer: Medicaid Other | Admitting: Physical Therapy

## 2013-12-28 ENCOUNTER — Inpatient Hospital Stay (HOSPITAL_COMMUNITY): Payer: Medicaid Other

## 2013-12-28 LAB — VITAMIN B12: Vitamin B-12: 665 pg/mL (ref 211–911)

## 2013-12-28 MED ORDER — BISACODYL 10 MG RE SUPP
10.0000 mg | Freq: Every day | RECTAL | Status: DC | PRN
  Administered 2013-12-28: 10 mg via RECTAL
  Filled 2013-12-28: qty 1

## 2013-12-28 MED ORDER — SENNOSIDES-DOCUSATE SODIUM 8.6-50 MG PO TABS
2.0000 | ORAL_TABLET | Freq: Every day | ORAL | Status: DC
  Administered 2013-12-29: 2 via ORAL
  Filled 2013-12-28 (×2): qty 2

## 2013-12-28 NOTE — Progress Notes (Signed)
[place] PHYSICAL MEDICINE & REHABILITATION     PROGRESS NOTE    Subjective/Complaints: No questions today. Slept well. Good appetite. Pleased to be going home soon  ROS: leg numbness still present but better  Objective: Vital Signs: Blood pressure 97/50, pulse 55, temperature 97.5 F (36.4 C), temperature source Oral, resp. rate 20, height 5\' 2"  (1.575 m), weight 95.21 kg (209 lb 14.4 oz), last menstrual period 12/13/2013, SpO2 100 %. No results found.  Recent Labs  12/26/13 0430  WBC 4.0  HGB 9.1*  HCT 28.1*  PLT 341   No results for input(s): NA, K, CL, GLUCOSE, BUN, CREATININE, CALCIUM in the last 72 hours.  Invalid input(s): CO CBG (last 3)  No results for input(s): GLUCAP in the last 72 hours.  Wt Readings from Last 3 Encounters:  12/24/13 95.21 kg (209 lb 14.4 oz)  12/16/13 97.2 kg (214 lb 4.6 oz)  12/17/13 99.791 kg (220 lb)    Physical Exam:  Constitutional: She is oriented to person, place, and time. She appears well-developed.  HENT: oral mucosa pink and moist, dentition fair Head: Normocephalic.  Eyes: EOM are normal.  Neck: Normal range of motion. Neck supple. No thyromegaly present.  Cardiovascular: Normal rate and regular rhythm. no murmurs Respiratory: Effort normal and breath sounds normal. No respiratory distress.  GI: Bowel sounds are normal. She exhibits no distension. Non tender   Skin: Skin is warm and dry.  Neuro: Alert and oriented. Good insight and awareness, CN exam intact Motor strength is 5/5 bilateral deltoids, biceps, triceps, grip 3+ to 4-/5 in the hip flexors and, 4/5 knee extensors 4/5 ankle dorsiflexor and plantarflexors.  Sensation reduced to light touch in both legs below the mid thighs---improving. Senses pain Reduced proprioception in both legs as well in same distribution---improved. Can sense gross pain. No sensory findings in upper extremities and no sensory level in abdomen. Deep tendon reflexes 1+ at patella  and absent at ankles bilateral lower extremities Psych: normal affect, behavior. Musc: no edema, no gross pain with ROM/movement  Assessment/Plan: 1. Functional deficits secondary to severe B12 deficiency polyneuropathy  which require 3+ hours per day of interdisciplinary therapy in a comprehensive inpatient rehab setting. Physiatrist is providing close team supervision and 24 hour management of active medical problems listed below. Physiatrist and rehab team continue to assess barriers to discharge/monitor patient progress toward functional and medical goals.      FIM: FIM - Bathing Bathing Steps Patient Completed: Chest, Right Arm, Left Arm, Abdomen, Front perineal area, Buttocks, Right upper leg, Left upper leg, Right lower leg (including foot), Left lower leg (including foot) Bathing: 5: Supervision: Safety issues/verbal cues (seated on tub transfer bench)  FIM - Upper Body Dressing/Undressing Upper body dressing/undressing steps patient completed: Thread/unthread right sleeve of pullover shirt/dresss, Thread/unthread left sleeve of pullover shirt/dress, Put head through opening of pull over shirt/dress, Pull shirt over trunk Upper body dressing/undressing: 6: Assistive device (Comment) FIM - Lower Body Dressing/Undressing Lower body dressing/undressing steps patient completed: Thread/unthread right pants leg, Thread/unthread left pants leg, Pull pants up/down, Don/Doff right sock, Don/Doff left sock, Don/Doff right shoe, Don/Doff left shoe, Fasten/unfasten right shoe, Fasten/unfasten left shoe Lower body dressing/undressing: 5: Supervision: Safety issues/verbal cues  FIM - Toileting Toileting steps completed by patient: Adjust clothing prior to toileting, Performs perineal hygiene, Adjust clothing after toileting Toileting Assistive Devices: Grab bar or rail for support Toileting: 5: Supervision: Safety issues/verbal cues  FIM - Diplomatic Services operational officerToilet Transfers Toilet Transfers Assistive Devices:  Art gallery managerWalker Toilet Transfers:  4-To toilet/BSC: Min A (steadying Pt. > 75%), 4-From toilet/BSC: Min A (steadying Pt. > 75%)  FIM - Bed/Chair Transfer Bed/Chair Transfer Assistive Devices: Therapist, occupationalWalker Bed/Chair Transfer: 6: Supine > Sit: No assist, 6: Sit > Supine: No assist, 5: Bed > Chair or W/C: Supervision (verbal cues/safety issues), 5: Chair or W/C > Bed: Supervision (verbal cues/safety issues)  FIM - Locomotion: Wheelchair Distance: 50 Locomotion: Wheelchair: 0: Activity did not occur FIM - Locomotion: Ambulation Locomotion: Ambulation Assistive Devices: Designer, industrial/productWalker - Rolling Ambulation/Gait Assistance: 5: Supervision Locomotion: Ambulation: 5: Travels 150 ft or more with supervision/safety issues  Comprehension Comprehension Mode: Auditory Comprehension: 6-Follows complex conversation/direction: With extra time/assistive device  Expression Expression Mode: Verbal Expression: 7-Expresses complex ideas: With no assist  Social Interaction Social Interaction: 7-Interacts appropriately with others - No medications needed.  Problem Solving Problem Solving: 6-Solves complex problems: With extra time  Memory Memory: 7-Complete Independence: No helper  Medical Problem List and Plan: 1. Functional deficits secondary to severe B12 deficiency with peripheral polyneuropathy.   -pt continues to demonstrate neurological recovery 2. DVT Prophylaxis/Anticoagulation: SCDs.   3. Pain Management: Tylenol as needed 4. Mood/depression: Wellbutrin 150 mg daily, Xanax as needed. Provide emotional support\  -appears to be in good spirits at present  -neuropsych eval pending 5. Neuropsych: This patient is capable of making decisions on her own behalf. 6. Skin/Wound Care: Routine skin checks 7. Fluids/Electrolytes/Nutrition: Strict I and os.    -k+ supplement 8. Probable UTI. Urine cultures negative. 9. History of bronchial asthma. Albuterol nebulizer as needed. 10. Pancytopenia. Labs appear to be  trending up.   -counts continue to recover  -on b12 supplement  -to receive monthly injections also (Intrinsic factor ab +) LOS (Days) 9 A FACE TO FACE EVALUATION WAS PERFORMED  Shannie Kontos T 12/28/2013 7:51 AM

## 2013-12-28 NOTE — Progress Notes (Signed)
Occupational Therapy Session Note  Patient Details  Name: Jill Summers MRN: [id number] Date of Birth: Jul 20, 1985  Today's Date: 12/28/2013 OT Individual Time: 1400-1500 OT Individual Time Calculation (min): 60 min   Skilled Therapeutic Interventions/Progress Updates: Patient worked on home skills in ADL apartment walker level.   She maintained balance except for once when she attempted to back up and moved a little fast.   On the 3rd backwards step, she lost her balance but was able to right herself.  Her mother was present for the therapy session.    Therapy Documentation Precautions:  Precautions Precautions: Fall Restrictions Weight Bearing Restrictions: No  Pain:denied     See FIM for current functional status  Therapy/Group: Individual Therapy  Bud Faceickett, Andyn Sales [facility] 12/28/2013, 4:24 PM

## 2013-12-28 NOTE — Progress Notes (Signed)
Patient's last BM was 11/23. Sorbitol given 11/28 and 11/29. Refused suppository or enema. Prune juice offered and taken. Patient might consider suppository or enema if sorbitol still ineffective. Denies any abdominal pain or tenderness. Feels bloated. Passing gas. Will monitor.

## 2013-12-28 NOTE — Progress Notes (Signed)
Physical Therapy Session Note  Patient Details  Name: Jill Summers MRN: [id number] Date of Birth: 01-19-1986  Today's Date: 12/28/2013 PT Individual Time: 1535-1605 PT Individual Time Calculation (min): 30 min   Short Term Goals: Week 2:     Skilled Therapeutic Interventions/Progress Updates:    Pt received seated in w/c, agreeable to participate in therapy. Pt ambulated 200' to/from rehab gym w/ supervision. Session focused on balance training, Berg assessment. Pt completed 3' on [employer] Random Control Training on static and moving surface (L10), 5x Limits of stability medium on static, moving, and compliant (foam) surface. Pt able to complete LOS training with scores at 50-60% range overall, maintained 97% on random control training for 3'. Pt then completed Berg Balance Test, see details below. Pt scores 26/56, indicating high falls risk. Pt educated on interpretation of test, need for continued recovery and balance training in order to improve balance. Pt verbalized understanding. Pt left seated in w/c w/ all needs within reach.   Therapy Documentation Precautions:  Precautions Precautions: Fall Restrictions Weight Bearing Restrictions: No Vital Signs: Therapy Vitals Temp: 97.5 F (36.4 C) Temp Source: Oral Pulse Rate: 89 Resp: 18 BP: (!) 103/57 mmHg Patient Position (if appropriate): Sitting Oxygen Therapy SpO2: 100 % O2 Device: Not Delivered Pain:  No/denies pain Balance: Balance Balance Assessed: Yes Standardized Balance Assessment Standardized Balance Assessment: Berg Balance Test Berg Balance Test Sit to Stand: Able to stand  independently using hands Standing Unsupported: Able to stand safely 2 minutes Sitting with Back Unsupported but Feet Supported on Floor or Stool: Able to sit safely and securely 2 minutes Stand to Sit: Controls descent by using hands Transfers: Able to transfer safely, definite need of hands Standing Unsupported with Eyes Closed:  Needs help to keep from falling Standing Ubsupported with Feet Together: Able to place feet together independently and stand for 1 minute with supervision From Standing, Reach Forward with Outstretched Arm: Reaches forward but needs supervision From Standing Position, Pick up Object from Floor: Able to pick up shoe, needs supervision From Standing Position, Turn to Look Behind Over each Shoulder: Turn sideways only but maintains balance Turn 360 Degrees: Needs assistance while turning Standing Unsupported, Alternately Place Feet on Step/Stool: Needs assistance to keep from falling or unable to try Standing Unsupported, One Foot in Front: Loses balance while stepping or standing Standing on One Leg: Unable to try or needs assist to prevent fall Total Score: 26  See FIM for current functional status  Therapy/Group: Individual Therapy  Hosie SpangleGodfrey, Masayoshi Couzens 12/28/2013, 4:04 PM

## 2013-12-29 ENCOUNTER — Encounter (HOSPITAL_COMMUNITY): Payer: Medicaid Other | Admitting: Occupational Therapy

## 2013-12-29 ENCOUNTER — Inpatient Hospital Stay (HOSPITAL_COMMUNITY): Payer: Medicaid Other | Admitting: Physical Therapy

## 2013-12-29 ENCOUNTER — Inpatient Hospital Stay (HOSPITAL_COMMUNITY): Payer: Medicaid Other

## 2013-12-29 NOTE — Progress Notes (Signed)
Occupational Therapy Session Note  Patient Details  Name: Jill Summers MRN: [id number] Date of Birth: 11/07/1985  Today's Date: 12/29/2013 OT Individual Time: 1000-1100 OT Calculated Individual Time (min): 60 min   Short Term Goals: Week 1:  OT Short Term Goal 1 (Week 1): Pt will perform LB dressing with Min A in order to increase I in self care.  OT Short Term Goal 2 (Week 1): Pt will perform shower transfer with Min A in order to increase I in functional transfers. OT Short Term Goal 3 (Week 1): Pt will perform 15 minutes of functional activities without rest break in order to increase endurance for ADL and IADL tasks.  Skilled Therapeutic Interventions/Progress Updates:  Skilled OT session focusing on ADL retraining, functional ambulation, functional transfers, dynamic standing balance, standing tolerance, safety awareness with RW, and activity tolerance/endurance. Pt supine in bed when arriving, reporting no pain. Pt transferred to EOB and then ambulated with RW to gather clothing, towels, and soap. Pt ambulated to shower in rehab apartment and transferred to TTB. Pt completed UB/LB bathing and donned gowns. Pt ambulated to room and completed UB/LB dressing and applied lotion at EOB. Pt completed face washing and tooth brushing while standing at sink with RW. Pt ambulated to rehab gym and completed dynamic standing activities on Wii, standing for 16 min 12 sec with no rest break. Pt able to self-correct LOB and used RW for stability intermittently. Pt ambulated to room and sat on EOB. Discussed d/c planning, safety with RW, and energy conservation strategies. Pt seated at EOB when leaving. Pt at overall mod I level during this session, and made mod I level for room (sign displayed on door).   Therapy Documentation Precautions:  Precautions Precautions: Fall Restrictions Weight Bearing Restrictions: No  See FIM for current functional status  Therapy/Group: Individual  Therapy  Ruhi Kopke [name] 12/29/2013, 7:22 AM

## 2013-12-29 NOTE — Plan of Care (Signed)
Problem: RH Balance Goal: LTG Patient will maintain dynamic standing with ADLs (OT) LTG: Patient will maintain dynamic standing balance with assist during activities of daily living (OT)  Outcome: Completed/Met Date Met:  12/29/13  Problem: RH Grooming Goal: LTG Patient will perform grooming w/assist,cues/equip (OT) LTG: Patient will perform grooming with assist, with/without cues using equipment (OT)  Outcome: Completed/Met Date Met:  12/29/13  Problem: RH Bathing Goal: LTG Patient will bathe with assist, cues/equipment (OT) LTG: Patient will bathe specified number of body parts with assist with/without cues using equipment (position) (OT)  Outcome: Completed/Met Date Met:  12/29/13  Problem: RH Dressing Goal: LTG Patient will perform upper body dressing (OT) LTG Patient will perform upper body dressing with assist, with/without cues (OT).  Outcome: Completed/Met Date Met:  12/29/13 Goal: LTG Patient will perform lower body dressing w/assist (OT) LTG: Patient will perform lower body dressing with assist, with/without cues in positioning using equipment (OT)  Outcome: Completed/Met Date Met:  12/29/13  Problem: RH Toileting Goal: LTG Patient will perform toileting w/assist, cues/equip (OT) LTG: Patient will perform toiletiing (clothes management/hygiene) with assist, with/without cues using equipment (OT)  Outcome: Completed/Met Date Met:  12/29/13  Problem: RH Simple Meal Prep Goal: LTG Patient will perform simple meal prep w/assist (OT) LTG: Patient will perform simple meal prep with assistance, with/without cues (OT).  Outcome: Completed/Met Date Met:  12/29/13  Problem: RH Laundry Goal: LTG Patient will perform laundry w/assist, cues (OT) LTG: Patient will perform laundry with assistance, with/without cues (OT).  Outcome: Completed/Met Date Met:  12/29/13  Problem: RH Toilet Transfers Goal: LTG Patient will perform toilet transfers w/assist (OT) LTG: Patient will perform  toilet transfers with assist, with/without cues using equipment (OT)  Outcome: Completed/Met Date Met:  12/29/13  Problem: RH Tub/Shower Transfers Goal: LTG Patient will perform tub/shower transfers w/assist (OT) LTG: Patient will perform tub/shower transfers with assist, with/without cues using equipment (OT)  Outcome: Completed/Met Date Met:  12/29/13

## 2013-12-29 NOTE — Progress Notes (Signed)
Physical Therapy Session Note  Patient Details  Name: Jill Summers MRN: [id number] Date of Birth: October 13, 1985  Today's Date: 12/29/2013 PT Individual Time: 1400-1503 PT Individual Time Calculation (min): 63 min   Short Term Goals: Week 1:  PT Short Term Goal 1 (Week 1): Pt to maintain dynamic standing balance without B UE support and min A PT Short Term Goal 2 (Week 1): Pt to perform SPT bed<>w/c with RW and supervison, 50% of time PT Short Term Goal 3 (Week 1): Pt to propel w/c 200' with B UE and supervision demonstrating fluid and consistent pace PT Short Term Goal 4 (Week 1): Pt to ambulate 100' with RW and superivsion, 50% of time PT Short Term Goal 5 (Week 1): Pt to negotiate up/down 2 steps with RW and min A  Skilled Therapeutic Interventions/Progress Updates:    Pt received seated in w/c; agreeable to therapy. Session focused on community mobility and dynamic standing balance. Pt performed gait >250' x2 trials in community environment with rolling walker and mod I. In community environment, performed sit<>stand transfers from furniture of varying heights/surfaces with rolling walker and mod I. Discussed strategies for maximizing safety with community mobility, such as energy conservation, safest furniture options. Pt demonstrated effective within-session carryover. Returned to rehab unit, where pt completed Dynamic Gait Index (DGI) with score of 16/24; see below for detailed findings. Educated pt on findings, functional implications. Pt verbalized understanding.  Pt expressing concern regarding no follow up physical therapy services available upon discharge. Therefore, remainder of this session focused on expanding upon HEP to include dynamic standing balance exercises with focus on items of Berg Balance Scale with which pt required most assistance. Explained, demonstrated, and provided paper handout for the following home exercises: sit<>stand without use of UE's; progression from  standing with feet together > semi tandem > tandem stance; alternating toe taps; and single limb stance. Educated pt on how to modify, progress each activity to increase challenge within safe parameters. Emphasized importance of performing exercises in front of bed/sturdy chair with stable countertop in front of pt for safety. Pt verbalized understanding and gave effective return demonstration. Session ended in pt room, where pt was left seated EOB with all needs within reach.  Therapy Documentation Precautions:  Precautions Precautions: Fall Restrictions Weight Bearing Restrictions: No Vital Signs: Therapy Vitals Temp: 98.4 F (36.9 C) Temp Source: Oral Pulse Rate: (!) 55 Resp: 18 BP: (!) 99/49 mmHg Patient Position (if appropriate): Lying Oxygen Therapy SpO2: 100 % O2 Device: Not Delivered Pain: Pain Assessment Pain Assessment: No/denies pain Mobility: Bed Mobility Bed Mobility: Supine to Sit;Sit to Supine Supine to Sit: 7: Independent Sit to Supine: 7: Independent Transfers Sit to Stand: 6: Modified independent (Device/Increase time) Stand to Sit: 6: Modified independent (Device/Increase time) Locomotion : Ambulation Ambulation/Gait Assistance: 6: Modified independent (Device/Increase time)  Balance: Standardized Balance Assessment Standardized Balance Assessment: Dynamic Gait Index Dynamic Gait Index Level Surface: Mild Impairment Change in Gait Speed: Mild Impairment Gait with Horizontal Head Turns: Mild Impairment Gait with Vertical Head Turns: Mild Impairment Gait and Pivot Turn: Mild Impairment Step Over Obstacle: Mild Impairment Step Around Obstacles: Mild Impairment Steps: Mild Impairment Total Score: 16  See FIM for current functional status  Therapy/Group: Individual Therapy  Yeni Jiggetts, Lorenda IshiharaBlair A 12/29/2013, 3:54 PM

## 2013-12-29 NOTE — Progress Notes (Signed)
Physical Therapy Discharge Summary  Patient Details  Name: RAYHANA SLIDER MRN: [id number] Date of Birth: [birth year]   Patient has met 9 of 9 long term goals due to improved activity tolerance, improved balance, improved postural control, increased strength, ability to compensate for deficits, functional use of  right lower extremity and left lower extremity and improved coordination.  Patient to discharge at an ambulatory level S for longer distances with RW; mod I for household distances with RW.  Family education not formally completed due to pt leaving at modified independent level in the household with RW. Family to provide intermittent supervision for stairs, community mobility, and car transfers.   Reasons goals not met: n/a all goals met at this time.  Recommendation:  Patient will benefit from ongoing skilled PT services in home health setting to continue to advance safe functional mobility, address ongoing impairments in gait, coordination, balance, strength, endurance, proprioception, motor control, and minimize fall risk.  Equipment: RW  Reasons for discharge: treatment goals met and discharge from hospital  Patient/family agrees with progress made and goals achieved: Yes  PT Discharge Precautions/Restrictions Precautions Precautions: Fall Restrictions Weight Bearing Restrictions: No Cognition Overall Cognitive Status: Within Functional Limits for tasks assessed Safety/Judgment: Appears intact Sensation Sensation Light Touch: Appears Intact (reports tingling in toes Bilaterally) Proprioception: Impaired by gross assessment Coordination Gross Motor Movements are Fluid and Coordinated: No Coordination and Movement Description: decreased coordination in BLE; especially as fatigues Motor  Motor Motor: Ataxia (Paraparesis) Motor - Skilled Clinical Observations: decreased B LE strength and coordination  Locomotion  Stairs / Additional Locomotion Curb: 5: Supervision  (with RW)  S long distance gait with RW Mod I short household distances with RW Trunk/Postural Assessment  Cervical Assessment Cervical Assessment: Within Functional Limits Thoracic Assessment Thoracic Assessment: Within Functional Limits Lumbar Assessment Lumbar Assessment: Within Functional Limits Postural Control Postural Control: Deficits on evaluation Righting Reactions: decreased ability in standing without UE support  Balance Balance Balance Assessed: Yes Static Sitting Balance Static Sitting - Level of Assistance: 7: Independent Dynamic Sitting Balance Dynamic Sitting - Level of Assistance: 6: Modified independent (Device/Increase time) Static Standing Balance Static Standing - Level of Assistance: 6: Modified independent (Device/Increase time) (with RW) Dynamic Standing Balance Dynamic Standing - Level of Assistance: 6: Modified independent (Device/Increase time);5: Stand by assistance (with RW) Extremity Assessment      RLE Strength RLE Overall Strength: Deficits Right Hip Flexion: 3+/5 Right Knee Flexion: 4/5 Right Knee Extension: 4/5 Right Ankle Dorsiflexion: 3+/5 Right Ankle Plantar Flexion: 3+/5 LLE Strength LLE Overall Strength: Deficits Left Hip Flexion: 3/5 Left Knee Flexion: 4/5 Left Knee Extension: 3+/5 Left Ankle Dorsiflexion: 3+/5 Left Ankle Plantar Flexion: 3+/5  See FIM for current functional status  Canary Brim [facility] 12/30/2013, 7:42 AM

## 2013-12-29 NOTE — Progress Notes (Signed)
Occupational Therapy Discharge Summary  Patient Details  Name: Jill Summers MRN: [id number] Date of Birth: 07-18-1985  Today's Date: 12/29/2013   Patient has met 10 of 10 long term goals due to improved activity tolerance, improved balance, improved awareness and improved coordination.  Patient to discharge at overall Modified Independent level.  Patient's mother available for assistance with IADLs intermittently.    Reasons goals not met: N/a at this time  Recommendation:  No further skilled OT services recommended at this time.  Equipment: TTB, 3-in-1 commode  Reasons for discharge: treatment goals met and discharge from hospital  Patient/family agrees with progress made and goals achieved: Yes  OT Discharge Precautions/Restrictions  Precautions Precautions: Fall Restrictions Weight Bearing Restrictions: No  Pain Pain Assessment Pain Assessment: No/denies pain Pain Score: 3  Pain Location: Knee (bil knee caps) Pain Onset: With Activity Pain Intervention(s):  (refused to request meds)   ADL See FIM  Vision/Perception  Vision- History Baseline Vision/History: No visual deficits Patient Visual Report: No change from baseline  Cognition Overall Cognitive Status: Within Functional Limits for tasks assessed Arousal/Alertness: Awake/alert Orientation Level: Oriented X4 Safety/Judgment: Appears intact   Sensation Sensation Light Touch: Appears Intact Proprioception: Impaired by gross assessment Coordination Gross Motor Movements are Fluid and Coordinated: No Coordination and Movement Description: decreased coordination in BLE; especially as fatigues   Motor  Motor Motor: Ataxia Motor - Skilled Clinical Observations: decreased B LE strength and coordination   Mobility  Bed Mobility Bed Mobility: Supine to Sit;Sit to Supine Supine to Sit: 7: Independent Sit to Supine: 7: Independent Transfers Transfers: Sit to Stand;Stand to Sit Sit to Stand: 6:  Modified independent (Device/Increase time) Stand to Sit: 6: Modified independent (Device/Increase time)   Trunk/Postural Assessment  Cervical Assessment Cervical Assessment: Within Functional Limits Thoracic Assessment Thoracic Assessment: Within Functional Limits Lumbar Assessment Lumbar Assessment: Within Functional Limits Postural Control Postural Control: Deficits on evaluation Righting Reactions: decreased ability in standing without UE support   Balance Balance Balance Assessed: Yes Static Sitting Balance Static Sitting - Level of Assistance: 7: Independent Dynamic Sitting Balance Dynamic Sitting - Level of Assistance: 6: Modified independent (Device/Increase time) Static Standing Balance Static Standing - Level of Assistance: 6: Modified independent (Device/Increase time) (with RW) Dynamic Standing Balance Dynamic Standing - Level of Assistance: 6: Modified independent (Device/Increase time);5: Stand by assistance (with RW)   Extremity/Trunk Assessment RUE Assessment RUE Assessment: Within Functional Limits LUE Assessment LUE Assessment: Within Functional Limits  See FIM for current functional status  Jeric Slagel [name] 12/29/2013, 12:05 PM

## 2013-12-29 NOTE — Discharge Summary (Signed)
Discharge summary job [id number]

## 2013-12-29 NOTE — Plan of Care (Signed)
Problem: RH BOWEL ELIMINATION Goal: RH STG MANAGE BOWEL WITH ASSISTANCE STG Manage Bowel with min Assistance.  Outcome: Progressing Goal: RH STG MANAGE BOWEL W/MEDICATION W/ASSISTANCE STG Manage Bowel with Medication with min Assistance.  Outcome: Not Progressing Required supp.   Problem: RH BLADDER ELIMINATION Goal: RH STG MANAGE BLADDER WITH ASSISTANCE STG Manage Bladder With min Assistance  Outcome: Progressing  Problem: RH SKIN INTEGRITY Goal: RH STG SKIN FREE OF INFECTION/BREAKDOWN Remain free from infection and breakdown while on rehab with min assist  Outcome: Progressing Goal: RH STG MAINTAIN SKIN INTEGRITY WITH ASSISTANCE STG Maintain Skin Integrity With min Assistance.  Outcome: Progressing  Problem: RH SAFETY Goal: RH STG ADHERE TO SAFETY PRECAUTIONS W/ASSISTANCE/DEVICE STG Adhere to Safety Precautions With min Assistance/Device.  Outcome: Progressing Goal: RH STG DECREASED RISK OF FALL WITH ASSISTANCE STG Decreased Risk of Fall With min Assistance.  Outcome: Progressing  Problem: RH PAIN MANAGEMENT Goal: RH STG PAIN MANAGED AT OR BELOW PT'S PAIN GOAL <2 on a 0-10 scale  Outcome: Progressing

## 2013-12-29 NOTE — Plan of Care (Signed)
Problem: RH BOWEL ELIMINATION Goal: RH STG MANAGE BOWEL WITH ASSISTANCE STG Manage Bowel with min Assistance.  Outcome: Progressing Goal: RH STG MANAGE BOWEL W/MEDICATION W/ASSISTANCE STG Manage Bowel with Medication with min Assistance.  Outcome: Progressing  Problem: RH BLADDER ELIMINATION Goal: RH STG MANAGE BLADDER WITH ASSISTANCE STG Manage Bladder With min Assistance  Outcome: Progressing  Problem: RH SKIN INTEGRITY Goal: RH STG SKIN FREE OF INFECTION/BREAKDOWN Remain free from infection and breakdown while on rehab with min assist  Outcome: Progressing Goal: RH STG MAINTAIN SKIN INTEGRITY WITH ASSISTANCE STG Maintain Skin Integrity With min Assistance.  Outcome: Progressing  Problem: RH SAFETY Goal: RH STG ADHERE TO SAFETY PRECAUTIONS W/ASSISTANCE/DEVICE STG Adhere to Safety Precautions With min Assistance/Device.  Outcome: Progressing Goal: RH STG DECREASED RISK OF FALL WITH ASSISTANCE STG Decreased Risk of Fall With min Assistance.  Outcome: Progressing  Problem: RH PAIN MANAGEMENT Goal: RH STG PAIN MANAGED AT OR BELOW PT'S PAIN GOAL <2 on a 0-10 scale  Outcome: Progressing     

## 2013-12-29 NOTE — Progress Notes (Signed)
Physical Therapy Session Note  Patient Details  Name: Jill Summers MRN: [id number] Date of Birth: 10-19-1985  Today's Date: 12/29/2013 PT Individual Time: 0800-0900 PT Individual Time Calculation (min): 60 min   Short Term Goals: Week 1:  PT Short Term Goal 1 (Week 1): Pt to maintain dynamic standing balance without B UE support and min A PT Short Term Goal 2 (Week 1): Pt to perform SPT bed<>w/c with RW and supervison, 50% of time PT Short Term Goal 3 (Week 1): Pt to propel w/c 200' with B UE and supervision demonstrating fluid and consistent pace PT Short Term Goal 4 (Week 1): Pt to ambulate 100' with RW and superivsion, 50% of time PT Short Term Goal 5 (Week 1): Pt to negotiate up/down 2 steps with RW and min A  Skilled Therapeutic Interventions/Progress Updates:   Session focused on functional gait with RW to/from therapy with S, household gait in ADL apartment with RW mod I, furniture transfers and bed mobility in ADL apartment mod I overall, reviewed results of Berg and importance of use of RW at all times, floor transfers with S (pt able to independently verbalize and recall what to do in case of fall/emergency) for fall prevention, gait trials without AD (pt request in parallel bars but not in open space with steady A; forward, retro gait, lateral sidestepping) for challenge of balance, hip abduction strengthening while laterally kicking bosu ball for increase weight, neuro re-ed for balance strategies on compliant surface for alternating toe taps with UE support and heel/toe raises, curb step negotiation for home entry with RW with overall S x 2 reps, stair negotiation for community mobility up/down 12 steps with bilateral rails (cues to make sure to place foot fulling on step), and Nustep for general strengthening and endurance x 10 min on level 5.  Pt denies concerns in regards to planned d/c tomorrow.   D/c strength and balance assessment performed (see d/c summary for details).    Therapy Documentation Precautions:  Precautions Precautions: Fall Restrictions Weight Bearing Restrictions: No  Pain:  Denies pain.   See FIM for current functional status  Therapy/Group: Individual Therapy  Jill Summers, Jill Summers 12/29/2013, 9:18 AM

## 2013-12-29 NOTE — Discharge Summary (Signed)
NAMChristiana Summers:  Summers, Jill             ACCOUNT NO.:  [id number]  MEDICAL RECORD NO.:  [id number]  LOCATION:  4M10C                        FACILITY:  [facility]  PHYSICIAN:  Ranelle OysterZachary T. Swartz, M.D.DATE OF BIRTH:  [birth year]  DATE OF ADMISSION:  12/19/2013 DATE OF DISCHARGE:  12/30/2013                              DISCHARGE SUMMARY   DISCHARGE DIAGNOSES: 1. Functional deficits secondary to severe B12 deficiency with     peripheral polyneuropathy. 2. Sequential compression devices for deep venous thrombosis     prophylaxis. 3. Pancytopenia.  HISTORY OF PRESENT ILLNESS:  This is a 28 year old right-handed female with history of depression, bronchial asthma, as well as recent cellulitis of the umbilicus, treated with Bactrim.  Recent MRSA PCR screening 2 months ago positive as well as questionable untreated previously B12 deficiency.  The patient lives with children, independent prior to admission.  Also lives with her mother who had been assisting with the children.  Admitted on December 16, 2013, with bouts of dizziness over the past 2 weeks as well as lower body weakness and numbness.  She denied any falls.  No change in bowel or bladder. Cranial CT scan negative.  MRI thoracic, lumbar, cervical spine showed multiple bilateral thoracic and cord lesions in the lateral white matter most compatible with demyelinating disease of question etiology with workup per Neurology Services.  Felt cord lesions most likely manifestations of B12 deficiency.  MRI of the brain December 18, 2013, showed less than 10 nonspecific subcentimeter supratentorial white matter T2 hyperintensities in an atypical distribution for classic demyelination.  Found to have severe pancytopenia with WBC 1.4, ANC 0.4, hemoglobin 5.3, MCV 101.9, and platelets 37,000.  Hematology Service is consulted.  She was transfused.  Etiology felt to be secondary to B12 deficiency.  Currently, maintained on supplementation with  followup Hematology Services for monthly B12 injections.  Physical and Occupational Therapy ongoing.  The patient was admitted for comprehensive rehab program.  PAST MEDICAL HISTORY:  See discharge diagnoses.  SOCIAL HISTORY:  Lives with children and mother.  Functional history prior to admission independent.  Functional status upon admission to rehab services was moderate assist, ambulate 7 feet with a rolling walker, minimal assist supine to sit, min to mod assist for activities of daily living.  PHYSICAL EXAMINATION:  VITAL SIGNS:  Blood pressure 129/75, pulse 86, temperature 99, respirations 18. GENERAL:  This was an alert female, oriented x3. LUNGS:  Clear to auscultation. CARDIAC:  Regular rate and rhythm. ABDOMEN:  Soft, nontender.  Good bowel sounds. NEUROLOGIC:  Motor strength 5/5 bilateral deltoid biceps, triceps, and grip; 3/5 hip flexors; 4/5 knee extensors; 4/5 ankle dorsiflexor and plantar flexor.  REHABILITATION HOSPITAL COURSE:  Hospital course patient was admitted to inpatient rehab services with therapies initiated on a 3-hour daily basis consisting of Physical Therapy, Occupational Therapy, and rehabilitation nursing.  The following issues were addressed during the patient's rehabilitation stay.  Pertaining to Jill Summers functional deficits secondary to severe B12 deficiency with peripheral polyneuropathy, she continued to make neurological recovery.  She would follow up with Hematology Services for monthly B12 injections.  She continued on sequential compression devices for DVT prophylaxis. Wellbutrin and Xanax for history of depression with emotional  support provided she was participating with full therapies.  No other bowel or bladder disturbances.  The patient received weekly collaborative interdisciplinary team conferences to discuss estimated length of stay, family teaching, and any barriers to her discharge.  Sessions focused on balance training, Berg  balance testing 26/56, ambulating 200 feet with a rolling walker, activities of daily living, she maintained balance throughout her sessions.  Her mother participated with full therapies and teaching.  She completed upper and lower body bathing in a seated position with distance supervision.  She continued to make good recovery and was discharged to home with home health physical and occupational therapy.  DISCHARGE MEDICATIONS:  Xanax 0.25 mg p.o. t.i.d. as needed, B12 complex 1 tablet p.o. daily, Wellbutrin 150 mg p.o. daily, Tylenol as needed.  DIET:  Regular.  SPECIAL INSTRUCTIONS:  The patient would follow up with Dr. Faith RogueZachary Swartz at the outpatient rehab service office as directed; Dr. Ruthann CancerGustav Magrinat, Hematology Services, call for appointment.  Case management would make arrangements to have a PCP for patient.     Jill Dollaraniel Tanish Summers, P.A.   ______________________________ Ranelle OysterZachary T. Swartz, M.D.    DA/MEDQ  D:  12/29/2013  T:  12/29/2013  Job:  [id number]  cc:   Lowella DellGustav C Magrinat, M.D. Ranelle OysterZachary T. Swartz, M.D.

## 2013-12-29 NOTE — Progress Notes (Signed)
Complained of feeling bloated, agreed to try supp. Paged Dr. Riley KillSwartz, order for supp. and started on scheduled Senna S every HS. Supp given at 2200 with large results. Wanted to start Senna S Monday night. Complains of numbness and tingling to BLE's. Rested without complaint of during night. Jill Summers, Ocie Tino A

## 2013-12-29 NOTE — Progress Notes (Signed)
Physical Therapy Session Note  Patient Details  Name: Jill Summers MRN: [id number] Date of Birth: Apr 25, 1985  Today's Date: 12/29/2013 PT Individual Time: 1110-1210 PT Individual Time Calculation (min): 60 min   Short Term Goals: Week 1:  PT Short Term Goal 1 (Week 1): Pt to maintain dynamic standing balance without B UE support and min A PT Short Term Goal 2 (Week 1): Pt to perform SPT bed<>w/c with RW and supervison, 50% of time PT Short Term Goal 3 (Week 1): Pt to propel w/c 200' with B UE and supervision demonstrating fluid and consistent pace PT Short Term Goal 4 (Week 1): Pt to ambulate 100' with RW and superivsion, 50% of time PT Short Term Goal 5 (Week 1): Pt to negotiate up/down 2 steps with RW and min A Week 2:     Skilled Therapeutic Interventions/Progress Updates:  Gait room to PT gym x 195' with supervision, RW, focus on bil knee extension without hyperextension. Gait returning to room at end of tx-  pt demonstrated improved knee control bil, without any instance of knee hyperextension. She stated her ankles "felt looser".   Fall prevention exs per hand out standing: hip abd, mini squats, hamstrings, calf raises, toe raises; in sitting: R and L knee extension without hyperext, isometric ankle DF at end range.  10 x 1 each. Single limb bridging biased toward LE off edge of bed, x 5 x 2 each L and R.  Sit>< stand with and without use of UEs focusing on wt shifting, knee control.   Educated pt about importance of hip and trunk strengthening as she ambulates more often and for longer distances, to decrease stress on quads and bil knees. Discussed with primary PT, then added hand out of single limb bridging to HEP.  Pt continued to have bil patellar soreness, and was receptive to trying cryotherapy.  bil ice packs applied to knees with pt sitting on EOB.  PT educated pt on 20 min on/20 min off PRN for knee soreness.    Therapy Documentation Precautions:   Precautions Precautions: Fall Restrictions Weight Bearing Restrictions: No   Pain: Pain Assessment Pain Assessment: No/denies pain Pain Score: 3  Pain Location: Knee (bil knee caps) Pain Onset: With Activity Pain Intervention(s):  (refused to request meds) Mobility: Bed Mobility Bed Mobility: Supine to Sit;Sit to Supine Supine to Sit: 7: Independent Sit to Supine: 7: Independent Transfers Sit to Stand: 6: Modified independent (Device/Increase time) Stand to Sit: 6: Modified independent (Device/Increase time)      See FIM for current functional status  Therapy/Group: Individual Therapy  Demontez Novack 12/29/2013, 1:09 PM

## 2013-12-29 NOTE — Progress Notes (Signed)
[place] PHYSICAL MEDICINE & REHABILITATION     PROGRESS NOTE    Subjective/Complaints: No complaints. Happy to be going home tomorrow ROS: leg numbness still present but better  Objective: Vital Signs: Blood pressure 97/62, pulse 95, temperature 97.8 F (36.6 C), temperature source Oral, resp. rate 18, height 5\' 2"  (1.575 m), weight 95.21 kg (209 lb 14.4 oz), last menstrual period 12/13/2013, SpO2 100 %. No results found. No results for input(s): WBC, HGB, HCT, PLT in the last 72 hours. No results for input(s): NA, K, CL, GLUCOSE, BUN, CREATININE, CALCIUM in the last 72 hours.  Invalid input(s): CO CBG (last 3)  No results for input(s): GLUCAP in the last 72 hours.  Wt Readings from Last 3 Encounters:  12/24/13 95.21 kg (209 lb 14.4 oz)  12/16/13 97.2 kg (214 lb 4.6 oz)  12/17/13 99.791 kg (220 lb)    Physical Exam:  Constitutional: She is oriented to person, place, and time. She appears well-developed.  HENT: oral mucosa pink and moist, dentition fair Head: Normocephalic.  Eyes: EOM are normal.  Neck: Normal range of motion. Neck supple. No thyromegaly present.  Cardiovascular: Normal rate and regular rhythm. no murmurs Respiratory: Effort normal and breath sounds normal. No respiratory distress.  GI: Bowel sounds are normal. She exhibits no distension. Non tender   Skin: Skin is warm and dry.  Neuro: Alert and oriented. Good insight and awareness, CN exam intact Motor strength is 5/5 bilateral deltoids, biceps, triceps, grip 4/5 in the hip flexors and, 4+/5 knee extensors 4+/5 ankle dorsiflexor and plantarflexors.  Sensation reduced to light touch in both legs below the mid thighs---improving. Senses pain Reduced proprioception in both legs as well in same distribution---improved. Can sense gross pain. No sensory findings in upper extremities and no sensory level in abdomen. Deep tendon reflexes 1+ at patella and absent at ankles bilateral lower  extremities Psych: normal affect, behavior. Musc: no edema, no gross pain with ROM/movement  Assessment/Plan: 1. Functional deficits secondary to severe B12 deficiency polyneuropathy  which require 3+ hours per day of interdisciplinary therapy in a comprehensive inpatient rehab setting. Physiatrist is providing close team supervision and 24 hour management of active medical problems listed below. Physiatrist and rehab team continue to assess barriers to discharge/monitor patient progress toward functional and medical goals.      FIM: FIM - Bathing Bathing Steps Patient Completed: Chest, Right Arm, Left Arm, Abdomen, Front perineal area, Buttocks, Right upper leg, Left upper leg, Right lower leg (including foot), Left lower leg (including foot) Bathing: 5: Supervision: Safety issues/verbal cues (seated on tub transfer bench)  FIM - Upper Body Dressing/Undressing Upper body dressing/undressing steps patient completed: Thread/unthread right sleeve of pullover shirt/dresss, Thread/unthread left sleeve of pullover shirt/dress, Put head through opening of pull over shirt/dress, Pull shirt over trunk Upper body dressing/undressing: 6: Assistive device (Comment) FIM - Lower Body Dressing/Undressing Lower body dressing/undressing steps patient completed: Thread/unthread right pants leg, Thread/unthread left pants leg, Pull pants up/down, Don/Doff right sock, Don/Doff left sock, Don/Doff right shoe, Don/Doff left shoe, Fasten/unfasten right shoe, Fasten/unfasten left shoe Lower body dressing/undressing: 5: Supervision: Safety issues/verbal cues  FIM - Toileting Toileting steps completed by patient: Adjust clothing prior to toileting, Performs perineal hygiene, Adjust clothing after toileting Toileting Assistive Devices: Grab bar or rail for support Toileting: 5: Supervision: Safety issues/verbal cues  FIM - Diplomatic Services operational officerToilet Transfers Toilet Transfers Assistive Devices: Art gallery managerWalker Toilet Transfers: 4-To  toilet/BSC: Min A (steadying Pt. > 75%), 4-From toilet/BSC: Min A (steadying Pt. >  75%)  FIM - Bed/Chair Transfer Bed/Chair Transfer Assistive Devices: Therapist, occupationalWalker Bed/Chair Transfer: 5: Chair or W/C > Bed: Supervision (verbal cues/safety issues), 5: Bed > Chair or W/C: Supervision (verbal cues/safety issues)  FIM - Locomotion: Wheelchair Distance: 50 Locomotion: Wheelchair: 0: Activity did not occur FIM - Locomotion: Ambulation Locomotion: Ambulation Assistive Devices: Designer, industrial/productWalker - Rolling Ambulation/Gait Assistance: 5: Supervision Locomotion: Ambulation: 5: Travels 150 ft or more with supervision/safety issues  Comprehension Comprehension Mode: Auditory Comprehension: 6-Follows complex conversation/direction: With extra time/assistive device  Expression Expression Mode: Verbal Expression: 7-Expresses complex ideas: With no assist  Social Interaction Social Interaction: 7-Interacts appropriately with others - No medications needed.  Problem Solving Problem Solving: 6-Solves complex problems: With extra time  Memory Memory: 7-Complete Independence: No helper  Medical Problem List and Plan: 1. Functional deficits secondary to severe B12 deficiency with peripheral polyneuropathy.   -pt continues to demonstrate neurological recovery 2. DVT Prophylaxis/Anticoagulation: SCDs.   3. Pain Management: Tylenol as needed 4. Mood/depression: Wellbutrin 150 mg daily, Xanax as needed. Provide emotional support\  -appears to be in good spirits at present  -neuropsych eval appreciated 5. Neuropsych: This patient is capable of making decisions on her own behalf. 6. Skin/Wound Care: Routine skin checks 7. Fluids/Electrolytes/Nutrition: Strict I and os.    -k+ supplement 8. Probable UTI. Urine cultures negative. 9. History of bronchial asthma. Albuterol nebulizer as needed. 10. Pancytopenia. Labs appear to be trending up.   -counts continue to recover  -on b12 supplement---level now normal  -to  receive monthly injections also (Intrinsic factor ab +) LOS (Days) 10 A FACE TO FACE EVALUATION WAS PERFORMED  Excell Neyland T 12/29/2013 7:48 AM

## 2013-12-30 MED ORDER — ALPRAZOLAM 0.25 MG PO TABS: 0.2500 mg | ORAL_TABLET | Freq: Three times a day (TID) | ORAL | Status: DC | PRN

## 2013-12-30 MED ORDER — B COMPLEX-C PO TABS: 1.0000 | ORAL_TABLET | Freq: Every day | ORAL | Status: DC

## 2013-12-30 MED ORDER — BUPROPION HCL ER (XL) 150 MG PO TB24: 150.0000 mg | ORAL_TABLET | Freq: Every day | ORAL | Status: DC

## 2013-12-30 NOTE — Progress Notes (Signed)
Pt. Got d/c instructions,follow up appointments and prescriptions.Pt. Ready to go home with her mom.

## 2013-12-30 NOTE — Discharge Instructions (Signed)
Inpatient Rehab Discharge Instructions  Jill ArthurCandace L Summers Discharge date and time: No discharge date for patient encounter.   Activities/Precautions/ Functional Status: Activity: activity as tolerated Diet: regular diet Wound Care: none needed Functional status:  ___ No restrictions     ___ Walk up steps independently _x__ 24/7 supervision/assistance   ___ Walk up steps with assistance ___ Intermittent supervision/assistance  ___ Bathe/dress independently ___ Walk with walker     ___ Bathe/dress with assistance ___ Walk Independently    ___ Shower independently ___ Walk with assistance    ___ Shower with assistance ___ No alcohol     ___ Return to work/school ________    COMMUNITY REFERRALS UPON DISCHARGE:    Medical Equipment/Items Ordered: walker, bedside commode and tub bench                                                     Agency/Supplier: [facility] @ [phone]   GENERAL COMMUNITY RESOURCES FOR PATIENT/FAMILY:   Mental Health:  [facility] with [facility] to resume management of meds and counseling needs @  [phone]                                            If you would like to change your assigned primary medical care (under [employer] [facility] [facility]) from [facility] to [facility], then you need to contact your Medicaid Case Manager and make this request.       Special Instructions: Follow-up hematology services [phone] monthly B-12 injections   My questions have been answered and I understand these instructions. I will adhere to these goals and the provided educational materials after my discharge from the hospital.  Patient/Caregiver Signature _______________________________ Date __________  Clinician Signature _______________________________________ Date __________  Please bring this form and your medication list with you to all your follow-up doctor's appointments.

## 2013-12-30 NOTE — Progress Notes (Signed)
Social Work  Discharge Note  The overall goal for the admission was met for:   Discharge location: Yes - home with young children - mother lives next door and can assist  Length of Stay: Yes - 11 days  Discharge activity level: Yes - modified independent  Home/community participation: Yes  Services provided included: MD, RD, PT, OT, RN, TR, Pharmacy, Neuropsych and SW  Financial Services: Medicaid  Follow-up services arranged: DME: walker, 3n1 commode and tub bench - [place] and Patient/Family has no preference for HH/DME agencies  Comments (or additional information):  Pt aware that with her diagnosis, Medicaid does not cover any home therapies.  We did discuss need to resume her follow up at [facility] for her major depression.  Also referred to [facility] for new primary care.    Patient/Family verbalized understanding of follow-up arrangements: Yes  Individual responsible for coordination of the follow-up plan: pt  Confirmed correct DME delivered: Aaliyah Gavel 12/30/2013    Hisashi Amadon

## 2013-12-30 NOTE — Progress Notes (Signed)
[facility]     PROGRESS NOTE    Subjective/Complaints: No new complaints. Happy to be going home. Realizes that there are still challenges ahead. ROS: leg numbness    Objective: Vital Signs: Blood pressure 116/58, pulse 59, temperature 98.4 F (36.9 C), temperature source Oral, resp. rate 18, height _0  (1.575 m), weight 95.21 kg (209 lb 14.4 oz), last menstrual period 12/13/2013, SpO2 100 %. No results found. No results for input(s): WBC, HGB, HCT, PLT in the last 72 hours. No results for input(s): NA, K, CL, GLUCOSE, BUN, CREATININE, CALCIUM in the last 72 hours.  Invalid input(s): CO CBG (last 3)  No results for input(s): GLUCAP in the last 72 hours.  Wt Readings from Last 3 Encounters:  12/24/13 95.21 kg (209 lb 14.4 oz)  12/16/13 97.2 kg (214 lb 4.6 oz)  12/17/13 99.791 kg (220 lb)    Physical Exam:  Constitutional: She is oriented to person, place, and time. She appears well-developed.  HENT: oral mucosa pink and moist, dentition fair Head: Normocephalic.  Eyes: EOM are normal.  Neck: Normal range of motion. Neck supple. No thyromegaly present.  Cardiovascular: Normal rate and regular rhythm. no murmurs Respiratory: Effort normal and breath sounds normal. No respiratory distress.  GI: Bowel sounds are normal. She exhibits no distension. Non tender   Skin: Skin is warm and dry.  Neuro: Alert and oriented. Good insight and awareness, CN exam intact Motor strength is 5/5 bilateral deltoids, biceps, triceps, grip 4/5 in the hip flexors and, 4+/5 knee extensors 4+/5 ankle dorsiflexor and plantarflexors.  Sensation reduced to light touch in both legs below the mid thighs---improving. Senses pain Reduced proprioception in both legs as well in same distribution---improved. Can sense gross pain. No sensory findings in upper extremities and no sensory level in abdomen. Deep tendon reflexes 1+ at patella and absent at ankles  bilateral lower extremities Psych: normal affect, behavior. Musc: no edema, no gross pain with ROM/movement  Assessment/Plan: 1. Functional deficits secondary to severe B12 deficiency polyneuropathy  which require 3+ hours per day of interdisciplinary therapy in a comprehensive inpatient rehab setting. Physiatrist is providing close team supervision and 24 hour management of active medical problems listed below. Physiatrist and rehab team continue to assess barriers to discharge/monitor patient progress toward functional and medical goals.   home today. Goals met. Follow up arranged.  FIM: FIM - Bathing Bathing Steps Patient Completed: Chest, Right Arm, Left Arm, Abdomen, Front perineal area, Buttocks, Right upper leg, Left upper leg, Right lower leg (including foot), Left lower leg (including foot) Bathing: 6: Assistive device (Comment) (RW)  FIM - Upper Body Dressing/Undressing Upper body dressing/undressing steps patient completed: Thread/unthread right sleeve of pullover shirt/dresss, Thread/unthread left sleeve of pullover shirt/dress, Put head through opening of pull over shirt/dress, Pull shirt over trunk Upper body dressing/undressing: 7: Complete Independence: No helper FIM - Lower Body Dressing/Undressing Lower body dressing/undressing steps patient completed: Thread/unthread right pants leg, Thread/unthread left pants leg, Pull pants up/down, Don/Doff right sock, Don/Doff left sock, Don/Doff right shoe, Don/Doff left shoe, Fasten/unfasten right shoe, Fasten/unfasten left shoe Lower body dressing/undressing: 6: Assistive device (Comment) (RW)  FIM - Toileting Toileting steps completed by patient: Adjust clothing prior to toileting, Performs perineal hygiene, Adjust clothing after toileting Toileting Assistive Devices: Grab bar or rail for support Toileting: 6: Assistive device: No helper (RW)  FIM - Radio producer Devices: Insurance account manager Transfers:  6-Assistive device: No helper, 6-From toilet/BSC  FIM -  Bed/Chair Financial planner Devices: Walker, Arm rests Bed/Chair Transfer: 7: Supine > Sit: No assist, 6: Bed > Chair or W/C: No assist, 6: Chair or W/C > Bed: No assist  FIM - Locomotion: Wheelchair Distance: 50 Locomotion: Wheelchair: 0: Activity did not occur (no goal) FIM - Locomotion: Ambulation Locomotion: Ambulation Assistive Devices: Administrator Ambulation/Gait Assistance: 6: Modified independent (Device/Increase time) Locomotion: Ambulation: 6: Travels 150 ft or more independently/takes more than reasonable amount of time  Comprehension Comprehension Mode: Auditory Comprehension: 6-Follows complex conversation/direction: With extra time/assistive device  Expression Expression Mode: Verbal Expression: 7-Expresses complex ideas: With no assist  Social Interaction Social Interaction: 7-Interacts appropriately with others - No medications needed.  Problem Solving Problem Solving: 6-Solves complex problems: With extra time  Memory Memory: 7-Complete Independence: No helper  Medical Problem List and Plan: 1. Functional deficits secondary to severe B12 deficiency with peripheral polyneuropathy.   -  2. DVT Prophylaxis/Anticoagulation: SCDs.   3. Pain Management: Tylenol as needed 4. Mood/depression: Wellbutrin 150 mg daily, Xanax as needed. Provide emotional support\  -appears to be in good spirits at present  -neuropsych eval appreciated  -needs continued outpt followup 5. Neuropsych: This patient is capable of making decisions on her own behalf. 6. Skin/Wound Care: Routine skin checks 7. Fluids/Electrolytes/Nutrition: Strict I and os.    -k+ supplement 8. Probable UTI. Urine cultures negative. 9. History of bronchial asthma. Albuterol nebulizer as needed. 10. Pancytopenia. Labs appear to be trending up.   -counts continue to recover  -recommend oral b complex supp  -rec monthly  injections also (Intrinsic factor ab +) LOS (Days) 11 A FACE TO FACE EVALUATION WAS PERFORMED  SWARTZ,ZACHARY T 12/30/2013 7:22 AM

## 2013-12-31 NOTE — Progress Notes (Signed)
Social Work Patient ID: Jill Summers, female   DOB: [birth year], 28 y.o.   MRN: [id number]   [facility] @ 9:00 am - pt informed and confirms her ability to go to this appointment.  Troi Bechtold, LCSW

## 2014-01-01 ENCOUNTER — Telehealth: Payer: Self-pay | Admitting: Oncology

## 2014-01-01 NOTE — Telephone Encounter (Signed)
S/W PT IN REF TO HOSP. F/U APPT. ON 01/09/14@9 :45

## 2014-01-02 ENCOUNTER — Telehealth: Payer: Self-pay | Admitting: Oncology

## 2014-01-02 ENCOUNTER — Other Ambulatory Visit: Payer: Self-pay | Admitting: Oncology

## 2014-01-02 NOTE — Telephone Encounter (Signed)
cld & left message of time of appt-mailed copy of sch

## 2014-01-06 ENCOUNTER — Encounter: Payer: Self-pay | Admitting: Family Medicine

## 2014-01-06 ENCOUNTER — Ambulatory Visit: Payer: Medicaid Other | Attending: Family Medicine | Admitting: Family Medicine

## 2014-01-06 VITALS — BP 114/74 | HR 104 | Temp 98.3°F | Resp 16 | Ht 63.0 in | Wt 214.0 lb

## 2014-01-06 DIAGNOSIS — R531 Weakness: Secondary | ICD-10-CM | POA: Diagnosis not present

## 2014-01-06 DIAGNOSIS — K5900 Constipation, unspecified: Secondary | ICD-10-CM | POA: Diagnosis not present

## 2014-01-06 DIAGNOSIS — K5901 Slow transit constipation: Secondary | ICD-10-CM

## 2014-01-06 DIAGNOSIS — R42 Dizziness and giddiness: Secondary | ICD-10-CM

## 2014-01-06 DIAGNOSIS — E538 Deficiency of other specified B group vitamins: Secondary | ICD-10-CM | POA: Insufficient documentation

## 2014-01-06 MED ORDER — DOCUSATE SODIUM 100 MG PO CAPS: 100.0000 mg | ORAL_CAPSULE | Freq: Two times a day (BID) | ORAL | Status: DC | PRN

## 2014-01-06 MED ORDER — POLYETHYLENE GLYCOL 3350 17 GM/SCOOP PO POWD: 17.0000 g | Freq: Every day | ORAL | Status: DC | PRN

## 2014-01-06 NOTE — Assessment & Plan Note (Signed)
A: constipation x 5 days P: miralax and colace

## 2014-01-06 NOTE — Assessment & Plan Note (Signed)
Resolved

## 2014-01-06 NOTE — Progress Notes (Signed)
   Subjective:    Patient ID: Jill Summers, female    DOB: 06-Oct-1985, 28 y.o.   MRN: [id number] CC: HFU for [id number]  HPI 28 -year-old female presents to establish and HFU f/u:  1. B12 deficiency: improving gait and L leg numbness. No dizziness or vertigo. Keep f/u with  PT and hematology. Goal is to be off walker soon. No falls. Reports constipation with last BM 5 days ago.   Soc Hx: non smoker Med Hx: B12 deficiency Fam Hx: unknown, patient adopted  Review of Systems As per HPI     Objective:   Physical Exam BP 114/74 mmHg  Pulse 104  Temp(Src) 98.3 F (36.8 C) (Oral)  Resp 16  Ht 5\' 3"  (1.6 m)  Wt 214 lb (97.07 kg)  BMI 37.92 kg/m2  SpO2 100%  LMP 12/13/2013 General appearance: alert, cooperative and no distress Extremities: extremities normal, atraumatic, no cyanosis or edema Neurologic: 5/5 strength, intact sensation. Antalgic gait favoring R side when not using walker.     Assessment & Plan:

## 2014-01-06 NOTE — Patient Instructions (Signed)
[name],  Thank you for coming in today. It was a pleasure meeting you. I look forward to being your primary doctor.   1. For constipation: start miralax and colace for constipation.   2. B12 deficiency: improving. Continue therapy and hematology follow up.   F/u in 4-6 weeks for physical with pap smear.  Dr. Armen PickupFunches

## 2014-01-06 NOTE — Progress Notes (Signed)
Establish Care HFU  unable to walk possible MS Stated had blood transfusion due to Vit B deficient Still needs help walking, no dizziness, no pain, energy level increased

## 2014-01-06 NOTE — Assessment & Plan Note (Signed)
A; with vertigo and leg weakness. Improving P: PT Hematology f/u

## 2014-01-08 ENCOUNTER — Ambulatory Visit: Payer: Medicaid Other

## 2014-01-08 ENCOUNTER — Other Ambulatory Visit: Payer: Medicaid Other

## 2014-01-09 ENCOUNTER — Ambulatory Visit (HOSPITAL_BASED_OUTPATIENT_CLINIC_OR_DEPARTMENT_OTHER): Payer: Medicaid Other | Admitting: Nurse Practitioner

## 2014-01-09 ENCOUNTER — Ambulatory Visit: Payer: Medicaid Other

## 2014-01-09 ENCOUNTER — Other Ambulatory Visit (HOSPITAL_BASED_OUTPATIENT_CLINIC_OR_DEPARTMENT_OTHER): Payer: Medicaid Other

## 2014-01-09 ENCOUNTER — Encounter: Payer: Self-pay | Admitting: Nurse Practitioner

## 2014-01-09 ENCOUNTER — Telehealth: Payer: Self-pay | Admitting: Nurse Practitioner

## 2014-01-09 ENCOUNTER — Ambulatory Visit (HOSPITAL_BASED_OUTPATIENT_CLINIC_OR_DEPARTMENT_OTHER): Payer: Medicaid Other

## 2014-01-09 VITALS — BP 130/82 | HR 82 | Temp 98.1°F | Resp 19 | Ht 63.0 in | Wt 212.4 lb

## 2014-01-09 DIAGNOSIS — E538 Deficiency of other specified B group vitamins: Secondary | ICD-10-CM

## 2014-01-09 DIAGNOSIS — R29898 Other symptoms and signs involving the musculoskeletal system: Secondary | ICD-10-CM

## 2014-01-09 DIAGNOSIS — E539 Vitamin B deficiency, unspecified: Secondary | ICD-10-CM

## 2014-01-09 DIAGNOSIS — D61818 Other pancytopenia: Secondary | ICD-10-CM

## 2014-01-09 DIAGNOSIS — G8220 Paraplegia, unspecified: Secondary | ICD-10-CM

## 2014-01-09 DIAGNOSIS — G63 Polyneuropathy in diseases classified elsewhere: Secondary | ICD-10-CM

## 2014-01-09 LAB — CBC WITH DIFFERENTIAL/PLATELET
BASO%: 0.2 % (ref 0.0–2.0)
Basophils Absolute: 0 10*3/uL (ref 0.0–0.1)
EOS%: 3.4 % (ref 0.0–7.0)
Eosinophils Absolute: 0.2 10*3/uL (ref 0.0–0.5)
HCT: 32 % — ABNORMAL LOW (ref 34.8–46.6)
HGB: 10.5 g/dL — ABNORMAL LOW (ref 11.6–15.9)
LYMPH%: 30.9 % (ref 14.0–49.7)
MCH: 30.5 pg (ref 25.1–34.0)
MCHC: 32.8 g/dL (ref 31.5–36.0)
MCV: 93 fL (ref 79.5–101.0)
MONO#: 0.4 10*3/uL (ref 0.1–0.9)
MONO%: 7.2 % (ref 0.0–14.0)
NEUT#: 3.2 10*3/uL (ref 1.5–6.5)
NEUT%: 58.3 % (ref 38.4–76.8)
Platelets: 179 10*3/uL (ref 145–400)
RBC: 3.44 10*6/uL — ABNORMAL LOW (ref 3.70–5.45)
RDW: 15.3 % — ABNORMAL HIGH (ref 11.2–14.5)
WBC: 5.6 10*3/uL (ref 3.9–10.3)
lymph#: 1.7 10*3/uL (ref 0.9–3.3)

## 2014-01-09 MED ORDER — CYANOCOBALAMIN 1000 MCG/ML IJ SOLN
1000.0000 ug | Freq: Once | INTRAMUSCULAR | Status: AC
  Administered 2014-01-09: 1000 ug via INTRAMUSCULAR

## 2014-01-09 NOTE — Telephone Encounter (Signed)
per pof to add lab to inj-gave pt updated sch

## 2014-01-09 NOTE — Progress Notes (Signed)
Checked in new patient with issues. She has appt card and just got out of hosp. She has medicaid but didn't have card.

## 2014-01-09 NOTE — Progress Notes (Signed)
ID: Jill Summers OB: 1985/05/29  MR#: [id number]  WUJ#:[id number]  PCP: Lora Paula, MD GYN:   SU:  OTHER MD:  CHIEF COMPLAINT: vit B12 anemia CURRENT THERAPY: month vit B12 injections  HISTORY OF PRESENT ILLNESS: Jill Summers is a 28 y.o. right handed female with history of depression and bronchial asthma as well as recent cellulitis of the umbilicus and treated with Bactrim in September 2015 as well as recent MRSA PCR screen +2 months ago as well as previously untreated B-12 deficiency. Patient lives with children independent prior to admission and her mother has been assisting with care of the children recently. Admitted 12/16/2013 with bouts of dizziness over the past 2 weeks as well as lower strandy weakness and numbness. She denied any falls. No change in bowel or bladder. Cranial CT scan negative. MRI of thoracic lumbar and cervical spine showed multiple bilateral thoracic cord lesions in the lateral white matter most compatible with demyelinating disease of question etiology and workup ongoing as per neurology services and felt cord lesions most likely manifestations of B-12 deficiency. Partial visualization of lower cervical cord shows white matter lesions with full cervical spine films pending. MRI of the brain 12/18/2013 showed less than 10 nonspecific subcentimeter supratentorial white matter T2 hyperintensities in a atypical distribution for classic demyelination. Found to have severe pancytopenia with WBC 1.4, ANC 0.4, hemoglobin 5.5, MCV 101.9, platelets 37,000. Hematology service consulted (Dr. Darnelle Catalan) She was transfused. Etiology is secondary to B-12 deficiency and currently maintained on supplementation with plan to follow-up with hematology services mostly for B-12 injections   INTERVAL HISTORY: Jill Summers returns today for follow up of her vitamin B12 deficiency. Since her admission she has been supplemented with intramuscular B12 and received blood transfusions.  She was released from the hospital on 12/31/13. She is slowly regaining her strength, though she still needs a walker to maneuver around.   REVIEW OF SYSTEMS: Jill Summers denies fevers, chills, nausea or vomiting. She has some constipation and is managing this with miralax and colace. Her appetite is healthy and she is staying well hydrated. She has numbness and tingling her upper extremities, but this is improving. She has no numbness to her lower extremities any more at this time. She has some shortness of breath with exertion, but she is able to look after her 71 year old child with few issues. She denies chest pain, cough, or palpitations. She off course experiences fatigue, but this is much improved since starting on the vitamin B12. A detailed review of systems is otherwise negative.   PAST MEDICAL HISTORY: Past Medical History  Diagnosis Date  . Obesity   . Adopted   . Chlamydia   . Bacterial vaginosis   . Urinary tract infection   . MRSA infection   . Asthma     since baby  . Depression     At age 89    PAST SURGICAL HISTORY: Past Surgical History  Procedure Laterality Date  . Cesarean section    . Cesarean section with bilateral tubal ligation      FAMILY HISTORY Family History  Problem Relation Age of Onset  . Adopted: Yes  Mother - hypertension, Father - diabetes  GYNECOLOGIC HISTORY:  Patient's last menstrual period was 12/13/2013.  Menarche age 25, she is GX P2, LMP just ending    SOCIAL HISTORY:  Disabled due to asthma; lives with 38 y/o son Jill Summers. Mother Jill Summers lives next door--she is a retired Arts administrator SW-- with patient's 8  y/o child, Jill Summers.  ADVANCED DIRECTIVES: not in place    HEALTH MAINTENANCE: History  Substance Use Topics  . Smoking status: Never Smoker   . Smokeless tobacco: Never Used  . Alcohol Use: No    Allergies  Allergen Reactions  . Peanut-Containing Drug Products Anaphylaxis, Hives and Swelling    *Throat swelling*  . Shellfish  Allergy Anaphylaxis, Hives and Swelling    Throat swelling    Current Outpatient Prescriptions  Medication Sig Dispense Refill  . ALPRAZolam (XANAX) 0.25 MG tablet Take 1 tablet (0.25 mg total) by mouth 3 (three) times daily as needed for anxiety. 30 tablet 0  . B Complex-C (B-COMPLEX WITH VITAMIN C) tablet Take 1 tablet by mouth daily. 30 tablet 1  . buPROPion (WELLBUTRIN XL) 150 MG 24 hr tablet Take 1 tablet (150 mg total) by mouth daily. 30 tablet 1  . cyanocobalamin (,VITAMIN B-12,) 1000 MCG/ML injection Inject 1 mL (1,000 mcg total) into the muscle every 30 (thirty) days. 1 mL 1  . docusate sodium (COLACE) 100 MG capsule Take 1 capsule (100 mg total) by mouth 2 (two) times daily as needed for mild constipation. 40 capsule 0  . polyethylene glycol powder (GLYCOLAX/MIRALAX) powder Take 17 g by mouth daily as needed. 3350 g 1  . albuterol (PROVENTIL HFA;VENTOLIN HFA) 108 (90 BASE) MCG/ACT inhaler Inhale 2 puffs into the lungs every 6 (six) hours as needed for wheezing or shortness of breath. For asthma    . meclizine (ANTIVERT) 25 MG tablet   0   No current facility-administered medications for this visit.    OBJECTIVE: Filed Vitals:   01/09/14 1033  BP: 130/82  Pulse: 82  Temp: 98.1 F (36.7 C)  Resp: 19     Body mass index is 37.63 kg/(m^2).   ROS  Physical Exam   Skin: warm, dry  HEENT: sclerae anicteric, conjunctivae pink, oropharynx clear. No thrush or mucositis.  Lymph Nodes: No cervical or supraclavicular lymphadenopathy  Lungs: clear to auscultation bilaterally, no rales, wheezes, or rhonci  Heart: regular rate and rhythm  Abdomen: round, soft, non tender, positive bowel sounds  Musculoskeletal: No focal spinal tenderness, weakness to bilateral lower extremities  Neuro: non focal, well oriented, positive affect   ECOG FS:1 - Symptomatic but completely ambulatory  LAB RESULTS:  CBC    Component Value Date/Time   WBC 5.6 01/09/2014 1020   WBC 4.0 12/26/2013  0430   RBC 3.44* 01/09/2014 1020   RBC 2.74* 12/26/2013 0430   RBC 1.65* 12/15/2013 2108   HGB 10.5* 01/09/2014 1020   HGB 9.1* 12/26/2013 0430   HCT 32.0* 01/09/2014 1020   HCT 28.1* 12/26/2013 0430   PLT 179 01/09/2014 1020   PLT 341 12/26/2013 0430   MCV 93.0 01/09/2014 1020   MCV 102.6* 12/26/2013 0430   MCH 30.5 01/09/2014 1020   MCH 33.2 12/26/2013 0430   MCHC 32.8 01/09/2014 1020   MCHC 32.4 12/26/2013 0430   RDW 15.3* 01/09/2014 1020   RDW 21.2* 12/26/2013 0430   LYMPHSABS 1.7 01/09/2014 1020   LYMPHSABS 1.3 12/22/2013 0455   MONOABS 0.4 01/09/2014 1020   MONOABS 0.3 12/22/2013 0455   EOSABS 0.2 01/09/2014 1020   EOSABS 0.0 12/22/2013 0455   BASOSABS 0.0 01/09/2014 1020   BASOSABS 0.0 12/22/2013 0455   CMP     Component Value Date/Time   NA 143 12/24/2013 0718   K 3.7 12/24/2013 0718   CL 106 12/24/2013 0718   CO2 22 12/24/2013 0718  GLUCOSE 101* 12/24/2013 0718   BUN 4* 12/24/2013 0718   CREATININE 0.66 12/24/2013 0718   CALCIUM 8.9 12/24/2013 0718   PROT 6.8 12/22/2013 0455   ALBUMIN 3.4* 12/22/2013 0455   AST 37 12/22/2013 0455   ALT 26 12/22/2013 0455   ALKPHOS 27* 12/22/2013 0455   BILITOT 1.7* 12/22/2013 0455   GFRNONAA >90 12/24/2013 0718   GFRAA >90 12/24/2013 0718        Component Value Date/Time   COLORURINE AMBER* 12/16/2013 0707   APPEARANCEUR HAZY* 12/16/2013 0707   LABSPEC 1.025 12/16/2013 0707   PHURINE 5.0 12/16/2013 0707   GLUCOSEU NEGATIVE 12/16/2013 0707   HGBUR LARGE* 12/16/2013 0707   BILIRUBINUR SMALL* 12/16/2013 0707   KETONESUR NEGATIVE 12/16/2013 0707   PROTEINUR NEGATIVE 12/16/2013 0707   UROBILINOGEN >8.0* 12/16/2013 0707   NITRITE NEGATIVE 12/16/2013 0707   LEUKOCYTESUR SMALL* 12/16/2013 0707    STUDIES: Ct Head Wo Contrast  12/16/2013   CLINICAL DATA:  Dizziness and weakness of both lower extremities.  EXAM: CT HEAD WITHOUT CONTRAST  TECHNIQUE: Contiguous axial images were obtained from the base of the skull  through the vertex without intravenous contrast.  COMPARISON:  None.  FINDINGS: Ventricles and sulci appear symmetrical. No mass effect or midline shift. No abnormal extra-axial fluid collections. Gray-white matter junctions are distinct. Basal cisterns are not effaced. No evidence of acute intracranial hemorrhage. No depressed skull fractures. Visualized paranasal sinuses and mastoid air cells are not opacified.  IMPRESSION: Normal   Electronically Signed   By: Burman NievesWilliam  Stevens M.D.   On: 12/16/2013 01:29   Mr Laqueta JeanBrain W UJWo Contrast  12/18/2013   CLINICAL DATA:  Acute onset lower extremity numbness and weakness, unable to walk. Untreated vitamin B12 deficiency. Anemia.  EXAM: MRI HEAD WITHOUT AND WITH CONTRAST  MRI CERVICAL SPINE WITHOUT AND WITH CONTRAST  TECHNIQUE: Multiplanar, multiecho pulse sequences of the brain and surrounding structures, and cervical spine, to include the craniocervical junction and cervicothoracic junction, were obtained without and with intravenous contrast.  CONTRAST:  20mL MULTIHANCE GADOBENATE DIMEGLUMINE 529 MG/ML IV SOLN  COMPARISON:  CT of the head December 16, 2013 and MRI of the thoracic spine December 17, 2013  FINDINGS: MRI HEAD FINDINGS  Mild motion degraded examination.  No reduced diffusion to suggest acute ischemia nor hyperacute demyelination. No susceptibility artifact to suggest hemorrhage. Less than 10 supratentorial sub cm white matter FLAIR T2 hyperintensities, predominately in a subcortical white matter distribution. Scattered perivascular spaces. No mass lesions, mass effect nor abnormal parenchymal enhancement.  No abnormal extra-axial fluid collections. No abnormal a extra-axial enhancement. No extra-axial masses. Normal major intracranial vascular flow voids seen at the skull base.  Trace paranasal sinus mucosal thickening without air-fluid levels. Ocular globes and orbital contents are unremarkable though not tailored for evaluation. Mastoid air cells appear  well-aerated. Concave superior margin of the pituitary could reflect arachnoid cyst or volume loss.  MRI CERVICAL SPINE FINDINGS  Cervical vertebral bodies and posterior elements are intact and aligned with maintenance of cervical lordosis. Intervertebral discs demonstrate normal morphology and signal characteristics. No abnormal bone marrow signal. No abnormal osseous or intradiscal enhancement.  Cervical spinal cord appears normal in morphology. The axial T2 sequences are moderately motion degraded, which limits sensitivity for cord signal abnormality, however on the sagittal T2 there is linear T2 bright signal within the dorsum of the spinal cord from T2 through T3, and towards the LEFT at T5 through T7. No syrinx. No suspicious cord, leptomeningeal or epidural enhancement.  Included prevertebral and paraspinal soft tissues are unremarkable.  Level by level evaluation demonstrates no significant disc bulge, canal stenosis or neural foraminal narrowing.  IMPRESSION: MRI HEAD: Less than 10 nonspecific subcentimeter supratentorial white matter T2 hyperintensities, in a atypical distribution for classic demyelination. No abnormal parenchymal enhancement or parenchymal brain volume loss.  MRI CERVICAL SPINE: Abnormal nonenhancing signal within spinal cord, limited assessment on the axial sequences due to motion, findings compatible with dorsal column pathology which can be seen with vitamin B12 deficiency, viral entities. No myelomalacia.  No nerve compressive changes.   Electronically Signed   By: Awilda Metroourtnay  Bloomer   On: 12/18/2013 22:42   Mr Cervical Spine W Wo Contrast  12/18/2013   CLINICAL DATA:  Acute onset lower extremity numbness and weakness, unable to walk. Untreated vitamin B12 deficiency. Anemia.  EXAM: MRI HEAD WITHOUT AND WITH CONTRAST  MRI CERVICAL SPINE WITHOUT AND WITH CONTRAST  TECHNIQUE: Multiplanar, multiecho pulse sequences of the brain and surrounding structures, and cervical spine, to include  the craniocervical junction and cervicothoracic junction, were obtained without and with intravenous contrast.  CONTRAST:  20mL MULTIHANCE GADOBENATE DIMEGLUMINE 529 MG/ML IV SOLN  COMPARISON:  CT of the head December 16, 2013 and MRI of the thoracic spine December 17, 2013  FINDINGS: MRI HEAD FINDINGS  Mild motion degraded examination.  No reduced diffusion to suggest acute ischemia nor hyperacute demyelination. No susceptibility artifact to suggest hemorrhage. Less than 10 supratentorial sub cm white matter FLAIR T2 hyperintensities, predominately in a subcortical white matter distribution. Scattered perivascular spaces. No mass lesions, mass effect nor abnormal parenchymal enhancement.  No abnormal extra-axial fluid collections. No abnormal a extra-axial enhancement. No extra-axial masses. Normal major intracranial vascular flow voids seen at the skull base.  Trace paranasal sinus mucosal thickening without air-fluid levels. Ocular globes and orbital contents are unremarkable though not tailored for evaluation. Mastoid air cells appear well-aerated. Concave superior margin of the pituitary could reflect arachnoid cyst or volume loss.  MRI CERVICAL SPINE FINDINGS  Cervical vertebral bodies and posterior elements are intact and aligned with maintenance of cervical lordosis. Intervertebral discs demonstrate normal morphology and signal characteristics. No abnormal bone marrow signal. No abnormal osseous or intradiscal enhancement.  Cervical spinal cord appears normal in morphology. The axial T2 sequences are moderately motion degraded, which limits sensitivity for cord signal abnormality, however on the sagittal T2 there is linear T2 bright signal within the dorsum of the spinal cord from T2 through T3, and towards the LEFT at T5 through T7. No syrinx. No suspicious cord, leptomeningeal or epidural enhancement. Included prevertebral and paraspinal soft tissues are unremarkable.  Level by level evaluation  demonstrates no significant disc bulge, canal stenosis or neural foraminal narrowing.  IMPRESSION: MRI HEAD: Less than 10 nonspecific subcentimeter supratentorial white matter T2 hyperintensities, in a atypical distribution for classic demyelination. No abnormal parenchymal enhancement or parenchymal brain volume loss.  MRI CERVICAL SPINE: Abnormal nonenhancing signal within spinal cord, limited assessment on the axial sequences due to motion, findings compatible with dorsal column pathology which can be seen with vitamin B12 deficiency, viral entities. No myelomalacia.  No nerve compressive changes.   Electronically Signed   By: Awilda Metroourtnay  Bloomer   On: 12/18/2013 22:42   Mr Thoracic Spine W Wo Contrast  12/17/2013   CLINICAL DATA:  Bilateral leg weakness. Mid and low back pain. Initial encounter.  EXAM: MRI THORACIC AND LUMBAR SPINE WITHOUT AND WITH CONTRAST  TECHNIQUE: Multiplanar and multiecho pulse sequences of the thoracic and  lumbar spine were obtained without and with intravenous contrast.  CONTRAST:  20mL MULTIHANCE GADOBENATE DIMEGLUMINE 529 MG/ML IV SOLN  COMPARISON:  None.  FINDINGS: MR THORACIC SPINE FINDINGS  Segmentation: Counting was performed from the craniocervical junction. Segmentation is anatomic with 7 cervical, 12 thoracic and 5 lumbar type vertebral bodies.  Alignment: Anatomic.  Vertebrae: Normal.  Active red marrow throughout the spine.  Cord: There are small peripheral cord lesions in the lateral aspect of the cord bilaterally throughout the thoracic spine. The show increased T2 signal and are only well appreciated on the axial images due to partial volume averaging on the sagittal images. When correlating axial and sagittal images, they be can be seen on the inversion recovery images. Most prominent lesion is at the T5-T6 level on the LEFT. None of these lesions show post gadolinium enhancement to suggest an active demyelinating disease these are confined to white matter tracks without  involvement of the gray matter. These are also visualized in the lower cervical cord at C7-T1.  Paraspinal tissues: Normal.  Disc levels:  Normal thoracic intervertebral discs.  No stenosis.  MR LUMBAR SPINE FINDINGS  Alignment:  Anatomic.  Vertebrae:  Normal.  Conus medullaris: Normal at L1-L2.  Paraspinal tissues: Normal.  Disc levels:  Normal.  No disc herniations or degenerative disease.  After contrast administration, there is no abnormal enhancement in the lumbar spine.  IMPRESSION: 1. Multiple bilateral thoracic cord lesions in the lateral white matter, most compatible with prior demyelinating disease. No active demyelinating disease in the thoracic spine. This could be seen with multiple sclerosis, NMO, vasculitis, neurosarcoidosis, or ADEM among other causes. In this age group and with this pattern, multiple sclerosis is most likely. 2. Partial visualization of the lower cervical cord shows white matter lesions an cervical spine MRI with and without contrast is recommended to complete the assessment. An MRI of the brain should also be considered to assess for involvement. 3. Normal MRI of the lumbar spine.   Electronically Signed   By: Andreas Newport M.D.   On: 12/17/2013 16:00   Mr Lumbar Spine W Wo Contrast  12/17/2013   CLINICAL DATA:  Bilateral leg weakness. Mid and low back pain. Initial encounter.  EXAM: MRI THORACIC AND LUMBAR SPINE WITHOUT AND WITH CONTRAST  TECHNIQUE: Multiplanar and multiecho pulse sequences of the thoracic and lumbar spine were obtained without and with intravenous contrast.  CONTRAST:  20mL MULTIHANCE GADOBENATE DIMEGLUMINE 529 MG/ML IV SOLN  COMPARISON:  None.  FINDINGS: MR THORACIC SPINE FINDINGS  Segmentation: Counting was performed from the craniocervical junction. Segmentation is anatomic with 7 cervical, 12 thoracic and 5 lumbar type vertebral bodies.  Alignment: Anatomic.  Vertebrae: Normal.  Active red marrow throughout the spine.  Cord: There are small peripheral  cord lesions in the lateral aspect of the cord bilaterally throughout the thoracic spine. The show increased T2 signal and are only well appreciated on the axial images due to partial volume averaging on the sagittal images. When correlating axial and sagittal images, they be can be seen on the inversion recovery images. Most prominent lesion is at the T5-T6 level on the LEFT. None of these lesions show post gadolinium enhancement to suggest an active demyelinating disease these are confined to white matter tracks without involvement of the gray matter. These are also visualized in the lower cervical cord at C7-T1.  Paraspinal tissues: Normal.  Disc levels:  Normal thoracic intervertebral discs.  No stenosis.  MR LUMBAR SPINE FINDINGS  Alignment:  Anatomic.  Vertebrae:  Normal.  Conus medullaris: Normal at L1-L2.  Paraspinal tissues: Normal.  Disc levels:  Normal.  No disc herniations or degenerative disease.  After contrast administration, there is no abnormal enhancement in the lumbar spine.  IMPRESSION: 1. Multiple bilateral thoracic cord lesions in the lateral white matter, most compatible with prior demyelinating disease. No active demyelinating disease in the thoracic spine. This could be seen with multiple sclerosis, NMO, vasculitis, neurosarcoidosis, or ADEM among other causes. In this age group and with this pattern, multiple sclerosis is most likely. 2. Partial visualization of the lower cervical cord shows white matter lesions an cervical spine MRI with and without contrast is recommended to complete the assessment. An MRI of the brain should also be considered to assess for involvement. 3. Normal MRI of the lumbar spine.   Electronically Signed   By: Andreas Newport M.D.   On: 12/17/2013 16:00    ASSESSMENT: 28 y.o. Jill Summers woman presenting 11/15/2013 with inability to walk, fever and pancytopenia, found to be severely B-12 deficient  (1) B-12 deficiency: will need lifelong replacement; as  patient does not have PCP this can be done through our office (a) will receive montlhy  (2) subacute combined degeneration - improving   PLAN:  Jill Summers is doing well today. The labs were reviewed in detail and were entirely stable. Her hgb is up to 10.5. Jill Summers discussed her monthly injections. At this time she would prefer that we administer them. She will give some more thought to performing these herself in the future.   Jill Summers will have a CBC drawn with her injections every month, and a vitamin B12 level and TSH drawn every 6 months. Her next visit to this office will be next December. She understands and agrees with this plan. She has been encouraged to call with any issues that might arise before her next visit here.  Jill Byes, NP  01/12/2014 11:37 AM

## 2014-01-09 NOTE — Patient Instructions (Signed)

## 2014-01-14 ENCOUNTER — Encounter: Payer: Self-pay | Admitting: Nurse Practitioner

## 2014-02-06 ENCOUNTER — Other Ambulatory Visit (HOSPITAL_BASED_OUTPATIENT_CLINIC_OR_DEPARTMENT_OTHER): Payer: Medicaid Other

## 2014-02-06 ENCOUNTER — Ambulatory Visit (HOSPITAL_BASED_OUTPATIENT_CLINIC_OR_DEPARTMENT_OTHER): Payer: Medicaid Other

## 2014-02-06 DIAGNOSIS — E539 Vitamin B deficiency, unspecified: Secondary | ICD-10-CM

## 2014-02-06 DIAGNOSIS — D61818 Other pancytopenia: Secondary | ICD-10-CM

## 2014-02-06 DIAGNOSIS — E538 Deficiency of other specified B group vitamins: Secondary | ICD-10-CM

## 2014-02-06 DIAGNOSIS — G8220 Paraplegia, unspecified: Secondary | ICD-10-CM

## 2014-02-06 DIAGNOSIS — G63 Polyneuropathy in diseases classified elsewhere: Secondary | ICD-10-CM

## 2014-02-06 DIAGNOSIS — R29898 Other symptoms and signs involving the musculoskeletal system: Secondary | ICD-10-CM

## 2014-02-06 LAB — CBC WITH DIFFERENTIAL/PLATELET
BASO%: 0.3 % (ref 0.0–2.0)
Basophils Absolute: 0 10*3/uL (ref 0.0–0.1)
EOS%: 4.3 % (ref 0.0–7.0)
Eosinophils Absolute: 0.2 10*3/uL (ref 0.0–0.5)
HCT: 38.7 % (ref 34.8–46.6)
HGB: 12.3 g/dL (ref 11.6–15.9)
LYMPH%: 34.2 % (ref 14.0–49.7)
MCH: 28 pg (ref 25.1–34.0)
MCHC: 31.8 g/dL (ref 31.5–36.0)
MCV: 87.8 fL (ref 79.5–101.0)
MONO#: 0.4 10*3/uL (ref 0.1–0.9)
MONO%: 7.5 % (ref 0.0–14.0)
NEUT#: 2.9 10*3/uL (ref 1.5–6.5)
NEUT%: 53.7 % (ref 38.4–76.8)
Platelets: 228 10*3/uL (ref 145–400)
RBC: 4.41 10*6/uL (ref 3.70–5.45)
RDW: 14.5 % (ref 11.2–14.5)
WBC: 5.4 10*3/uL (ref 3.9–10.3)
lymph#: 1.8 10*3/uL (ref 0.9–3.3)

## 2014-02-06 LAB — TSH CHCC: TSH: 1.29 m(IU)/L (ref 0.308–3.960)

## 2014-02-06 MED ORDER — CYANOCOBALAMIN 1000 MCG/ML IJ SOLN
1000.0000 ug | Freq: Once | INTRAMUSCULAR | Status: AC
  Administered 2014-02-06: 1000 ug via INTRAMUSCULAR

## 2014-02-06 NOTE — Patient Instructions (Signed)

## 2014-02-07 LAB — VITAMIN B12: Vitamin B-12: 263 pg/mL (ref 211–911)

## 2014-02-18 ENCOUNTER — Inpatient Hospital Stay: Payer: Medicaid Other | Admitting: Physical Medicine & Rehabilitation

## 2014-03-13 ENCOUNTER — Ambulatory Visit (HOSPITAL_BASED_OUTPATIENT_CLINIC_OR_DEPARTMENT_OTHER): Payer: Medicaid Other

## 2014-03-13 ENCOUNTER — Other Ambulatory Visit (HOSPITAL_BASED_OUTPATIENT_CLINIC_OR_DEPARTMENT_OTHER): Payer: Medicaid Other

## 2014-03-13 DIAGNOSIS — G63 Polyneuropathy in diseases classified elsewhere: Secondary | ICD-10-CM

## 2014-03-13 DIAGNOSIS — D61818 Other pancytopenia: Secondary | ICD-10-CM

## 2014-03-13 DIAGNOSIS — E538 Deficiency of other specified B group vitamins: Secondary | ICD-10-CM

## 2014-03-13 DIAGNOSIS — G8220 Paraplegia, unspecified: Secondary | ICD-10-CM

## 2014-03-13 DIAGNOSIS — R29898 Other symptoms and signs involving the musculoskeletal system: Secondary | ICD-10-CM

## 2014-03-13 DIAGNOSIS — E539 Vitamin B deficiency, unspecified: Secondary | ICD-10-CM

## 2014-03-13 LAB — CBC WITH DIFFERENTIAL/PLATELET
BASO%: 0.1 % (ref 0.0–2.0)
Basophils Absolute: 0 10*3/uL (ref 0.0–0.1)
EOS%: 1.3 % (ref 0.0–7.0)
Eosinophils Absolute: 0.1 10*3/uL (ref 0.0–0.5)
HCT: 38.9 % (ref 34.8–46.6)
HGB: 12 g/dL (ref 11.6–15.9)
LYMPH%: 32.6 % (ref 14.0–49.7)
MCH: 26.5 pg (ref 25.1–34.0)
MCHC: 30.9 g/dL — ABNORMAL LOW (ref 31.5–36.0)
MCV: 85.8 fL (ref 79.5–101.0)
MONO#: 0.3 10*3/uL (ref 0.1–0.9)
MONO%: 4.5 % (ref 0.0–14.0)
NEUT#: 3.8 10*3/uL (ref 1.5–6.5)
NEUT%: 61.5 % (ref 38.4–76.8)
Platelets: 296 10*3/uL (ref 145–400)
RBC: 4.53 10*6/uL (ref 3.70–5.45)
RDW: 15.6 % — ABNORMAL HIGH (ref 11.2–14.5)
WBC: 6.1 10*3/uL (ref 3.9–10.3)
lymph#: 2 10*3/uL (ref 0.9–3.3)

## 2014-03-13 MED ORDER — CYANOCOBALAMIN 1000 MCG/ML IJ SOLN
1000.0000 ug | Freq: Once | INTRAMUSCULAR | Status: AC
  Administered 2014-03-13: 1000 ug via INTRAMUSCULAR

## 2014-04-10 ENCOUNTER — Ambulatory Visit (HOSPITAL_BASED_OUTPATIENT_CLINIC_OR_DEPARTMENT_OTHER): Payer: Medicaid Other

## 2014-04-10 ENCOUNTER — Other Ambulatory Visit (HOSPITAL_BASED_OUTPATIENT_CLINIC_OR_DEPARTMENT_OTHER): Payer: Medicaid Other

## 2014-04-10 DIAGNOSIS — R29898 Other symptoms and signs involving the musculoskeletal system: Secondary | ICD-10-CM

## 2014-04-10 DIAGNOSIS — D61818 Other pancytopenia: Secondary | ICD-10-CM

## 2014-04-10 DIAGNOSIS — E538 Deficiency of other specified B group vitamins: Secondary | ICD-10-CM

## 2014-04-10 DIAGNOSIS — G8220 Paraplegia, unspecified: Secondary | ICD-10-CM

## 2014-04-10 DIAGNOSIS — G63 Polyneuropathy in diseases classified elsewhere: Secondary | ICD-10-CM

## 2014-04-10 DIAGNOSIS — E539 Vitamin B deficiency, unspecified: Secondary | ICD-10-CM

## 2014-04-10 LAB — CBC WITH DIFFERENTIAL/PLATELET
BASO%: 0.2 % (ref 0.0–2.0)
Basophils Absolute: 0 10*3/uL (ref 0.0–0.1)
EOS%: 1 % (ref 0.0–7.0)
Eosinophils Absolute: 0.1 10*3/uL (ref 0.0–0.5)
HCT: 37.7 % (ref 34.8–46.6)
HGB: 12.3 g/dL (ref 11.6–15.9)
LYMPH%: 31 % (ref 14.0–49.7)
MCH: 27.6 pg (ref 25.1–34.0)
MCHC: 32.6 g/dL (ref 31.5–36.0)
MCV: 84.5 fL (ref 79.5–101.0)
MONO#: 0.4 10*3/uL (ref 0.1–0.9)
MONO%: 5.8 % (ref 0.0–14.0)
NEUT#: 3.9 10*3/uL (ref 1.5–6.5)
NEUT%: 62 % (ref 38.4–76.8)
Platelets: 233 10*3/uL (ref 145–400)
RBC: 4.46 10*6/uL (ref 3.70–5.45)
RDW: 15.6 % — ABNORMAL HIGH (ref 11.2–14.5)
WBC: 6.2 10*3/uL (ref 3.9–10.3)
lymph#: 1.9 10*3/uL (ref 0.9–3.3)
nRBC: 0 % (ref 0–0)

## 2014-04-10 MED ORDER — CYANOCOBALAMIN 1000 MCG/ML IJ SOLN
1000.0000 ug | Freq: Once | INTRAMUSCULAR | Status: AC
  Administered 2014-04-10: 1000 ug via INTRAMUSCULAR

## 2014-05-08 ENCOUNTER — Ambulatory Visit (HOSPITAL_BASED_OUTPATIENT_CLINIC_OR_DEPARTMENT_OTHER): Payer: Medicaid Other

## 2014-05-08 ENCOUNTER — Other Ambulatory Visit (HOSPITAL_BASED_OUTPATIENT_CLINIC_OR_DEPARTMENT_OTHER): Payer: Medicaid Other

## 2014-05-08 DIAGNOSIS — D61818 Other pancytopenia: Secondary | ICD-10-CM

## 2014-05-08 DIAGNOSIS — R29898 Other symptoms and signs involving the musculoskeletal system: Secondary | ICD-10-CM

## 2014-05-08 DIAGNOSIS — G63 Polyneuropathy in diseases classified elsewhere: Secondary | ICD-10-CM

## 2014-05-08 DIAGNOSIS — E538 Deficiency of other specified B group vitamins: Secondary | ICD-10-CM

## 2014-05-08 DIAGNOSIS — E539 Vitamin B deficiency, unspecified: Secondary | ICD-10-CM

## 2014-05-08 DIAGNOSIS — G8220 Paraplegia, unspecified: Secondary | ICD-10-CM

## 2014-05-08 LAB — CBC WITH DIFFERENTIAL/PLATELET
BASO%: 0.2 % (ref 0.0–2.0)
Basophils Absolute: 0 10*3/uL (ref 0.0–0.1)
EOS%: 2.8 % (ref 0.0–7.0)
Eosinophils Absolute: 0.2 10*3/uL (ref 0.0–0.5)
HCT: 38.2 % (ref 34.8–46.6)
HGB: 12.6 g/dL (ref 11.6–15.9)
LYMPH%: 42.4 % (ref 14.0–49.7)
MCH: 28.1 pg (ref 25.1–34.0)
MCHC: 33 g/dL (ref 31.5–36.0)
MCV: 85.1 fL (ref 79.5–101.0)
MONO#: 0.3 10*3/uL (ref 0.1–0.9)
MONO%: 4.3 % (ref 0.0–14.0)
NEUT#: 3 10*3/uL (ref 1.5–6.5)
NEUT%: 50.3 % (ref 38.4–76.8)
Platelets: 244 10*3/uL (ref 145–400)
RBC: 4.49 10*6/uL (ref 3.70–5.45)
RDW: 15.1 % — ABNORMAL HIGH (ref 11.2–14.5)
WBC: 6 10*3/uL (ref 3.9–10.3)
lymph#: 2.6 10*3/uL (ref 0.9–3.3)
nRBC: 0 % (ref 0–0)

## 2014-05-08 MED ORDER — CYANOCOBALAMIN 1000 MCG/ML IJ SOLN
1000.0000 ug | Freq: Once | INTRAMUSCULAR | Status: AC
  Administered 2014-05-08: 1000 ug via INTRAMUSCULAR

## 2014-06-12 ENCOUNTER — Other Ambulatory Visit (HOSPITAL_BASED_OUTPATIENT_CLINIC_OR_DEPARTMENT_OTHER): Payer: Medicaid Other

## 2014-06-12 ENCOUNTER — Ambulatory Visit (HOSPITAL_BASED_OUTPATIENT_CLINIC_OR_DEPARTMENT_OTHER): Payer: Medicaid Other

## 2014-06-12 VITALS — BP 120/81 | HR 77 | Temp 97.9°F

## 2014-06-12 DIAGNOSIS — D61818 Other pancytopenia: Secondary | ICD-10-CM | POA: Diagnosis not present

## 2014-06-12 DIAGNOSIS — G8220 Paraplegia, unspecified: Secondary | ICD-10-CM

## 2014-06-12 DIAGNOSIS — G63 Polyneuropathy in diseases classified elsewhere: Secondary | ICD-10-CM

## 2014-06-12 DIAGNOSIS — E538 Deficiency of other specified B group vitamins: Secondary | ICD-10-CM

## 2014-06-12 DIAGNOSIS — R29898 Other symptoms and signs involving the musculoskeletal system: Secondary | ICD-10-CM

## 2014-06-12 DIAGNOSIS — E539 Vitamin B deficiency, unspecified: Secondary | ICD-10-CM

## 2014-06-12 LAB — CBC WITH DIFFERENTIAL/PLATELET
BASO%: 0.2 % (ref 0.0–2.0)
Basophils Absolute: 0 10*3/uL (ref 0.0–0.1)
EOS%: 1.3 % (ref 0.0–7.0)
Eosinophils Absolute: 0.1 10*3/uL (ref 0.0–0.5)
HCT: 39.6 % (ref 34.8–46.6)
HGB: 13.2 g/dL (ref 11.6–15.9)
LYMPH%: 38.2 % (ref 14.0–49.7)
MCH: 28.4 pg (ref 25.1–34.0)
MCHC: 33.3 g/dL (ref 31.5–36.0)
MCV: 85.2 fL (ref 79.5–101.0)
MONO#: 0.3 10*3/uL (ref 0.1–0.9)
MONO%: 5 % (ref 0.0–14.0)
NEUT#: 3.3 10*3/uL (ref 1.5–6.5)
NEUT%: 55.3 % (ref 38.4–76.8)
Platelets: 241 10*3/uL (ref 145–400)
RBC: 4.65 10*6/uL (ref 3.70–5.45)
RDW: 14.6 % — ABNORMAL HIGH (ref 11.2–14.5)
WBC: 6 10*3/uL (ref 3.9–10.3)
lymph#: 2.3 10*3/uL (ref 0.9–3.3)
nRBC: 0 % (ref 0–0)

## 2014-06-12 MED ORDER — CYANOCOBALAMIN 1000 MCG/ML IJ SOLN
1000.0000 ug | Freq: Once | INTRAMUSCULAR | Status: AC
  Administered 2014-06-12: 1000 ug via INTRAMUSCULAR

## 2014-07-10 ENCOUNTER — Telehealth: Payer: Self-pay | Admitting: *Deleted

## 2014-07-10 ENCOUNTER — Ambulatory Visit: Payer: Medicaid Other

## 2014-07-10 ENCOUNTER — Other Ambulatory Visit: Payer: Medicaid Other

## 2014-07-10 NOTE — Telephone Encounter (Signed)
Called Jill Summers about missed appointment.  She forgot today.  Reschedule for next week.  Patient aware of date and time.

## 2014-07-13 ENCOUNTER — Ambulatory Visit (HOSPITAL_BASED_OUTPATIENT_CLINIC_OR_DEPARTMENT_OTHER): Payer: Medicaid Other

## 2014-07-13 ENCOUNTER — Other Ambulatory Visit (HOSPITAL_BASED_OUTPATIENT_CLINIC_OR_DEPARTMENT_OTHER): Payer: Medicaid Other

## 2014-07-13 VITALS — BP 124/85 | HR 72 | Temp 98.1°F

## 2014-07-13 DIAGNOSIS — E539 Vitamin B deficiency, unspecified: Secondary | ICD-10-CM

## 2014-07-13 DIAGNOSIS — E538 Deficiency of other specified B group vitamins: Secondary | ICD-10-CM | POA: Diagnosis not present

## 2014-07-13 DIAGNOSIS — R29898 Other symptoms and signs involving the musculoskeletal system: Secondary | ICD-10-CM

## 2014-07-13 DIAGNOSIS — D61818 Other pancytopenia: Secondary | ICD-10-CM

## 2014-07-13 DIAGNOSIS — G8220 Paraplegia, unspecified: Secondary | ICD-10-CM

## 2014-07-13 DIAGNOSIS — G63 Polyneuropathy in diseases classified elsewhere: Secondary | ICD-10-CM

## 2014-07-13 LAB — CBC WITH DIFFERENTIAL/PLATELET
BASO%: 0.2 % (ref 0.0–2.0)
Basophils Absolute: 0 10*3/uL (ref 0.0–0.1)
EOS%: 4.7 % (ref 0.0–7.0)
Eosinophils Absolute: 0.3 10*3/uL (ref 0.0–0.5)
HCT: 36.7 % (ref 34.8–46.6)
HGB: 12 g/dL (ref 11.6–15.9)
LYMPH%: 35.5 % (ref 14.0–49.7)
MCH: 27.8 pg (ref 25.1–34.0)
MCHC: 32.7 g/dL (ref 31.5–36.0)
MCV: 85 fL (ref 79.5–101.0)
MONO#: 0.4 10*3/uL (ref 0.1–0.9)
MONO%: 6.8 % (ref 0.0–14.0)
NEUT#: 3.2 10*3/uL (ref 1.5–6.5)
NEUT%: 52.8 % (ref 38.4–76.8)
Platelets: 235 10*3/uL (ref 145–400)
RBC: 4.32 10*6/uL (ref 3.70–5.45)
RDW: 14.1 % (ref 11.2–14.5)
WBC: 6.1 10*3/uL (ref 3.9–10.3)
lymph#: 2.2 10*3/uL (ref 0.9–3.3)
nRBC: 0 % (ref 0–0)

## 2014-07-13 MED ORDER — CYANOCOBALAMIN 1000 MCG/ML IJ SOLN
1000.0000 ug | Freq: Once | INTRAMUSCULAR | Status: AC
  Administered 2014-07-13: 1000 ug via INTRAMUSCULAR

## 2014-08-07 ENCOUNTER — Ambulatory Visit: Payer: Medicaid Other

## 2014-08-07 ENCOUNTER — Other Ambulatory Visit: Payer: Medicaid Other

## 2014-09-11 ENCOUNTER — Ambulatory Visit (HOSPITAL_BASED_OUTPATIENT_CLINIC_OR_DEPARTMENT_OTHER): Payer: Medicaid Other

## 2014-09-11 ENCOUNTER — Other Ambulatory Visit (HOSPITAL_BASED_OUTPATIENT_CLINIC_OR_DEPARTMENT_OTHER): Payer: Medicaid Other

## 2014-09-11 VITALS — BP 123/74 | HR 76 | Temp 98.3°F

## 2014-09-11 DIAGNOSIS — E539 Vitamin B deficiency, unspecified: Secondary | ICD-10-CM

## 2014-09-11 DIAGNOSIS — G63 Polyneuropathy in diseases classified elsewhere: Secondary | ICD-10-CM

## 2014-09-11 DIAGNOSIS — E538 Deficiency of other specified B group vitamins: Secondary | ICD-10-CM

## 2014-09-11 DIAGNOSIS — G8220 Paraplegia, unspecified: Secondary | ICD-10-CM

## 2014-09-11 DIAGNOSIS — D61818 Other pancytopenia: Secondary | ICD-10-CM

## 2014-09-11 DIAGNOSIS — F329 Major depressive disorder, single episode, unspecified: Secondary | ICD-10-CM | POA: Diagnosis not present

## 2014-09-11 DIAGNOSIS — R29898 Other symptoms and signs involving the musculoskeletal system: Secondary | ICD-10-CM

## 2014-09-11 LAB — CBC WITH DIFFERENTIAL/PLATELET
BASO%: 0.3 % (ref 0.0–2.0)
Basophils Absolute: 0 10*3/uL (ref 0.0–0.1)
EOS%: 1.2 % (ref 0.0–7.0)
Eosinophils Absolute: 0.1 10*3/uL (ref 0.0–0.5)
HCT: 34.7 % — ABNORMAL LOW (ref 34.8–46.6)
HGB: 11.4 g/dL — ABNORMAL LOW (ref 11.6–15.9)
LYMPH%: 28.6 % (ref 14.0–49.7)
MCH: 26.9 pg (ref 25.1–34.0)
MCHC: 32.7 g/dL (ref 31.5–36.0)
MCV: 82.3 fL (ref 79.5–101.0)
MONO#: 0.3 10*3/uL (ref 0.1–0.9)
MONO%: 4.7 % (ref 0.0–14.0)
NEUT#: 4.5 10*3/uL (ref 1.5–6.5)
NEUT%: 65.2 % (ref 38.4–76.8)
Platelets: 264 10*3/uL (ref 145–400)
RBC: 4.22 10*6/uL (ref 3.70–5.45)
RDW: 15 % — ABNORMAL HIGH (ref 11.2–14.5)
WBC: 6.9 10*3/uL (ref 3.9–10.3)
lymph#: 2 10*3/uL (ref 0.9–3.3)

## 2014-09-11 LAB — TSH CHCC: TSH: 1.76 m(IU)/L (ref 0.308–3.960)

## 2014-09-11 MED ORDER — CYANOCOBALAMIN 1000 MCG/ML IJ SOLN
1000.0000 ug | Freq: Once | INTRAMUSCULAR | Status: AC
  Administered 2014-09-11: 1000 ug via INTRAMUSCULAR

## 2014-09-14 LAB — VITAMIN B12: Vitamin B-12: 189 pg/mL — ABNORMAL LOW (ref 211–911)

## 2014-10-09 ENCOUNTER — Other Ambulatory Visit (HOSPITAL_BASED_OUTPATIENT_CLINIC_OR_DEPARTMENT_OTHER): Payer: Medicaid Other

## 2014-10-09 ENCOUNTER — Ambulatory Visit (HOSPITAL_BASED_OUTPATIENT_CLINIC_OR_DEPARTMENT_OTHER): Payer: Medicaid Other

## 2014-10-09 VITALS — BP 120/66 | HR 72 | Temp 98.7°F

## 2014-10-09 DIAGNOSIS — E538 Deficiency of other specified B group vitamins: Secondary | ICD-10-CM

## 2014-10-09 DIAGNOSIS — E539 Vitamin B deficiency, unspecified: Secondary | ICD-10-CM

## 2014-10-09 DIAGNOSIS — G8220 Paraplegia, unspecified: Secondary | ICD-10-CM

## 2014-10-09 DIAGNOSIS — D61818 Other pancytopenia: Secondary | ICD-10-CM

## 2014-10-09 DIAGNOSIS — R29898 Other symptoms and signs involving the musculoskeletal system: Secondary | ICD-10-CM

## 2014-10-09 DIAGNOSIS — G63 Polyneuropathy in diseases classified elsewhere: Secondary | ICD-10-CM

## 2014-10-09 LAB — CBC WITH DIFFERENTIAL/PLATELET
BASO%: 0.2 % (ref 0.0–2.0)
Basophils Absolute: 0 10*3/uL (ref 0.0–0.1)
EOS%: 1.4 % (ref 0.0–7.0)
Eosinophils Absolute: 0.1 10*3/uL (ref 0.0–0.5)
HCT: 36.4 % (ref 34.8–46.6)
HGB: 11.8 g/dL (ref 11.6–15.9)
LYMPH%: 40.3 % (ref 14.0–49.7)
MCH: 26.6 pg (ref 25.1–34.0)
MCHC: 32.4 g/dL (ref 31.5–36.0)
MCV: 82.2 fL (ref 79.5–101.0)
MONO#: 0.3 10*3/uL (ref 0.1–0.9)
MONO%: 5.8 % (ref 0.0–14.0)
NEUT#: 2.9 10*3/uL (ref 1.5–6.5)
NEUT%: 52.3 % (ref 38.4–76.8)
Platelets: 234 10*3/uL (ref 145–400)
RBC: 4.43 10*6/uL (ref 3.70–5.45)
RDW: 14.6 % — ABNORMAL HIGH (ref 11.2–14.5)
WBC: 5.5 10*3/uL (ref 3.9–10.3)
lymph#: 2.2 10*3/uL (ref 0.9–3.3)
nRBC: 0 % (ref 0–0)

## 2014-10-09 MED ORDER — CYANOCOBALAMIN 1000 MCG/ML IJ SOLN
1000.0000 ug | Freq: Once | INTRAMUSCULAR | Status: AC
  Administered 2014-10-09: 1000 ug via INTRAMUSCULAR

## 2014-11-13 ENCOUNTER — Ambulatory Visit (HOSPITAL_BASED_OUTPATIENT_CLINIC_OR_DEPARTMENT_OTHER): Payer: Medicaid Other

## 2014-11-13 ENCOUNTER — Other Ambulatory Visit (HOSPITAL_BASED_OUTPATIENT_CLINIC_OR_DEPARTMENT_OTHER): Payer: Medicaid Other

## 2014-11-13 VITALS — BP 134/82 | HR 67 | Temp 98.5°F | Resp 18

## 2014-11-13 DIAGNOSIS — E539 Vitamin B deficiency, unspecified: Secondary | ICD-10-CM

## 2014-11-13 DIAGNOSIS — R29898 Other symptoms and signs involving the musculoskeletal system: Secondary | ICD-10-CM

## 2014-11-13 DIAGNOSIS — E538 Deficiency of other specified B group vitamins: Secondary | ICD-10-CM | POA: Diagnosis present

## 2014-11-13 DIAGNOSIS — G63 Polyneuropathy in diseases classified elsewhere: Secondary | ICD-10-CM

## 2014-11-13 DIAGNOSIS — D61818 Other pancytopenia: Secondary | ICD-10-CM

## 2014-11-13 DIAGNOSIS — G8220 Paraplegia, unspecified: Secondary | ICD-10-CM

## 2014-11-13 LAB — CBC WITH DIFFERENTIAL/PLATELET
BASO%: 0 % (ref 0.0–2.0)
Basophils Absolute: 0 10*3/uL (ref 0.0–0.1)
EOS%: 3.2 % (ref 0.0–7.0)
Eosinophils Absolute: 0.2 10*3/uL (ref 0.0–0.5)
HCT: 34.5 % — ABNORMAL LOW (ref 34.8–46.6)
HGB: 11.1 g/dL — ABNORMAL LOW (ref 11.6–15.9)
LYMPH%: 41.2 % (ref 14.0–49.7)
MCH: 26.1 pg (ref 25.1–34.0)
MCHC: 32.2 g/dL (ref 31.5–36.0)
MCV: 81 fL (ref 79.5–101.0)
MONO#: 0.2 10*3/uL (ref 0.1–0.9)
MONO%: 4.1 % (ref 0.0–14.0)
NEUT#: 2.4 10*3/uL (ref 1.5–6.5)
NEUT%: 51.5 % (ref 38.4–76.8)
Platelets: 254 10*3/uL (ref 145–400)
RBC: 4.26 10*6/uL (ref 3.70–5.45)
RDW: 14.8 % — ABNORMAL HIGH (ref 11.2–14.5)
WBC: 4.7 10*3/uL (ref 3.9–10.3)
lymph#: 1.9 10*3/uL (ref 0.9–3.3)
nRBC: 0 % (ref 0–0)

## 2014-11-13 MED ORDER — CYANOCOBALAMIN 1000 MCG/ML IJ SOLN
1000.0000 ug | Freq: Once | INTRAMUSCULAR | Status: AC
  Administered 2014-11-13: 1000 ug via INTRAMUSCULAR

## 2014-11-13 NOTE — Patient Instructions (Signed)

## 2014-12-11 ENCOUNTER — Ambulatory Visit (HOSPITAL_BASED_OUTPATIENT_CLINIC_OR_DEPARTMENT_OTHER): Payer: Medicaid Other

## 2014-12-11 ENCOUNTER — Other Ambulatory Visit (HOSPITAL_BASED_OUTPATIENT_CLINIC_OR_DEPARTMENT_OTHER): Payer: Medicaid Other

## 2014-12-11 VITALS — BP 115/79 | HR 76 | Temp 98.6°F | Wt 212.2 lb

## 2014-12-11 DIAGNOSIS — E539 Vitamin B deficiency, unspecified: Secondary | ICD-10-CM

## 2014-12-11 DIAGNOSIS — D61818 Other pancytopenia: Secondary | ICD-10-CM

## 2014-12-11 DIAGNOSIS — R29898 Other symptoms and signs involving the musculoskeletal system: Secondary | ICD-10-CM

## 2014-12-11 DIAGNOSIS — G8220 Paraplegia, unspecified: Secondary | ICD-10-CM

## 2014-12-11 DIAGNOSIS — E538 Deficiency of other specified B group vitamins: Secondary | ICD-10-CM

## 2014-12-11 DIAGNOSIS — G63 Polyneuropathy in diseases classified elsewhere: Secondary | ICD-10-CM

## 2014-12-11 LAB — CBC WITH DIFFERENTIAL/PLATELET
BASO%: 0.1 % (ref 0.0–2.0)
Basophils Absolute: 0 10*3/uL (ref 0.0–0.1)
EOS%: 1.5 % (ref 0.0–7.0)
Eosinophils Absolute: 0.1 10*3/uL (ref 0.0–0.5)
HCT: 34.9 % (ref 34.8–46.6)
HGB: 11.3 g/dL — ABNORMAL LOW (ref 11.6–15.9)
LYMPH%: 27.4 % (ref 14.0–49.7)
MCH: 26 pg (ref 25.1–34.0)
MCHC: 32.4 g/dL (ref 31.5–36.0)
MCV: 80.4 fL (ref 79.5–101.0)
MONO#: 0.4 10*3/uL (ref 0.1–0.9)
MONO%: 6.6 % (ref 0.0–14.0)
NEUT#: 4.3 10*3/uL (ref 1.5–6.5)
NEUT%: 64.4 % (ref 38.4–76.8)
Platelets: 261 10*3/uL (ref 145–400)
RBC: 4.34 10*6/uL (ref 3.70–5.45)
RDW: 15.5 % — ABNORMAL HIGH (ref 11.2–14.5)
WBC: 6.7 10*3/uL (ref 3.9–10.3)
lymph#: 1.8 10*3/uL (ref 0.9–3.3)
nRBC: 0 % (ref 0–0)

## 2014-12-11 MED ORDER — CYANOCOBALAMIN 1000 MCG/ML IJ SOLN
1000.0000 ug | Freq: Once | INTRAMUSCULAR | Status: AC
  Administered 2014-12-11: 1000 ug via INTRAMUSCULAR

## 2015-01-06 ENCOUNTER — Other Ambulatory Visit: Payer: Self-pay | Admitting: *Deleted

## 2015-01-06 DIAGNOSIS — E538 Deficiency of other specified B group vitamins: Secondary | ICD-10-CM

## 2015-01-06 DIAGNOSIS — D61818 Other pancytopenia: Secondary | ICD-10-CM

## 2015-01-07 ENCOUNTER — Telehealth: Payer: Self-pay | Admitting: Oncology

## 2015-01-07 ENCOUNTER — Other Ambulatory Visit: Payer: Self-pay | Admitting: Oncology

## 2015-01-07 ENCOUNTER — Ambulatory Visit (HOSPITAL_BASED_OUTPATIENT_CLINIC_OR_DEPARTMENT_OTHER): Payer: Medicaid Other

## 2015-01-07 ENCOUNTER — Ambulatory Visit (HOSPITAL_BASED_OUTPATIENT_CLINIC_OR_DEPARTMENT_OTHER): Payer: Medicaid Other | Admitting: Oncology

## 2015-01-07 ENCOUNTER — Other Ambulatory Visit (HOSPITAL_BASED_OUTPATIENT_CLINIC_OR_DEPARTMENT_OTHER): Payer: Medicaid Other

## 2015-01-07 ENCOUNTER — Other Ambulatory Visit: Payer: Self-pay

## 2015-01-07 VITALS — BP 138/75 | HR 77 | Temp 98.3°F | Resp 18 | Ht 63.0 in | Wt 207.9 lb

## 2015-01-07 DIAGNOSIS — D519 Vitamin B12 deficiency anemia, unspecified: Secondary | ICD-10-CM

## 2015-01-07 DIAGNOSIS — E538 Deficiency of other specified B group vitamins: Secondary | ICD-10-CM

## 2015-01-07 DIAGNOSIS — G63 Polyneuropathy in diseases classified elsewhere: Secondary | ICD-10-CM

## 2015-01-07 DIAGNOSIS — G320 Subacute combined degeneration of spinal cord in diseases classified elsewhere: Secondary | ICD-10-CM | POA: Diagnosis not present

## 2015-01-07 DIAGNOSIS — F3340 Major depressive disorder, recurrent, in remission, unspecified: Secondary | ICD-10-CM

## 2015-01-07 DIAGNOSIS — Z79899 Other long term (current) drug therapy: Secondary | ICD-10-CM | POA: Diagnosis not present

## 2015-01-07 DIAGNOSIS — E539 Vitamin B deficiency, unspecified: Secondary | ICD-10-CM

## 2015-01-07 DIAGNOSIS — D61818 Other pancytopenia: Secondary | ICD-10-CM

## 2015-01-07 DIAGNOSIS — E43 Unspecified severe protein-calorie malnutrition: Secondary | ICD-10-CM

## 2015-01-07 DIAGNOSIS — G379 Demyelinating disease of central nervous system, unspecified: Secondary | ICD-10-CM | POA: Diagnosis not present

## 2015-01-07 DIAGNOSIS — G8220 Paraplegia, unspecified: Secondary | ICD-10-CM

## 2015-01-07 DIAGNOSIS — R29898 Other symptoms and signs involving the musculoskeletal system: Secondary | ICD-10-CM

## 2015-01-07 LAB — TSH: TSH: 1.047 m(IU)/L (ref 0.308–3.960)

## 2015-01-07 LAB — CBC WITH DIFFERENTIAL/PLATELET
BASO%: 0.2 % (ref 0.0–2.0)
Basophils Absolute: 0 10*3/uL (ref 0.0–0.1)
EOS%: 2.3 % (ref 0.0–7.0)
Eosinophils Absolute: 0.1 10*3/uL (ref 0.0–0.5)
HCT: 35.9 % (ref 34.8–46.6)
HGB: 11.5 g/dL — ABNORMAL LOW (ref 11.6–15.9)
LYMPH%: 36.4 % (ref 14.0–49.7)
MCH: 25.5 pg (ref 25.1–34.0)
MCHC: 32 g/dL (ref 31.5–36.0)
MCV: 79.6 fL (ref 79.5–101.0)
MONO#: 0.4 10*3/uL (ref 0.1–0.9)
MONO%: 6.9 % (ref 0.0–14.0)
NEUT#: 3.3 10*3/uL (ref 1.5–6.5)
NEUT%: 54.2 % (ref 38.4–76.8)
Platelets: 291 10*3/uL (ref 145–400)
RBC: 4.51 10*6/uL (ref 3.70–5.45)
RDW: 14.7 % — ABNORMAL HIGH (ref 11.2–14.5)
WBC: 6.1 10*3/uL (ref 3.9–10.3)
lymph#: 2.2 10*3/uL (ref 0.9–3.3)

## 2015-01-07 LAB — VITAMIN B12: Vitamin B-12: 294 pg/mL (ref 211–911)

## 2015-01-07 MED ORDER — CYANOCOBALAMIN 1000 MCG/ML IJ SOLN
1000.0000 ug | Freq: Once | INTRAMUSCULAR | Status: AC
  Administered 2015-01-07: 1000 ug via INTRAMUSCULAR

## 2015-01-07 MED ORDER — BUPROPION HCL ER (XL) 150 MG PO TB24: 150.0000 mg | ORAL_TABLET | Freq: Every day | ORAL | Status: DC

## 2015-01-07 MED ORDER — B COMPLEX-C PO TABS: 1.0000 | ORAL_TABLET | Freq: Every day | ORAL | Status: DC

## 2015-01-07 MED ORDER — ALBUTEROL SULFATE HFA 108 (90 BASE) MCG/ACT IN AERS: 2.0000 | INHALATION_SPRAY | Freq: Four times a day (QID) | RESPIRATORY_TRACT | Status: DC | PRN

## 2015-01-07 MED ORDER — MECLIZINE HCL 25 MG PO TABS: 25.0000 mg | ORAL_TABLET | Freq: Two times a day (BID) | ORAL | Status: DC | PRN

## 2015-01-07 NOTE — Telephone Encounter (Signed)
Gave patient avs report and appointments for January thru December 2017

## 2015-01-07 NOTE — Progress Notes (Signed)
ID: Jill Summers OB: Jul 11, 1985  MR#: [id number]  NWG#:[id number]  PCP: Lora Paula, MD GYN:   SU:  OTHER MD:  CHIEF COMPLAINT: vit B12 anemia, subacute combined degeneration  CURRENT THERAPY: month vit B12 injections  HISTORY OF PRESENT ILLNESS: From the earlier summary:  Jill Summers is a right handed female with history of depression and bronchial asthma as well as recent cellulitis of the umbilicus and treated with Bactrim in September 2015 as well as recent MRSA PCR screen +2 months ago as well as previously untreated B-12 deficiency. Patient lives with children independent prior to admission and her mother has been assisting with care of the children recently. Admitted 12/16/2013 with bouts of dizziness over the past 2 weeks as well as lower strandy weakness and numbness. She denied any falls. No change in bowel or bladder. Cranial CT scan negative. MRI of thoracic lumbar and cervical spine showed multiple bilateral thoracic cord lesions in the lateral white matter most compatible with demyelinating disease of question etiology and workup ongoing as per neurology services and felt cord lesions most likely manifestations of B-12 deficiency. Partial visualization of lower cervical cord shows white matter lesions with full cervical spine films pending. MRI of the brain 12/18/2013 showed less than 10 nonspecific subcentimeter supratentorial white matter T2 hyperintensities in a atypical distribution for classic demyelination. Found to have severe pancytopenia with WBC 1.4, ANC 0.4, hemoglobin 5.5, MCV 101.9, platelets 37,000. Hematology service consulted (Dr. Darnelle Catalan) She was transfused. Etiology is secondary to B-12 deficiency and currently maintained on supplementation with plan to follow-up with hematology services mostly for B-12 injections   INTERVAL HISTORY: Jill Summers returns today for follow up of her vitamin B12 deficiency, demyelination. She has been on B-12  supplementation monthly for the last year. Very gradually she has improved. She is not using the walker anymore. She does use the shower chair because when she is standing is significant. Of time she is worried about balance issues. There have been no falls recently however. She tolerates the shots well.  REVIEW OF SYSTEMS: Jill Summers denies neuropathy symptoms. She has been having some loose bowel movements and headaches and feels depressed. She ran out of bupropion and has not had that refilled. She was also on senna previously. That also "ran out". She has mild sinus problems. She tells me her appetite is poor. On a normal day she gets up, gets her 47-year-old son ready for school, and then gets back to bed. Sometimes she does a little housework. Her mother lives next door and helps out. She then picks up her son at 2, helps with homework, does supper, and then to bed. During Thanksgiving's she went with the family to [place] and enjoyed that. She has feelings like she can't do anything, and she finds this very hard to express aside from setting that she is depressed. She is not suicidal. A detailed review of systems was otherwise stable.  PAST MEDICAL HISTORY: Past Medical History  Diagnosis Date  . Obesity   . Adopted   . Chlamydia   . Bacterial vaginosis   . Urinary tract infection   . MRSA infection   . Asthma     since baby  . Depression     At age 29    PAST SURGICAL HISTORY: Past Surgical History  Procedure Laterality Date  . Cesarean section    . Cesarean section with bilateral tubal ligation      FAMILY HISTORY Family History  Problem Relation  Age of Onset  . Adopted: Yes  Mother - hypertension, Father - diabetes  GYNECOLOGIC HISTORY:  Patient has regular menstrual periods. She is status post bilateral tubal ligation. Menarche age 29, she is GX P2    SOCIAL HISTORY: (Updated December 2016 was ( Disabled due to asthma; lives with 914 y/o son Jill Summers. Mother Jill Summers  lives next door--she is a retired [employer] SW-- with patient's 9 y/o child, Jill Summers.  ADVANCED DIRECTIVES: not in place    HEALTH MAINTENANCE: Social History  Substance Use Topics  . Smoking status: Never Smoker   . Smokeless tobacco: Never Used  . Alcohol Use: No    Allergies  Allergen Reactions  . Peanut-Containing Drug Products Anaphylaxis, Hives and Swelling    *Throat swelling*  . Shellfish Allergy Anaphylaxis, Hives and Swelling    Throat swelling    Current Outpatient Prescriptions  Medication Sig Dispense Refill  . albuterol (PROVENTIL HFA;VENTOLIN HFA) 108 (90 BASE) MCG/ACT inhaler Inhale 2 puffs into the lungs every 6 (six) hours as needed for wheezing or shortness of breath. For asthma    . ALPRAZolam (XANAX) 0.25 MG tablet Take 1 tablet (0.25 mg total) by mouth 3 (three) times daily as needed for anxiety. 30 tablet 0  . B Complex-C (B-COMPLEX WITH VITAMIN C) tablet Take 1 tablet by mouth daily. 30 tablet 1  . buPROPion (WELLBUTRIN XL) 150 MG 24 hr tablet Take 1 tablet (150 mg total) by mouth daily. 30 tablet 1  . cyanocobalamin (,VITAMIN B-12,) 1000 MCG/ML injection Inject 1 mL (1,000 mcg total) into the muscle every 30 (thirty) days. 1 mL 1  . docusate sodium (COLACE) 100 MG capsule Take 1 capsule (100 mg total) by mouth 2 (two) times daily as needed for mild constipation. 40 capsule 0  . meclizine (ANTIVERT) 25 MG tablet   0  . polyethylene glycol powder (GLYCOLAX/MIRALAX) powder Take 17 g by mouth daily as needed. 3350 g 1   No current facility-administered medications for this visit.    OBJECTIVE: Young African-American woman in no acute distress Filed Vitals:   01/07/15 1306  BP: 138/75  Pulse: 77  Temp: 98.3 F (36.8 C)  Resp: 18     Body mass index is 36.84 kg/(m^2).   ROS  Physical Exam   ECOG FS:1 - Symptomatic but completely ambulatory  Right sclera slightly injected, no icterus, pupils round and equal Oropharynx clear and moist-- no thrush or  other lesions No cervical or supraclavicular adenopathy, mild thyromegaly Lungs no rales or rhonchi Heart regular rate and rhythm Abd soft, nontender, positive bowel sounds MSK no focal spinal tenderness, no upper extremity lymphedema Neuro: Entirely nonfocal, motor 5 over 5 , well oriented, appropriate affect Breasts: Deferred    LAB RESULTS:  CBC    Component Value Date/Time   WBC 6.1 01/07/2015 1250   WBC 4.0 12/26/2013 0430   RBC 4.51 01/07/2015 1250   RBC 2.74* 12/26/2013 0430   RBC 1.65* 12/15/2013 2108   HGB 11.5* 01/07/2015 1250   HGB 9.1* 12/26/2013 0430   HCT 35.9 01/07/2015 1250   HCT 28.1* 12/26/2013 0430   PLT 291 01/07/2015 1250   PLT 341 12/26/2013 0430   MCV 79.6 01/07/2015 1250   MCV 102.6* 12/26/2013 0430   MCH 25.5 01/07/2015 1250   MCH 33.2 12/26/2013 0430   MCHC 32.0 01/07/2015 1250   MCHC 32.4 12/26/2013 0430   RDW 14.7* 01/07/2015 1250   RDW 21.2* 12/26/2013 0430   LYMPHSABS 2.2 01/07/2015 1250  LYMPHSABS 1.3 12/22/2013 0455   MONOABS 0.4 01/07/2015 1250   MONOABS 0.3 12/22/2013 0455   EOSABS 0.1 01/07/2015 1250   EOSABS 0.0 12/22/2013 0455   BASOSABS 0.0 01/07/2015 1250   BASOSABS 0.0 12/22/2013 0455   CMP     Component Value Date/Time   NA 143 12/24/2013 0718   K 3.7 12/24/2013 0718   CL 106 12/24/2013 0718   CO2 22 12/24/2013 0718   GLUCOSE 101* 12/24/2013 0718   BUN 4* 12/24/2013 0718   CREATININE 0.66 12/24/2013 0718   CALCIUM 8.9 12/24/2013 0718   PROT 6.8 12/22/2013 0455   ALBUMIN 3.4* 12/22/2013 0455   AST 37 12/22/2013 0455   ALT 26 12/22/2013 0455   ALKPHOS 27* 12/22/2013 0455   BILITOT 1.7* 12/22/2013 0455   GFRNONAA >90 12/24/2013 0718   GFRAA >90 12/24/2013 0718        Component Value Date/Time   COLORURINE AMBER* 12/16/2013 0707   APPEARANCEUR HAZY* 12/16/2013 0707   LABSPEC 1.025 12/16/2013 0707   PHURINE 5.0 12/16/2013 0707   GLUCOSEU NEGATIVE 12/16/2013 0707   HGBUR LARGE* 12/16/2013 0707   BILIRUBINUR  SMALL* 12/16/2013 0707   KETONESUR NEGATIVE 12/16/2013 0707   PROTEINUR NEGATIVE 12/16/2013 0707   UROBILINOGEN >8.0* 12/16/2013 0707   NITRITE NEGATIVE 12/16/2013 0707   LEUKOCYTESUR SMALL* 12/16/2013 0707    STUDIES: No results found.  ASSESSMENT: 29 y.o.  woman presenting 11/15/2013 with inability to walk, fever and pancytopenia, found to be severely B-12 deficient  (1) B-12 deficiency: will need lifelong replacement; as patient does not have PCP this can be done through our office (a) will receive montlhy  (2) subacute combined degeneration - improving   PLAN:  Jill Summers continues to improve and it is encouraging to see what progress she has made in the past year.  I think she will do better on Wellbutrin and I am refilling that. I am refilling her other medicines as a courtesy as well. I am not refilling the Xanax. I don't believe that would be helpful to her.  She has a period from not in the morning to 1 in the afternoon which is currently empty. It would be terrific if she found something she could do and wanted to do and was helpful and profitable during those 4 hours. I think that would go a long way towards making her feel less depressed and listless.  I want to document that her demyelination is improved which will reassure [place] that nothing else is going on besides the original B-12 deficiency. Accordingly I have set her up for a brain MRI sometime this month. She will call for those results.  Otherwise we will see her again next October. We will continue the B-12 monthly indefinitely. I am adding some additional lab work next month to evaluate her thyroid function and also her iron level.    Lowella Dell, MD  01/07/2015 1:20 PM

## 2015-01-08 ENCOUNTER — Ambulatory Visit: Payer: Medicaid Other

## 2015-02-04 ENCOUNTER — Ambulatory Visit (HOSPITAL_BASED_OUTPATIENT_CLINIC_OR_DEPARTMENT_OTHER): Payer: Medicaid Other

## 2015-02-04 ENCOUNTER — Other Ambulatory Visit (HOSPITAL_BASED_OUTPATIENT_CLINIC_OR_DEPARTMENT_OTHER): Payer: Medicaid Other

## 2015-02-04 VITALS — BP 127/73 | HR 75 | Temp 98.0°F

## 2015-02-04 DIAGNOSIS — G8220 Paraplegia, unspecified: Secondary | ICD-10-CM

## 2015-02-04 DIAGNOSIS — E539 Vitamin B deficiency, unspecified: Secondary | ICD-10-CM

## 2015-02-04 DIAGNOSIS — G63 Polyneuropathy in diseases classified elsewhere: Secondary | ICD-10-CM

## 2015-02-04 DIAGNOSIS — E519 Thiamine deficiency, unspecified: Secondary | ICD-10-CM | POA: Diagnosis present

## 2015-02-04 DIAGNOSIS — R29898 Other symptoms and signs involving the musculoskeletal system: Secondary | ICD-10-CM

## 2015-02-04 DIAGNOSIS — G379 Demyelinating disease of central nervous system, unspecified: Secondary | ICD-10-CM

## 2015-02-04 DIAGNOSIS — G320 Subacute combined degeneration of spinal cord in diseases classified elsewhere: Secondary | ICD-10-CM

## 2015-02-04 DIAGNOSIS — E538 Deficiency of other specified B group vitamins: Secondary | ICD-10-CM

## 2015-02-04 DIAGNOSIS — D519 Vitamin B12 deficiency anemia, unspecified: Secondary | ICD-10-CM | POA: Diagnosis present

## 2015-02-04 DIAGNOSIS — D61818 Other pancytopenia: Secondary | ICD-10-CM

## 2015-02-04 LAB — CBC & DIFF AND RETIC
BASO%: 0 % (ref 0.0–2.0)
Basophils Absolute: 0 10*3/uL (ref 0.0–0.1)
EOS%: 2.7 % (ref 0.0–7.0)
Eosinophils Absolute: 0.2 10*3/uL (ref 0.0–0.5)
HCT: 36.1 % (ref 34.8–46.6)
HGB: 11.3 g/dL — ABNORMAL LOW (ref 11.6–15.9)
Immature Retic Fract: 14.7 % — ABNORMAL HIGH (ref 1.60–10.00)
LYMPH%: 42.8 % (ref 14.0–49.7)
MCH: 25.1 pg (ref 25.1–34.0)
MCHC: 31.3 g/dL — ABNORMAL LOW (ref 31.5–36.0)
MCV: 80 fL (ref 79.5–101.0)
MONO#: 0.2 10*3/uL (ref 0.1–0.9)
MONO%: 2.7 % (ref 0.0–14.0)
NEUT#: 2.9 10*3/uL (ref 1.5–6.5)
NEUT%: 51.8 % (ref 38.4–76.8)
Platelets: 269 10*3/uL (ref 145–400)
RBC: 4.51 10*6/uL (ref 3.70–5.45)
RDW: 15.6 % — ABNORMAL HIGH (ref 11.2–14.5)
Retic %: 1.52 % (ref 0.70–2.10)
Retic Ct Abs: 68.55 10*3/uL (ref 33.70–90.70)
WBC: 5.6 10*3/uL (ref 3.9–10.3)
lymph#: 2.4 10*3/uL (ref 0.9–3.3)

## 2015-02-04 LAB — COMPREHENSIVE METABOLIC PANEL
ALT: <9 U/L (ref 0–55)
AST: 13 U/L (ref 5–34)
Albumin: 3.4 g/dL — ABNORMAL LOW (ref 3.5–5.0)
Alkaline Phosphatase: 53 U/L (ref 40–150)
Anion Gap: 8 mEq/L (ref 3–11)
BUN: 9.8 mg/dL (ref 7.0–26.0)
CO2: 23 mEq/L (ref 22–29)
Calcium: 8.8 mg/dL (ref 8.4–10.4)
Chloride: 109 mEq/L (ref 98–109)
Creatinine: 0.9 mg/dL (ref 0.6–1.1)
EGFR: >90 mL/min/{1.73_m2} (ref >90)
Glucose: 98 mg/dl (ref 70–140)
Potassium: 3.9 mEq/L (ref 3.5–5.1)
Sodium: 140 mEq/L (ref 136–145)
Total Bilirubin: 0.47 mg/dL (ref 0.20–1.20)
Total Protein: 6.9 g/dL (ref 6.4–8.3)

## 2015-02-04 LAB — FERRITIN: Ferritin: 6 ng/ml — ABNORMAL LOW (ref 9–269)

## 2015-02-04 MED ORDER — CYANOCOBALAMIN 1000 MCG/ML IJ SOLN
1000.0000 ug | Freq: Once | INTRAMUSCULAR | Status: AC
  Administered 2015-02-04: 1000 ug via INTRAMUSCULAR

## 2015-02-06 ENCOUNTER — Encounter: Payer: Self-pay | Admitting: Oncology

## 2015-02-06 ENCOUNTER — Other Ambulatory Visit: Payer: Self-pay | Admitting: Oncology

## 2015-02-07 ENCOUNTER — Telehealth: Payer: Self-pay | Admitting: Oncology

## 2015-02-07 NOTE — Telephone Encounter (Signed)
Called and left a message with new appointments per pof 1/7

## 2015-02-08 ENCOUNTER — Ambulatory Visit (HOSPITAL_COMMUNITY): Admission: RE | Admit: 2015-02-08 | Payer: Medicaid Other | Source: Ambulatory Visit

## 2015-02-09 ENCOUNTER — Other Ambulatory Visit: Payer: Self-pay | Admitting: *Deleted

## 2015-02-12 ENCOUNTER — Ambulatory Visit: Payer: Medicaid Other

## 2015-02-17 ENCOUNTER — Ambulatory Visit (HOSPITAL_COMMUNITY): Payer: Medicaid Other

## 2015-03-04 ENCOUNTER — Ambulatory Visit: Payer: Medicaid Other

## 2015-03-04 ENCOUNTER — Other Ambulatory Visit: Payer: Self-pay | Admitting: *Deleted

## 2015-03-04 ENCOUNTER — Telehealth: Payer: Self-pay | Admitting: *Deleted

## 2015-03-04 ENCOUNTER — Ambulatory Visit: Payer: Medicaid Other | Admitting: Nurse Practitioner

## 2015-03-04 ENCOUNTER — Other Ambulatory Visit: Payer: Medicaid Other

## 2015-03-04 NOTE — Telephone Encounter (Signed)
Called pt to inquire about missed appt for today. Pt said she didn't know anything about appt today. I have consulted with NP and have sent pof for pt to get labs, see Jill Summers on 2/9 as she already has appt on this day to receive IV Fereheme and I also put in pof for her to receive her injection (B-12) in infusion room as prev scheduled. This  Jill Summers will be her 1st infusion. I have spoken with pt directly to inform her of these appts, but I told her she will receive a message from schedulers as well. Pt verbalized understanding and wrote appts down. Message to be fwd to H.Boelter,NP.

## 2015-03-09 ENCOUNTER — Telehealth: Payer: Self-pay | Admitting: Oncology

## 2015-03-09 NOTE — Telephone Encounter (Signed)
Added additional appointments for 2/9. Spoke with patient re new time for 2/9 @ 10:15 am.

## 2015-03-11 ENCOUNTER — Ambulatory Visit (HOSPITAL_BASED_OUTPATIENT_CLINIC_OR_DEPARTMENT_OTHER): Payer: Medicaid Other

## 2015-03-11 ENCOUNTER — Other Ambulatory Visit: Payer: Self-pay | Admitting: Medical Oncology

## 2015-03-11 ENCOUNTER — Telehealth: Payer: Self-pay | Admitting: *Deleted

## 2015-03-11 ENCOUNTER — Telehealth: Payer: Self-pay | Admitting: Oncology

## 2015-03-11 ENCOUNTER — Other Ambulatory Visit (HOSPITAL_BASED_OUTPATIENT_CLINIC_OR_DEPARTMENT_OTHER): Payer: Medicaid Other

## 2015-03-11 ENCOUNTER — Ambulatory Visit: Payer: Medicaid Other

## 2015-03-11 ENCOUNTER — Encounter: Payer: Self-pay | Admitting: Nurse Practitioner

## 2015-03-11 ENCOUNTER — Ambulatory Visit (HOSPITAL_BASED_OUTPATIENT_CLINIC_OR_DEPARTMENT_OTHER): Payer: Medicaid Other | Admitting: Nurse Practitioner

## 2015-03-11 VITALS — BP 118/69 | HR 85 | Temp 98.0°F | Resp 16 | Wt 209.4 lb

## 2015-03-11 DIAGNOSIS — E611 Iron deficiency: Secondary | ICD-10-CM

## 2015-03-11 DIAGNOSIS — D519 Vitamin B12 deficiency anemia, unspecified: Secondary | ICD-10-CM | POA: Diagnosis present

## 2015-03-11 DIAGNOSIS — E539 Vitamin B deficiency, unspecified: Secondary | ICD-10-CM

## 2015-03-11 DIAGNOSIS — E538 Deficiency of other specified B group vitamins: Secondary | ICD-10-CM

## 2015-03-11 DIAGNOSIS — G63 Polyneuropathy in diseases classified elsewhere: Secondary | ICD-10-CM

## 2015-03-11 DIAGNOSIS — G379 Demyelinating disease of central nervous system, unspecified: Secondary | ICD-10-CM

## 2015-03-11 DIAGNOSIS — R29898 Other symptoms and signs involving the musculoskeletal system: Secondary | ICD-10-CM

## 2015-03-11 DIAGNOSIS — D61818 Other pancytopenia: Secondary | ICD-10-CM

## 2015-03-11 DIAGNOSIS — G8220 Paraplegia, unspecified: Secondary | ICD-10-CM

## 2015-03-11 DIAGNOSIS — D509 Iron deficiency anemia, unspecified: Secondary | ICD-10-CM

## 2015-03-11 DIAGNOSIS — G320 Subacute combined degeneration of spinal cord in diseases classified elsewhere: Secondary | ICD-10-CM | POA: Diagnosis not present

## 2015-03-11 LAB — CBC WITH DIFFERENTIAL/PLATELET
BASO%: 0 % (ref 0.0–2.0)
Basophils Absolute: 0 10*3/uL (ref 0.0–0.1)
EOS%: 2.2 % (ref 0.0–7.0)
Eosinophils Absolute: 0.1 10*3/uL (ref 0.0–0.5)
HCT: 34 % — ABNORMAL LOW (ref 34.8–46.6)
HGB: 10.8 g/dL — ABNORMAL LOW (ref 11.6–15.9)
LYMPH%: 37.5 % (ref 14.0–49.7)
MCH: 25.3 pg (ref 25.1–34.0)
MCHC: 31.8 g/dL (ref 31.5–36.0)
MCV: 79.6 fL (ref 79.5–101.0)
MONO#: 0.2 10*3/uL (ref 0.1–0.9)
MONO%: 3.9 % (ref 0.0–14.0)
NEUT#: 2.9 10*3/uL (ref 1.5–6.5)
NEUT%: 56.4 % (ref 38.4–76.8)
Platelets: 238 10*3/uL (ref 145–400)
RBC: 4.27 10*6/uL (ref 3.70–5.45)
RDW: 16.3 % — ABNORMAL HIGH (ref 11.2–14.5)
WBC: 5.1 10*3/uL (ref 3.9–10.3)
lymph#: 1.9 10*3/uL (ref 0.9–3.3)
nRBC: 0 % (ref 0–0)

## 2015-03-11 MED ORDER — SODIUM CHLORIDE 0.9 % IV SOLN
INTRAVENOUS | Status: DC
  Administered 2015-03-11: 13:00:00 via INTRAVENOUS

## 2015-03-11 MED ORDER — CYANOCOBALAMIN 1000 MCG/ML IJ SOLN
1000.0000 ug | Freq: Once | INTRAMUSCULAR | Status: AC
  Administered 2015-03-11: 1000 ug via INTRAMUSCULAR

## 2015-03-11 MED ORDER — SODIUM CHLORIDE 0.9 % IV SOLN: INTRAVENOUS | Status: DC

## 2015-03-11 MED ORDER — CYANOCOBALAMIN 1000 MCG/ML IJ SOLN
INTRAMUSCULAR | Status: AC
  Filled 2015-03-11: qty 1

## 2015-03-11 MED ORDER — SODIUM CHLORIDE 0.9 % IV SOLN
510.0000 mg | Freq: Once | INTRAVENOUS | Status: AC
  Administered 2015-03-11: 510 mg via INTRAVENOUS
  Filled 2015-03-11: qty 17

## 2015-03-11 NOTE — Telephone Encounter (Signed)
Staff message sent Marcelino Duster per POF.

## 2015-03-11 NOTE — Progress Notes (Signed)
ID: Jill Summers OB: 05-Jun-1985  MR#: [id number]  WUJ#:[id number]  PCP: Lora Paula, MD GYN:   SU:  OTHER MD:  CHIEF COMPLAINT: vit B12 anemia, subacute combined degeneration  CURRENT THERAPY: month vit B12 injections  HISTORY OF PRESENT ILLNESS: From the earlier summary:  Jill Summers is a right handed female with history of depression and bronchial asthma as well as recent cellulitis of the umbilicus and treated with Bactrim in September 2015 as well as recent MRSA PCR screen +2 months ago as well as previously untreated B-12 deficiency. Patient lives with children independent prior to admission and her mother has been assisting with care of the children recently. Admitted 12/16/2013 with bouts of dizziness over the past 2 weeks as well as lower strandy weakness and numbness. She denied any falls. No change in bowel or bladder. Cranial CT scan negative. MRI of thoracic lumbar and cervical spine showed multiple bilateral thoracic cord lesions in the lateral white matter most compatible with demyelinating disease of question etiology and workup ongoing as per neurology services and felt cord lesions most likely manifestations of B-12 deficiency. Partial visualization of lower cervical cord shows white matter lesions with full cervical spine films pending. MRI of the brain 12/18/2013 showed less than 10 nonspecific subcentimeter supratentorial white matter T2 hyperintensities in a atypical distribution for classic demyelination. Found to have severe pancytopenia with WBC 1.4, ANC 0.4, hemoglobin 5.5, MCV 101.9, platelets 37,000. Hematology service consulted (Dr. Darnelle Catalan) She was transfused. Etiology is secondary to B-12 deficiency and currently maintained on supplementation with plan to follow-up with hematology services mostly for B-12 injections   INTERVAL HISTORY: Jill Summers returns today for follow up of her vitamin B12 deficiency, and demyelination condition. She continues on  monthly B-12 injections and tolerates these well. She is going to have the first of 2 feraheme infusions, since a recent iron deficiency was discovered. She denies dark or bloody stools, hematuria, or gnawing abdominal pain.   REVIEW OF SYSTEMS: Jill Summers's strength is good. She is walking without the use of canes or walkers. She still has the shower chair, but does not rely on it very heavily. She has occasional tingling to her toes, but denies changes to her fingertips. She has no recent falls. Her balance is good. She denies headaches, dizziness, or vision changes. Her appetite is not consistent. She is depressed, and takes bupropion daily. Her energy level is fair. A detailed review of systems is otherwise stable.  PAST MEDICAL HISTORY: Past Medical History  Diagnosis Date  . Obesity   . Adopted   . Chlamydia   . Bacterial vaginosis   . Urinary tract infection   . MRSA infection   . Asthma     since baby  . Depression     At age 5    PAST SURGICAL HISTORY: Past Surgical History  Procedure Laterality Date  . Cesarean section    . Cesarean section with bilateral tubal ligation      FAMILY HISTORY Family History  Problem Relation Age of Onset  . Adopted: Yes  Mother - hypertension, Father - diabetes  GYNECOLOGIC HISTORY:  Patient has regular menstrual periods. She is status post bilateral tubal ligation. Menarche age 59, she is GX P2    SOCIAL HISTORY: (Updated December 2016 was ( Disabled due to asthma; lives with 6 y/o son Madison. Mother Sage Hammill lives next door--she is a retired [employer] administrator SW-- with patient's 9 y/o child, [name].  ADVANCED DIRECTIVES: not in place  HEALTH MAINTENANCE: Social History  Substance Use Topics  . Smoking status: Never Smoker   . Smokeless tobacco: Never Used  . Alcohol Use: No    Allergies  Allergen Reactions  . Peanut-Containing Drug Products Anaphylaxis, Hives and Swelling    *Throat swelling*  . Shellfish Allergy  Anaphylaxis, Hives and Swelling    Throat swelling    Current Outpatient Prescriptions  Medication Sig Dispense Refill  . albuterol (PROVENTIL HFA;VENTOLIN HFA) 108 (90 BASE) MCG/ACT inhaler Inhale 2 puffs into the lungs every 6 (six) hours as needed for wheezing or shortness of breath. For asthma 1 Inhaler 12  . buPROPion (WELLBUTRIN XL) 150 MG 24 hr tablet Take 1 tablet (150 mg total) by mouth daily. 30 tablet 1  . cyanocobalamin (,VITAMIN B-12,) 1000 MCG/ML injection Inject 1 mL (1,000 mcg total) into the muscle every 30 (thirty) days. 1 mL 1  . B Complex-C (B-COMPLEX WITH VITAMIN C) tablet Take 1 tablet by mouth daily. (Patient not taking: Reported on 03/11/2015) 30 tablet 1  . meclizine (ANTIVERT) 25 MG tablet Take 1 tablet (25 mg total) by mouth 2 (two) times daily as needed for dizziness. (Patient not taking: Reported on 03/11/2015) 60 tablet 12   No current facility-administered medications for this visit.    OBJECTIVE: Young African-American woman in no acute distress Filed Vitals:   03/11/15 1040  BP: 118/69  Pulse: 85  Temp: 98 F (36.7 C)  Resp: 16     Body mass index is 37.1 kg/(m^2).   ROS  Physical Exam   ECOG FS:1 - Symptomatic but completely ambulatory  Skin: warm, dry  HEENT: sclerae anicteric, conjunctivae pink, oropharynx clear. No thrush or mucositis.  Lymph Nodes: No cervical or supraclavicular lymphadenopathy  Lungs: clear to auscultation bilaterally, no rales, wheezes, or rhonci  Heart: regular rate and rhythm  Abdomen: round, soft, non tender, positive bowel sounds  Musculoskeletal: No focal spinal tenderness, no peripheral edema, no upper or lower extremity weakness Neuro: non focal, well oriented, positive affect  Breasts: deferred   LAB RESULTS:  CBC    Component Value Date/Time   WBC 5.1 03/11/2015 1023   WBC 4.0 12/26/2013 0430   RBC 4.27 03/11/2015 1023   RBC 2.74* 12/26/2013 0430   RBC 1.65* 12/15/2013 2108   HGB 10.8* 03/11/2015 1023    HGB 9.1* 12/26/2013 0430   HCT 34.0* 03/11/2015 1023   HCT 28.1* 12/26/2013 0430   PLT 238 03/11/2015 1023   PLT 341 12/26/2013 0430   MCV 79.6 03/11/2015 1023   MCV 102.6* 12/26/2013 0430   MCH 25.3 03/11/2015 1023   MCH 33.2 12/26/2013 0430   MCHC 31.8 03/11/2015 1023   MCHC 32.4 12/26/2013 0430   RDW 16.3* 03/11/2015 1023   RDW 21.2* 12/26/2013 0430   LYMPHSABS 1.9 03/11/2015 1023   LYMPHSABS 1.3 12/22/2013 0455   MONOABS 0.2 03/11/2015 1023   MONOABS 0.3 12/22/2013 0455   EOSABS 0.1 03/11/2015 1023   EOSABS 0.0 12/22/2013 0455   BASOSABS 0.0 03/11/2015 1023   BASOSABS 0.0 12/22/2013 0455   CMP     Component Value Date/Time   NA 140 02/04/2015 1049   NA 143 12/24/2013 0718   K 3.9 02/04/2015 1049   K 3.7 12/24/2013 0718   CL 106 12/24/2013 0718   CO2 23 02/04/2015 1049   CO2 22 12/24/2013 0718   GLUCOSE 98 02/04/2015 1049   GLUCOSE 101* 12/24/2013 0718   BUN 9.8 02/04/2015 1049   BUN 4* 12/24/2013 1610  CREATININE 0.9 02/04/2015 1049   CREATININE 0.66 12/24/2013 0718   CALCIUM 8.8 02/04/2015 1049   CALCIUM 8.9 12/24/2013 0718   PROT 6.9 02/04/2015 1049   PROT 6.8 12/22/2013 0455   ALBUMIN 3.4* 02/04/2015 1049   ALBUMIN 3.4* 12/22/2013 0455   AST 13 02/04/2015 1049   AST 37 12/22/2013 0455   ALT <9 02/04/2015 1049   ALT 26 12/22/2013 0455   ALKPHOS 53 02/04/2015 1049   ALKPHOS 27* 12/22/2013 0455   BILITOT 0.47 02/04/2015 1049   BILITOT 1.7* 12/22/2013 0455   GFRNONAA >90 12/24/2013 0718   GFRAA >90 12/24/2013 0718        Component Value Date/Time   COLORURINE AMBER* 12/16/2013 0707   APPEARANCEUR HAZY* 12/16/2013 0707   LABSPEC 1.025 12/16/2013 0707   PHURINE 5.0 12/16/2013 0707   GLUCOSEU NEGATIVE 12/16/2013 0707   HGBUR LARGE* 12/16/2013 0707   BILIRUBINUR SMALL* 12/16/2013 0707   KETONESUR NEGATIVE 12/16/2013 0707   PROTEINUR NEGATIVE 12/16/2013 0707   UROBILINOGEN >8.0* 12/16/2013 0707   NITRITE NEGATIVE 12/16/2013 0707   LEUKOCYTESUR  SMALL* 12/16/2013 0707    STUDIES: No results found.  ASSESSMENT: 30 y.o. [place] woman presenting 11/15/2013 with inability to walk, fever and pancytopenia, found to be severely B-12 deficient  (1) B-12 deficiency: will need lifelong replacement; as patient does not have PCP this can be done through our office (a) will receive montlhy  (2) subacute combined degeneration - improving  (3) iron deficiency: ferritin level on 02/04/15 was 6. Feraheme given on 03/11/15 and 03/18/15.   PLAN:  I explained to Jill Summers about her low iron levels, and how feraheme will help improve this as well as her anemia. She understands that her B12 deficiency is a separate issue and she is still to get her monthly injections, the next of which is due today.   The brain MRI ordered by Dr. Darnelle Catalan was denied according to the patient. I am trying to investigate why through our pre-certification coordinator. Jill Summers remembers her letter saying something about not having enough information from her provider. The reason for this brain MRI of course is to monitor her demyelinating disease. Her last brain MRI was in November 2015.   Jill Summers will return next week for her 2nd feraheme infusion. She is already scheduled for monthly B12 injections and her next follow up visit with Dr. Darnelle Catalan in December. She understands and agrees with this plan. She has been encouraged to call with any issues that might arise before her next visit here.   Jill Slider, NP 03/11/2015 10:58 AM

## 2015-03-11 NOTE — Patient Instructions (Signed)
Cyanocobalamin, Vitamin B12 injection What is this medicine? CYANOCOBALAMIN (sye an oh koe BAL a min) is a man made form of vitamin B12. Vitamin B12 is used in the growth of healthy blood cells, nerve cells, and proteins in the body. It also helps with the metabolism of fats and carbohydrates. This medicine is used to treat people who can not absorb vitamin B12. This medicine may be used for other purposes; ask your health care provider or pharmacist if you have questions. What should I tell my health care provider before I take this medicine? They need to know if you have any of these conditions: -kidney disease -Leber's disease -megaloblastic anemia -an unusual or allergic reaction to cyanocobalamin, cobalt, other medicines, foods, dyes, or preservatives -pregnant or trying to get pregnant -breast-feeding How should I use this medicine? This medicine is injected into a muscle or deeply under the skin. It is usually given by a health care professional in a clinic or doctor's office. However, your doctor may teach you how to inject yourself. Follow all instructions. Talk to your pediatrician regarding the use of this medicine in children. Special care may be needed. Overdosage: If you think you have taken too much of this medicine contact a poison control center or emergency room at once. NOTE: This medicine is only for you. Do not share this medicine with others. What if I miss a dose? If you are given your dose at a clinic or doctor's office, call to reschedule your appointment. If you give your own injections and you miss a dose, take it as soon as you can. If it is almost time for your next dose, take only that dose. Do not take double or extra doses. What may interact with this medicine? -colchicine -heavy alcohol intake This list may not describe all possible interactions. Give your health care provider a list of all the medicines, herbs, non-prescription drugs, or dietary supplements you  use. Also tell them if you smoke, drink alcohol, or use illegal drugs. Some items may interact with your medicine. What should I watch for while using this medicine? Visit your doctor or health care professional regularly. You may need blood work done while you are taking this medicine. You may need to follow a special diet. Talk to your doctor. Limit your alcohol intake and avoid smoking to get the best benefit. What side effects may I notice from receiving this medicine? Side effects that you should report to your doctor or health care professional as soon as possible: -allergic reactions like skin rash, itching or hives, swelling of the face, lips, or tongue -blue tint to skin -chest tightness, pain -difficulty breathing, wheezing -dizziness -red, swollen painful area on the leg Side effects that usually do not require medical attention (report to your doctor or health care professional if they continue or are bothersome): -diarrhea -headache This list may not describe all possible side effects. Call your doctor for medical advice about side effects. You may report side effects to FDA at 1-800-FDA-1088. Where should I keep my medicine? Keep out of the reach of children. Store at room temperature between 15 and 30 degrees C (59 and 85 degrees F). Protect from light. Throw away any unused medicine after the expiration date. NOTE: This sheet is a summary. It may not cover all possible information. If you have questions about this medicine, talk to your doctor, pharmacist, or health care provider.    2016, Elsevier/Gold Standard. (2007-04-29 22:10:20)    Ferumoxytol injection What   is this medicine? FERUMOXYTOL is an iron complex. Iron is used to make healthy red blood cells, which carry oxygen and nutrients throughout the body. This medicine is used to treat iron deficiency anemia in people with chronic kidney disease. This medicine may be used for other purposes; ask your health care  provider or pharmacist if you have questions. What should I tell my health care provider before I take this medicine? They need to know if you have any of these conditions: -anemia not caused by low iron levels -high levels of iron in the blood -magnetic resonance imaging (MRI) test scheduled -an unusual or allergic reaction to iron, other medicines, foods, dyes, or preservatives -pregnant or trying to get pregnant -breast-feeding How should I use this medicine? This medicine is for injection into a vein. It is given by a health care professional in a hospital or clinic setting. Talk to your pediatrician regarding the use of this medicine in children. Special care may be needed. Overdosage: If you think you have taken too much of this medicine contact a poison control center or emergency room at once. NOTE: This medicine is only for you. Do not share this medicine with others. What if I miss a dose? It is important not to miss your dose. Call your doctor or health care professional if you are unable to keep an appointment. What may interact with this medicine? This medicine may interact with the following medications: -other iron products This list may not describe all possible interactions. Give your health care provider a list of all the medicines, herbs, non-prescription drugs, or dietary supplements you use. Also tell them if you smoke, drink alcohol, or use illegal drugs. Some items may interact with your medicine. What should I watch for while using this medicine? Visit your doctor or healthcare professional regularly. Tell your doctor or healthcare professional if your symptoms do not start to get better or if they get worse. You may need blood work done while you are taking this medicine. You may need to follow a special diet. Talk to your doctor. Foods that contain iron include: whole grains/cereals, dried fruits, beans, or peas, leafy green vegetables, and organ meats (liver,  kidney). What side effects may I notice from receiving this medicine? Side effects that you should report to your doctor or health care professional as soon as possible: -allergic reactions like skin rash, itching or hives, swelling of the face, lips, or tongue -breathing problems -changes in blood pressure -feeling faint or lightheaded, falls -fever or chills -flushing, sweating, or hot feelings -swelling of the ankles or feet Side effects that usually do not require medical attention (Report these to your doctor or health care professional if they continue or are bothersome.): -diarrhea -headache -nausea, vomiting -stomach pain This list may not describe all possible side effects. Call your doctor for medical advice about side effects. You may report side effects to FDA at 1-800-FDA-1088. Where should I keep my medicine? This drug is given in a hospital or clinic and will not be stored at home. NOTE: This sheet is a summary. It may not cover all possible information. If you have questions about this medicine, talk to your doctor, pharmacist, or health care provider.    2016, Elsevier/Gold Standard. (2011-09-01 15:23:36)  

## 2015-03-11 NOTE — Telephone Encounter (Signed)
Per staff message and POF I have scheduled appts. Advised scheduler of appts. JMW  

## 2015-03-11 NOTE — Telephone Encounter (Signed)
No available on 2/16 for treatment, moved to 2/17

## 2015-03-11 NOTE — Telephone Encounter (Signed)
Infusion appointment for next week 2/17 has been added - infusion at capacity 2/16. Spoke with infusion nurse patient will be informed of 2/17 and given avs report in infusion area.

## 2015-03-11 NOTE — Progress Notes (Signed)
Vitamin B12 injection given by infusion nurse 

## 2015-03-19 ENCOUNTER — Other Ambulatory Visit: Payer: Self-pay | Admitting: Nurse Practitioner

## 2015-03-19 ENCOUNTER — Ambulatory Visit (HOSPITAL_BASED_OUTPATIENT_CLINIC_OR_DEPARTMENT_OTHER): Payer: Medicaid Other

## 2015-03-19 ENCOUNTER — Other Ambulatory Visit: Payer: Self-pay | Admitting: *Deleted

## 2015-03-19 VITALS — BP 131/84 | HR 82 | Temp 98.8°F | Resp 18

## 2015-03-19 DIAGNOSIS — G8220 Paraplegia, unspecified: Secondary | ICD-10-CM

## 2015-03-19 DIAGNOSIS — E538 Deficiency of other specified B group vitamins: Secondary | ICD-10-CM

## 2015-03-19 DIAGNOSIS — D519 Vitamin B12 deficiency anemia, unspecified: Secondary | ICD-10-CM | POA: Diagnosis not present

## 2015-03-19 DIAGNOSIS — E611 Iron deficiency: Secondary | ICD-10-CM | POA: Diagnosis not present

## 2015-03-19 DIAGNOSIS — D61818 Other pancytopenia: Secondary | ICD-10-CM

## 2015-03-19 DIAGNOSIS — G63 Polyneuropathy in diseases classified elsewhere: Secondary | ICD-10-CM

## 2015-03-19 DIAGNOSIS — E539 Vitamin B deficiency, unspecified: Secondary | ICD-10-CM

## 2015-03-19 MED ORDER — SODIUM CHLORIDE 0.9 % IV SOLN
INTRAVENOUS | Status: DC
  Administered 2015-03-19: 11:00:00 via INTRAVENOUS

## 2015-03-19 MED ORDER — SODIUM CHLORIDE 0.9 % IV SOLN
510.0000 mg | Freq: Once | INTRAVENOUS | Status: AC
  Administered 2015-03-19: 510 mg via INTRAVENOUS
  Filled 2015-03-19: qty 17

## 2015-03-19 NOTE — Patient Instructions (Signed)

## 2015-03-25 ENCOUNTER — Ambulatory Visit (HOSPITAL_COMMUNITY)
Admission: RE | Admit: 2015-03-25 | Discharge: 2015-03-25 | Disposition: A | Discharge status: Home / Self Care | Payer: Medicaid Other | Source: Ambulatory Visit | Attending: Nurse Practitioner | Admitting: Nurse Practitioner

## 2015-03-25 ENCOUNTER — Other Ambulatory Visit: Payer: Self-pay | Admitting: Nurse Practitioner

## 2015-03-25 DIAGNOSIS — G379 Demyelinating disease of central nervous system, unspecified: Secondary | ICD-10-CM

## 2015-03-25 MED ORDER — GADOBENATE DIMEGLUMINE 529 MG/ML IV SOLN
20.0000 mL | Freq: Once | INTRAVENOUS | Status: AC | PRN
  Administered 2015-03-25: 20 mL via INTRAVENOUS

## 2015-04-01 ENCOUNTER — Other Ambulatory Visit (HOSPITAL_BASED_OUTPATIENT_CLINIC_OR_DEPARTMENT_OTHER): Payer: Medicaid Other

## 2015-04-01 ENCOUNTER — Ambulatory Visit (HOSPITAL_BASED_OUTPATIENT_CLINIC_OR_DEPARTMENT_OTHER): Payer: Medicaid Other

## 2015-04-01 VITALS — BP 112/80 | HR 80 | Temp 97.7°F

## 2015-04-01 DIAGNOSIS — D61818 Other pancytopenia: Secondary | ICD-10-CM

## 2015-04-01 DIAGNOSIS — E539 Vitamin B deficiency, unspecified: Secondary | ICD-10-CM

## 2015-04-01 DIAGNOSIS — D519 Vitamin B12 deficiency anemia, unspecified: Secondary | ICD-10-CM | POA: Diagnosis present

## 2015-04-01 DIAGNOSIS — G63 Polyneuropathy in diseases classified elsewhere: Secondary | ICD-10-CM

## 2015-04-01 DIAGNOSIS — G8220 Paraplegia, unspecified: Secondary | ICD-10-CM

## 2015-04-01 DIAGNOSIS — R29898 Other symptoms and signs involving the musculoskeletal system: Secondary | ICD-10-CM

## 2015-04-01 DIAGNOSIS — G379 Demyelinating disease of central nervous system, unspecified: Secondary | ICD-10-CM

## 2015-04-01 DIAGNOSIS — E538 Deficiency of other specified B group vitamins: Secondary | ICD-10-CM

## 2015-04-01 LAB — CBC WITH DIFFERENTIAL/PLATELET
BASO%: 0.2 % (ref 0.0–2.0)
Basophils Absolute: 0 10*3/uL (ref 0.0–0.1)
EOS%: 1.2 % (ref 0.0–7.0)
Eosinophils Absolute: 0.1 10*3/uL (ref 0.0–0.5)
HCT: 38.3 % (ref 34.8–46.6)
HGB: 12.4 g/dL (ref 11.6–15.9)
LYMPH%: 38.1 % (ref 14.0–49.7)
MCH: 27 pg (ref 25.1–34.0)
MCHC: 32.4 g/dL (ref 31.5–36.0)
MCV: 83.4 fL (ref 79.5–101.0)
MONO#: 0.4 10*3/uL (ref 0.1–0.9)
MONO%: 6.9 % (ref 0.0–14.0)
NEUT#: 3 10*3/uL (ref 1.5–6.5)
NEUT%: 53.6 % (ref 38.4–76.8)
Platelets: 299 10*3/uL (ref 145–400)
RBC: 4.59 10*6/uL (ref 3.70–5.45)
RDW: 19 % — ABNORMAL HIGH (ref 11.2–14.5)
WBC: 5.6 10*3/uL (ref 3.9–10.3)
lymph#: 2.2 10*3/uL (ref 0.9–3.3)
nRBC: 0 % (ref 0–0)

## 2015-04-01 MED ORDER — CYANOCOBALAMIN 1000 MCG/ML IJ SOLN
1000.0000 ug | Freq: Once | INTRAMUSCULAR | Status: AC
  Administered 2015-04-01: 1000 ug via INTRAMUSCULAR

## 2015-04-29 ENCOUNTER — Other Ambulatory Visit: Payer: Medicaid Other

## 2015-04-29 ENCOUNTER — Ambulatory Visit: Payer: Medicaid Other

## 2015-05-27 ENCOUNTER — Ambulatory Visit (HOSPITAL_BASED_OUTPATIENT_CLINIC_OR_DEPARTMENT_OTHER): Payer: Medicaid Other

## 2015-05-27 ENCOUNTER — Other Ambulatory Visit (HOSPITAL_BASED_OUTPATIENT_CLINIC_OR_DEPARTMENT_OTHER): Payer: Medicaid Other

## 2015-05-27 VITALS — BP 120/73 | HR 81 | Temp 98.6°F

## 2015-05-27 DIAGNOSIS — D61818 Other pancytopenia: Secondary | ICD-10-CM

## 2015-05-27 DIAGNOSIS — G8220 Paraplegia, unspecified: Secondary | ICD-10-CM

## 2015-05-27 DIAGNOSIS — D519 Vitamin B12 deficiency anemia, unspecified: Secondary | ICD-10-CM | POA: Diagnosis present

## 2015-05-27 DIAGNOSIS — R29898 Other symptoms and signs involving the musculoskeletal system: Secondary | ICD-10-CM

## 2015-05-27 DIAGNOSIS — E539 Vitamin B deficiency, unspecified: Secondary | ICD-10-CM

## 2015-05-27 DIAGNOSIS — E538 Deficiency of other specified B group vitamins: Secondary | ICD-10-CM

## 2015-05-27 DIAGNOSIS — G63 Polyneuropathy in diseases classified elsewhere: Secondary | ICD-10-CM

## 2015-05-27 DIAGNOSIS — G379 Demyelinating disease of central nervous system, unspecified: Secondary | ICD-10-CM

## 2015-05-27 LAB — CBC WITH DIFFERENTIAL/PLATELET
BASO%: 0.5 % (ref 0.0–2.0)
Basophils Absolute: 0 10*3/uL (ref 0.0–0.1)
EOS%: 2.7 % (ref 0.0–7.0)
Eosinophils Absolute: 0.2 10*3/uL (ref 0.0–0.5)
HCT: 39.2 % (ref 34.8–46.6)
HGB: 13.6 g/dL (ref 11.6–15.9)
LYMPH%: 40.7 % (ref 14.0–49.7)
MCH: 30 pg (ref 25.1–34.0)
MCHC: 34.7 g/dL (ref 31.5–36.0)
MCV: 86.3 fL (ref 79.5–101.0)
MONO#: 0.4 10*3/uL (ref 0.1–0.9)
MONO%: 6.1 % (ref 0.0–14.0)
NEUT#: 3.1 10*3/uL (ref 1.5–6.5)
NEUT%: 50 % (ref 38.4–76.8)
Platelets: 208 10*3/uL (ref 145–400)
RBC: 4.54 10*6/uL (ref 3.70–5.45)
RDW: 15.4 % — ABNORMAL HIGH (ref 11.2–14.5)
WBC: 6.2 10*3/uL (ref 3.9–10.3)
lymph#: 2.5 10*3/uL (ref 0.9–3.3)
nRBC: 0 % (ref 0–0)

## 2015-05-27 MED ORDER — CYANOCOBALAMIN 1000 MCG/ML IJ SOLN
1000.0000 ug | Freq: Once | INTRAMUSCULAR | Status: AC
  Administered 2015-05-27: 1000 ug via INTRAMUSCULAR

## 2015-06-24 ENCOUNTER — Ambulatory Visit (HOSPITAL_BASED_OUTPATIENT_CLINIC_OR_DEPARTMENT_OTHER): Payer: Medicaid Other

## 2015-06-24 ENCOUNTER — Other Ambulatory Visit (HOSPITAL_BASED_OUTPATIENT_CLINIC_OR_DEPARTMENT_OTHER): Payer: Medicaid Other

## 2015-06-24 VITALS — BP 115/76 | HR 58 | Temp 98.2°F | Resp 16

## 2015-06-24 DIAGNOSIS — E538 Deficiency of other specified B group vitamins: Secondary | ICD-10-CM | POA: Diagnosis present

## 2015-06-24 DIAGNOSIS — G8220 Paraplegia, unspecified: Secondary | ICD-10-CM

## 2015-06-24 DIAGNOSIS — D61818 Other pancytopenia: Secondary | ICD-10-CM

## 2015-06-24 DIAGNOSIS — G379 Demyelinating disease of central nervous system, unspecified: Secondary | ICD-10-CM

## 2015-06-24 DIAGNOSIS — E539 Vitamin B deficiency, unspecified: Secondary | ICD-10-CM

## 2015-06-24 DIAGNOSIS — G63 Polyneuropathy in diseases classified elsewhere: Secondary | ICD-10-CM

## 2015-06-24 DIAGNOSIS — R29898 Other symptoms and signs involving the musculoskeletal system: Secondary | ICD-10-CM

## 2015-06-24 LAB — CBC WITH DIFFERENTIAL/PLATELET
BASO%: 0.1 % (ref 0.0–2.0)
Basophils Absolute: 0 10*3/uL (ref 0.0–0.1)
EOS%: 1.5 % (ref 0.0–7.0)
Eosinophils Absolute: 0.1 10*3/uL (ref 0.0–0.5)
HCT: 39.5 % (ref 34.8–46.6)
HGB: 13 g/dL (ref 11.6–15.9)
LYMPH%: 32.4 % (ref 14.0–49.7)
MCH: 29.8 pg (ref 25.1–34.0)
MCHC: 32.9 g/dL (ref 31.5–36.0)
MCV: 90.3 fL (ref 79.5–101.0)
MONO#: 0.3 10*3/uL (ref 0.1–0.9)
MONO%: 5 % (ref 0.0–14.0)
NEUT#: 3.4 10*3/uL (ref 1.5–6.5)
NEUT%: 61 % (ref 38.4–76.8)
Platelets: 206 10*3/uL (ref 145–400)
RBC: 4.37 10*6/uL (ref 3.70–5.45)
RDW: 14.3 % (ref 11.2–14.5)
WBC: 5.6 10*3/uL (ref 3.9–10.3)
lymph#: 1.8 10*3/uL (ref 0.9–3.3)

## 2015-06-24 MED ORDER — CYANOCOBALAMIN 1000 MCG/ML IJ SOLN
1000.0000 ug | Freq: Once | INTRAMUSCULAR | Status: AC
  Administered 2015-06-24: 1000 ug via INTRAMUSCULAR

## 2015-06-24 NOTE — Patient Instructions (Signed)

## 2015-06-25 LAB — VITAMIN B12: Vitamin B12: 175 pg/mL — ABNORMAL LOW (ref 211–946)

## 2015-07-22 ENCOUNTER — Other Ambulatory Visit: Payer: Medicaid Other

## 2015-07-22 ENCOUNTER — Ambulatory Visit: Payer: Medicaid Other

## 2015-08-19 ENCOUNTER — Ambulatory Visit (HOSPITAL_BASED_OUTPATIENT_CLINIC_OR_DEPARTMENT_OTHER): Payer: Medicaid Other

## 2015-08-19 ENCOUNTER — Other Ambulatory Visit (HOSPITAL_BASED_OUTPATIENT_CLINIC_OR_DEPARTMENT_OTHER): Payer: Medicaid Other

## 2015-08-19 VITALS — BP 144/88 | HR 60 | Temp 98.6°F

## 2015-08-19 DIAGNOSIS — D61818 Other pancytopenia: Secondary | ICD-10-CM

## 2015-08-19 DIAGNOSIS — D519 Vitamin B12 deficiency anemia, unspecified: Secondary | ICD-10-CM | POA: Diagnosis present

## 2015-08-19 DIAGNOSIS — R29898 Other symptoms and signs involving the musculoskeletal system: Secondary | ICD-10-CM

## 2015-08-19 DIAGNOSIS — E538 Deficiency of other specified B group vitamins: Secondary | ICD-10-CM

## 2015-08-19 DIAGNOSIS — E539 Vitamin B deficiency, unspecified: Secondary | ICD-10-CM

## 2015-08-19 DIAGNOSIS — G63 Polyneuropathy in diseases classified elsewhere: Secondary | ICD-10-CM

## 2015-08-19 DIAGNOSIS — G379 Demyelinating disease of central nervous system, unspecified: Secondary | ICD-10-CM

## 2015-08-19 DIAGNOSIS — G8220 Paraplegia, unspecified: Secondary | ICD-10-CM

## 2015-08-19 LAB — CBC WITH DIFFERENTIAL/PLATELET
BASO%: 0.4 % (ref 0.0–2.0)
Basophils Absolute: 0 10*3/uL (ref 0.0–0.1)
EOS%: 1.7 % (ref 0.0–7.0)
Eosinophils Absolute: 0.1 10*3/uL (ref 0.0–0.5)
HCT: 39.7 % (ref 34.8–46.6)
HGB: 13.5 g/dL (ref 11.6–15.9)
LYMPH%: 40.2 % (ref 14.0–49.7)
MCH: 30.1 pg (ref 25.1–34.0)
MCHC: 34 g/dL (ref 31.5–36.0)
MCV: 88.6 fL (ref 79.5–101.0)
MONO#: 0.3 10*3/uL (ref 0.1–0.9)
MONO%: 6.3 % (ref 0.0–14.0)
NEUT#: 2.7 10*3/uL (ref 1.5–6.5)
NEUT%: 51.4 % (ref 38.4–76.8)
Platelets: 185 10*3/uL (ref 145–400)
RBC: 4.48 10*6/uL (ref 3.70–5.45)
RDW: 13.1 % (ref 11.2–14.5)
WBC: 5.3 10*3/uL (ref 3.9–10.3)
lymph#: 2.1 10*3/uL (ref 0.9–3.3)
nRBC: 0 % (ref 0–0)

## 2015-08-19 MED ORDER — CYANOCOBALAMIN 1000 MCG/ML IJ SOLN
1000.0000 ug | Freq: Once | INTRAMUSCULAR | Status: AC
  Administered 2015-08-19: 1000 ug via INTRAMUSCULAR

## 2015-08-19 NOTE — Patient Instructions (Signed)

## 2015-09-16 ENCOUNTER — Ambulatory Visit (HOSPITAL_BASED_OUTPATIENT_CLINIC_OR_DEPARTMENT_OTHER): Payer: Medicaid Other

## 2015-09-16 ENCOUNTER — Other Ambulatory Visit (HOSPITAL_BASED_OUTPATIENT_CLINIC_OR_DEPARTMENT_OTHER): Payer: Medicaid Other

## 2015-09-16 VITALS — BP 119/85 | HR 67 | Temp 98.5°F | Resp 18

## 2015-09-16 DIAGNOSIS — E539 Vitamin B deficiency, unspecified: Secondary | ICD-10-CM

## 2015-09-16 DIAGNOSIS — G379 Demyelinating disease of central nervous system, unspecified: Secondary | ICD-10-CM

## 2015-09-16 DIAGNOSIS — G63 Polyneuropathy in diseases classified elsewhere: Secondary | ICD-10-CM

## 2015-09-16 DIAGNOSIS — E538 Deficiency of other specified B group vitamins: Secondary | ICD-10-CM | POA: Diagnosis present

## 2015-09-16 DIAGNOSIS — G8220 Paraplegia, unspecified: Secondary | ICD-10-CM

## 2015-09-16 DIAGNOSIS — D61818 Other pancytopenia: Secondary | ICD-10-CM

## 2015-09-16 DIAGNOSIS — R29898 Other symptoms and signs involving the musculoskeletal system: Secondary | ICD-10-CM

## 2015-09-16 LAB — CBC WITH DIFFERENTIAL/PLATELET
BASO%: 0 % (ref 0.0–2.0)
Basophils Absolute: 0 10*3/uL (ref 0.0–0.1)
EOS%: 2.3 % (ref 0.0–7.0)
Eosinophils Absolute: 0.1 10*3/uL (ref 0.0–0.5)
HCT: 40 % (ref 34.8–46.6)
HGB: 13.9 g/dL (ref 11.6–15.9)
LYMPH%: 44.8 % (ref 14.0–49.7)
MCH: 30.4 pg (ref 25.1–34.0)
MCHC: 34.8 g/dL (ref 31.5–36.0)
MCV: 87.5 fL (ref 79.5–101.0)
MONO#: 0.3 10*3/uL (ref 0.1–0.9)
MONO%: 6.9 % (ref 0.0–14.0)
NEUT#: 2.2 10*3/uL (ref 1.5–6.5)
NEUT%: 46 % (ref 38.4–76.8)
Platelets: 196 10*3/uL (ref 145–400)
RBC: 4.57 10*6/uL (ref 3.70–5.45)
RDW: 13.1 % (ref 11.2–14.5)
WBC: 4.8 10*3/uL (ref 3.9–10.3)
lymph#: 2.1 10*3/uL (ref 0.9–3.3)
nRBC: 0 % (ref 0–0)

## 2015-09-16 MED ORDER — CYANOCOBALAMIN 1000 MCG/ML IJ SOLN
1000.0000 ug | Freq: Once | INTRAMUSCULAR | Status: AC
  Administered 2015-09-16: 1000 ug via INTRAMUSCULAR

## 2015-09-16 NOTE — Patient Instructions (Signed)

## 2015-09-20 ENCOUNTER — Other Ambulatory Visit: Payer: Self-pay | Admitting: Oncology

## 2015-09-20 DIAGNOSIS — E538 Deficiency of other specified B group vitamins: Secondary | ICD-10-CM

## 2015-10-14 ENCOUNTER — Other Ambulatory Visit (HOSPITAL_BASED_OUTPATIENT_CLINIC_OR_DEPARTMENT_OTHER): Payer: Medicaid Other

## 2015-10-14 ENCOUNTER — Ambulatory Visit (HOSPITAL_BASED_OUTPATIENT_CLINIC_OR_DEPARTMENT_OTHER): Payer: Medicaid Other

## 2015-10-14 VITALS — BP 123/88 | HR 76 | Temp 98.2°F | Resp 20

## 2015-10-14 DIAGNOSIS — R29898 Other symptoms and signs involving the musculoskeletal system: Secondary | ICD-10-CM

## 2015-10-14 DIAGNOSIS — G63 Polyneuropathy in diseases classified elsewhere: Secondary | ICD-10-CM

## 2015-10-14 DIAGNOSIS — D61818 Other pancytopenia: Secondary | ICD-10-CM

## 2015-10-14 DIAGNOSIS — E538 Deficiency of other specified B group vitamins: Secondary | ICD-10-CM

## 2015-10-14 DIAGNOSIS — G379 Demyelinating disease of central nervous system, unspecified: Secondary | ICD-10-CM

## 2015-10-14 DIAGNOSIS — E539 Vitamin B deficiency, unspecified: Secondary | ICD-10-CM

## 2015-10-14 DIAGNOSIS — G8220 Paraplegia, unspecified: Secondary | ICD-10-CM

## 2015-10-14 LAB — CBC WITH DIFFERENTIAL/PLATELET
BASO%: 0.1 % (ref 0.0–2.0)
Basophils Absolute: 0 10*3/uL (ref 0.0–0.1)
EOS%: 1.1 % (ref 0.0–7.0)
Eosinophils Absolute: 0.1 10*3/uL (ref 0.0–0.5)
HCT: 38.3 % (ref 34.8–46.6)
HGB: 12.8 g/dL (ref 11.6–15.9)
LYMPH%: 42.1 % (ref 14.0–49.7)
MCH: 29.8 pg (ref 25.1–34.0)
MCHC: 33.4 g/dL (ref 31.5–36.0)
MCV: 89.3 fL (ref 79.5–101.0)
MONO#: 0.2 10*3/uL (ref 0.1–0.9)
MONO%: 4.5 % (ref 0.0–14.0)
NEUT#: 2.8 10*3/uL (ref 1.5–6.5)
NEUT%: 52.2 % (ref 38.4–76.8)
Platelets: 229 10*3/uL (ref 145–400)
RBC: 4.29 10*6/uL (ref 3.70–5.45)
RDW: 13.4 % (ref 11.2–14.5)
WBC: 5.3 10*3/uL (ref 3.9–10.3)
lymph#: 2.2 10*3/uL (ref 0.9–3.3)

## 2015-10-14 MED ORDER — CYANOCOBALAMIN 1000 MCG/ML IJ SOLN
1000.0000 ug | Freq: Once | INTRAMUSCULAR | Status: AC
  Administered 2015-10-14: 1000 ug via INTRAMUSCULAR

## 2015-10-14 NOTE — Patient Instructions (Signed)

## 2015-11-11 ENCOUNTER — Other Ambulatory Visit (HOSPITAL_BASED_OUTPATIENT_CLINIC_OR_DEPARTMENT_OTHER): Payer: Medicare Other

## 2015-11-11 ENCOUNTER — Ambulatory Visit (HOSPITAL_BASED_OUTPATIENT_CLINIC_OR_DEPARTMENT_OTHER): Payer: Medicare Other

## 2015-11-11 VITALS — BP 120/77 | HR 75 | Temp 98.6°F | Resp 18

## 2015-11-11 DIAGNOSIS — D61818 Other pancytopenia: Secondary | ICD-10-CM

## 2015-11-11 DIAGNOSIS — E539 Vitamin B deficiency, unspecified: Secondary | ICD-10-CM

## 2015-11-11 DIAGNOSIS — G63 Polyneuropathy in diseases classified elsewhere: Secondary | ICD-10-CM

## 2015-11-11 DIAGNOSIS — R29898 Other symptoms and signs involving the musculoskeletal system: Secondary | ICD-10-CM

## 2015-11-11 DIAGNOSIS — G379 Demyelinating disease of central nervous system, unspecified: Secondary | ICD-10-CM

## 2015-11-11 DIAGNOSIS — G8220 Paraplegia, unspecified: Secondary | ICD-10-CM

## 2015-11-11 DIAGNOSIS — E538 Deficiency of other specified B group vitamins: Secondary | ICD-10-CM | POA: Diagnosis present

## 2015-11-11 LAB — CBC WITH DIFFERENTIAL/PLATELET
BASO%: 0.2 % (ref 0.0–2.0)
Basophils Absolute: 0 10*3/uL (ref 0.0–0.1)
EOS%: 0.5 % (ref 0.0–7.0)
Eosinophils Absolute: 0 10*3/uL (ref 0.0–0.5)
HCT: 36.9 % (ref 34.8–46.6)
HGB: 12.4 g/dL (ref 11.6–15.9)
LYMPH%: 36.8 % (ref 14.0–49.7)
MCH: 29.2 pg (ref 25.1–34.0)
MCHC: 33.6 g/dL (ref 31.5–36.0)
MCV: 87 fL (ref 79.5–101.0)
MONO#: 0.3 10*3/uL (ref 0.1–0.9)
MONO%: 4.2 % (ref 0.0–14.0)
NEUT#: 3.5 10*3/uL (ref 1.5–6.5)
NEUT%: 58.3 % (ref 38.4–76.8)
Platelets: 228 10*3/uL (ref 145–400)
RBC: 4.24 10*6/uL (ref 3.70–5.45)
RDW: 13.2 % (ref 11.2–14.5)
WBC: 6 10*3/uL (ref 3.9–10.3)
lymph#: 2.2 10*3/uL (ref 0.9–3.3)
nRBC: 0 % (ref 0–0)

## 2015-11-11 MED ORDER — CYANOCOBALAMIN 1000 MCG/ML IJ SOLN
1000.0000 ug | Freq: Once | INTRAMUSCULAR | Status: AC
  Administered 2015-11-11: 1000 ug via INTRAMUSCULAR

## 2015-11-11 NOTE — Patient Instructions (Signed)

## 2015-12-03 ENCOUNTER — Ambulatory Visit: Payer: Self-pay | Admitting: Allergy & Immunology

## 2015-12-08 ENCOUNTER — Other Ambulatory Visit: Payer: Self-pay | Admitting: *Deleted

## 2015-12-08 DIAGNOSIS — E538 Deficiency of other specified B group vitamins: Secondary | ICD-10-CM

## 2015-12-09 ENCOUNTER — Other Ambulatory Visit (HOSPITAL_BASED_OUTPATIENT_CLINIC_OR_DEPARTMENT_OTHER): Payer: Medicare Other

## 2015-12-09 ENCOUNTER — Ambulatory Visit (HOSPITAL_BASED_OUTPATIENT_CLINIC_OR_DEPARTMENT_OTHER): Payer: Medicare Other | Admitting: Nurse Practitioner

## 2015-12-09 VITALS — BP 121/78 | HR 86 | Temp 98.6°F | Resp 16

## 2015-12-09 DIAGNOSIS — E611 Iron deficiency: Secondary | ICD-10-CM | POA: Diagnosis present

## 2015-12-09 DIAGNOSIS — E538 Deficiency of other specified B group vitamins: Secondary | ICD-10-CM

## 2015-12-09 DIAGNOSIS — E539 Vitamin B deficiency, unspecified: Secondary | ICD-10-CM

## 2015-12-09 DIAGNOSIS — D61818 Other pancytopenia: Secondary | ICD-10-CM

## 2015-12-09 DIAGNOSIS — G63 Polyneuropathy in diseases classified elsewhere: Secondary | ICD-10-CM

## 2015-12-09 DIAGNOSIS — G8220 Paraplegia, unspecified: Secondary | ICD-10-CM

## 2015-12-09 DIAGNOSIS — R29898 Other symptoms and signs involving the musculoskeletal system: Secondary | ICD-10-CM

## 2015-12-09 LAB — CBC WITH DIFFERENTIAL/PLATELET
BASO%: 0.2 % (ref 0.0–2.0)
Basophils Absolute: 0 10*3/uL (ref 0.0–0.1)
EOS%: 1.2 % (ref 0.0–7.0)
Eosinophils Absolute: 0.1 10*3/uL (ref 0.0–0.5)
HCT: 38.5 % (ref 34.8–46.6)
HGB: 12.6 g/dL (ref 11.6–15.9)
LYMPH%: 38.5 % (ref 14.0–49.7)
MCH: 28.4 pg (ref 25.1–34.0)
MCHC: 32.7 g/dL (ref 31.5–36.0)
MCV: 87.1 fL (ref 79.5–101.0)
MONO#: 0.1 10*3/uL (ref 0.1–0.9)
MONO%: 1.6 % (ref 0.0–14.0)
NEUT#: 3.8 10*3/uL (ref 1.5–6.5)
NEUT%: 58.5 % (ref 38.4–76.8)
Platelets: 256 10*3/uL (ref 145–400)
RBC: 4.42 10*6/uL (ref 3.70–5.45)
RDW: 13.7 % (ref 11.2–14.5)
WBC: 6.5 10*3/uL (ref 3.9–10.3)
lymph#: 2.5 10*3/uL (ref 0.9–3.3)

## 2015-12-09 LAB — FERRITIN: Ferritin: 9 ng/ml (ref 9–269)

## 2015-12-09 MED ORDER — CYANOCOBALAMIN 1000 MCG/ML IJ SOLN
1000.0000 ug | Freq: Once | INTRAMUSCULAR | Status: AC
  Administered 2015-12-09: 1000 ug via INTRAMUSCULAR

## 2015-12-09 NOTE — Patient Instructions (Signed)

## 2015-12-10 ENCOUNTER — Other Ambulatory Visit: Payer: Self-pay | Admitting: Oncology

## 2015-12-10 LAB — VITAMIN B12: Vitamin B12: 203 pg/mL — ABNORMAL LOW (ref 211–946)

## 2016-01-06 ENCOUNTER — Ambulatory Visit: Payer: Medicare Other

## 2016-01-06 ENCOUNTER — Other Ambulatory Visit (HOSPITAL_BASED_OUTPATIENT_CLINIC_OR_DEPARTMENT_OTHER): Payer: Medicare Other

## 2016-01-06 ENCOUNTER — Ambulatory Visit: Payer: Medicare Other | Admitting: Oncology

## 2016-01-06 DIAGNOSIS — D61818 Other pancytopenia: Secondary | ICD-10-CM

## 2016-01-06 DIAGNOSIS — E539 Vitamin B deficiency, unspecified: Secondary | ICD-10-CM

## 2016-01-06 DIAGNOSIS — G63 Polyneuropathy in diseases classified elsewhere: Secondary | ICD-10-CM

## 2016-01-06 DIAGNOSIS — G379 Demyelinating disease of central nervous system, unspecified: Secondary | ICD-10-CM

## 2016-01-06 LAB — CBC WITH DIFFERENTIAL/PLATELET
BASO%: 0.2 % (ref 0.0–2.0)
Basophils Absolute: 0 10*3/uL (ref 0.0–0.1)
EOS%: 1.4 % (ref 0.0–7.0)
Eosinophils Absolute: 0.1 10*3/uL (ref 0.0–0.5)
HCT: 38 % (ref 34.8–46.6)
HGB: 12.7 g/dL (ref 11.6–15.9)
LYMPH%: 32.6 % (ref 14.0–49.7)
MCH: 28.9 pg (ref 25.1–34.0)
MCHC: 33.4 g/dL (ref 31.5–36.0)
MCV: 86.6 fL (ref 79.5–101.0)
MONO#: 0.4 10*3/uL (ref 0.1–0.9)
MONO%: 7 % (ref 0.0–14.0)
NEUT#: 3.7 10*3/uL (ref 1.5–6.5)
NEUT%: 58.8 % (ref 38.4–76.8)
Platelets: 227 10*3/uL (ref 145–400)
RBC: 4.39 10*6/uL (ref 3.70–5.45)
RDW: 13.8 % (ref 11.2–14.5)
WBC: 6.3 10*3/uL (ref 3.9–10.3)
lymph#: 2.1 10*3/uL (ref 0.9–3.3)
nRBC: 0 % (ref 0–0)

## 2016-01-20 ENCOUNTER — Ambulatory Visit (HOSPITAL_BASED_OUTPATIENT_CLINIC_OR_DEPARTMENT_OTHER): Payer: Medicare Other

## 2016-01-20 ENCOUNTER — Other Ambulatory Visit: Payer: Self-pay

## 2016-01-20 ENCOUNTER — Ambulatory Visit (HOSPITAL_BASED_OUTPATIENT_CLINIC_OR_DEPARTMENT_OTHER): Payer: Medicare Other | Admitting: Oncology

## 2016-01-20 ENCOUNTER — Other Ambulatory Visit (HOSPITAL_BASED_OUTPATIENT_CLINIC_OR_DEPARTMENT_OTHER): Payer: Medicare Other

## 2016-01-20 VITALS — BP 122/83 | HR 72 | Temp 98.4°F | Resp 18 | Ht 63.0 in | Wt 224.6 lb

## 2016-01-20 DIAGNOSIS — E538 Deficiency of other specified B group vitamins: Secondary | ICD-10-CM

## 2016-01-20 DIAGNOSIS — E539 Vitamin B deficiency, unspecified: Secondary | ICD-10-CM

## 2016-01-20 DIAGNOSIS — D61818 Other pancytopenia: Secondary | ICD-10-CM

## 2016-01-20 DIAGNOSIS — F3340 Major depressive disorder, recurrent, in remission, unspecified: Secondary | ICD-10-CM

## 2016-01-20 DIAGNOSIS — E611 Iron deficiency: Secondary | ICD-10-CM | POA: Diagnosis not present

## 2016-01-20 DIAGNOSIS — G320 Subacute combined degeneration of spinal cord in diseases classified elsewhere: Secondary | ICD-10-CM | POA: Diagnosis not present

## 2016-01-20 DIAGNOSIS — G63 Polyneuropathy in diseases classified elsewhere: Principal | ICD-10-CM

## 2016-01-20 DIAGNOSIS — E43 Unspecified severe protein-calorie malnutrition: Secondary | ICD-10-CM

## 2016-01-20 DIAGNOSIS — G8220 Paraplegia, unspecified: Secondary | ICD-10-CM

## 2016-01-20 DIAGNOSIS — R29898 Other symptoms and signs involving the musculoskeletal system: Secondary | ICD-10-CM

## 2016-01-20 LAB — CBC WITH DIFFERENTIAL/PLATELET
BASO%: 0 % (ref 0.0–2.0)
Basophils Absolute: 0 10*3/uL (ref 0.0–0.1)
EOS%: 2 % (ref 0.0–7.0)
Eosinophils Absolute: 0.1 10*3/uL (ref 0.0–0.5)
HCT: 37.1 % (ref 34.8–46.6)
HGB: 12.3 g/dL (ref 11.6–15.9)
LYMPH%: 43.2 % (ref 14.0–49.7)
MCH: 28.3 pg (ref 25.1–34.0)
MCHC: 33.2 g/dL (ref 31.5–36.0)
MCV: 85.5 fL (ref 79.5–101.0)
MONO#: 0.2 10*3/uL (ref 0.1–0.9)
MONO%: 3.7 % (ref 0.0–14.0)
NEUT#: 3.3 10*3/uL (ref 1.5–6.5)
NEUT%: 51.1 % (ref 38.4–76.8)
Platelets: 249 10*3/uL (ref 145–400)
RBC: 4.34 10*6/uL (ref 3.70–5.45)
RDW: 14.1 % (ref 11.2–14.5)
WBC: 6.5 10*3/uL (ref 3.9–10.3)
lymph#: 2.8 10*3/uL (ref 0.9–3.3)

## 2016-01-20 MED ORDER — BUPROPION HCL ER (XL) 150 MG PO TB24: 150.0000 mg | ORAL_TABLET | Freq: Every day | ORAL | 4 refills | Status: DC

## 2016-01-20 MED ORDER — ALBUTEROL SULFATE HFA 108 (90 BASE) MCG/ACT IN AERS: 2.0000 | INHALATION_SPRAY | Freq: Four times a day (QID) | RESPIRATORY_TRACT | 12 refills | Status: DC | PRN

## 2016-01-20 MED ORDER — CYANOCOBALAMIN 1000 MCG/ML IJ SOLN
1000.0000 ug | Freq: Once | INTRAMUSCULAR | Status: AC
  Administered 2016-01-20: 1000 ug via INTRAMUSCULAR

## 2016-01-20 NOTE — Progress Notes (Signed)
ID: Jill Summers OB: May 09, 1985  MR#: [id number]  WUJ#:[id number]#:[id number]  PCP: Jill PaulaFUNCHES, JOSALYN C, MD GYN:   SU:  OTHER MD:  CHIEF COMPLAINT: vit B12 anemia, subacute combined degeneration, iron deficiency  CURRENT THERAPY:  vit B12 supplementation, Feraheme as needed  HISTORY OF PRESENT ILLNESS: From the earlier summary:  Jill Summers is a right handed female with history of depression and bronchial asthma as well as recent cellulitis of the umbilicus and treated with Bactrim in September 2015 as well as recent MRSA PCR screen +2 months ago as well as previously untreated B-12 deficiency. Patient lives with children independent prior to admission and her mother has been assisting with care of the children recently. Admitted 12/16/2013 with bouts of dizziness over the past 2 weeks as well as lower strandy weakness and numbness. She denied any falls. No change in bowel or bladder. Cranial CT scan negative. MRI of thoracic lumbar and cervical spine showed multiple bilateral thoracic cord lesions in the lateral white matter most compatible with demyelinating disease of question etiology and workup ongoing as per neurology services and felt cord lesions most likely manifestations of B-12 deficiency. Partial visualization of lower cervical cord shows white matter lesions with full cervical spine films pending. MRI of the brain 12/18/2013 showed less than 10 nonspecific subcentimeter supratentorial white matter T2 hyperintensities in a atypical distribution for classic demyelination. Found to have severe pancytopenia with WBC 1.4, ANC 0.4, hemoglobin 5.5, MCV 101.9, platelets 37,000. Hematology service consulted (Dr. Darnelle Summers) She was transfused. Etiology is secondary to B-12 deficiency and currently maintained on supplementation with plan to follow-up with hematology services mostly for B-12 injections   INTERVAL HISTORY: Jill Summers returns today for follow up of her vitamin B12 deficiency. She is  receiving B12 supplementation monthly with no complications. She tells me her subacute combined degenerative symptoms are better, although she still has problems with balance.  She is having regular periods, not more heavy than usual. She denies any other bleeding and particularly any black or tarry stools, blood in her stools, or change in bowel habits  REVIEW OF SYSTEMS: A detailed review of systems today was otherwise stable .  PAST MEDICAL HISTORY: Past Medical History:  Diagnosis Date  . Adopted   . Asthma    since baby  . Bacterial vaginosis   . Chlamydia   . Depression    At age 30  . MRSA infection   . Obesity   . Urinary tract infection     PAST SURGICAL HISTORY: Past Surgical History:  Procedure Laterality Date  . CESAREAN SECTION    . CESAREAN SECTION WITH BILATERAL TUBAL LIGATION      FAMILY HISTORY Family History  Problem Relation Age of Onset  . Adopted: Yes  Mother - hypertension, Father - diabetes  GYNECOLOGIC HISTORY:  Patient has regular menstrual periods. She is status post bilateral tubal ligation. Menarche age 30, she is GX P2    SOCIAL HISTORY: (Updated December 2017) Disabled due to asthma; lives with 625 y/o son Jill Summers. Mother Jill Summers lives next door--she is a retired [employer] Summers-- with patient's 10 y/o child, Jill Summers.  ADVANCED DIRECTIVES: not in place    HEALTH MAINTENANCE: Social History  Substance Use Topics  . Smoking status: Never Smoker  . Smokeless tobacco: Never Used  . Alcohol use No    Allergies  Allergen Reactions  . Peanut-Containing Drug Products Anaphylaxis, Hives and Swelling    *Throat swelling*  . Shellfish Allergy Anaphylaxis, Hives and  Swelling    Throat swelling    Current Outpatient Prescriptions  Medication Sig Dispense Refill  . albuterol (PROVENTIL HFA;VENTOLIN HFA) 108 (90 Base) MCG/ACT inhaler Inhale 2 puffs into the lungs every 6 (six) hours as needed for wheezing or shortness of breath. For asthma 1  Inhaler 12  . buPROPion (WELLBUTRIN XL) 150 MG 24 hr tablet Take 1 tablet (150 mg total) by mouth daily. 90 tablet 4  . cyanocobalamin (,VITAMIN B-12,) 1000 MCG/ML injection Inject 1 mL (1,000 mcg total) into the muscle every 30 (thirty) days. 1 mL 1   No current facility-administered medications for this visit.     OBJECTIVE: Young African-American woman Who appears well Vitals:   01/20/16 1438  BP: 122/83  Pulse: 72  Resp: 18  Temp: 98.4 F (36.9 Summers)     Body mass index is 39.79 kg/m.   ROS  Physical Exam   ECOG FS:1 - Symptomatic but completely ambulatory  Sclerae unicteric, pupils round and equal Oropharynx clear and moist-- no thrush or other lesions No cervical or supraclavicular adenopathy Lungs no rales or rhonchi Heart regular rate and rhythm Abd soft, nontender, positive bowel sounds MSK no focal spinal tenderness, no upper extremity lymphedema Neuro: nonfocal, well oriented, appropriate affect Breasts: Deferred   LAB RESULTS:  CBC    Component Value Date/Time   WBC 6.5 01/20/2016 1404   WBC 4.0 12/26/2013 0430   RBC 4.34 01/20/2016 1404   RBC 2.74 (L) 12/26/2013 0430   HGB 12.3 01/20/2016 1404   HCT 37.1 01/20/2016 1404   PLT 249 01/20/2016 1404   MCV 85.5 01/20/2016 1404   MCH 28.3 01/20/2016 1404   MCH 33.2 12/26/2013 0430   MCHC 33.2 01/20/2016 1404   MCHC 32.4 12/26/2013 0430   RDW 14.1 01/20/2016 1404   LYMPHSABS 2.8 01/20/2016 1404   MONOABS 0.2 01/20/2016 1404   EOSABS 0.1 01/20/2016 1404   BASOSABS 0.0 01/20/2016 1404   CMP     Component Value Date/Time   NA 140 02/04/2015 1049   K 3.9 02/04/2015 1049   CL 106 12/24/2013 0718   CO2 23 02/04/2015 1049   GLUCOSE 98 02/04/2015 1049   BUN 9.8 02/04/2015 1049   CREATININE 0.9 02/04/2015 1049   CALCIUM 8.8 02/04/2015 1049   PROT 6.9 02/04/2015 1049   ALBUMIN 3.4 (L) 02/04/2015 1049   AST 13 02/04/2015 1049   ALT <9 02/04/2015 1049   ALKPHOS 53 02/04/2015 1049   BILITOT 0.47  02/04/2015 1049   GFRNONAA >90 12/24/2013 0718   GFRAA >90 12/24/2013 0718        Component Value Date/Time   COLORURINE AMBER (A) 12/16/2013 0707   APPEARANCEUR HAZY (A) 12/16/2013 0707   LABSPEC 1.025 12/16/2013 0707   PHURINE 5.0 12/16/2013 0707   GLUCOSEU NEGATIVE 12/16/2013 0707   HGBUR LARGE (A) 12/16/2013 0707   BILIRUBINUR SMALL (A) 12/16/2013 0707   KETONESUR NEGATIVE 12/16/2013 0707   PROTEINUR NEGATIVE 12/16/2013 0707   UROBILINOGEN >8.0 (H) 12/16/2013 0707   NITRITE NEGATIVE 12/16/2013 0707   LEUKOCYTESUR SMALL (A) 12/16/2013 0707   Iron/TIBC/Ferritin/ %Sat    Component Value Date/Time   IRON 113 12/15/2013 2108   TIBC 207 (L) 12/15/2013 2108   FERRITIN 9 12/09/2015 1037   IRONPCTSAT 55 12/15/2013 2108    STUDIES: CLINICAL DATA:  Demyelinating disease. Lower extremity weakness. Follow-up abnormal brain MRI from 2015.  EXAM: MRI HEAD WITHOUT AND WITH CONTRAST  TECHNIQUE: Multiplanar, multiecho pulse sequences of the brain and surrounding  structures were obtained without and with intravenous contrast.  CONTRAST:  20mL MULTIHANCE GADOBENATE DIMEGLUMINE 529 MG/ML IV SOLN  COMPARISON:  12/18/2013  FINDINGS: There is no evidence of acute infarct, intracranial hemorrhage, mass, midline shift, or extra-axial fluid collection. Ventricles and sulci are normal. Less than 10 punctate foci of T2 hyperintensity are again seen in the cerebral white matter, unchanged and predominantly involving the subcortical white matter of the frontal lobes.  Scattered dilated perivascular spaces are again noted, most prominently in the right basal ganglia. No abnormal enhancement is identified.  Orbits are unremarkable. Minimal frontal and ethmoid sinus mucosal thickening is noted. The mastoid air cells are clear. Major intracranial vascular flow voids are preserved.  IMPRESSION: 1. No acute intracranial abnormality. 2. Minimal cerebral white matter T2 signal  abnormality, unchanged and nonspecific. No lesions specifically suggestive of demyelinating disease.   Electronically Signed   By: Sebastian AcheAllen  Grady M.D.   On: 03/25/2015 11:01     ASSESSMENT: [age] y.o. [place] woman presenting 11/15/2013 with inability to walk, fever and pancytopenia, found to be severely B-12 deficient  (1) B-12 deficiency: will need lifelong replacement; as patient does not have PCP this can be done through our office (a) will receive montlhy  (2) subacute combined degeneration - Stable  (3) iron deficiency: ferritin level on 02/04/15 was 6. Feraheme given on 03/11/15 and 03/18/15.   (a) Ariton level less than 10 on 01/20/2016. Feraheme pending  PLAN:  Sonny MastersCandace is very stable as far as her B-12 deficiency problems are concerned. I do not know if her subacute combined degeneration symptoms will improve beyond her current status but she is at least not getting worse in that regard.  She understands that her primary care physician probably could give her the B-12 shots just as well as we, but she prefers to continue to receive those here and I am glad to accommodate that.  We checked a ferritin on the day of the visit. The results were not available until after she had left. They do show iron deficiency. I am sending her letter asking her to tell us when she would like to receive Feraheme infusion. She likely will continue to need these in an intermittent basis until she becomes postmenopausal  Otherwise she will return to see me in October of next year. She knows to call for any problems that may develop before her next visit here.     Lowella DellMAGRINAT,GUSTAV C, MD 01/22/2016 12:16 PM

## 2016-01-20 NOTE — Patient Instructions (Signed)

## 2016-01-21 LAB — VITAMIN B12: Vitamin B12: 243 pg/mL (ref 232–1245)

## 2016-01-22 ENCOUNTER — Encounter: Payer: Self-pay | Admitting: Oncology

## 2016-02-14 ENCOUNTER — Telehealth: Payer: Self-pay | Admitting: Oncology

## 2016-02-14 NOTE — Telephone Encounter (Signed)
lvm to inform pt of added appts to 1/18 and 1/25 schedule per LOS

## 2016-02-17 ENCOUNTER — Ambulatory Visit: Payer: Medicare Other

## 2016-02-24 ENCOUNTER — Other Ambulatory Visit: Payer: Self-pay | Admitting: Oncology

## 2016-02-24 ENCOUNTER — Other Ambulatory Visit: Payer: Self-pay | Admitting: *Deleted

## 2016-02-24 ENCOUNTER — Ambulatory Visit (HOSPITAL_BASED_OUTPATIENT_CLINIC_OR_DEPARTMENT_OTHER): Payer: Medicare Other

## 2016-02-24 VITALS — BP 126/80 | HR 72 | Temp 98.9°F | Resp 18

## 2016-02-24 DIAGNOSIS — E611 Iron deficiency: Secondary | ICD-10-CM | POA: Diagnosis not present

## 2016-02-24 DIAGNOSIS — D61818 Other pancytopenia: Secondary | ICD-10-CM

## 2016-02-24 DIAGNOSIS — E538 Deficiency of other specified B group vitamins: Secondary | ICD-10-CM

## 2016-02-24 DIAGNOSIS — D509 Iron deficiency anemia, unspecified: Secondary | ICD-10-CM

## 2016-02-24 DIAGNOSIS — R29898 Other symptoms and signs involving the musculoskeletal system: Secondary | ICD-10-CM

## 2016-02-24 DIAGNOSIS — E539 Vitamin B deficiency, unspecified: Secondary | ICD-10-CM

## 2016-02-24 DIAGNOSIS — G8220 Paraplegia, unspecified: Secondary | ICD-10-CM

## 2016-02-24 DIAGNOSIS — G63 Polyneuropathy in diseases classified elsewhere: Secondary | ICD-10-CM

## 2016-02-24 MED ORDER — SODIUM CHLORIDE 0.9 % IV SOLN
510.0000 mg | Freq: Once | INTRAVENOUS | Status: AC
  Administered 2016-02-24: 510 mg via INTRAVENOUS
  Filled 2016-02-24: qty 17

## 2016-02-24 MED ORDER — CYANOCOBALAMIN 1000 MCG/ML IJ SOLN
1000.0000 ug | Freq: Once | INTRAMUSCULAR | Status: AC
  Administered 2016-02-24: 1000 ug via INTRAMUSCULAR

## 2016-02-24 MED ORDER — CYANOCOBALAMIN 1000 MCG/ML IJ SOLN
INTRAMUSCULAR | Status: AC
Start: 2016-02-24 — End: 2016-02-24
  Filled 2016-02-24: qty 1

## 2016-02-24 MED ORDER — SODIUM CHLORIDE 0.9 % IV SOLN
Freq: Once | INTRAVENOUS | Status: AC
  Administered 2016-02-24: 10:00:00 via INTRAVENOUS

## 2016-02-24 NOTE — Patient Instructions (Addendum)
Ferumoxytol injection What is this medicine? FERUMOXYTOL is an iron complex. Iron is used to make healthy red blood cells, which carry oxygen and nutrients throughout the body. This medicine is used to treat iron deficiency anemia in people with chronic kidney disease. COMMON BRAND NAME(S): Feraheme What should I tell my health care provider before I take this medicine? They need to know if you have any of these conditions: -anemia not caused by low iron levels -high levels of iron in the blood -magnetic resonance imaging (MRI) test scheduled -an unusual or allergic reaction to iron, other medicines, foods, dyes, or preservatives -pregnant or trying to get pregnant -breast-feeding How should I use this medicine? This medicine is for injection into a vein. It is given by a health care professional in a hospital or clinic setting. Talk to your pediatrician regarding the use of this medicine in children. Special care may be needed. What if I miss a dose? It is important not to miss your dose. Call your doctor or health care professional if you are unable to keep an appointment. What may interact with this medicine? This medicine may interact with the following medications: -other iron products What should I watch for while using this medicine? Visit your doctor or healthcare professional regularly. Tell your doctor or healthcare professional if your symptoms do not start to get better or if they get worse. You may need blood work done while you are taking this medicine. You may need to follow a special diet. Talk to your doctor. Foods that contain iron include: whole grains/cereals, dried fruits, beans, or peas, leafy green vegetables, and organ meats (liver, kidney). What side effects may I notice from receiving this medicine? Side effects that you should report to your doctor or health care professional as soon as possible: -allergic reactions like skin rash, itching or hives, swelling of the  face, lips, or tongue -breathing problems -changes in blood pressure -feeling faint or lightheaded, falls -fever or chills -flushing, sweating, or hot feelings -swelling of the ankles or feet Side effects that usually do not require medical attention (report to your doctor or health care professional if they continue or are bothersome): -diarrhea -headache -nausea, vomiting -stomach pain Where should I keep my medicine? This drug is given in a hospital or clinic and will not be stored at home.  2017 Elsevier/Gold Standard (2015-02-18 12:41:49)  Cyanocobalamin, Vitamin B12 injection What is this medicine? CYANOCOBALAMIN (sye an oh koe BAL a min) is a man made form of vitamin B12. Vitamin B12 is used in the growth of healthy blood cells, nerve cells, and proteins in the body. It also helps with the metabolism of fats and carbohydrates. This medicine is used to treat people who can not absorb vitamin B12. This medicine may be used for other purposes; ask your health care provider or pharmacist if you have questions. COMMON BRAND NAME(S): B-12 Compliance Kit, B-12 Injection Kit, Cyomin, LA-12, Nutri-Twelve, Physicians EZ Use B-12, Primabalt What should I tell my health care provider before I take this medicine? They need to know if you have any of these conditions: -kidney disease -Leber's disease -megaloblastic anemia -an unusual or allergic reaction to cyanocobalamin, cobalt, other medicines, foods, dyes, or preservatives -pregnant or trying to get pregnant -breast-feeding How should I use this medicine? This medicine is injected into a muscle or deeply under the skin. It is usually given by a health care professional in a clinic or doctor's office. However, your doctor may teach you how   to inject yourself. Follow all instructions. Talk to your pediatrician regarding the use of this medicine in children. Special care may be needed. Overdosage: If you think you have taken too much of  this medicine contact a poison control center or emergency room at once. NOTE: This medicine is only for you. Do not share this medicine with others. What if I miss a dose? If you are given your dose at a clinic or doctor's office, call to reschedule your appointment. If you give your own injections and you miss a dose, take it as soon as you can. If it is almost time for your next dose, take only that dose. Do not take double or extra doses. What may interact with this medicine? -colchicine -heavy alcohol intake This list may not describe all possible interactions. Give your health care provider a list of all the medicines, herbs, non-prescription drugs, or dietary supplements you use. Also tell them if you smoke, drink alcohol, or use illegal drugs. Some items may interact with your medicine. What should I watch for while using this medicine? Visit your doctor or health care professional regularly. You may need blood work done while you are taking this medicine. You may need to follow a special diet. Talk to your doctor. Limit your alcohol intake and avoid smoking to get the best benefit. What side effects may I notice from receiving this medicine? Side effects that you should report to your doctor or health care professional as soon as possible: -allergic reactions like skin rash, itching or hives, swelling of the face, lips, or tongue -blue tint to skin -chest tightness, pain -difficulty breathing, wheezing -dizziness -red, swollen painful area on the leg Side effects that usually do not require medical attention (report to your doctor or health care professional if they continue or are bothersome): -diarrhea -headache This list may not describe all possible side effects. Call your doctor for medical advice about side effects. You may report side effects to FDA at 1-800-FDA-1088. Where should I keep my medicine? Keep out of the reach of children. Store at room temperature between 15 and  30 degrees C (59 and 85 degrees F). Protect from light. Throw away any unused medicine after the expiration date. NOTE: This sheet is a summary. It may not cover all possible information. If you have questions about this medicine, talk to your doctor, pharmacist, or health care provider.  2017 Elsevier/Gold Standard (2007-04-29 22:10:20)  

## 2016-02-25 ENCOUNTER — Other Ambulatory Visit: Payer: Self-pay | Admitting: Oncology

## 2016-02-28 ENCOUNTER — Encounter: Payer: Self-pay | Admitting: Oncology

## 2016-02-28 NOTE — Progress Notes (Signed)
Called and spoke to patient to advise that Student Loan disability papers have been filled out and left at reception for pickup

## 2016-03-16 ENCOUNTER — Ambulatory Visit: Payer: Medicare Other

## 2016-04-13 ENCOUNTER — Ambulatory Visit (HOSPITAL_BASED_OUTPATIENT_CLINIC_OR_DEPARTMENT_OTHER): Payer: Medicare Other

## 2016-04-13 VITALS — BP 132/74 | HR 75 | Temp 98.9°F | Resp 20

## 2016-04-13 DIAGNOSIS — G63 Polyneuropathy in diseases classified elsewhere: Secondary | ICD-10-CM

## 2016-04-13 DIAGNOSIS — R29898 Other symptoms and signs involving the musculoskeletal system: Secondary | ICD-10-CM

## 2016-04-13 DIAGNOSIS — E538 Deficiency of other specified B group vitamins: Secondary | ICD-10-CM

## 2016-04-13 DIAGNOSIS — D61818 Other pancytopenia: Secondary | ICD-10-CM

## 2016-04-13 DIAGNOSIS — E539 Vitamin B deficiency, unspecified: Secondary | ICD-10-CM

## 2016-04-13 DIAGNOSIS — G8220 Paraplegia, unspecified: Secondary | ICD-10-CM

## 2016-04-13 MED ORDER — CYANOCOBALAMIN 1000 MCG/ML IJ SOLN
1000.0000 ug | Freq: Once | INTRAMUSCULAR | Status: AC
  Administered 2016-04-13: 1000 ug via INTRAMUSCULAR

## 2016-04-13 NOTE — Patient Instructions (Signed)

## 2016-05-02 IMAGING — MR MR HEAD WO/W CM
11 of 21 series · 28 of 48 positions shown · IV contrast (Yes)
Comparison: CT of the head December 16, 2013 and MRI of the
thoracic spine December 17, 2013

CLINICAL DATA: Acute onset lower extremity numbness and weakness,
unable to walk. Untreated vitamin B12 deficiency. Anemia.

EXAM:
MRI HEAD WITHOUT AND WITH CONTRAST
MRI CERVICAL SPINE WITHOUT AND WITH CONTRAST
TECHNIQUE: Multiplanar, multiecho pulse sequences of the brain and surrounding
structures, and cervical spine, to include the craniocervical
junction and cervicothoracic junction, were obtained without and
with intravenous contrast.
CONTRAST:  20mL MULTIHANCE GADOBENATE DIMEGLUMINE 529 MG/ML IV SOLN

[Series 4: DWI · axial · 5.0mm · 1.09mm/px · z∈[-57,+88]mm · 4 of 60 slices shown (1 of 4)]
[im 1/60]
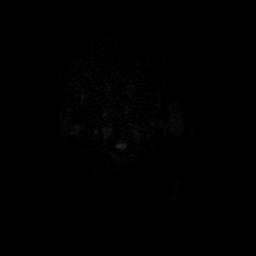
[im 20/60]
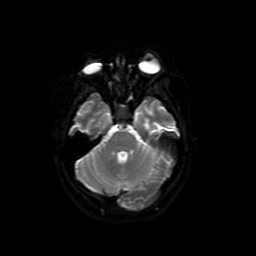
[im 40/60]
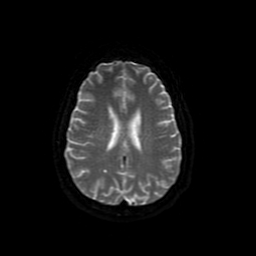
[im 60/60]
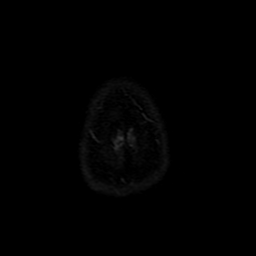

[Series 5: DWI · coronal · 5.0mm · 1.09mm/px · 6 of 76 slices shown (2 of 4)]
[im 1/76]
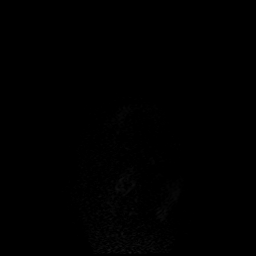
[im 16/76]
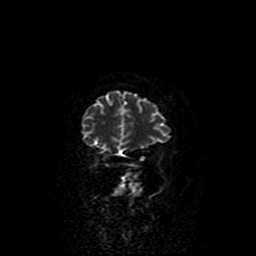
[im 31/76]
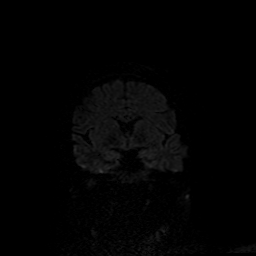
[im 46/76]
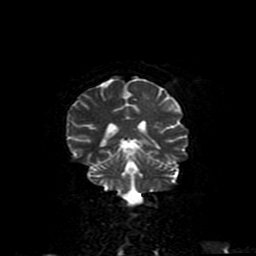
[im 61/76]
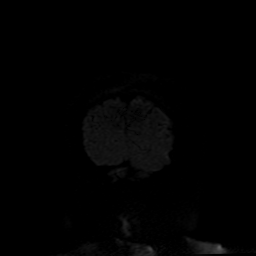
[im 76/76]
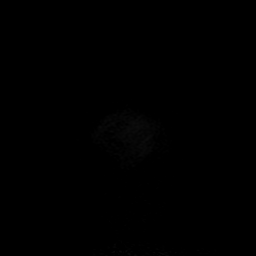

[Series 6: T2 · axial · 5.0mm · 0.43mm/px · z∈[-62,+80]mm · 2 of 23 slices shown (1 of 2)]
[im 1/23]
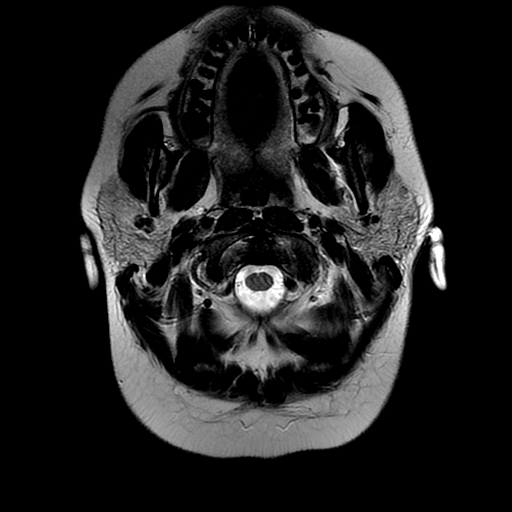
[im 23/23]
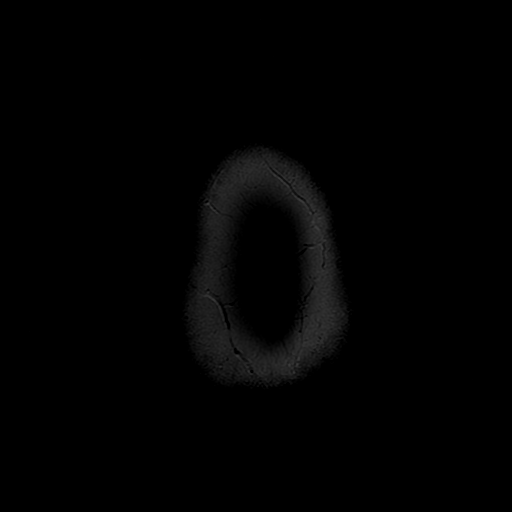

[Series 7: FLAIR · axial · 5.0mm · 0.43mm/px · z∈[-62,+80]mm · 2 of 23 slices shown]
[im 1/23]
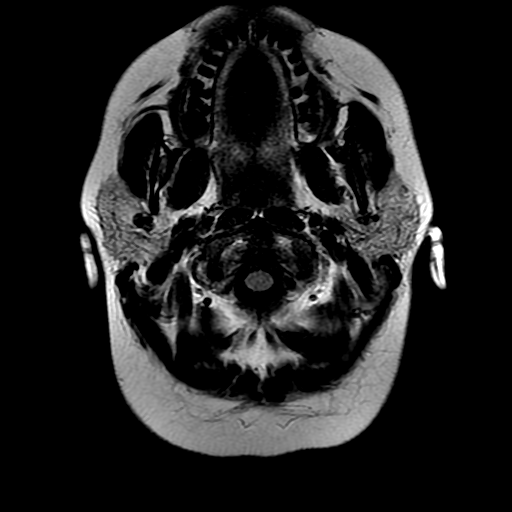
[im 23/23]
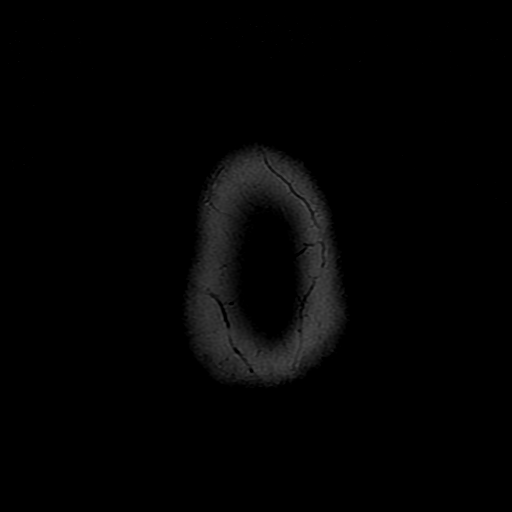

[Series 15: T2 · axial · 3.1mm · 0.35mm/px · z∈[-166,-79]mm · 2 of 26 slices shown (2 of 2)]
[im 1/26]
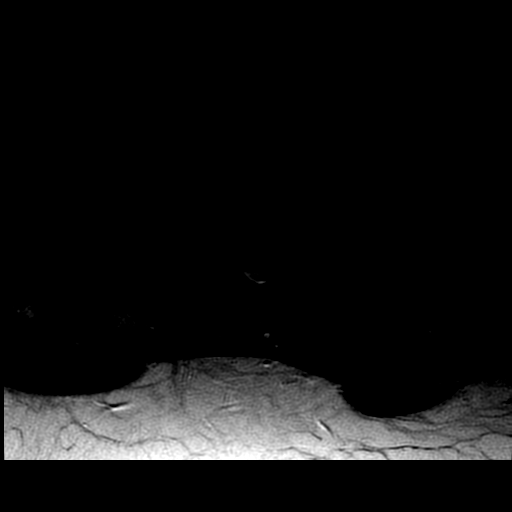
[im 26/26]
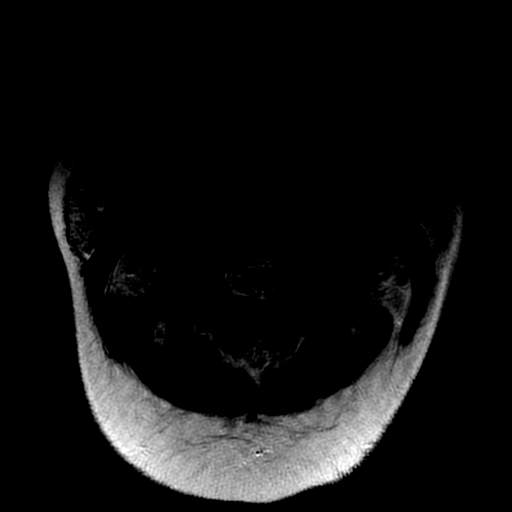

[Series 17: T2 post-contrast · sagittal · 3.0mm · 0.41mm/px · 1 of 12 slices shown]
[im 1/12]
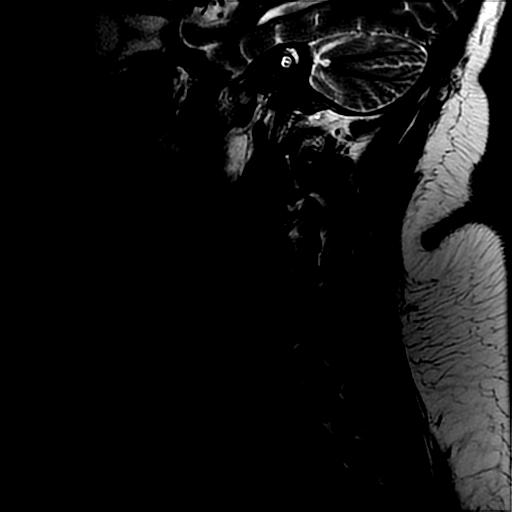

[Series 20: T1 post-contrast · axial · 3.1mm · 0.35mm/px · z∈[-166,-79]mm · 2 of 26 slices shown (1 of 3)]
[im 1/26]
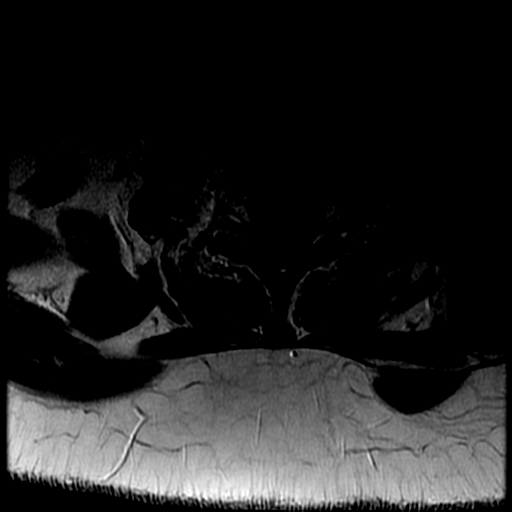
[im 26/26]
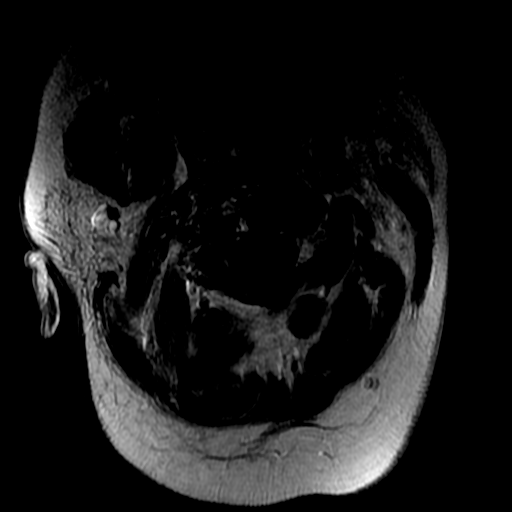

[Series 25: T1 post-contrast · coronal · 5.0mm · 0.45mm/px · 2 of 29 slices shown (2 of 3)]
[im 1/29]
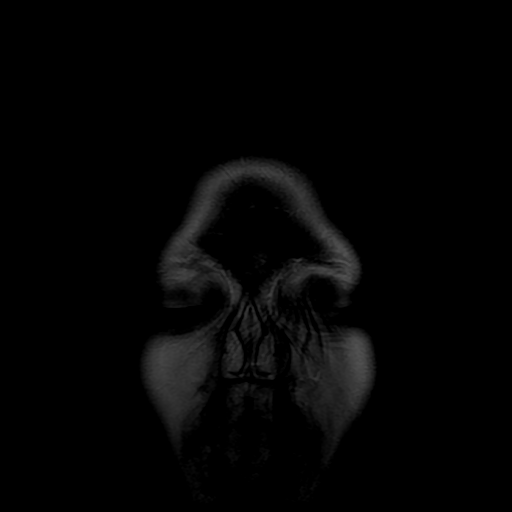
[im 29/29]
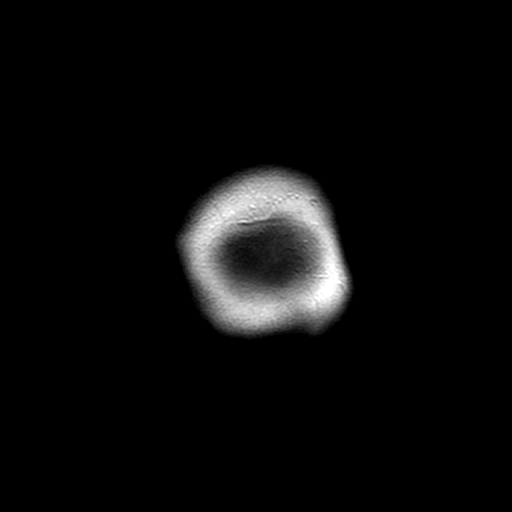

[Series 26: T1 post-contrast · sagittal · 5.0mm · 0.47mm/px · 2 of 24 slices shown (3 of 3)]
[im 1/24]
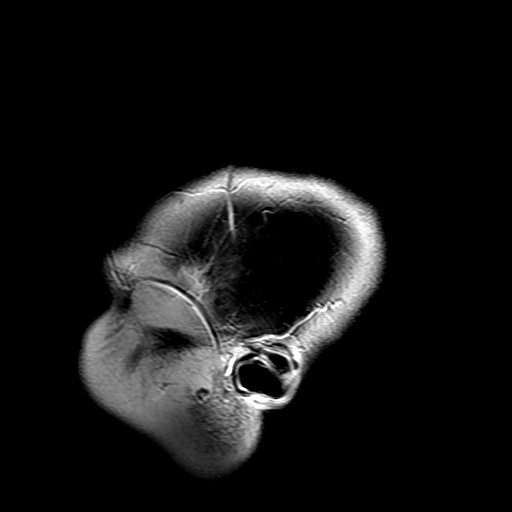
[im 24/24]
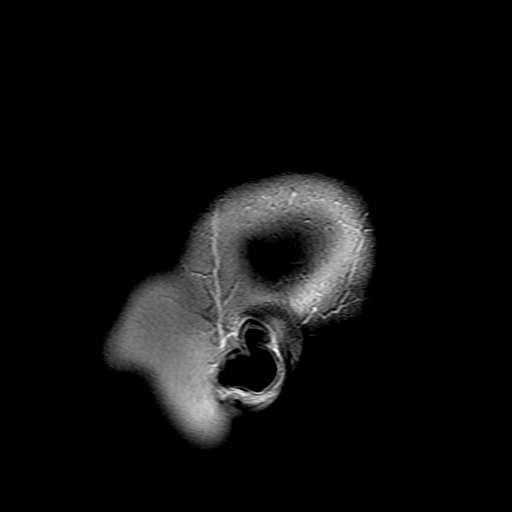

[Series 400: DWI · axial · 5.0mm · 1.09mm/px · z∈[-57,+88]mm · 2 of 30 slices shown (3 of 4)]
[im 1/30]
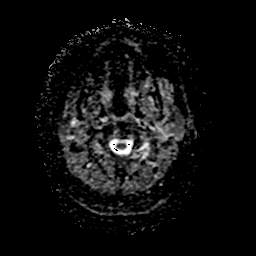
[im 30/30]
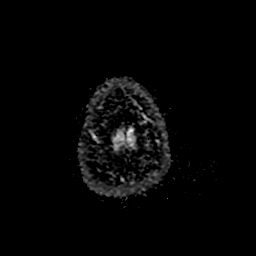

[Series 500: DWI · coronal · 5.0mm · 1.09mm/px · 3 of 38 slices shown (4 of 4)]
[im 1/38]
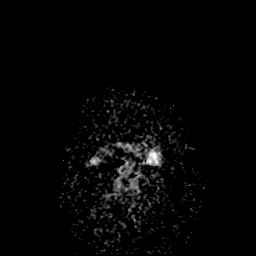
[im 19/38]
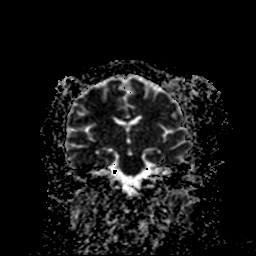
[im 38/38]
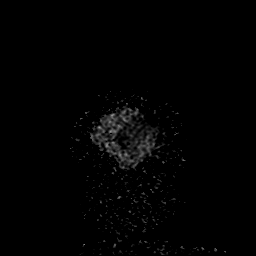

[28 of 48 positions shown; findings below may reference images not displayed]

FINDINGS: MRI HEAD FINDINGS

Mild motion degraded examination.

No reduced diffusion to suggest acute ischemia nor hyperacute
demyelination. No susceptibility artifact to suggest hemorrhage.
Less than 10 supratentorial sub cm white matter FLAIR T2
hyperintensities, predominately in a subcortical white matter
distribution. Scattered perivascular spaces. No mass lesions, mass
effect nor abnormal parenchymal enhancement.

No abnormal extra-axial fluid collections. No abnormal a extra-axial
enhancement. No extra-axial masses. Normal major intracranial
vascular flow voids seen at the skull base.

Trace paranasal sinus mucosal thickening without air-fluid levels.
Ocular globes and orbital contents are unremarkable though not
tailored for evaluation. Mastoid air cells appear well-aerated.
Concave superior margin of the pituitary could reflect arachnoid
cyst or volume loss.

MRI CERVICAL SPINE FINDINGS

Cervical vertebral bodies and posterior elements are intact and
aligned with maintenance of cervical lordosis. Intervertebral discs
demonstrate normal morphology and signal characteristics. No
abnormal bone marrow signal. No abnormal osseous or intradiscal
enhancement.

Cervical spinal cord appears normal in morphology. The axial T2
sequences are moderately motion degraded, which limits sensitivity
for cord signal abnormality, however on the sagittal T2 there is
linear T2 bright signal within the dorsum of the spinal cord from T2
through T3, and towards the LEFT at T5 through T7. No syrinx. No
suspicious cord, leptomeningeal or epidural enhancement. Included
prevertebral and paraspinal soft tissues are unremarkable.

Level by level evaluation demonstrates no significant disc bulge,
canal stenosis or neural foraminal narrowing.
IMPRESSION: MRI HEAD: Less than 10 nonspecific subcentimeter supratentorial
white matter T2 hyperintensities, in a atypical distribution for
classic demyelination. No abnormal parenchymal enhancement or
parenchymal brain volume loss.

MRI CERVICAL SPINE: Abnormal nonenhancing signal within spinal cord,
limited assessment on the axial sequences due to motion, findings
compatible with dorsal column pathology which can be seen with
vitamin B12 deficiency, viral entities. No myelomalacia.

No nerve compressive changes.

  By: Nya Jumper

## 2016-05-11 ENCOUNTER — Ambulatory Visit: Payer: Medicare Other

## 2016-06-08 ENCOUNTER — Ambulatory Visit (HOSPITAL_BASED_OUTPATIENT_CLINIC_OR_DEPARTMENT_OTHER): Payer: Medicare Other

## 2016-06-08 VITALS — BP 126/92 | HR 74 | Temp 98.6°F | Resp 18

## 2016-06-08 DIAGNOSIS — E538 Deficiency of other specified B group vitamins: Secondary | ICD-10-CM | POA: Diagnosis not present

## 2016-06-08 DIAGNOSIS — R29898 Other symptoms and signs involving the musculoskeletal system: Secondary | ICD-10-CM

## 2016-06-08 DIAGNOSIS — E539 Vitamin B deficiency, unspecified: Secondary | ICD-10-CM

## 2016-06-08 DIAGNOSIS — D61818 Other pancytopenia: Secondary | ICD-10-CM

## 2016-06-08 DIAGNOSIS — G8220 Paraplegia, unspecified: Secondary | ICD-10-CM

## 2016-06-08 DIAGNOSIS — G63 Polyneuropathy in diseases classified elsewhere: Secondary | ICD-10-CM

## 2016-06-08 MED ORDER — CYANOCOBALAMIN 1000 MCG/ML IJ SOLN
1000.0000 ug | Freq: Once | INTRAMUSCULAR | Status: AC
  Administered 2016-06-08: 1000 ug via INTRAMUSCULAR

## 2016-06-08 NOTE — Patient Instructions (Signed)

## 2016-06-14 ENCOUNTER — Encounter: Payer: Self-pay | Admitting: Family Medicine

## 2016-07-06 ENCOUNTER — Ambulatory Visit (HOSPITAL_BASED_OUTPATIENT_CLINIC_OR_DEPARTMENT_OTHER): Payer: Medicare Other

## 2016-07-06 VITALS — BP 131/81 | HR 82 | Temp 98.0°F | Resp 18

## 2016-07-06 DIAGNOSIS — G63 Polyneuropathy in diseases classified elsewhere: Secondary | ICD-10-CM

## 2016-07-06 DIAGNOSIS — E538 Deficiency of other specified B group vitamins: Secondary | ICD-10-CM | POA: Diagnosis not present

## 2016-07-06 DIAGNOSIS — G8220 Paraplegia, unspecified: Secondary | ICD-10-CM

## 2016-07-06 DIAGNOSIS — E539 Vitamin B deficiency, unspecified: Secondary | ICD-10-CM

## 2016-07-06 DIAGNOSIS — R29898 Other symptoms and signs involving the musculoskeletal system: Secondary | ICD-10-CM

## 2016-07-06 DIAGNOSIS — D61818 Other pancytopenia: Secondary | ICD-10-CM

## 2016-07-06 MED ORDER — CYANOCOBALAMIN 1000 MCG/ML IJ SOLN
INTRAMUSCULAR | Status: AC
  Filled 2016-07-06: qty 1

## 2016-07-06 MED ORDER — CYANOCOBALAMIN 1000 MCG/ML IJ SOLN
1000.0000 ug | Freq: Once | INTRAMUSCULAR | Status: AC
  Administered 2016-07-06: 1000 ug via INTRAMUSCULAR

## 2016-08-03 ENCOUNTER — Ambulatory Visit: Payer: Medicare Other

## 2016-08-31 ENCOUNTER — Ambulatory Visit (HOSPITAL_BASED_OUTPATIENT_CLINIC_OR_DEPARTMENT_OTHER): Payer: Medicare Other

## 2016-08-31 VITALS — BP 114/74 | HR 95 | Temp 98.0°F | Resp 20

## 2016-08-31 DIAGNOSIS — E538 Deficiency of other specified B group vitamins: Secondary | ICD-10-CM | POA: Diagnosis not present

## 2016-08-31 DIAGNOSIS — G63 Polyneuropathy in diseases classified elsewhere: Secondary | ICD-10-CM

## 2016-08-31 DIAGNOSIS — D61818 Other pancytopenia: Secondary | ICD-10-CM

## 2016-08-31 DIAGNOSIS — R29898 Other symptoms and signs involving the musculoskeletal system: Secondary | ICD-10-CM

## 2016-08-31 DIAGNOSIS — G8220 Paraplegia, unspecified: Secondary | ICD-10-CM

## 2016-08-31 DIAGNOSIS — E539 Vitamin B deficiency, unspecified: Secondary | ICD-10-CM

## 2016-08-31 MED ORDER — CYANOCOBALAMIN 1000 MCG/ML IJ SOLN
1000.0000 ug | Freq: Once | INTRAMUSCULAR | Status: AC
  Administered 2016-08-31: 1000 ug via INTRAMUSCULAR

## 2016-08-31 NOTE — Patient Instructions (Signed)

## 2016-09-28 ENCOUNTER — Ambulatory Visit: Payer: Medicare Other

## 2016-10-26 ENCOUNTER — Ambulatory Visit (HOSPITAL_BASED_OUTPATIENT_CLINIC_OR_DEPARTMENT_OTHER): Payer: Medicare Other

## 2016-10-26 VITALS — BP 122/83 | HR 61 | Temp 97.2°F | Resp 20

## 2016-10-26 DIAGNOSIS — R29898 Other symptoms and signs involving the musculoskeletal system: Secondary | ICD-10-CM

## 2016-10-26 DIAGNOSIS — E538 Deficiency of other specified B group vitamins: Secondary | ICD-10-CM

## 2016-10-26 DIAGNOSIS — G63 Polyneuropathy in diseases classified elsewhere: Secondary | ICD-10-CM

## 2016-10-26 DIAGNOSIS — G8220 Paraplegia, unspecified: Secondary | ICD-10-CM

## 2016-10-26 DIAGNOSIS — E539 Vitamin B deficiency, unspecified: Secondary | ICD-10-CM

## 2016-10-26 DIAGNOSIS — D61818 Other pancytopenia: Secondary | ICD-10-CM

## 2016-10-26 MED ORDER — CYANOCOBALAMIN 1000 MCG/ML IJ SOLN
INTRAMUSCULAR | Status: AC
  Filled 2016-10-26: qty 1

## 2016-10-26 MED ORDER — CYANOCOBALAMIN 1000 MCG/ML IJ SOLN
1000.0000 ug | Freq: Once | INTRAMUSCULAR | Status: AC
  Administered 2016-10-26: 1000 ug via INTRAMUSCULAR

## 2016-11-22 ENCOUNTER — Other Ambulatory Visit: Payer: Self-pay | Admitting: *Deleted

## 2016-11-22 DIAGNOSIS — D61818 Other pancytopenia: Secondary | ICD-10-CM

## 2016-11-22 DIAGNOSIS — G63 Polyneuropathy in diseases classified elsewhere: Principal | ICD-10-CM

## 2016-11-22 DIAGNOSIS — E538 Deficiency of other specified B group vitamins: Secondary | ICD-10-CM

## 2016-11-22 DIAGNOSIS — D509 Iron deficiency anemia, unspecified: Secondary | ICD-10-CM

## 2016-11-22 DIAGNOSIS — E539 Vitamin B deficiency, unspecified: Secondary | ICD-10-CM

## 2016-11-23 ENCOUNTER — Ambulatory Visit: Payer: Medicare Other

## 2016-11-23 ENCOUNTER — Other Ambulatory Visit: Payer: Medicare Other

## 2016-11-23 ENCOUNTER — Ambulatory Visit: Payer: Medicare Other | Admitting: Oncology

## 2016-11-23 NOTE — Progress Notes (Signed)
No show

## 2017-02-06 ENCOUNTER — Telehealth: Payer: Self-pay | Admitting: Oncology

## 2017-02-06 NOTE — Telephone Encounter (Signed)
Patient called in to reschedule her appointments that were missed

## 2017-02-15 NOTE — Progress Notes (Signed)
ID: Jill Summers OB: Jun 21, 1985  MR#: [id number]  WUJ#:[id number]  PCP: Dessa Phi, MD GYN:   SU:  OTHER MD:  CHIEF COMPLAINT: vit B12 anemia, subacute combined degeneration, iron deficiency  CURRENT THERAPY:  vit B12 supplementation, Feraheme as needed  HISTORY OF PRESENT ILLNESS: From the earlier summary:  Jill Summers is a right handed female with history of depression and bronchial asthma as well as recent cellulitis of the umbilicus and treated with Bactrim in September 2015 as well as recent MRSA PCR screen +2 months ago as well as previously untreated B-12 deficiency. Patient lives with children independent prior to admission and her mother has been assisting with care of the children recently. Admitted 12/16/2013 with bouts of dizziness over the past 2 weeks as well as lower strandy weakness and numbness. She denied any falls. No change in bowel or bladder. Cranial CT scan negative. MRI of thoracic lumbar and cervical spine showed multiple bilateral thoracic cord lesions in the lateral white matter most compatible with demyelinating disease of question etiology and workup ongoing as per neurology services and felt cord lesions most likely manifestations of B-12 deficiency. Partial visualization of lower cervical cord shows white matter lesions with full cervical spine films pending. MRI of the brain 12/18/2013 showed less than 10 nonspecific subcentimeter supratentorial white matter T2 hyperintensities in a atypical distribution for classic demyelination. Found to have severe pancytopenia with WBC 1.4, ANC 0.4, hemoglobin 5.5, MCV 101.9, platelets 37,000. Hematology service consulted (Dr. Darnelle Catalan) She was transfused. Etiology is secondary to B-12 deficiency and currently maintained on supplementation with plan to follow-up with hematology services mostly for B-12 injections   INTERVAL HISTORY: Jill Summers returns today for follow up of her vitamin B12 deficiency. She is  supposed to be receiving B12 supplementation monthly, but went AWOL after the 10/26/2016 dose.  She says that since that time she has noted she has more balance problems.  She sees where the Jeanette Caprice is but misses it or sees whether wall is but still hits it.  There have been no falls.  Sometimes she is driving and she is not sure where her right foot is.  She continues to have regular periods, once a month, lasting about for 5 days with the first 2 days heavy  REVIEW OF SYSTEMS: Jill Summers tells me her asthma is better in the winter.  She has had no unusual headaches, visual changes, nausea, vomiting, cough, phlegm production, pleurisy, or any change in bowel or bladder habits.  A detailed review of systems today was otherwise stable.  PAST MEDICAL HISTORY: Past Medical History:  Diagnosis Date  . Adopted   . Asthma    since baby  . Bacterial vaginosis   . Chlamydia   . Depression    At age 3  . MRSA infection   . Obesity   . Urinary tract infection     PAST SURGICAL HISTORY: Past Surgical History:  Procedure Laterality Date  . CESAREAN SECTION    . CESAREAN SECTION WITH BILATERAL TUBAL LIGATION      FAMILY HISTORY Family History  Adopted: Yes  Mother - hypertension, Father - diabetes  GYNECOLOGIC HISTORY:  Patient has regular menstrual periods. She is status post bilateral tubal ligation. Menarche age 69, she is GX P2    SOCIAL HISTORY: (Updated January 2019) Disabled due to asthma; lives with 62 y/o son Jill Summers. Mother Therisa Mennella lives next door--she is a retired Arts administrator SW-- with patient's 12 y/o child, Jill Summers.  ADVANCED DIRECTIVES:  not in place    HEALTH MAINTENANCE: Social History   Tobacco Use  . Smoking status: Never Smoker  . Smokeless tobacco: Never Used  Substance Use Topics  . Alcohol use: No    Alcohol/week: 1.0 oz    Types: 2 Standard drinks or equivalent per week  . Drug use: No    Allergies  Allergen Reactions  . Peanut-Containing Drug Products  Anaphylaxis, Hives and Swelling    *Throat swelling*  . Shellfish Allergy Anaphylaxis, Hives and Swelling    Throat swelling    Current Outpatient Medications  Medication Sig Dispense Refill  . albuterol (PROVENTIL HFA;VENTOLIN HFA) 108 (90 Base) MCG/ACT inhaler Inhale 2 puffs into the lungs every 6 (six) hours as needed for wheezing or shortness of breath. For asthma 1 Inhaler 12  . buPROPion (WELLBUTRIN XL) 150 MG 24 hr tablet Take 1 tablet (150 mg total) by mouth daily. 90 tablet 4  . cyanocobalamin (,VITAMIN B-12,) 1000 MCG/ML injection Inject 1 mL (1,000 mcg total) into the muscle every 30 (thirty) days. 1 mL 1   No current facility-administered medications for this visit.     OBJECTIVE: Young African-American woman in no acute distress  Vitals:   02/19/17 0846  BP: (!) 124/92  Pulse: 61  Resp: 18  Temp: 98.4 F (36.9 C)  SpO2: 100%     Body mass index is 35.96 kg/m.   ROS  Physical Exam   ECOG FS:1 - Symptomatic but completely ambulatory  Sclerae unicteric, EOMs intact Oropharynx clear and moist No cervical or supraclavicular adenopathy Lungs no rales or rhonchi Heart regular rate and rhythm Abd soft, nontender, positive bowel sounds MSK no focal spinal tenderness, no upper extremity lymphedema Neuro: nonfocal, well oriented, appropriate affect Breasts: Deferred    LAB RESULTS:  CBC    Component Value Date/Time   WBC 5.5 02/19/2017 0826   RBC 4.39 02/19/2017 0826   HGB 12.7 02/19/2017 0826   HGB 12.3 01/20/2016 1404   HCT 38.8 02/19/2017 0826   HCT 37.1 01/20/2016 1404   PLT 222 02/19/2017 0826   PLT 249 01/20/2016 1404   MCV 88.5 02/19/2017 0826   MCV 85.5 01/20/2016 1404   MCH 29.0 02/19/2017 0826   MCHC 32.8 02/19/2017 0826   RDW 14.6 02/19/2017 0826   RDW 14.1 01/20/2016 1404   LYMPHSABS 2.0 02/19/2017 0826   LYMPHSABS 2.8 01/20/2016 1404   MONOABS 0.3 02/19/2017 0826   MONOABS 0.2 01/20/2016 1404   EOSABS 0.2 02/19/2017 0826   EOSABS 0.1  01/20/2016 1404   BASOSABS 0.0 02/19/2017 0826   BASOSABS 0.0 01/20/2016 1404   CMP     Component Value Date/Time   NA 140 02/04/2015 1049   K 3.9 02/04/2015 1049   CL 106 12/24/2013 0718   CO2 23 02/04/2015 1049   GLUCOSE 98 02/04/2015 1049   BUN 9.8 02/04/2015 1049   CREATININE 0.9 02/04/2015 1049   CALCIUM 8.8 02/04/2015 1049   PROT 6.9 02/04/2015 1049   ALBUMIN 3.4 (L) 02/04/2015 1049   AST 13 02/04/2015 1049   ALT <9 02/04/2015 1049   ALKPHOS 53 02/04/2015 1049   BILITOT 0.47 02/04/2015 1049   GFRNONAA >90 12/24/2013 0718   GFRAA >90 12/24/2013 0718        Component Value Date/Time   COLORURINE AMBER (A) 12/16/2013 0707   APPEARANCEUR HAZY (A) 12/16/2013 0707   LABSPEC 1.025 12/16/2013 0707   PHURINE 5.0 12/16/2013 0707   GLUCOSEU NEGATIVE 12/16/2013 0707   HGBUR LARGE (  A) 12/16/2013 0707   BILIRUBINUR SMALL (A) 12/16/2013 0707   KETONESUR NEGATIVE 12/16/2013 0707   PROTEINUR NEGATIVE 12/16/2013 0707   UROBILINOGEN >8.0 (H) 12/16/2013 0707   NITRITE NEGATIVE 12/16/2013 0707   LEUKOCYTESUR SMALL (A) 12/16/2013 0707   Iron/TIBC/Ferritin/ %Sat    Component Value Date/Time   IRON 113 12/15/2013 2108   TIBC 207 (L) 12/15/2013 2108   FERRITIN 9 12/09/2015 1037   IRONPCTSAT 55 12/15/2013 2108    STUDIES: Recent labs reviewed; ferritin today pending  ASSESSMENT: 33 y.o. [place] woman presenting 11/15/2013 with inability to walk, fever and pancytopenia, found to be severely B-12 deficient  (1) B-12 deficiency: will need lifelong replacement; as patient does not have PCP this can be done through our office (a) will receive montlhy  (2) subacute combined degeneration - Stable  (3) iron deficiency: ferritin level on 02/04/15 was 6. Feraheme given on 03/11/15 and 03/19/15.   (a) iron level less than 10 on 01/20/2016. Feraheme given 02/24/2016  PLAN:  Esthela generally does well when she receives her shots.  She should have a "Depo" so  missing 2 or 3 months should not make a difference.  Nevertheless she does note a difference and feels her balance and sensation are altered.  I think neurologic referral is a good idea at this point and I have placed a consult to Dr. Karel Jarvis with that in mind.  Otherwise we are checking a ferritin today, but the hemoglobin and MCV are normal so I do not believe she is going to need an iron infusion.  We are continuing the monthly B12 shots and yearly visits with yearly labs as before.  She knows to call for any other issues that may develop before then.  Magrinat, Valentino Hue, MD  02/19/17 9:03 AM Medical Oncology and Hematology [facility] [address] [place], [place] [address] Tel. [phone]    Fax. [phone]  This document serves as a record of services personally performed by Ruthann Cancer, MD. It was created on his behalf by Merideth Abbey, a trained medical scribe. The creation of this record is based on the scribe's personal observations and the provider's statements to them.   I have reviewed the above documentation for accuracy and completeness, and I agree with the above.

## 2017-02-19 ENCOUNTER — Telehealth: Payer: Self-pay | Admitting: *Deleted

## 2017-02-19 ENCOUNTER — Inpatient Hospital Stay: Payer: Medicare Other | Attending: Oncology | Admitting: Oncology

## 2017-02-19 ENCOUNTER — Inpatient Hospital Stay: Payer: Medicare Other

## 2017-02-19 ENCOUNTER — Encounter: Payer: Self-pay | Admitting: Oncology

## 2017-02-19 ENCOUNTER — Encounter: Payer: Self-pay | Admitting: Neurology

## 2017-02-19 ENCOUNTER — Telehealth: Payer: Self-pay | Admitting: Oncology

## 2017-02-19 VITALS — BP 124/92 | HR 61 | Temp 98.4°F | Resp 18 | Ht 63.0 in | Wt 203.0 lb

## 2017-02-19 DIAGNOSIS — E539 Vitamin B deficiency, unspecified: Secondary | ICD-10-CM

## 2017-02-19 DIAGNOSIS — G8220 Paraplegia, unspecified: Secondary | ICD-10-CM

## 2017-02-19 DIAGNOSIS — G320 Subacute combined degeneration of spinal cord in diseases classified elsewhere: Secondary | ICD-10-CM | POA: Diagnosis not present

## 2017-02-19 DIAGNOSIS — G63 Polyneuropathy in diseases classified elsewhere: Secondary | ICD-10-CM

## 2017-02-19 DIAGNOSIS — E538 Deficiency of other specified B group vitamins: Secondary | ICD-10-CM | POA: Diagnosis not present

## 2017-02-19 DIAGNOSIS — E669 Obesity, unspecified: Secondary | ICD-10-CM | POA: Diagnosis not present

## 2017-02-19 DIAGNOSIS — D508 Other iron deficiency anemias: Secondary | ICD-10-CM | POA: Diagnosis not present

## 2017-02-19 DIAGNOSIS — D509 Iron deficiency anemia, unspecified: Secondary | ICD-10-CM | POA: Diagnosis not present

## 2017-02-19 DIAGNOSIS — J45909 Unspecified asthma, uncomplicated: Secondary | ICD-10-CM | POA: Insufficient documentation

## 2017-02-19 DIAGNOSIS — F329 Major depressive disorder, single episode, unspecified: Secondary | ICD-10-CM | POA: Diagnosis not present

## 2017-02-19 DIAGNOSIS — D61818 Other pancytopenia: Secondary | ICD-10-CM

## 2017-02-19 DIAGNOSIS — Z79899 Other long term (current) drug therapy: Secondary | ICD-10-CM

## 2017-02-19 DIAGNOSIS — R29898 Other symptoms and signs involving the musculoskeletal system: Secondary | ICD-10-CM

## 2017-02-19 LAB — CBC WITH DIFFERENTIAL/PLATELET
Basophils Absolute: 0 10*3/uL (ref 0.0–0.1)
Basophils Relative: 0 %
Eosinophils Absolute: 0.2 10*3/uL (ref 0.0–0.5)
Eosinophils Relative: 3 %
HCT: 38.8 % (ref 34.8–46.6)
Hemoglobin: 12.7 g/dL (ref 11.6–15.9)
Lymphocytes Relative: 36 %
Lymphs Abs: 2 10*3/uL (ref 0.9–3.3)
MCH: 29 pg (ref 25.1–34.0)
MCHC: 32.8 g/dL (ref 31.5–36.0)
MCV: 88.5 fL (ref 79.5–101.0)
Monocytes Absolute: 0.3 10*3/uL (ref 0.1–0.9)
Monocytes Relative: 6 %
Neutro Abs: 3 10*3/uL (ref 1.5–6.5)
Neutrophils Relative %: 55 %
Platelets: 222 10*3/uL (ref 145–400)
RBC: 4.39 MIL/uL (ref 3.70–5.45)
RDW: 14.6 % (ref 11.2–16.1)
WBC: 5.5 10*3/uL (ref 3.9–10.3)

## 2017-02-19 LAB — FERRITIN: Ferritin: 5 ng/mL — ABNORMAL LOW (ref 9–269)

## 2017-02-19 LAB — VITAMIN B12: Vitamin B-12: 139 pg/mL — ABNORMAL LOW (ref 180–914)

## 2017-02-19 MED ORDER — CYANOCOBALAMIN 1000 MCG/ML IJ SOLN
1000.0000 ug | Freq: Once | INTRAMUSCULAR | Status: AC
  Administered 2017-02-19: 1000 ug via INTRAMUSCULAR

## 2017-02-19 NOTE — Telephone Encounter (Signed)
Scheduled appts per 1/21 sch msg and los. Left voicemail for patient regarding appts.

## 2017-02-19 NOTE — Telephone Encounter (Signed)
Called pt per Dr Darnelle CatalanMagrinat & informed that she needs feraheme x 2 & asked what dates she would like to come.  She states she can come anytime early am.  Message to schedulers to schedule & let Dr Henrietta HooverMagrinat/RN know dates for orders.

## 2017-02-20 ENCOUNTER — Other Ambulatory Visit: Payer: Self-pay | Admitting: Oncology

## 2017-02-20 ENCOUNTER — Ambulatory Visit: Payer: Medicare Other

## 2017-02-20 NOTE — Telephone Encounter (Signed)
1000- Patient did not come to 0800 appointment today on 1/22 at 0800 and asked for a later time in the morning.  Rescheduled her 1/29 appointment to 09:30 am and scheduled a 2/5 appointment at 09:45 am.  She is aware of the time changes that she requested.

## 2017-02-27 ENCOUNTER — Telehealth: Payer: Self-pay | Admitting: *Deleted

## 2017-02-27 ENCOUNTER — Ambulatory Visit: Payer: Medicare Other

## 2017-02-27 ENCOUNTER — Telehealth: Payer: Self-pay | Admitting: Oncology

## 2017-02-27 NOTE — Telephone Encounter (Signed)
Pt failed to keep appointment for iron today. Message to collaborative RN to follow up.

## 2017-02-27 NOTE — Telephone Encounter (Signed)
Patient called to reschedule appointment missed  °

## 2017-03-06 ENCOUNTER — Inpatient Hospital Stay: Payer: Medicare Other | Attending: Oncology

## 2017-03-06 VITALS — BP 110/80 | HR 73 | Temp 98.0°F | Resp 18

## 2017-03-06 DIAGNOSIS — D61818 Other pancytopenia: Secondary | ICD-10-CM

## 2017-03-06 DIAGNOSIS — D509 Iron deficiency anemia, unspecified: Secondary | ICD-10-CM

## 2017-03-06 DIAGNOSIS — G8220 Paraplegia, unspecified: Secondary | ICD-10-CM

## 2017-03-06 DIAGNOSIS — E539 Vitamin B deficiency, unspecified: Secondary | ICD-10-CM

## 2017-03-06 DIAGNOSIS — G63 Polyneuropathy in diseases classified elsewhere: Secondary | ICD-10-CM

## 2017-03-06 DIAGNOSIS — E538 Deficiency of other specified B group vitamins: Secondary | ICD-10-CM | POA: Diagnosis not present

## 2017-03-06 MED ORDER — FERUMOXYTOL INJECTION 510 MG/17 ML
510.0000 mg | Freq: Once | INTRAVENOUS | Status: AC
  Administered 2017-03-06: 510 mg via INTRAVENOUS
  Filled 2017-03-06: qty 17

## 2017-03-06 MED ORDER — SODIUM CHLORIDE 0.9 % IV SOLN
INTRAVENOUS | Status: DC
  Administered 2017-03-06: 10:00:00 via INTRAVENOUS

## 2017-03-06 NOTE — Patient Instructions (Signed)

## 2017-03-13 ENCOUNTER — Inpatient Hospital Stay: Payer: Medicare Other

## 2017-03-13 VITALS — BP 104/68 | HR 64 | Temp 98.2°F | Resp 20

## 2017-03-13 DIAGNOSIS — D61818 Other pancytopenia: Secondary | ICD-10-CM

## 2017-03-13 DIAGNOSIS — D509 Iron deficiency anemia, unspecified: Secondary | ICD-10-CM

## 2017-03-13 DIAGNOSIS — R29898 Other symptoms and signs involving the musculoskeletal system: Secondary | ICD-10-CM

## 2017-03-13 DIAGNOSIS — G8220 Paraplegia, unspecified: Secondary | ICD-10-CM

## 2017-03-13 DIAGNOSIS — E538 Deficiency of other specified B group vitamins: Secondary | ICD-10-CM | POA: Diagnosis not present

## 2017-03-13 DIAGNOSIS — G63 Polyneuropathy in diseases classified elsewhere: Secondary | ICD-10-CM

## 2017-03-13 DIAGNOSIS — E539 Vitamin B deficiency, unspecified: Secondary | ICD-10-CM

## 2017-03-13 MED ORDER — SODIUM CHLORIDE 0.9 % IV SOLN
510.0000 mg | Freq: Once | INTRAVENOUS | Status: AC
  Administered 2017-03-13: 510 mg via INTRAVENOUS
  Filled 2017-03-13: qty 17

## 2017-03-13 MED ORDER — CYANOCOBALAMIN 1000 MCG/ML IJ SOLN
INTRAMUSCULAR | Status: AC
  Filled 2017-03-13: qty 1

## 2017-03-13 MED ORDER — CYANOCOBALAMIN 1000 MCG/ML IJ SOLN
1000.0000 ug | Freq: Once | INTRAMUSCULAR | Status: AC
  Administered 2017-03-13: 1000 ug via INTRAMUSCULAR

## 2017-03-13 NOTE — Patient Instructions (Addendum)
Ferumoxytol injection What is this medicine? FERUMOXYTOL is an iron complex. Iron is used to make healthy red blood cells, which carry oxygen and nutrients throughout the body. This medicine is used to treat iron deficiency anemia in people with chronic kidney disease. This medicine may be used for other purposes; ask your health care provider or pharmacist if you have questions. COMMON BRAND NAME(S): Feraheme What should I tell my health care provider before I take this medicine? They need to know if you have any of these conditions: -anemia not caused by low iron levels -high levels of iron in the blood -magnetic resonance imaging (MRI) test scheduled -an unusual or allergic reaction to iron, other medicines, foods, dyes, or preservatives -pregnant or trying to get pregnant -breast-feeding How should I use this medicine? This medicine is for injection into a vein. It is given by a health care professional in a hospital or clinic setting. Talk to your pediatrician regarding the use of this medicine in children. Special care may be needed. Overdosage: If you think you have taken too much of this medicine contact a poison control center or emergency room at once. NOTE: This medicine is only for you. Do not share this medicine with others. What if I miss a dose? It is important not to miss your dose. Call your doctor or health care professional if you are unable to keep an appointment. What may interact with this medicine? This medicine may interact with the following medications: -other iron products This list may not describe all possible interactions. Give your health care provider a list of all the medicines, herbs, non-prescription drugs, or dietary supplements you use. Also tell them if you smoke, drink alcohol, or use illegal drugs. Some items may interact with your medicine. What should I watch for while using this medicine? Visit your doctor or healthcare professional regularly. Tell  your doctor or healthcare professional if your symptoms do not start to get better or if they get worse. You may need blood work done while you are taking this medicine. You may need to follow a special diet. Talk to your doctor. Foods that contain iron include: whole grains/cereals, dried fruits, beans, or peas, leafy green vegetables, and organ meats (liver, kidney). What side effects may I notice from receiving this medicine? Side effects that you should report to your doctor or health care professional as soon as possible: -allergic reactions like skin rash, itching or hives, swelling of the face, lips, or tongue -breathing problems -changes in blood pressure -feeling faint or lightheaded, falls -fever or chills -flushing, sweating, or hot feelings -swelling of the ankles or feet Side effects that usually do not require medical attention (report to your doctor or health care professional if they continue or are bothersome): -diarrhea -headache -nausea, vomiting -stomach pain This list may not describe all possible side effects. Call your doctor for medical advice about side effects. You may report side effects to FDA at 1-800-FDA-1088. Where should I keep my medicine? This drug is given in a hospital or clinic and will not be stored at home. NOTE: This sheet is a summary. It may not cover all possible information. If you have questions about this medicine, talk to your doctor, pharmacist, or health care provider.  2018 Elsevier/Gold Standard (2015-02-18 12:41:49)  Cyanocobalamin, Vitamin B12 injection What is this medicine? CYANOCOBALAMIN (sye an oh koe BAL a min) is a man made form of vitamin B12. Vitamin B12 is used in the growth of healthy blood cells,   nerve cells, and proteins in the body. It also helps with the metabolism of fats and carbohydrates. This medicine is used to treat people who can not absorb vitamin B12. This medicine may be used for other purposes; ask your health  care provider or pharmacist if you have questions. COMMON BRAND NAME(S): B-12 Compliance Kit, B-12 Injection Kit, Cyomin, LA-12, Nutri-Twelve, Physicians EZ Use B-12, Primabalt What should I tell my health care provider before I take this medicine? They need to know if you have any of these conditions: -kidney disease -Leber's disease -megaloblastic anemia -an unusual or allergic reaction to cyanocobalamin, cobalt, other medicines, foods, dyes, or preservatives -pregnant or trying to get pregnant -breast-feeding How should I use this medicine? This medicine is injected into a muscle or deeply under the skin. It is usually given by a health care professional in a clinic or doctor's office. However, your doctor may teach you how to inject yourself. Follow all instructions. Talk to your pediatrician regarding the use of this medicine in children. Special care may be needed. Overdosage: If you think you have taken too much of this medicine contact a poison control center or emergency room at once. NOTE: This medicine is only for you. Do not share this medicine with others. What if I miss a dose? If you are given your dose at a clinic or doctor's office, call to reschedule your appointment. If you give your own injections and you miss a dose, take it as soon as you can. If it is almost time for your next dose, take only that dose. Do not take double or extra doses. What may interact with this medicine? -colchicine -heavy alcohol intake This list may not describe all possible interactions. Give your health care provider a list of all the medicines, herbs, non-prescription drugs, or dietary supplements you use. Also tell them if you smoke, drink alcohol, or use illegal drugs. Some items may interact with your medicine. What should I watch for while using this medicine? Visit your doctor or health care professional regularly. You may need blood work done while you are taking this medicine. You may  need to follow a special diet. Talk to your doctor. Limit your alcohol intake and avoid smoking to get the best benefit. What side effects may I notice from receiving this medicine? Side effects that you should report to your doctor or health care professional as soon as possible: -allergic reactions like skin rash, itching or hives, swelling of the face, lips, or tongue -blue tint to skin -chest tightness, pain -difficulty breathing, wheezing -dizziness -red, swollen painful area on the leg Side effects that usually do not require medical attention (report to your doctor or health care professional if they continue or are bothersome): -diarrhea -headache This list may not describe all possible side effects. Call your doctor for medical advice about side effects. You may report side effects to FDA at 1-800-FDA-1088. Where should I keep my medicine? Keep out of the reach of children. Store at room temperature between 15 and 30 degrees C (59 and 85 degrees F). Protect from light. Throw away any unused medicine after the expiration date. NOTE: This sheet is a summary. It may not cover all possible information. If you have questions about this medicine, talk to your doctor, pharmacist, or health care provider.  2018 Elsevier/Gold Standard (2007-04-29 22:10:20)  

## 2017-03-20 ENCOUNTER — Ambulatory Visit: Payer: Medicare Other

## 2017-04-17 ENCOUNTER — Inpatient Hospital Stay: Payer: Medicare Other | Attending: Oncology

## 2017-04-17 VITALS — BP 129/73 | HR 67 | Temp 98.0°F | Resp 18

## 2017-04-17 DIAGNOSIS — E538 Deficiency of other specified B group vitamins: Secondary | ICD-10-CM | POA: Diagnosis not present

## 2017-04-17 DIAGNOSIS — G8220 Paraplegia, unspecified: Secondary | ICD-10-CM

## 2017-04-17 DIAGNOSIS — D509 Iron deficiency anemia, unspecified: Secondary | ICD-10-CM | POA: Diagnosis not present

## 2017-04-17 DIAGNOSIS — G63 Polyneuropathy in diseases classified elsewhere: Secondary | ICD-10-CM

## 2017-04-17 DIAGNOSIS — Z79899 Other long term (current) drug therapy: Secondary | ICD-10-CM | POA: Diagnosis not present

## 2017-04-17 DIAGNOSIS — E539 Vitamin B deficiency, unspecified: Secondary | ICD-10-CM

## 2017-04-17 DIAGNOSIS — D61818 Other pancytopenia: Secondary | ICD-10-CM

## 2017-04-17 DIAGNOSIS — R29898 Other symptoms and signs involving the musculoskeletal system: Secondary | ICD-10-CM

## 2017-04-17 MED ORDER — CYANOCOBALAMIN 1000 MCG/ML IJ SOLN
1000.0000 ug | Freq: Once | INTRAMUSCULAR | Status: AC
  Administered 2017-04-17: 1000 ug via INTRAMUSCULAR

## 2017-04-17 MED ORDER — CYANOCOBALAMIN 1000 MCG/ML IJ SOLN
INTRAMUSCULAR | Status: AC
  Filled 2017-04-17: qty 1

## 2017-05-15 ENCOUNTER — Inpatient Hospital Stay: Payer: Medicare Other | Attending: Oncology

## 2017-05-15 VITALS — BP 133/66 | HR 73 | Temp 98.1°F | Resp 18

## 2017-05-15 DIAGNOSIS — E538 Deficiency of other specified B group vitamins: Secondary | ICD-10-CM | POA: Diagnosis not present

## 2017-05-15 DIAGNOSIS — D509 Iron deficiency anemia, unspecified: Secondary | ICD-10-CM | POA: Insufficient documentation

## 2017-05-15 DIAGNOSIS — E539 Vitamin B deficiency, unspecified: Secondary | ICD-10-CM

## 2017-05-15 DIAGNOSIS — D61818 Other pancytopenia: Secondary | ICD-10-CM

## 2017-05-15 DIAGNOSIS — G63 Polyneuropathy in diseases classified elsewhere: Secondary | ICD-10-CM

## 2017-05-15 DIAGNOSIS — G8220 Paraplegia, unspecified: Secondary | ICD-10-CM

## 2017-05-15 DIAGNOSIS — R29898 Other symptoms and signs involving the musculoskeletal system: Secondary | ICD-10-CM

## 2017-05-15 DIAGNOSIS — Z79899 Other long term (current) drug therapy: Secondary | ICD-10-CM | POA: Diagnosis not present

## 2017-05-15 MED ORDER — CYANOCOBALAMIN 1000 MCG/ML IJ SOLN
INTRAMUSCULAR | Status: AC
  Filled 2017-05-15: qty 1

## 2017-05-15 MED ORDER — CYANOCOBALAMIN 1000 MCG/ML IJ SOLN
1000.0000 ug | Freq: Once | INTRAMUSCULAR | Status: AC
  Administered 2017-05-15: 1000 ug via INTRAMUSCULAR

## 2017-05-21 ENCOUNTER — Ambulatory Visit: Payer: Medicare Other | Admitting: Neurology

## 2017-06-12 ENCOUNTER — Inpatient Hospital Stay: Payer: Medicare Other | Attending: Oncology

## 2017-06-12 MED ORDER — CYANOCOBALAMIN 1000 MCG/ML IJ SOLN
INTRAMUSCULAR | Status: AC
  Filled 2017-06-12: qty 1

## 2017-07-10 ENCOUNTER — Inpatient Hospital Stay: Payer: Medicare Other | Attending: Oncology

## 2017-07-10 MED ORDER — CYANOCOBALAMIN 1000 MCG/ML IJ SOLN
INTRAMUSCULAR | Status: AC
  Filled 2017-07-10: qty 1

## 2017-07-13 ENCOUNTER — Other Ambulatory Visit: Payer: Self-pay

## 2017-07-13 ENCOUNTER — Emergency Department (HOSPITAL_COMMUNITY)
Admission: EM | Admit: 2017-07-13 | Discharge: 2017-07-13 | Disposition: A | Discharge status: Home / Self Care | Payer: Medicare Other | Attending: Emergency Medicine | Admitting: Emergency Medicine

## 2017-07-13 ENCOUNTER — Encounter (HOSPITAL_COMMUNITY): Payer: Self-pay | Admitting: Emergency Medicine

## 2017-07-13 DIAGNOSIS — R2232 Localized swelling, mass and lump, left upper limb: Secondary | ICD-10-CM | POA: Diagnosis present

## 2017-07-13 DIAGNOSIS — Z91010 Allergy to peanuts: Secondary | ICD-10-CM | POA: Insufficient documentation

## 2017-07-13 DIAGNOSIS — Z79899 Other long term (current) drug therapy: Secondary | ICD-10-CM | POA: Insufficient documentation

## 2017-07-13 DIAGNOSIS — J45909 Unspecified asthma, uncomplicated: Secondary | ICD-10-CM | POA: Insufficient documentation

## 2017-07-13 DIAGNOSIS — M7989 Other specified soft tissue disorders: Secondary | ICD-10-CM | POA: Insufficient documentation

## 2017-07-13 NOTE — ED Notes (Signed)
We used a ring cutter to get ring off patient finger.

## 2017-07-13 NOTE — ED Provider Notes (Signed)
[facility]-EMERGENCY DEPT Provider Note   CSN: [id number] Arrival date & time: 07/13/17  2256     History   Chief Complaint Chief Complaint  Patient presents with  . ring stuck on finger    HPI Jill Summers is a 32 y.o. female.  Patient presents to the emergency department because she has a ring stuck on her left middle finger.  She has not had any injury, the ring has been on for some time and she try to take it off tonight and could not.  She is not expensing any pain.     Past Medical History:  Diagnosis Date  . Adopted   . Asthma    since baby  . Bacterial vaginosis   . Chlamydia   . Depression    At age 32  . MRSA infection   . Obesity   . Urinary tract infection     Patient Active Problem List   Diagnosis Date Noted  . Iron deficiency anemia 02/24/2016  . Constipation 01/06/2014  . Major depressive disorder, recurrent, in remission (HCC)   . Paraplegia (HCC) 12/19/2013  . Polyneuropathy due to vitamin B deficiency (HCC) 12/19/2013  . Vitamin B 12 deficiency 12/18/2013  . Protein-calorie malnutrition, severe (HCC) 12/17/2013  . Pancytopenia (HCC) 12/15/2013  . MRSA (methicillin resistant staph aureus) culture positive 07/27/2013    Past Surgical History:  Procedure Laterality Date  . CESAREAN SECTION    . CESAREAN SECTION WITH BILATERAL TUBAL LIGATION       OB History    Gravida  2   Para  2   Term  1   Preterm  1   AB  0   Living  2     SAB  0   TAB  0   Ectopic  0   Multiple  0   Live Births  2        Obstetric Comments  #1Failure to descend, #2 PPROM         Home Medications    Prior to Admission medications   Medication Sig Start Date End Date Taking? Authorizing Provider  albuterol (PROVENTIL HFA;VENTOLIN HFA) 108 (90 Base) MCG/ACT inhaler Inhale 2 puffs into the lungs every 6 (six) hours as needed for wheezing or shortness of breath. For asthma 01/20/16   Magrinat, Valentino HueGustav C, MD    buPROPion (WELLBUTRIN XL) 150 MG 24 hr tablet Take 1 tablet (150 mg total) by mouth daily. 01/20/16   Magrinat, Valentino HueGustav C, MD  cyanocobalamin (,VITAMIN B-12,) 1000 MCG/ML injection Inject 1 mL (1,000 mcg total) into the muscle every 30 (thirty) days. 12/19/13   Dorothea OgleMyers, Iskra M, MD    Family History Family History  Adopted: Yes    Social History Social History   Tobacco Use  . Smoking status: Never Smoker  . Smokeless tobacco: Never Used  Substance Use Topics  . Alcohol use: No    Alcohol/week: 1.2 oz    Types: 2 Standard drinks or equivalent per week  . Drug use: No     Allergies   Peanut-containing drug products and Shellfish allergy   Review of Systems Review of Systems  Skin: Negative for wound.     Physical Exam Updated Vital Signs BP (!) 118/97 (BP Location: Right Arm)   Pulse 64   Temp 98 F (36.7 C) (Oral)   Resp 18   Ht 5\' 2"  (1.575 m)   Wt 87.5 kg (193 lb)   SpO2 99%  BMI 35.30 kg/m   Physical Exam  Constitutional: She appears well-developed.  HENT:  Head: Atraumatic.  Pulmonary/Chest: Breath sounds normal.  Musculoskeletal:       Left hand: She exhibits normal range of motion, no tenderness, normal capillary refill, no laceration and no swelling.  Skin: Skin is warm, dry and intact.     ED Treatments / Results  Labs (all labs ordered are listed, but only abnormal results are displayed) Labs Reviewed - No data to display  EKG None  Radiology No results found.  Procedures Procedures (including critical care time)  Medications Ordered in ED Medications - No data to display   Initial Impression / Assessment and Plan / ED Course  I have reviewed the triage vital signs and the nursing notes.  Pertinent labs & imaging results that were available during my care of the patient were reviewed by me and considered in my medical decision making (see chart for details).     Patient presented with ring stuck on her left middle finger.   There was no injury.  Ring was removed by nursing staff, examination after removal of ring reveals no residual swelling, no wounds, no signs of infection.  Final Clinical Impressions(s) / ED Diagnoses   Final diagnoses:  Swollen finger    ED Discharge Orders    None       Gilda Crease, MD 07/13/17 2357

## 2017-07-13 NOTE — ED Notes (Signed)
Bed: WTR5 Expected date:  Expected time:  Means of arrival:  Comments: 

## 2017-07-13 NOTE — ED Triage Notes (Signed)
Patient ring is stuck left hand middle finger. Patient finger is swollen.

## 2017-08-07 ENCOUNTER — Inpatient Hospital Stay: Payer: Medicare Other | Attending: Oncology

## 2017-09-04 ENCOUNTER — Inpatient Hospital Stay: Payer: Medicare Other | Attending: Oncology

## 2017-09-04 DIAGNOSIS — E538 Deficiency of other specified B group vitamins: Secondary | ICD-10-CM | POA: Diagnosis not present

## 2017-09-04 DIAGNOSIS — Z79899 Other long term (current) drug therapy: Secondary | ICD-10-CM | POA: Insufficient documentation

## 2017-09-04 DIAGNOSIS — D509 Iron deficiency anemia, unspecified: Secondary | ICD-10-CM | POA: Insufficient documentation

## 2017-09-04 DIAGNOSIS — E539 Vitamin B deficiency, unspecified: Secondary | ICD-10-CM

## 2017-09-04 DIAGNOSIS — G63 Polyneuropathy in diseases classified elsewhere: Secondary | ICD-10-CM

## 2017-09-04 DIAGNOSIS — D61818 Other pancytopenia: Secondary | ICD-10-CM

## 2017-09-04 DIAGNOSIS — R29898 Other symptoms and signs involving the musculoskeletal system: Secondary | ICD-10-CM

## 2017-09-04 DIAGNOSIS — G8220 Paraplegia, unspecified: Secondary | ICD-10-CM

## 2017-09-04 MED ORDER — CYANOCOBALAMIN 1000 MCG/ML IJ SOLN
1000.0000 ug | Freq: Once | INTRAMUSCULAR | Status: AC
  Administered 2017-09-04: 1000 ug via INTRAMUSCULAR

## 2017-09-04 NOTE — Patient Instructions (Signed)

## 2017-10-02 ENCOUNTER — Inpatient Hospital Stay: Payer: Medicare Other | Attending: Oncology

## 2017-10-30 ENCOUNTER — Inpatient Hospital Stay: Payer: Medicare Other | Attending: Oncology

## 2017-10-30 MED ORDER — CYANOCOBALAMIN 1000 MCG/ML IJ SOLN
INTRAMUSCULAR | Status: AC
  Filled 2017-10-30: qty 1

## 2017-11-27 ENCOUNTER — Inpatient Hospital Stay: Payer: Medicare Other

## 2017-11-27 MED ORDER — CYANOCOBALAMIN 1000 MCG/ML IJ SOLN
INTRAMUSCULAR | Status: AC
  Filled 2017-11-27: qty 1

## 2017-12-25 ENCOUNTER — Inpatient Hospital Stay: Payer: Medicare Other | Attending: Oncology

## 2018-01-28 ENCOUNTER — Other Ambulatory Visit: Payer: Self-pay

## 2018-01-28 DIAGNOSIS — D500 Iron deficiency anemia secondary to blood loss (chronic): Secondary | ICD-10-CM

## 2018-01-28 DIAGNOSIS — E538 Deficiency of other specified B group vitamins: Secondary | ICD-10-CM

## 2018-01-28 NOTE — Progress Notes (Signed)
ID: Jill Summers OB: 03-14-1985  MR#: [id number]  QIO#:[id number]#:[id number]  PCP: Jill PhiFunches, Josalyn, MD GYN:   SU:  OTHER MD:  CHIEF COMPLAINT: vit B12 anemia, subacute combined degeneration, iron deficiency  CURRENT THERAPY:  vit B12 supplementation, Feraheme as needed  HISTORY OF PRESENT ILLNESS: From the earlier summary:  Jill Summers is a right handed female with history of depression and bronchial asthma as well as recent cellulitis of the umbilicus and treated with Bactrim in September 2015 as well as recent MRSA PCR screen +2 months ago as well as previously untreated B-12 deficiency. Patient lives with children independent prior to admission and her mother has been assisting with care of the children recently. Admitted 12/16/2013 with bouts of dizziness over the past 2 weeks as well as lower strandy weakness and numbness. She denied any falls. No change in bowel or bladder. Cranial CT scan negative. MRI of thoracic lumbar and cervical spine showed multiple bilateral thoracic cord lesions in the lateral white matter most compatible with demyelinating disease of question etiology and workup ongoing as per neurology services and felt cord lesions most likely manifestations of B-12 deficiency. Partial visualization of lower cervical cord shows white matter lesions with full cervical spine films pending. MRI of the brain 12/18/2013 showed less than 10 nonspecific subcentimeter supratentorial white matter T2 hyperintensities in a atypical distribution for classic demyelination. Found to have severe pancytopenia with WBC 1.4, ANC 0.4, hemoglobin 5.5, MCV 101.9, platelets 37,000. Hematology service consulted (Dr. Darnelle CatalanMagrinat) She was transfused. Etiology is secondary to B-12 deficiency and currently maintained on supplementation with plan to follow-up with hematology services mostly for B-12 injections   INTERVAL HISTORY: Jill Summers returns today for follow-up of her vitamin B12 anemia, subacute  combined degeneration, and iron deficiency.   The patient continues on vitamin B12 supplementation.  She tolerates these well.  She is not currently on iron supplementation.  She is still menstruating regularly.   REVIEW OF SYSTEMS: Jill Summers states that her balance is off. She states that "she sees the wall, but still runs into it." She falls, particularly going up or down on steps. She does not want to utilize a cane this early in her life. The patient denies unusual headaches, visual changes, nausea, vomiting, or dizziness. There has been no unusual cough, phlegm production, or pleurisy. This been no change in bowel or bladder habits. The patient denies unexplained fatigue or unexplained weight loss, bleeding, rash, or fever. A detailed review of systems was otherwise noncontributory.    PAST MEDICAL HISTORY: Past Medical History:  Diagnosis Date  . Adopted   . Asthma    since baby  . Bacterial vaginosis   . Chlamydia   . Depression    At age 32  . MRSA infection   . Obesity   . Urinary tract infection     PAST SURGICAL HISTORY: Past Surgical History:  Procedure Laterality Date  . CESAREAN SECTION    . CESAREAN SECTION WITH BILATERAL TUBAL LIGATION      FAMILY HISTORY Family History  Adopted: Yes  Mother - hypertension, Father - diabetes   GYNECOLOGIC HISTORY:  Patient has regular menstrual periods. She is status post bilateral tubal ligation. 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 She lives with 387 y/o son Jill Summers(Birthday 01/2010). Mother Jill Summers lives next door--she is a retired [employer] SW-- with patient's  33 y/o child, Jill Summers.  ADVANCED DIRECTIVES: not in place    HEALTH MAINTENANCE: Social History   Tobacco Use  . Smoking status: Never Smoker  . Smokeless tobacco: Never Used  Substance Use Topics  . Alcohol use: No    Alcohol/week: 2.0  standard drinks    Types: 2 Standard drinks or equivalent per week  . Drug use: No    Allergies  Allergen Reactions  . Peanut-Containing Drug Products Anaphylaxis, Hives and Swelling    *Throat swelling*  . Shellfish Allergy Anaphylaxis, Hives and Swelling    Throat swelling    Current Outpatient Medications  Medication Sig Dispense Refill  . albuterol (PROVENTIL HFA;VENTOLIN HFA) 108 (90 Base) MCG/ACT inhaler Inhale 2 puffs into the lungs every 6 (six) hours as needed for wheezing or shortness of breath. For asthma 1 Inhaler 12  . buPROPion (WELLBUTRIN XL) 150 MG 24 hr tablet Take 1 tablet (150 mg total) by mouth daily. 90 tablet 4  . cyanocobalamin (,VITAMIN B-12,) 1000 MCG/ML injection Inject 1 mL (1,000 mcg total) into the muscle every 30 (thirty) days. 1 mL 1   No current facility-administered medications for this visit.     OBJECTIVE: Jill Summers who appears stated age Vitals:   01/29/18 0846  BP: 133/86  Pulse: 74  Resp: 17  Temp: 97.7 F (36.5 C)  SpO2: 100%     Body mass index is 35.1 kg/m.    ECOG FS:1 - Symptomatic but completely ambulatory  Sclerae unicteric, pupils round and equal Oropharynx clear and moist No cervical or supraclavicular adenopathy Lungs no rales or rhonchi Heart regular rate and rhythm Abd soft, nontender, positive bowel sounds MSK no focal spinal tenderness, no upper extremity lymphedema Neuro: nonfocal, well oriented, appropriate affect; significant imbalance with tandem walk Breasts: Deferred     LAB RESULTS:  CBC    Component Value Date/Time   WBC 4.5 01/29/2018 0831   WBC 5.5 02/19/2017 0826   RBC 4.28 01/29/2018 0831   HGB 12.8 01/29/2018 0831   HGB 12.3 01/20/2016 1404   HCT 38.9 01/29/2018 0831   HCT 37.1 01/20/2016 1404   PLT 204 01/29/2018 0831   PLT 249 01/20/2016 1404   MCV 90.9 01/29/2018 0831   MCV 85.5 01/20/2016 1404   MCH 29.9 01/29/2018 0831   MCHC 32.9 01/29/2018 0831   RDW 12.5  01/29/2018 0831   RDW 14.1 01/20/2016 1404   LYMPHSABS 1.7 01/29/2018 0831   LYMPHSABS 2.8 01/20/2016 1404   MONOABS 0.3 01/29/2018 0831   MONOABS 0.2 01/20/2016 1404   EOSABS 0.1 01/29/2018 0831   EOSABS 0.1 01/20/2016 1404   BASOSABS 0.0 01/29/2018 0831   BASOSABS 0.0 01/20/2016 1404   CMP     Component Value Date/Time   NA 140 02/04/2015 1049   K 3.9 02/04/2015 1049   CL 106 12/24/2013 0718   CO2 23 02/04/2015 1049   GLUCOSE 98 02/04/2015 1049   BUN 9.8 02/04/2015 1049   CREATININE 0.9 02/04/2015 1049   CALCIUM 8.8 02/04/2015 1049   PROT 6.9 02/04/2015 1049   ALBUMIN 3.4 (L) 02/04/2015 1049   AST 13 02/04/2015 1049   ALT <9 02/04/2015 1049   ALKPHOS 53 02/04/2015 1049   BILITOT 0.47 02/04/2015 1049   GFRNONAA >90 12/24/2013 0718   GFRAA >90 12/24/2013 0718        Component Value Date/Time   COLORURINE AMBER (A) 12/16/2013 0707   APPEARANCEUR HAZY (A) 12/16/2013 0707   LABSPEC 1.025 12/16/2013 [id number]  PHURINE 5.0 12/16/2013 0707   GLUCOSEU NEGATIVE 12/16/2013 0707   HGBUR LARGE (A) 12/16/2013 0707   BILIRUBINUR SMALL (A) 12/16/2013 0707   KETONESUR NEGATIVE 12/16/2013 0707   PROTEINUR NEGATIVE 12/16/2013 0707   UROBILINOGEN >8.0 (H) 12/16/2013 0707   NITRITE NEGATIVE 12/16/2013 0707   LEUKOCYTESUR SMALL (A) 12/16/2013 0707   Iron/TIBC/Ferritin/ %Sat    Component Value Date/Time   IRON 113 12/15/2013 2108   TIBC 207 (L) 12/15/2013 2108   FERRITIN 5 (L) 02/19/2017 0825   FERRITIN 9 12/09/2015 1037   IRONPCTSAT 55 12/15/2013 2108    STUDIES: Repeat B12 and ferritin studies pending  ASSESSMENT: 32 y.o. [place] Summers presenting 11/15/2013 with inability to walk, fever and pancytopenia, found to be severely B-12 deficient  (1) B-12 deficiency: will need lifelong replacement; as patient does not have PCP this can be done through our office (a) received montlhy  (2) subacute combined degeneration - Stable  (3) iron deficiency: ferritin level  on 02/04/15 was 6. Feraheme given on 03/11/15 and 03/19/15.   (a) iron level less than 10 on 01/20/2016. Feraheme given 02/24/2016  PLAN:  Teyana is neurologically very stable.  We had placed a neurologist referral a year ago but for some reason that did not happen.  We are repeating that at this point I just to make sure we are not missing anything.  We have iron studies pending today.  She is not on oral iron supplementation.  We discussed the fact that menstruating women frequently are not deficient and she is going to start ferrous sulfate daily.  She will let me know if she has any problems from that.  Otherwise she will continue her monthly B12 doses here and she will see me again in October  She knows to call for any other issues that may develop before the next visit here.  Magrinat, Valentino Hue, MD  01/29/18 8:55 AM Medical Oncology and Hematology. [facility] [address] [place], [place] [address] Tel. [phone]    Fax. [phone]  I, Ruthann Cancer MD, have reviewed the above documentation for accuracy and completeness, and I agree with the above.

## 2018-01-29 ENCOUNTER — Inpatient Hospital Stay: Payer: Medicare Other

## 2018-01-29 ENCOUNTER — Encounter: Payer: Self-pay | Admitting: Oncology

## 2018-01-29 ENCOUNTER — Inpatient Hospital Stay (HOSPITAL_BASED_OUTPATIENT_CLINIC_OR_DEPARTMENT_OTHER): Payer: Medicare Other | Admitting: Oncology

## 2018-01-29 ENCOUNTER — Inpatient Hospital Stay: Payer: Medicare Other | Attending: Oncology

## 2018-01-29 ENCOUNTER — Telehealth: Payer: Self-pay | Admitting: Oncology

## 2018-01-29 VITALS — BP 133/86 | HR 74 | Temp 97.7°F | Resp 17 | Ht 62.0 in | Wt 191.9 lb

## 2018-01-29 DIAGNOSIS — E539 Vitamin B deficiency, unspecified: Secondary | ICD-10-CM

## 2018-01-29 DIAGNOSIS — D61818 Other pancytopenia: Secondary | ICD-10-CM

## 2018-01-29 DIAGNOSIS — R29898 Other symptoms and signs involving the musculoskeletal system: Secondary | ICD-10-CM

## 2018-01-29 DIAGNOSIS — G8220 Paraplegia, unspecified: Secondary | ICD-10-CM

## 2018-01-29 DIAGNOSIS — G320 Subacute combined degeneration of spinal cord in diseases classified elsewhere: Secondary | ICD-10-CM

## 2018-01-29 DIAGNOSIS — D509 Iron deficiency anemia, unspecified: Secondary | ICD-10-CM | POA: Diagnosis not present

## 2018-01-29 DIAGNOSIS — E538 Deficiency of other specified B group vitamins: Secondary | ICD-10-CM

## 2018-01-29 DIAGNOSIS — G63 Polyneuropathy in diseases classified elsewhere: Secondary | ICD-10-CM

## 2018-01-29 DIAGNOSIS — D500 Iron deficiency anemia secondary to blood loss (chronic): Secondary | ICD-10-CM

## 2018-01-29 LAB — CBC WITH DIFFERENTIAL (CANCER CENTER ONLY)
Abs Immature Granulocytes: 0.01 10*3/uL (ref 0.00–0.07)
Basophils Absolute: 0 10*3/uL (ref 0.0–0.1)
Basophils Relative: 0 %
Eosinophils Absolute: 0.1 10*3/uL (ref 0.0–0.5)
Eosinophils Relative: 1 %
HCT: 38.9 % (ref 36.0–46.0)
Hemoglobin: 12.8 g/dL (ref 12.0–15.0)
Immature Granulocytes: 0 %
Lymphocytes Relative: 38 %
Lymphs Abs: 1.7 10*3/uL (ref 0.7–4.0)
MCH: 29.9 pg (ref 26.0–34.0)
MCHC: 32.9 g/dL (ref 30.0–36.0)
MCV: 90.9 fL (ref 80.0–100.0)
Monocytes Absolute: 0.3 10*3/uL (ref 0.1–1.0)
Monocytes Relative: 6 %
Neutro Abs: 2.5 10*3/uL (ref 1.7–7.7)
Neutrophils Relative %: 55 %
Platelet Count: 204 10*3/uL (ref 150–400)
RBC: 4.28 MIL/uL (ref 3.87–5.11)
RDW: 12.5 % (ref 11.5–15.5)
WBC Count: 4.5 10*3/uL (ref 4.0–10.5)
nRBC: 0 % (ref 0.0–0.2)

## 2018-01-29 LAB — CMP (CANCER CENTER ONLY)
ALT: 9 U/L (ref 0–44)
AST: 12 U/L — ABNORMAL LOW (ref 15–41)
Albumin: 3.4 g/dL — ABNORMAL LOW (ref 3.5–5.0)
Alkaline Phosphatase: 44 U/L (ref 38–126)
Anion gap: 8 (ref 5–15)
BUN: 10 mg/dL (ref 6–20)
CO2: 22 mmol/L (ref 22–32)
Calcium: 8.7 mg/dL — ABNORMAL LOW (ref 8.9–10.3)
Chloride: 111 mmol/L (ref 98–111)
Creatinine: 0.85 mg/dL (ref 0.44–1.00)
GFR, Est AFR Am: >60 mL/min (ref >60)
GFR, Estimated: >60 mL/min (ref >60)
Glucose, Bld: 91 mg/dL (ref 70–99)
Potassium: 3.9 mmol/L (ref 3.5–5.1)
Sodium: 141 mmol/L (ref 135–145)
Total Bilirubin: 0.6 mg/dL (ref 0.3–1.2)
Total Protein: 6.6 g/dL (ref 6.5–8.1)

## 2018-01-29 LAB — FERRITIN: Ferritin: 10 ng/mL — ABNORMAL LOW (ref 11–307)

## 2018-01-29 LAB — VITAMIN B12: Vitamin B-12: 110 pg/mL — ABNORMAL LOW (ref 180–914)

## 2018-01-29 MED ORDER — CYANOCOBALAMIN 1000 MCG/ML IJ SOLN
1000.0000 ug | Freq: Once | INTRAMUSCULAR | Status: AC
  Administered 2018-01-29: 1000 ug via INTRAMUSCULAR

## 2018-01-29 NOTE — Telephone Encounter (Signed)
Gave avs and calendar ° °

## 2018-02-01 ENCOUNTER — Other Ambulatory Visit: Payer: Self-pay | Admitting: Oncology

## 2018-02-01 ENCOUNTER — Encounter: Payer: Self-pay | Admitting: Oncology

## 2018-02-01 DIAGNOSIS — E538 Deficiency of other specified B group vitamins: Secondary | ICD-10-CM

## 2018-02-01 DIAGNOSIS — D508 Other iron deficiency anemias: Secondary | ICD-10-CM

## 2018-02-05 ENCOUNTER — Telehealth: Payer: Self-pay | Admitting: Oncology

## 2018-02-05 NOTE — Telephone Encounter (Signed)
Called to confirm appt per 1/3 sch message - pt is aware of appts

## 2018-02-26 ENCOUNTER — Inpatient Hospital Stay: Payer: Medicare Other | Attending: Oncology

## 2018-02-26 ENCOUNTER — Ambulatory Visit: Payer: Medicare Other

## 2018-02-26 VITALS — BP 108/75 | HR 62 | Temp 98.3°F | Resp 18

## 2018-02-26 DIAGNOSIS — E538 Deficiency of other specified B group vitamins: Secondary | ICD-10-CM

## 2018-02-26 DIAGNOSIS — G63 Polyneuropathy in diseases classified elsewhere: Secondary | ICD-10-CM

## 2018-02-26 DIAGNOSIS — Z79899 Other long term (current) drug therapy: Secondary | ICD-10-CM | POA: Diagnosis not present

## 2018-02-26 DIAGNOSIS — D509 Iron deficiency anemia, unspecified: Secondary | ICD-10-CM | POA: Insufficient documentation

## 2018-02-26 DIAGNOSIS — D61818 Other pancytopenia: Secondary | ICD-10-CM

## 2018-02-26 DIAGNOSIS — R29898 Other symptoms and signs involving the musculoskeletal system: Secondary | ICD-10-CM

## 2018-02-26 DIAGNOSIS — E539 Vitamin B deficiency, unspecified: Secondary | ICD-10-CM

## 2018-02-26 DIAGNOSIS — G8220 Paraplegia, unspecified: Secondary | ICD-10-CM

## 2018-02-26 MED ORDER — CYANOCOBALAMIN 1000 MCG/ML IJ SOLN
1000.0000 ug | Freq: Once | INTRAMUSCULAR | Status: AC
  Administered 2018-02-26: 1000 ug via INTRAMUSCULAR

## 2018-02-26 MED ORDER — SODIUM CHLORIDE 0.9 % IV SOLN
510.0000 mg | Freq: Once | INTRAVENOUS | Status: AC
  Administered 2018-02-26: 510 mg via INTRAVENOUS
  Filled 2018-02-26: qty 17

## 2018-02-26 MED ORDER — CYANOCOBALAMIN 1000 MCG/ML IJ SOLN
INTRAMUSCULAR | Status: AC
  Filled 2018-02-26: qty 1

## 2018-02-27 ENCOUNTER — Telehealth: Payer: Self-pay

## 2018-02-27 NOTE — Telephone Encounter (Signed)
I contacted the pt and advised Dr. Anne Hahn had a opening become available for tomorrow at 7:30. Pt is currently on the wait list for a sooner new pt appt. Pt declined appt for tomorrow and will remain on wait list.

## 2018-03-26 ENCOUNTER — Ambulatory Visit: Payer: Medicare Other

## 2018-03-26 ENCOUNTER — Inpatient Hospital Stay: Payer: Medicare Other | Attending: Oncology

## 2018-04-01 ENCOUNTER — Ambulatory Visit: Payer: Medicare Other | Admitting: Neurology

## 2018-04-23 ENCOUNTER — Inpatient Hospital Stay: Payer: Medicare Other | Attending: Oncology

## 2018-04-23 ENCOUNTER — Other Ambulatory Visit: Payer: Self-pay

## 2018-04-23 VITALS — BP 115/70 | HR 66 | Temp 98.1°F | Resp 18

## 2018-04-23 DIAGNOSIS — D509 Iron deficiency anemia, unspecified: Secondary | ICD-10-CM | POA: Insufficient documentation

## 2018-04-23 DIAGNOSIS — E538 Deficiency of other specified B group vitamins: Secondary | ICD-10-CM

## 2018-04-23 DIAGNOSIS — Z79899 Other long term (current) drug therapy: Secondary | ICD-10-CM | POA: Insufficient documentation

## 2018-04-23 DIAGNOSIS — E539 Vitamin B deficiency, unspecified: Secondary | ICD-10-CM

## 2018-04-23 DIAGNOSIS — R29898 Other symptoms and signs involving the musculoskeletal system: Secondary | ICD-10-CM

## 2018-04-23 DIAGNOSIS — G8220 Paraplegia, unspecified: Secondary | ICD-10-CM

## 2018-04-23 DIAGNOSIS — G63 Polyneuropathy in diseases classified elsewhere: Secondary | ICD-10-CM

## 2018-04-23 DIAGNOSIS — D61818 Other pancytopenia: Secondary | ICD-10-CM

## 2018-04-23 MED ORDER — CYANOCOBALAMIN 1000 MCG/ML IJ SOLN
1000.0000 ug | Freq: Once | INTRAMUSCULAR | Status: AC
  Administered 2018-04-23: 1000 ug via INTRAMUSCULAR

## 2018-04-23 MED ORDER — CYANOCOBALAMIN 1000 MCG/ML IJ SOLN
INTRAMUSCULAR | Status: AC
  Filled 2018-04-23: qty 1

## 2018-04-25 ENCOUNTER — Telehealth: Payer: Self-pay

## 2018-04-25 NOTE — Telephone Encounter (Signed)
I contacted the pt and left a vm advising her to call back in regards to her 9:30 am appt on 04/29/18 with Dr. Anne Hahn.

## 2018-04-29 ENCOUNTER — Ambulatory Visit: Payer: Medicare Other | Admitting: Neurology

## 2018-05-21 ENCOUNTER — Inpatient Hospital Stay: Payer: Medicare Other | Attending: Oncology

## 2018-05-21 ENCOUNTER — Other Ambulatory Visit: Payer: Self-pay

## 2018-05-21 DIAGNOSIS — E539 Vitamin B deficiency, unspecified: Secondary | ICD-10-CM

## 2018-05-21 DIAGNOSIS — D61818 Other pancytopenia: Secondary | ICD-10-CM

## 2018-05-21 DIAGNOSIS — G63 Polyneuropathy in diseases classified elsewhere: Secondary | ICD-10-CM

## 2018-05-21 DIAGNOSIS — R29898 Other symptoms and signs involving the musculoskeletal system: Secondary | ICD-10-CM

## 2018-05-21 DIAGNOSIS — E538 Deficiency of other specified B group vitamins: Secondary | ICD-10-CM

## 2018-05-21 DIAGNOSIS — D509 Iron deficiency anemia, unspecified: Secondary | ICD-10-CM | POA: Diagnosis not present

## 2018-05-21 DIAGNOSIS — G8220 Paraplegia, unspecified: Secondary | ICD-10-CM

## 2018-05-21 DIAGNOSIS — Z79899 Other long term (current) drug therapy: Secondary | ICD-10-CM | POA: Diagnosis not present

## 2018-05-21 MED ORDER — CYANOCOBALAMIN 1000 MCG/ML IJ SOLN
1000.0000 ug | Freq: Once | INTRAMUSCULAR | Status: AC
  Administered 2018-05-21: 1000 ug via INTRAMUSCULAR

## 2018-05-21 MED ORDER — CYANOCOBALAMIN 1000 MCG/ML IJ SOLN
INTRAMUSCULAR | Status: AC
  Filled 2018-05-21: qty 1

## 2018-05-21 NOTE — Patient Instructions (Signed)

## 2018-06-17 DIAGNOSIS — N921 Excessive and frequent menstruation with irregular cycle: Secondary | ICD-10-CM | POA: Diagnosis not present

## 2018-06-17 DIAGNOSIS — Z7901 Long term (current) use of anticoagulants: Secondary | ICD-10-CM | POA: Diagnosis not present

## 2018-06-18 ENCOUNTER — Inpatient Hospital Stay: Payer: Medicare Other | Attending: Oncology

## 2018-06-26 DIAGNOSIS — B9689 Other specified bacterial agents as the cause of diseases classified elsewhere: Secondary | ICD-10-CM | POA: Diagnosis not present

## 2018-06-26 DIAGNOSIS — N921 Excessive and frequent menstruation with irregular cycle: Secondary | ICD-10-CM | POA: Diagnosis not present

## 2018-06-26 DIAGNOSIS — Z7251 High risk heterosexual behavior: Secondary | ICD-10-CM | POA: Diagnosis not present

## 2018-06-26 DIAGNOSIS — Z01419 Encounter for gynecological examination (general) (routine) without abnormal findings: Secondary | ICD-10-CM | POA: Diagnosis not present

## 2018-06-26 DIAGNOSIS — N760 Acute vaginitis: Secondary | ICD-10-CM | POA: Diagnosis not present

## 2018-07-11 ENCOUNTER — Telehealth: Payer: Self-pay

## 2018-07-11 NOTE — Telephone Encounter (Signed)
I contacted the pt and left a vm on her mobile/home # advising we needed to reschedule her 07/12/18 appt due to office hours temp changing to Monday-Thursday due to covid 19.  Pt was advised to call back so we could reschedule. Office # provided.

## 2018-07-12 ENCOUNTER — Ambulatory Visit: Payer: Medicare Other | Admitting: Neurology

## 2018-07-16 ENCOUNTER — Inpatient Hospital Stay: Payer: Medicare Other | Attending: Oncology

## 2018-07-16 ENCOUNTER — Ambulatory Visit: Payer: Medicare Other

## 2018-08-13 ENCOUNTER — Inpatient Hospital Stay: Payer: Medicare Other | Attending: Oncology

## 2018-08-13 ENCOUNTER — Other Ambulatory Visit: Payer: Self-pay

## 2018-08-13 ENCOUNTER — Ambulatory Visit: Payer: Medicare Other

## 2018-08-13 DIAGNOSIS — D61818 Other pancytopenia: Secondary | ICD-10-CM

## 2018-08-13 DIAGNOSIS — D509 Iron deficiency anemia, unspecified: Secondary | ICD-10-CM | POA: Diagnosis not present

## 2018-08-13 DIAGNOSIS — Z79899 Other long term (current) drug therapy: Secondary | ICD-10-CM | POA: Insufficient documentation

## 2018-08-13 DIAGNOSIS — E539 Vitamin B deficiency, unspecified: Secondary | ICD-10-CM

## 2018-08-13 DIAGNOSIS — R29898 Other symptoms and signs involving the musculoskeletal system: Secondary | ICD-10-CM

## 2018-08-13 DIAGNOSIS — E538 Deficiency of other specified B group vitamins: Secondary | ICD-10-CM | POA: Diagnosis not present

## 2018-08-13 DIAGNOSIS — G8220 Paraplegia, unspecified: Secondary | ICD-10-CM

## 2018-08-13 MED ORDER — CYANOCOBALAMIN 1000 MCG/ML IJ SOLN
INTRAMUSCULAR | Status: AC
  Filled 2018-08-13: qty 1

## 2018-08-13 MED ORDER — CYANOCOBALAMIN 1000 MCG/ML IJ SOLN
1000.0000 ug | Freq: Once | INTRAMUSCULAR | Status: AC
  Administered 2018-08-13: 1000 ug via INTRAMUSCULAR

## 2018-08-28 ENCOUNTER — Ambulatory Visit: Payer: Medicare Other | Admitting: Neurology

## 2018-08-28 ENCOUNTER — Telehealth: Payer: Self-pay | Admitting: Neurology

## 2018-08-28 NOTE — Telephone Encounter (Signed)
This patient did not show for a new patient appointment today. 

## 2018-09-10 ENCOUNTER — Inpatient Hospital Stay: Payer: Medicare Other | Attending: Oncology

## 2018-09-10 ENCOUNTER — Ambulatory Visit: Payer: Medicare Other

## 2018-10-08 ENCOUNTER — Ambulatory Visit: Payer: Medicare Other

## 2018-10-08 ENCOUNTER — Other Ambulatory Visit: Payer: Self-pay

## 2018-10-08 ENCOUNTER — Inpatient Hospital Stay: Payer: Medicare Other | Attending: Oncology

## 2018-10-08 VITALS — BP 122/77 | HR 72 | Temp 98.7°F | Resp 18

## 2018-10-08 DIAGNOSIS — D61818 Other pancytopenia: Secondary | ICD-10-CM

## 2018-10-08 DIAGNOSIS — R29898 Other symptoms and signs involving the musculoskeletal system: Secondary | ICD-10-CM

## 2018-10-08 DIAGNOSIS — E538 Deficiency of other specified B group vitamins: Secondary | ICD-10-CM

## 2018-10-08 DIAGNOSIS — D519 Vitamin B12 deficiency anemia, unspecified: Secondary | ICD-10-CM | POA: Diagnosis not present

## 2018-10-08 DIAGNOSIS — G8220 Paraplegia, unspecified: Secondary | ICD-10-CM

## 2018-10-08 DIAGNOSIS — E539 Vitamin B deficiency, unspecified: Secondary | ICD-10-CM

## 2018-10-08 DIAGNOSIS — G63 Polyneuropathy in diseases classified elsewhere: Secondary | ICD-10-CM

## 2018-10-08 MED ORDER — CYANOCOBALAMIN 1000 MCG/ML IJ SOLN
INTRAMUSCULAR | Status: AC
  Filled 2018-10-08: qty 1

## 2018-10-08 MED ORDER — CYANOCOBALAMIN 1000 MCG/ML IJ SOLN
1000.0000 ug | Freq: Once | INTRAMUSCULAR | Status: AC
  Administered 2018-10-08: 1000 ug via INTRAMUSCULAR

## 2018-10-29 DIAGNOSIS — L299 Pruritus, unspecified: Secondary | ICD-10-CM | POA: Diagnosis not present

## 2018-10-29 DIAGNOSIS — L509 Urticaria, unspecified: Secondary | ICD-10-CM | POA: Diagnosis not present

## 2018-11-05 ENCOUNTER — Inpatient Hospital Stay: Payer: Medicare Other

## 2018-11-05 ENCOUNTER — Other Ambulatory Visit: Payer: Self-pay | Admitting: Oncology

## 2018-11-05 ENCOUNTER — Other Ambulatory Visit: Payer: Self-pay

## 2018-11-05 ENCOUNTER — Inpatient Hospital Stay (HOSPITAL_BASED_OUTPATIENT_CLINIC_OR_DEPARTMENT_OTHER): Payer: Medicare Other | Admitting: Oncology

## 2018-11-05 ENCOUNTER — Inpatient Hospital Stay: Payer: Medicare Other | Attending: Oncology

## 2018-11-05 ENCOUNTER — Ambulatory Visit: Payer: Medicare Other

## 2018-11-05 VITALS — BP 127/82 | HR 64 | Temp 98.5°F | Resp 18 | Wt 190.6 lb

## 2018-11-05 DIAGNOSIS — D509 Iron deficiency anemia, unspecified: Secondary | ICD-10-CM | POA: Diagnosis not present

## 2018-11-05 DIAGNOSIS — G320 Subacute combined degeneration of spinal cord in diseases classified elsewhere: Secondary | ICD-10-CM

## 2018-11-05 DIAGNOSIS — E538 Deficiency of other specified B group vitamins: Secondary | ICD-10-CM | POA: Insufficient documentation

## 2018-11-05 DIAGNOSIS — D61818 Other pancytopenia: Secondary | ICD-10-CM

## 2018-11-05 DIAGNOSIS — R29898 Other symptoms and signs involving the musculoskeletal system: Secondary | ICD-10-CM

## 2018-11-05 DIAGNOSIS — E539 Vitamin B deficiency, unspecified: Secondary | ICD-10-CM

## 2018-11-05 DIAGNOSIS — G8220 Paraplegia, unspecified: Secondary | ICD-10-CM

## 2018-11-05 LAB — COMPREHENSIVE METABOLIC PANEL
ALT: 8 U/L (ref 0–44)
AST: 10 U/L — ABNORMAL LOW (ref 15–41)
Albumin: 3.3 g/dL — ABNORMAL LOW (ref 3.5–5.0)
Alkaline Phosphatase: 38 U/L (ref 38–126)
Anion gap: 6 (ref 5–15)
BUN: 12 mg/dL (ref 6–20)
CO2: 27 mmol/L (ref 22–32)
Calcium: 8.3 mg/dL — ABNORMAL LOW (ref 8.9–10.3)
Chloride: 107 mmol/L (ref 98–111)
Creatinine, Ser: 0.91 mg/dL (ref 0.44–1.00)
GFR calc Af Amer: >60 mL/min (ref >60)
GFR calc non Af Amer: >60 mL/min (ref >60)
Glucose, Bld: 92 mg/dL (ref 70–99)
Potassium: 3.7 mmol/L (ref 3.5–5.1)
Sodium: 140 mmol/L (ref 135–145)
Total Bilirubin: 0.3 mg/dL (ref 0.3–1.2)
Total Protein: 6.3 g/dL — ABNORMAL LOW (ref 6.5–8.1)

## 2018-11-05 LAB — CBC WITH DIFFERENTIAL/PLATELET
Abs Immature Granulocytes: 0.04 10*3/uL (ref 0.00–0.07)
Basophils Absolute: 0 10*3/uL (ref 0.0–0.1)
Basophils Relative: 0 %
Eosinophils Absolute: 0.1 10*3/uL (ref 0.0–0.5)
Eosinophils Relative: 1 %
HCT: 37.4 % (ref 36.0–46.0)
Hemoglobin: 12.5 g/dL (ref 12.0–15.0)
Immature Granulocytes: 0 %
Lymphocytes Relative: 30 %
Lymphs Abs: 3.2 10*3/uL (ref 0.7–4.0)
MCH: 31.3 pg (ref 26.0–34.0)
MCHC: 33.4 g/dL (ref 30.0–36.0)
MCV: 93.5 fL (ref 80.0–100.0)
Monocytes Absolute: 0.6 10*3/uL (ref 0.1–1.0)
Monocytes Relative: 6 %
Neutro Abs: 6.8 10*3/uL (ref 1.7–7.7)
Neutrophils Relative %: 63 %
Platelets: 255 10*3/uL (ref 150–400)
RBC: 4 MIL/uL (ref 3.87–5.11)
RDW: 13.1 % (ref 11.5–15.5)
WBC: 10.6 10*3/uL — ABNORMAL HIGH (ref 4.0–10.5)
nRBC: 0 % (ref 0.0–0.2)

## 2018-11-05 LAB — VITAMIN B12: Vitamin B-12: 130 pg/mL — ABNORMAL LOW (ref 180–914)

## 2018-11-05 LAB — FERRITIN: Ferritin: 6 ng/mL — ABNORMAL LOW (ref 11–307)

## 2018-11-05 MED ORDER — CYANOCOBALAMIN 1000 MCG/ML IJ SOLN
1000.0000 ug | Freq: Once | INTRAMUSCULAR | Status: AC
  Administered 2018-11-05: 1000 ug via INTRAMUSCULAR

## 2018-11-05 NOTE — Progress Notes (Signed)
Jill Summers's ferritin is again under 10.  We are going to set her up for iron infusion next week and the following.

## 2018-11-05 NOTE — Progress Notes (Signed)
[facility]  Telephone:([phone] Fax:(336) [phone]    ID: Jill Summers OB: 26-Mar-1985  MR#: [id number]  JYN#:[id number]#:[id number]  PCP: Dessa PhiFunches, Josalyn, MD GYN:   SU:  OTHER MD:  CHIEF COMPLAINT: vit B12 anemia, subacute combined degeneration, iron deficiency  CURRENT THERAPY:  vit B12 supplementation, Feraheme as needed   HISTORY OF PRESENT ILLNESS: From the earlier summary:  Jill Summers is a right handed female with history of depression and bronchial asthma as well as recent cellulitis of the umbilicus and treated with Bactrim in September 2015 as well as recent MRSA PCR screen +2 months ago as well as previously untreated B-12 deficiency. Patient lives with children independent prior to admission and her mother has been assisting with care of the children recently. Admitted 12/16/2013 with bouts of dizziness over the past 2 weeks as well as lower strandy weakness and numbness. She denied any falls. No change in bowel or bladder. Cranial CT scan negative. MRI of thoracic lumbar and cervical spine showed multiple bilateral thoracic cord lesions in the lateral white matter most compatible with demyelinating disease of question etiology and workup ongoing as per neurology services and felt cord lesions most likely manifestations of B-12 deficiency. Partial visualization of lower cervical cord shows white matter lesions with full cervical spine films pending. MRI of the brain 12/18/2013 showed less than 10 nonspecific subcentimeter supratentorial white matter T2 hyperintensities in a atypical distribution for classic demyelination. Found to have severe pancytopenia with WBC 1.4, ANC 0.4, hemoglobin 5.5, MCV 101.9, platelets 37,000. Hematology service consulted (Dr. Darnelle CatalanMagrinat) She was transfused. Etiology is secondary to B-12 deficiency and currently maintained on supplementation with plan to follow-up with hematology services mostly for B-12 injections    INTERVAL HISTORY:  Jill Summers returns today for follow-up and treatment of her vitamin B12 anemia, subacute combined degeneration, and iron deficiency. She was last seen here on 01/29/2018.   She continues on Vitamin B12 injections.  She tolerates these with no side effects that she is aware of  She tells me she just had her Pap smear.  Also she developed some hives and has been referred to an allergist.    REVIEW OF SYSTEMS: Jill Summers is not currently working.  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  She is now pretty much staying at home with her 34-year-old son who is cooling virtually.  They are being careful regarding the pandemic.  Her symptoms from subacute combined degeneration have completely resolved and she is not aware of any peripheral neuropathy symptoms or motor weakness.  A detailed review of systems today was otherwise stable    PAST MEDICAL HISTORY: Past Medical History:  Diagnosis Date  . Adopted   . Asthma    since baby  . Bacterial vaginosis   . Chlamydia   . Depression    At age 33  . MRSA infection   . Obesity   . Urinary tract infection     PAST SURGICAL HISTORY: Past Surgical History:  Procedure Laterality Date  . CESAREAN SECTION    . CESAREAN SECTION WITH BILATERAL TUBAL LIGATION      FAMILY HISTORY Family History  Adopted: Yes  Mother - hypertension, Father - diabetes   GYNECOLOGIC HISTORY:  Patient has regular menstrual periods. She is status post bilateral tubal ligation. 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  She lives with 22 y/o son Jill Lion Rudene Anda 01/2010). Mother Jill Summers lives next door--she is a retired Hydrologist SW-- with patient's 43 y/o child, Jill Summers.  ADVANCED DIRECTIVES: not in place    HEALTH MAINTENANCE: Social History   Tobacco Use  . Smoking status: Never Smoker  . Smokeless tobacco: Never Used   Substance Use Topics  . Alcohol use: No    Alcohol/week: 2.0 standard drinks    Types: 2 Standard drinks or equivalent per week  . Drug use: No    Allergies  Allergen Reactions  . Peanut-Containing Drug Products Anaphylaxis, Hives and Swelling    *Throat swelling*  . Shellfish Allergy Anaphylaxis, Hives and Swelling    Throat swelling    Current Outpatient Medications  Medication Sig Dispense Refill  . albuterol (PROVENTIL HFA;VENTOLIN HFA) 108 (90 Base) MCG/ACT inhaler Inhale 2 puffs into the lungs every 6 (six) hours as needed for wheezing or shortness of breath. For asthma 1 Inhaler 12  . buPROPion (WELLBUTRIN XL) 150 MG 24 hr tablet Take 1 tablet (150 mg total) by mouth daily. 90 tablet 4  . cyanocobalamin (,VITAMIN B-12,) 1000 MCG/ML injection Inject 1 mL (1,000 mcg total) into the muscle every 30 (thirty) days. 1 mL 1   No current facility-administered medications for this visit.     OBJECTIVE: Young African-American woman in no acute distress  Vitals:   11/05/18 1106  BP: 127/82  Pulse: 64  Resp: 18  Temp: 98.5 F (36.9 C)  SpO2: 100%   Wt Readings from Last 3 Encounters:  11/05/18 190 lb 9.6 oz (86.5 kg)  01/29/18 191 lb 14.4 oz (87 kg)  07/13/17 193 lb (87.5 kg)   Body mass index is 34.86 kg/m.    ECOG FS:1 - Symptomatic but completely ambulatory  Ocular: Sclerae unicteric, pupils round and equal Ear-nose-throat: Wearing a mask Lymphatic: No cervical or supraclavicular adenopathy Lungs no rales or rhonchi Heart regular rate and rhythm Abd soft, nontender, positive bowel sounds MSK no focal spinal tenderness, no joint edema Neuro: non-focal, well-oriented, appropriate affect Breasts: Deferred   LAB RESULTS:  CBC    Component Value Date/Time   WBC 10.6 (H) 11/05/2018 1036   RBC 4.00 11/05/2018 1036   HGB 12.5 11/05/2018 1036   HGB 12.8 01/29/2018 0831   HGB 12.3 01/20/2016 1404   HCT 37.4 11/05/2018 1036   HCT 37.1 01/20/2016 1404   PLT 255  11/05/2018 1036   PLT 204 01/29/2018 0831   PLT 249 01/20/2016 1404   MCV 93.5 11/05/2018 1036   MCV 85.5 01/20/2016 1404   MCH 31.3 11/05/2018 1036   MCHC 33.4 11/05/2018 1036   RDW 13.1 11/05/2018 1036   RDW 14.1 01/20/2016 1404   LYMPHSABS 3.2 11/05/2018 1036   LYMPHSABS 2.8 01/20/2016 1404   MONOABS 0.6 11/05/2018 1036   MONOABS 0.2 01/20/2016 1404   EOSABS 0.1 11/05/2018 1036   EOSABS 0.1 01/20/2016 1404   BASOSABS 0.0 11/05/2018 1036   BASOSABS 0.0 01/20/2016 1404   CMP     Component Value Date/Time   NA 141 01/29/2018 0831   NA 140 02/04/2015 1049   K 3.9 01/29/2018 0831   K 3.9 02/04/2015 1049   CL 111 01/29/2018 0831   CO2 22 01/29/2018 0831   CO2 23 02/04/2015 1049   GLUCOSE 91 01/29/2018 0831   GLUCOSE 98 02/04/2015 1049   BUN 10 01/29/2018 0831   BUN 9.8 02/04/2015 1049   CREATININE 0.85 01/29/2018 0831   CREATININE 0.9 02/04/2015  1049   CALCIUM 8.7 (L) 01/29/2018 0831   CALCIUM 8.8 02/04/2015 1049   PROT 6.6 01/29/2018 0831   PROT 6.9 02/04/2015 1049   ALBUMIN 3.4 (L) 01/29/2018 0831   ALBUMIN 3.4 (L) 02/04/2015 1049   AST 12 (L) 01/29/2018 0831   AST 13 02/04/2015 1049   ALT 9 01/29/2018 0831   ALT <9 02/04/2015 1049   ALKPHOS 44 01/29/2018 0831   ALKPHOS 53 02/04/2015 1049   BILITOT 0.6 01/29/2018 0831   BILITOT 0.47 02/04/2015 1049   GFRNONAA >60 01/29/2018 0831   GFRAA >60 01/29/2018 0831        Component Value Date/Time   COLORURINE AMBER (A) 12/16/2013 0707   APPEARANCEUR HAZY (A) 12/16/2013 0707   LABSPEC 1.025 12/16/2013 0707   PHURINE 5.0 12/16/2013 0707   GLUCOSEU NEGATIVE 12/16/2013 0707   HGBUR LARGE (A) 12/16/2013 0707   BILIRUBINUR SMALL (A) 12/16/2013 0707   KETONESUR NEGATIVE 12/16/2013 0707   PROTEINUR NEGATIVE 12/16/2013 0707   UROBILINOGEN >8.0 (H) 12/16/2013 0707   NITRITE NEGATIVE 12/16/2013 0707   LEUKOCYTESUR SMALL (A) 12/16/2013 0707   Iron/TIBC/Ferritin/ %Sat    Component Value Date/Time   IRON 113 12/15/2013  2108   TIBC 207 (L) 12/15/2013 2108   FERRITIN 10 (L) 01/29/2018 0832   FERRITIN 9 12/09/2015 1037   IRONPCTSAT 55 12/15/2013 2108    STUD IES: No results found.   (1) B-12 deficiency: will need lifelong replacement; as patient does not have PCP this can be done through our office (a) received montlhy  (2) subacute combined degeneration - Stable  (3) iron deficiency: ferritin level on 02/04/15 was 6. Feraheme given on 03/11/15 and 03/19/15.   (a) iron level less than 10 on 01/20/2016. Feraheme given 02/24/2016  PLAN:  Shizue is doing fine as far as her B12 deficiency is concerned.  She continues to receive monthly shots here and she tolerates those well.  The plan is to continue that indefinitely.  I am seeing her on a once a year basis.  She is taking appropriate pandemic precautions  She knows to call for any other issue that may develop before the next visit.  Donnie Gedeon, Valentino Hue, MD  11/05/18 11:24 AM Medical Oncology and Hematology. [facility] [address] [place], [place] [address] Tel. [phone]    Fax. [phone]  I, Mal Misty am acting as a Neurosurgeon for Lowella Dell, MD.   I, Ruthann Cancer MD, have reviewed the above documentation for accuracy and completeness, and I agree with the above.

## 2018-11-06 ENCOUNTER — Telehealth: Payer: Self-pay | Admitting: Oncology

## 2018-11-06 NOTE — Telephone Encounter (Signed)
I talk with patient regarding schedule  

## 2018-11-21 DIAGNOSIS — J45998 Other asthma: Secondary | ICD-10-CM | POA: Diagnosis not present

## 2018-11-21 DIAGNOSIS — J45909 Unspecified asthma, uncomplicated: Secondary | ICD-10-CM | POA: Diagnosis not present

## 2018-12-03 ENCOUNTER — Inpatient Hospital Stay: Payer: Medicare Other | Attending: Oncology

## 2018-12-03 ENCOUNTER — Telehealth: Payer: Self-pay | Admitting: Oncology

## 2018-12-03 DIAGNOSIS — D519 Vitamin B12 deficiency anemia, unspecified: Secondary | ICD-10-CM | POA: Insufficient documentation

## 2018-12-03 DIAGNOSIS — E611 Iron deficiency: Secondary | ICD-10-CM | POA: Insufficient documentation

## 2018-12-03 NOTE — Telephone Encounter (Signed)
Returned patient's phone call regarding reschedule 11/03 missed appointment, per patient's request appointment has been rescheduled to 11/04.

## 2018-12-04 ENCOUNTER — Inpatient Hospital Stay: Payer: Medicare Other

## 2018-12-04 ENCOUNTER — Other Ambulatory Visit: Payer: Self-pay

## 2018-12-04 ENCOUNTER — Other Ambulatory Visit: Payer: Self-pay | Admitting: Oncology

## 2018-12-04 VITALS — BP 103/67 | HR 71 | Temp 98.7°F | Resp 16

## 2018-12-04 DIAGNOSIS — E611 Iron deficiency: Secondary | ICD-10-CM | POA: Diagnosis not present

## 2018-12-04 DIAGNOSIS — G8220 Paraplegia, unspecified: Secondary | ICD-10-CM

## 2018-12-04 DIAGNOSIS — D509 Iron deficiency anemia, unspecified: Secondary | ICD-10-CM

## 2018-12-04 DIAGNOSIS — D61818 Other pancytopenia: Secondary | ICD-10-CM

## 2018-12-04 DIAGNOSIS — E539 Vitamin B deficiency, unspecified: Secondary | ICD-10-CM

## 2018-12-04 DIAGNOSIS — E538 Deficiency of other specified B group vitamins: Secondary | ICD-10-CM

## 2018-12-04 DIAGNOSIS — R29898 Other symptoms and signs involving the musculoskeletal system: Secondary | ICD-10-CM

## 2018-12-04 DIAGNOSIS — D519 Vitamin B12 deficiency anemia, unspecified: Secondary | ICD-10-CM | POA: Diagnosis not present

## 2018-12-04 MED ORDER — CYANOCOBALAMIN 1000 MCG/ML IJ SOLN
1000.0000 ug | Freq: Once | INTRAMUSCULAR | Status: AC
  Administered 2018-12-04: 1000 ug via INTRAMUSCULAR

## 2018-12-04 MED ORDER — SODIUM CHLORIDE 0.9 % IV SOLN
INTRAVENOUS | Status: DC
  Administered 2018-12-04: 09:00:00 via INTRAVENOUS
  Filled 2018-12-04: qty 250

## 2018-12-04 MED ORDER — SODIUM CHLORIDE 0.9 % IV SOLN
510.0000 mg | Freq: Once | INTRAVENOUS | Status: AC
  Administered 2018-12-04: 510 mg via INTRAVENOUS
  Filled 2018-12-04: qty 510

## 2018-12-10 ENCOUNTER — Inpatient Hospital Stay: Payer: Medicare Other

## 2018-12-31 ENCOUNTER — Inpatient Hospital Stay: Payer: Medicare Other | Attending: Oncology

## 2018-12-31 ENCOUNTER — Other Ambulatory Visit: Payer: Self-pay

## 2018-12-31 VITALS — BP 108/71 | HR 66 | Temp 98.7°F | Resp 18

## 2018-12-31 DIAGNOSIS — D519 Vitamin B12 deficiency anemia, unspecified: Secondary | ICD-10-CM | POA: Insufficient documentation

## 2018-12-31 DIAGNOSIS — E539 Vitamin B deficiency, unspecified: Secondary | ICD-10-CM

## 2018-12-31 DIAGNOSIS — D61818 Other pancytopenia: Secondary | ICD-10-CM

## 2018-12-31 DIAGNOSIS — R29898 Other symptoms and signs involving the musculoskeletal system: Secondary | ICD-10-CM

## 2018-12-31 DIAGNOSIS — G8220 Paraplegia, unspecified: Secondary | ICD-10-CM

## 2018-12-31 DIAGNOSIS — E538 Deficiency of other specified B group vitamins: Secondary | ICD-10-CM

## 2018-12-31 MED ORDER — CYANOCOBALAMIN 1000 MCG/ML IJ SOLN
1000.0000 ug | Freq: Once | INTRAMUSCULAR | Status: AC
  Administered 2018-12-31: 1000 ug via INTRAMUSCULAR

## 2019-01-28 ENCOUNTER — Inpatient Hospital Stay: Payer: Medicare Other

## 2019-02-25 ENCOUNTER — Inpatient Hospital Stay: Payer: Medicare Other | Attending: Oncology

## 2019-02-25 ENCOUNTER — Other Ambulatory Visit: Payer: Self-pay

## 2019-02-25 VITALS — BP 115/77 | HR 72 | Temp 98.1°F | Resp 18

## 2019-02-25 DIAGNOSIS — D519 Vitamin B12 deficiency anemia, unspecified: Secondary | ICD-10-CM | POA: Insufficient documentation

## 2019-02-25 DIAGNOSIS — E539 Vitamin B deficiency, unspecified: Secondary | ICD-10-CM

## 2019-02-25 DIAGNOSIS — R29898 Other symptoms and signs involving the musculoskeletal system: Secondary | ICD-10-CM

## 2019-02-25 DIAGNOSIS — D61818 Other pancytopenia: Secondary | ICD-10-CM

## 2019-02-25 DIAGNOSIS — E538 Deficiency of other specified B group vitamins: Secondary | ICD-10-CM

## 2019-02-25 DIAGNOSIS — G8220 Paraplegia, unspecified: Secondary | ICD-10-CM

## 2019-02-25 MED ORDER — CYANOCOBALAMIN 1000 MCG/ML IJ SOLN
1000.0000 ug | Freq: Once | INTRAMUSCULAR | Status: AC
  Administered 2019-02-25: 1000 ug via INTRAMUSCULAR

## 2019-02-25 MED ORDER — CYANOCOBALAMIN 1000 MCG/ML IJ SOLN
INTRAMUSCULAR | Status: AC
  Filled 2019-02-25: qty 1

## 2019-03-25 ENCOUNTER — Inpatient Hospital Stay: Payer: Medicare Other | Attending: Oncology

## 2019-03-25 ENCOUNTER — Other Ambulatory Visit: Payer: Self-pay

## 2019-03-25 VITALS — BP 110/77 | HR 77 | Temp 98.1°F | Resp 18

## 2019-03-25 DIAGNOSIS — E539 Vitamin B deficiency, unspecified: Secondary | ICD-10-CM

## 2019-03-25 DIAGNOSIS — D61818 Other pancytopenia: Secondary | ICD-10-CM

## 2019-03-25 DIAGNOSIS — D519 Vitamin B12 deficiency anemia, unspecified: Secondary | ICD-10-CM | POA: Insufficient documentation

## 2019-03-25 DIAGNOSIS — E538 Deficiency of other specified B group vitamins: Secondary | ICD-10-CM

## 2019-03-25 DIAGNOSIS — R29898 Other symptoms and signs involving the musculoskeletal system: Secondary | ICD-10-CM

## 2019-03-25 DIAGNOSIS — G8220 Paraplegia, unspecified: Secondary | ICD-10-CM

## 2019-03-25 DIAGNOSIS — G63 Polyneuropathy in diseases classified elsewhere: Secondary | ICD-10-CM

## 2019-03-25 MED ORDER — CYANOCOBALAMIN 1000 MCG/ML IJ SOLN
1000.0000 ug | Freq: Once | INTRAMUSCULAR | Status: AC
  Administered 2019-03-25: 1000 ug via INTRAMUSCULAR

## 2019-03-25 MED ORDER — CYANOCOBALAMIN 1000 MCG/ML IJ SOLN
INTRAMUSCULAR | Status: AC
  Filled 2019-03-25: qty 1

## 2019-04-22 ENCOUNTER — Inpatient Hospital Stay: Payer: Medicare Other | Attending: Oncology

## 2019-04-22 ENCOUNTER — Other Ambulatory Visit: Payer: Self-pay

## 2019-04-22 VITALS — BP 130/88 | HR 62 | Temp 98.3°F | Resp 18

## 2019-04-22 DIAGNOSIS — D519 Vitamin B12 deficiency anemia, unspecified: Secondary | ICD-10-CM | POA: Insufficient documentation

## 2019-04-22 DIAGNOSIS — E538 Deficiency of other specified B group vitamins: Secondary | ICD-10-CM

## 2019-04-22 DIAGNOSIS — R29898 Other symptoms and signs involving the musculoskeletal system: Secondary | ICD-10-CM

## 2019-04-22 DIAGNOSIS — D61818 Other pancytopenia: Secondary | ICD-10-CM

## 2019-04-22 DIAGNOSIS — E539 Vitamin B deficiency, unspecified: Secondary | ICD-10-CM

## 2019-04-22 DIAGNOSIS — G8220 Paraplegia, unspecified: Secondary | ICD-10-CM

## 2019-04-22 MED ORDER — CYANOCOBALAMIN 1000 MCG/ML IJ SOLN
1000.0000 ug | Freq: Once | INTRAMUSCULAR | Status: AC
  Administered 2019-04-22: 1000 ug via INTRAMUSCULAR

## 2019-04-22 MED ORDER — CYANOCOBALAMIN 1000 MCG/ML IJ SOLN
INTRAMUSCULAR | Status: AC
  Filled 2019-04-22: qty 1

## 2019-04-22 NOTE — Patient Instructions (Signed)

## 2019-05-15 DIAGNOSIS — Z7901 Long term (current) use of anticoagulants: Secondary | ICD-10-CM | POA: Diagnosis not present

## 2019-05-15 DIAGNOSIS — Z01818 Encounter for other preprocedural examination: Secondary | ICD-10-CM | POA: Diagnosis not present

## 2019-05-20 ENCOUNTER — Inpatient Hospital Stay: Payer: Medicare Other | Attending: Oncology

## 2019-05-20 ENCOUNTER — Other Ambulatory Visit: Payer: Self-pay

## 2019-05-20 VITALS — BP 112/91 | HR 61 | Temp 98.5°F | Resp 20

## 2019-05-20 DIAGNOSIS — G8220 Paraplegia, unspecified: Secondary | ICD-10-CM

## 2019-05-20 DIAGNOSIS — R29898 Other symptoms and signs involving the musculoskeletal system: Secondary | ICD-10-CM

## 2019-05-20 DIAGNOSIS — D519 Vitamin B12 deficiency anemia, unspecified: Secondary | ICD-10-CM | POA: Diagnosis not present

## 2019-05-20 DIAGNOSIS — E538 Deficiency of other specified B group vitamins: Secondary | ICD-10-CM

## 2019-05-20 DIAGNOSIS — E539 Vitamin B deficiency, unspecified: Secondary | ICD-10-CM

## 2019-05-20 DIAGNOSIS — D61818 Other pancytopenia: Secondary | ICD-10-CM

## 2019-05-20 MED ORDER — CYANOCOBALAMIN 1000 MCG/ML IJ SOLN
INTRAMUSCULAR | Status: AC
  Filled 2019-05-20: qty 1

## 2019-05-20 MED ORDER — CYANOCOBALAMIN 1000 MCG/ML IJ SOLN
1000.0000 ug | Freq: Once | INTRAMUSCULAR | Status: AC
  Administered 2019-05-20: 1000 ug via INTRAMUSCULAR

## 2019-05-20 NOTE — Patient Instructions (Signed)
Cyanocobalamin, Pyridoxine, and Folate What is this medicine? A multivitamin containing folic acid, vitamin B6, and vitamin B12. This medicine may be used for other purposes; ask your health care provider or pharmacist if you have questions. COMMON BRAND NAME(S): AllanFol RX, AllanTex, Av-Vite FB, B Complex with Folic Acid, ComBgen, FaBB, Folamin, Folastin, Folbalin, Folbee, Folbic, Folcaps, Folgard, Folgard RX, Folgard RX 2.2, Folplex, Folplex 2.2, Foltabs 800, Foltx, Homocysteine Formula, Niva-Fol, NuFol, TL Gard RX, Virt-Gard, Virt-Vite, Virt-Vite Forte, Vita-Respa What should I tell my health care provider before I take this medicine? They need to know if you have any of these conditions:  bleeding or clotting disorder  history of anemia of any type  other chronic health condition  an unusual or allergic reaction to vitamins, other medicines, foods, dyes, or preservatives  pregnant or trying to get pregnant  breast-feeding How should I use this medicine? Take by mouth with a glass of water. May take with food. Follow the directions on the prescription label. It is usually given once a day. Do not take your medicine more often than directed. Contact your pediatrician regarding the use of this medicine in children. Special care may be needed. Overdosage: If you think you have taken too much of this medicine contact a poison control center or emergency room at once. NOTE: This medicine is only for you. Do not share this medicine with others. What if I miss a dose? If you miss a dose, take it as soon as you can. If it is almost time for your next dose, take only that dose. Do not take double or extra doses. What may interact with this medicine?  levodopa This list may not describe all possible interactions. Give your health care provider a list of all the medicines, herbs, non-prescription drugs, or dietary supplements you use. Also tell them if you smoke, drink alcohol, or use illegal  drugs. Some items may interact with your medicine. What should I watch for while using this medicine? See your health care professional for regular checks on your progress. Remember that vitamin supplements do not replace the need for good nutrition from a balanced diet. What side effects may I notice from receiving this medicine? Side effects that you should report to your doctor or health care professional as soon as possible:  allergic reaction such as skin rash or difficulty breathing  vomiting Side effects that usually do not require medical attention (report to your doctor or health care professional if they continue or are bothersome):  nausea  stomach upset This list may not describe all possible side effects. Call your doctor for medical advice about side effects. You may report side effects to FDA at 1-800-FDA-1088. Where should I keep my medicine? Keep out of the reach of children. Most vitamins should be stored at controlled room temperature. Check your specific product directions. Protect from heat and moisture. Throw away any unused medicine after the expiration date. NOTE: This sheet is a summary. It may not cover all possible information. If you have questions about this medicine, talk to your doctor, pharmacist, or health care provider.  2020 Elsevier/Gold Standard (2007-03-09 00:59:55)  

## 2019-05-30 DIAGNOSIS — R3129 Other microscopic hematuria: Secondary | ICD-10-CM | POA: Diagnosis not present

## 2019-05-30 DIAGNOSIS — Z01818 Encounter for other preprocedural examination: Secondary | ICD-10-CM | POA: Diagnosis not present

## 2019-06-17 ENCOUNTER — Inpatient Hospital Stay: Payer: Medicare Other | Attending: Oncology

## 2019-07-15 ENCOUNTER — Other Ambulatory Visit: Payer: Self-pay

## 2019-07-15 ENCOUNTER — Inpatient Hospital Stay: Payer: Medicare Other | Attending: Oncology

## 2019-07-15 VITALS — BP 118/72 | HR 62 | Temp 98.2°F | Resp 18

## 2019-07-15 DIAGNOSIS — R29898 Other symptoms and signs involving the musculoskeletal system: Secondary | ICD-10-CM

## 2019-07-15 DIAGNOSIS — E539 Vitamin B deficiency, unspecified: Secondary | ICD-10-CM

## 2019-07-15 DIAGNOSIS — D519 Vitamin B12 deficiency anemia, unspecified: Secondary | ICD-10-CM | POA: Diagnosis not present

## 2019-07-15 DIAGNOSIS — G8220 Paraplegia, unspecified: Secondary | ICD-10-CM

## 2019-07-15 DIAGNOSIS — E538 Deficiency of other specified B group vitamins: Secondary | ICD-10-CM

## 2019-07-15 DIAGNOSIS — D61818 Other pancytopenia: Secondary | ICD-10-CM

## 2019-07-15 MED ORDER — CYANOCOBALAMIN 1000 MCG/ML IJ SOLN
1000.0000 ug | Freq: Once | INTRAMUSCULAR | Status: AC
  Administered 2019-07-15: 1000 ug via INTRAMUSCULAR

## 2019-07-15 NOTE — Patient Instructions (Signed)

## 2019-08-12 ENCOUNTER — Inpatient Hospital Stay: Payer: Medicare Other | Attending: Oncology

## 2019-08-12 ENCOUNTER — Other Ambulatory Visit: Payer: Self-pay

## 2019-08-12 VITALS — BP 124/89 | HR 83 | Resp 18

## 2019-08-12 DIAGNOSIS — D61818 Other pancytopenia: Secondary | ICD-10-CM

## 2019-08-12 DIAGNOSIS — D519 Vitamin B12 deficiency anemia, unspecified: Secondary | ICD-10-CM | POA: Diagnosis not present

## 2019-08-12 DIAGNOSIS — E538 Deficiency of other specified B group vitamins: Secondary | ICD-10-CM

## 2019-08-12 DIAGNOSIS — R29898 Other symptoms and signs involving the musculoskeletal system: Secondary | ICD-10-CM

## 2019-08-12 DIAGNOSIS — G8220 Paraplegia, unspecified: Secondary | ICD-10-CM

## 2019-08-12 DIAGNOSIS — E539 Vitamin B deficiency, unspecified: Secondary | ICD-10-CM

## 2019-08-12 MED ORDER — CYANOCOBALAMIN 1000 MCG/ML IJ SOLN
1000.0000 ug | Freq: Once | INTRAMUSCULAR | Status: AC
  Administered 2019-08-12: 1000 ug via INTRAMUSCULAR

## 2019-08-12 NOTE — Patient Instructions (Signed)
Cyanocobalamin, Pyridoxine, and Folate What is this medicine? A multivitamin containing folic acid, vitamin B6, and vitamin B12. This medicine may be used for other purposes; ask your health care provider or pharmacist if you have questions. COMMON BRAND NAME(S): AllanFol RX, AllanTex, Av-Vite FB, B Complex with Folic Acid, ComBgen, FaBB, Folamin, Folastin, Folbalin, Folbee, Folbic, Folcaps, Folgard, Folgard RX, Folgard RX 2.2, Folplex, Folplex 2.2, Foltabs 800, Foltx, Homocysteine Formula, Niva-Fol, NuFol, TL Gard RX, Virt-Gard, Virt-Vite, Virt-Vite Forte, Vita-Respa What should I tell my health care provider before I take this medicine? They need to know if you have any of these conditions:  bleeding or clotting disorder  history of anemia of any type  other chronic health condition  an unusual or allergic reaction to vitamins, other medicines, foods, dyes, or preservatives  pregnant or trying to get pregnant  breast-feeding How should I use this medicine? Take by mouth with a glass of water. May take with food. Follow the directions on the prescription label. It is usually given once a day. Do not take your medicine more often than directed. Contact your pediatrician regarding the use of this medicine in children. Special care may be needed. Overdosage: If you think you have taken too much of this medicine contact a poison control center or emergency room at once. NOTE: This medicine is only for you. Do not share this medicine with others. What if I miss a dose? If you miss a dose, take it as soon as you can. If it is almost time for your next dose, take only that dose. Do not take double or extra doses. What may interact with this medicine?  levodopa This list may not describe all possible interactions. Give your health care provider a list of all the medicines, herbs, non-prescription drugs, or dietary supplements you use. Also tell them if you smoke, drink alcohol, or use illegal  drugs. Some items may interact with your medicine. What should I watch for while using this medicine? See your health care professional for regular checks on your progress. Remember that vitamin supplements do not replace the need for good nutrition from a balanced diet. What side effects may I notice from receiving this medicine? Side effects that you should report to your doctor or health care professional as soon as possible:  allergic reaction such as skin rash or difficulty breathing  vomiting Side effects that usually do not require medical attention (report to your doctor or health care professional if they continue or are bothersome):  nausea  stomach upset This list may not describe all possible side effects. Call your doctor for medical advice about side effects. You may report side effects to FDA at 1-800-FDA-1088. Where should I keep my medicine? Keep out of the reach of children. Most vitamins should be stored at controlled room temperature. Check your specific product directions. Protect from heat and moisture. Throw away any unused medicine after the expiration date. NOTE: This sheet is a summary. It may not cover all possible information. If you have questions about this medicine, talk to your doctor, pharmacist, or health care provider.  2020 Elsevier/Gold Standard (2007-03-09 00:59:55)  

## 2019-09-09 ENCOUNTER — Inpatient Hospital Stay: Payer: Medicare Other | Attending: Oncology

## 2019-10-05 ENCOUNTER — Emergency Department (HOSPITAL_COMMUNITY)
Admission: EM | Admit: 2019-10-05 | Discharge: 2019-10-05 | Disposition: A | Discharge status: Home / Self Care | Payer: Medicare Other | Attending: Emergency Medicine | Admitting: Emergency Medicine

## 2019-10-05 ENCOUNTER — Other Ambulatory Visit: Payer: Self-pay

## 2019-10-05 DIAGNOSIS — Y939 Activity, unspecified: Secondary | ICD-10-CM | POA: Diagnosis not present

## 2019-10-05 DIAGNOSIS — M791 Myalgia, unspecified site: Secondary | ICD-10-CM | POA: Insufficient documentation

## 2019-10-05 DIAGNOSIS — Y92410 Unspecified street and highway as the place of occurrence of the external cause: Secondary | ICD-10-CM | POA: Diagnosis not present

## 2019-10-05 DIAGNOSIS — Z79899 Other long term (current) drug therapy: Secondary | ICD-10-CM | POA: Insufficient documentation

## 2019-10-05 DIAGNOSIS — M25561 Pain in right knee: Secondary | ICD-10-CM | POA: Diagnosis not present

## 2019-10-05 DIAGNOSIS — Y999 Unspecified external cause status: Secondary | ICD-10-CM | POA: Insufficient documentation

## 2019-10-05 DIAGNOSIS — J45909 Unspecified asthma, uncomplicated: Secondary | ICD-10-CM | POA: Diagnosis not present

## 2019-10-05 DIAGNOSIS — M7918 Myalgia, other site: Secondary | ICD-10-CM

## 2019-10-05 DIAGNOSIS — M25562 Pain in left knee: Secondary | ICD-10-CM | POA: Diagnosis not present

## 2019-10-05 MED ORDER — METHOCARBAMOL 500 MG PO TABS: 500.0000 mg | ORAL_TABLET | Freq: Two times a day (BID) | ORAL | 0 refills | Status: DC

## 2019-10-05 MED ORDER — ACETAMINOPHEN ER 650 MG PO TBCR: 650.0000 mg | EXTENDED_RELEASE_TABLET | Freq: Three times a day (TID) | ORAL | 0 refills | Status: DC | PRN
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
## 2019-10-07 ENCOUNTER — Other Ambulatory Visit: Payer: Self-pay

## 2019-10-07 ENCOUNTER — Inpatient Hospital Stay: Payer: Medicare Other | Attending: Oncology

## 2019-10-07 VITALS — BP 129/85 | HR 77 | Temp 98.2°F | Resp 18

## 2019-10-07 DIAGNOSIS — E539 Vitamin B deficiency, unspecified: Secondary | ICD-10-CM

## 2019-10-07 DIAGNOSIS — D519 Vitamin B12 deficiency anemia, unspecified: Secondary | ICD-10-CM | POA: Insufficient documentation

## 2019-10-07 DIAGNOSIS — R29898 Other symptoms and signs involving the musculoskeletal system: Secondary | ICD-10-CM

## 2019-10-07 DIAGNOSIS — D61818 Other pancytopenia: Secondary | ICD-10-CM

## 2019-10-07 DIAGNOSIS — G8220 Paraplegia, unspecified: Secondary | ICD-10-CM

## 2019-10-07 DIAGNOSIS — E538 Deficiency of other specified B group vitamins: Secondary | ICD-10-CM

## 2019-10-07 MED ORDER — CYANOCOBALAMIN 1000 MCG/ML IJ SOLN
INTRAMUSCULAR | Status: AC
  Filled 2019-10-07: qty 1

## 2019-10-07 MED ORDER — CYANOCOBALAMIN 1000 MCG/ML IJ SOLN
1000.0000 ug | Freq: Once | INTRAMUSCULAR | Status: AC
  Administered 2019-10-07: 1000 ug via INTRAMUSCULAR
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
## 2019-11-04 ENCOUNTER — Other Ambulatory Visit: Payer: Self-pay

## 2019-11-04 ENCOUNTER — Inpatient Hospital Stay (HOSPITAL_BASED_OUTPATIENT_CLINIC_OR_DEPARTMENT_OTHER): Payer: Medicare Other | Admitting: Oncology

## 2019-11-04 ENCOUNTER — Inpatient Hospital Stay: Payer: Medicare Other | Attending: Oncology

## 2019-11-04 ENCOUNTER — Inpatient Hospital Stay: Payer: Medicare Other

## 2019-11-04 VITALS — BP 118/76 | HR 67 | Temp 97.8°F | Resp 20 | Ht 63.0 in | Wt 208.1 lb

## 2019-11-04 DIAGNOSIS — R29898 Other symptoms and signs involving the musculoskeletal system: Secondary | ICD-10-CM

## 2019-11-04 DIAGNOSIS — E538 Deficiency of other specified B group vitamins: Secondary | ICD-10-CM

## 2019-11-04 DIAGNOSIS — E669 Obesity, unspecified: Secondary | ICD-10-CM | POA: Insufficient documentation

## 2019-11-04 DIAGNOSIS — Z8249 Family history of ischemic heart disease and other diseases of the circulatory system: Secondary | ICD-10-CM | POA: Diagnosis not present

## 2019-11-04 DIAGNOSIS — G8220 Paraplegia, unspecified: Secondary | ICD-10-CM

## 2019-11-04 DIAGNOSIS — D61818 Other pancytopenia: Secondary | ICD-10-CM

## 2019-11-04 DIAGNOSIS — E539 Vitamin B deficiency, unspecified: Secondary | ICD-10-CM | POA: Diagnosis not present

## 2019-11-04 DIAGNOSIS — Z87440 Personal history of urinary (tract) infections: Secondary | ICD-10-CM | POA: Diagnosis not present

## 2019-11-04 DIAGNOSIS — G320 Subacute combined degeneration of spinal cord in diseases classified elsewhere: Secondary | ICD-10-CM

## 2019-11-04 DIAGNOSIS — F329 Major depressive disorder, single episode, unspecified: Secondary | ICD-10-CM | POA: Diagnosis not present

## 2019-11-04 DIAGNOSIS — D509 Iron deficiency anemia, unspecified: Secondary | ICD-10-CM | POA: Diagnosis not present

## 2019-11-04 DIAGNOSIS — D519 Vitamin B12 deficiency anemia, unspecified: Secondary | ICD-10-CM | POA: Insufficient documentation

## 2019-11-04 DIAGNOSIS — J45909 Unspecified asthma, uncomplicated: Secondary | ICD-10-CM | POA: Insufficient documentation

## 2019-11-04 DIAGNOSIS — G63 Polyneuropathy in diseases classified elsewhere: Secondary | ICD-10-CM | POA: Diagnosis not present

## 2019-11-04 DIAGNOSIS — Z79899 Other long term (current) drug therapy: Secondary | ICD-10-CM | POA: Diagnosis not present

## 2019-11-04 DIAGNOSIS — Z833 Family history of diabetes mellitus: Secondary | ICD-10-CM | POA: Diagnosis not present

## 2019-11-04 LAB — COMPREHENSIVE METABOLIC PANEL
ALT: 9 U/L (ref 0–44)
AST: 12 U/L — ABNORMAL LOW (ref 15–41)
Albumin: 3.3 g/dL — ABNORMAL LOW (ref 3.5–5.0)
Alkaline Phosphatase: 44 U/L (ref 38–126)
Anion gap: 2 — ABNORMAL LOW (ref 5–15)
BUN: 8 mg/dL (ref 6–20)
CO2: 29 mmol/L (ref 22–32)
Calcium: 9 mg/dL (ref 8.9–10.3)
Chloride: 108 mmol/L (ref 98–111)
Creatinine, Ser: 0.82 mg/dL (ref 0.44–1.00)
GFR calc non Af Amer: >60 mL/min (ref >60)
Glucose, Bld: 84 mg/dL (ref 70–99)
Potassium: 3.8 mmol/L (ref 3.5–5.1)
Sodium: 139 mmol/L (ref 135–145)
Total Bilirubin: 0.3 mg/dL (ref 0.3–1.2)
Total Protein: 6.6 g/dL (ref 6.5–8.1)

## 2019-11-04 LAB — CBC WITH DIFFERENTIAL/PLATELET
Abs Immature Granulocytes: 0.05 10*3/uL (ref 0.00–0.07)
Basophils Absolute: 0 10*3/uL (ref 0.0–0.1)
Basophils Relative: 0 %
Eosinophils Absolute: 0.1 10*3/uL (ref 0.0–0.5)
Eosinophils Relative: 2 %
HCT: 37.8 % (ref 36.0–46.0)
Hemoglobin: 12.3 g/dL (ref 12.0–15.0)
Immature Granulocytes: 1 %
Lymphocytes Relative: 39 %
Lymphs Abs: 3 10*3/uL (ref 0.7–4.0)
MCH: 29.1 pg (ref 26.0–34.0)
MCHC: 32.5 g/dL (ref 30.0–36.0)
MCV: 89.4 fL (ref 80.0–100.0)
Monocytes Absolute: 0.4 10*3/uL (ref 0.1–1.0)
Monocytes Relative: 5 %
Neutro Abs: 4.2 10*3/uL (ref 1.7–7.7)
Neutrophils Relative %: 53 %
Platelets: 241 10*3/uL (ref 150–400)
RBC: 4.23 MIL/uL (ref 3.87–5.11)
RDW: 13.4 % (ref 11.5–15.5)
WBC: 7.7 10*3/uL (ref 4.0–10.5)
nRBC: 0 % (ref 0.0–0.2)

## 2019-11-04 LAB — VITAMIN B12: Vitamin B-12: 154 pg/mL — ABNORMAL LOW (ref 180–914)

## 2019-11-04 LAB — FERRITIN: Ferritin: 8 ng/mL — ABNORMAL LOW (ref 11–307)

## 2019-11-04 MED ORDER — CYANOCOBALAMIN 1000 MCG/ML IJ SOLN
INTRAMUSCULAR | Status: AC
  Filled 2019-11-04: qty 1

## 2019-11-04 MED ORDER — CYANOCOBALAMIN 1000 MCG/ML IJ SOLN
1000.0000 ug | Freq: Once | INTRAMUSCULAR | Status: AC
  Administered 2019-11-04: 1000 ug via INTRAMUSCULAR

## 2019-11-06 ENCOUNTER — Telehealth: Payer: Self-pay | Admitting: Oncology

## 2019-11-06 ENCOUNTER — Other Ambulatory Visit: Payer: Self-pay | Admitting: Oncology

## 2019-11-06 NOTE — Progress Notes (Signed)
I called Jill Summers with results of her iron studies which are low.  She will receive 2 doses of Feraheme this month.  We will repeat labs in November.  We are continuing B12 supplementation monthly--her B12 level is actually a bit on the low side still.

## 2019-11-06 NOTE — Telephone Encounter (Signed)
Scheduled appt per 10/7 sch msg - pt is aware of appts added for iron infusions.

## 2019-11-13 ENCOUNTER — Ambulatory Visit: Payer: Medicare Other

## 2019-11-13 ENCOUNTER — Inpatient Hospital Stay: Payer: Medicare Other

## 2019-11-20 ENCOUNTER — Inpatient Hospital Stay: Payer: Medicare Other

## 2019-12-02 ENCOUNTER — Inpatient Hospital Stay: Payer: Medicare Other | Attending: Oncology

## 2019-12-30 ENCOUNTER — Inpatient Hospital Stay: Payer: Medicare Other

## 2020-01-27 ENCOUNTER — Ambulatory Visit: Payer: Medicare Other

## 2020-02-09 ENCOUNTER — Other Ambulatory Visit: Payer: Medicare Other

## 2020-02-24 ENCOUNTER — Inpatient Hospital Stay: Payer: Medicare Other | Attending: Oncology

## 2020-02-24 ENCOUNTER — Other Ambulatory Visit: Payer: Self-pay

## 2020-02-24 VITALS — BP 124/81 | HR 80

## 2020-02-24 DIAGNOSIS — E538 Deficiency of other specified B group vitamins: Secondary | ICD-10-CM

## 2020-02-24 DIAGNOSIS — D61818 Other pancytopenia: Secondary | ICD-10-CM

## 2020-02-24 DIAGNOSIS — D519 Vitamin B12 deficiency anemia, unspecified: Secondary | ICD-10-CM | POA: Diagnosis not present

## 2020-02-24 DIAGNOSIS — G63 Polyneuropathy in diseases classified elsewhere: Secondary | ICD-10-CM

## 2020-02-24 DIAGNOSIS — R29898 Other symptoms and signs involving the musculoskeletal system: Secondary | ICD-10-CM

## 2020-02-24 DIAGNOSIS — G8220 Paraplegia, unspecified: Secondary | ICD-10-CM

## 2020-02-24 DIAGNOSIS — E539 Vitamin B deficiency, unspecified: Secondary | ICD-10-CM

## 2020-02-24 MED ORDER — CYANOCOBALAMIN 1000 MCG/ML IJ SOLN
1000.0000 ug | Freq: Once | INTRAMUSCULAR | Status: AC
  Administered 2020-02-24: 1000 ug via INTRAMUSCULAR

## 2020-02-24 MED ORDER — CYANOCOBALAMIN 1000 MCG/ML IJ SOLN
INTRAMUSCULAR | Status: AC
  Filled 2020-02-24: qty 1

## 2020-02-24 NOTE — Patient Instructions (Signed)

## 2020-03-23 ENCOUNTER — Inpatient Hospital Stay: Payer: Medicare Other | Attending: Oncology

## 2020-03-23 ENCOUNTER — Other Ambulatory Visit: Payer: Self-pay

## 2020-03-23 VITALS — BP 110/82 | HR 79 | Temp 98.2°F | Resp 18

## 2020-03-23 DIAGNOSIS — G8220 Paraplegia, unspecified: Secondary | ICD-10-CM

## 2020-03-23 DIAGNOSIS — R29898 Other symptoms and signs involving the musculoskeletal system: Secondary | ICD-10-CM

## 2020-03-23 DIAGNOSIS — E539 Vitamin B deficiency, unspecified: Secondary | ICD-10-CM

## 2020-03-23 DIAGNOSIS — D61818 Other pancytopenia: Secondary | ICD-10-CM

## 2020-03-23 DIAGNOSIS — D519 Vitamin B12 deficiency anemia, unspecified: Secondary | ICD-10-CM | POA: Diagnosis not present

## 2020-03-23 DIAGNOSIS — E538 Deficiency of other specified B group vitamins: Secondary | ICD-10-CM

## 2020-03-23 DIAGNOSIS — G63 Polyneuropathy in diseases classified elsewhere: Secondary | ICD-10-CM

## 2020-03-23 MED ORDER — CYANOCOBALAMIN 1000 MCG/ML IJ SOLN
INTRAMUSCULAR | Status: AC
  Filled 2020-03-23: qty 1

## 2020-03-23 MED ORDER — CYANOCOBALAMIN 1000 MCG/ML IJ SOLN
1000.0000 ug | Freq: Once | INTRAMUSCULAR | Status: AC
  Administered 2020-03-23: 1000 ug via INTRAMUSCULAR

## 2020-03-23 NOTE — Patient Instructions (Signed)

## 2020-04-20 ENCOUNTER — Inpatient Hospital Stay: Payer: Medicare Other | Attending: Oncology

## 2020-05-18 ENCOUNTER — Inpatient Hospital Stay: Payer: Medicare Other | Attending: Oncology

## 2020-05-18 ENCOUNTER — Other Ambulatory Visit: Payer: Self-pay

## 2020-05-18 DIAGNOSIS — E539 Vitamin B deficiency, unspecified: Secondary | ICD-10-CM

## 2020-05-18 DIAGNOSIS — D519 Vitamin B12 deficiency anemia, unspecified: Secondary | ICD-10-CM | POA: Diagnosis not present

## 2020-05-18 DIAGNOSIS — D61818 Other pancytopenia: Secondary | ICD-10-CM

## 2020-05-18 DIAGNOSIS — E538 Deficiency of other specified B group vitamins: Secondary | ICD-10-CM

## 2020-05-18 DIAGNOSIS — G8220 Paraplegia, unspecified: Secondary | ICD-10-CM

## 2020-05-18 DIAGNOSIS — R29898 Other symptoms and signs involving the musculoskeletal system: Secondary | ICD-10-CM

## 2020-05-18 MED ORDER — CYANOCOBALAMIN 1000 MCG/ML IJ SOLN
1000.0000 ug | Freq: Once | INTRAMUSCULAR | Status: AC
  Administered 2020-05-18: 1000 ug via INTRAMUSCULAR

## 2020-05-18 MED ORDER — CYANOCOBALAMIN 1000 MCG/ML IJ SOLN
INTRAMUSCULAR | Status: AC
  Filled 2020-05-18: qty 1

## 2020-05-18 NOTE — Patient Instructions (Signed)

## 2020-06-01 ENCOUNTER — Emergency Department (HOSPITAL_BASED_OUTPATIENT_CLINIC_OR_DEPARTMENT_OTHER)
Admission: EM | Admit: 2020-06-01 | Discharge: 2020-06-01 | Disposition: A | Discharge status: Home / Self Care | Payer: Medicare Other | Attending: Emergency Medicine | Admitting: Emergency Medicine

## 2020-06-01 ENCOUNTER — Encounter (HOSPITAL_BASED_OUTPATIENT_CLINIC_OR_DEPARTMENT_OTHER): Payer: Self-pay | Admitting: Obstetrics and Gynecology

## 2020-06-01 ENCOUNTER — Other Ambulatory Visit: Payer: Self-pay

## 2020-06-01 ENCOUNTER — Other Ambulatory Visit (HOSPITAL_BASED_OUTPATIENT_CLINIC_OR_DEPARTMENT_OTHER): Payer: Self-pay

## 2020-06-01 DIAGNOSIS — Z91040 Latex allergy status: Secondary | ICD-10-CM | POA: Insufficient documentation

## 2020-06-01 DIAGNOSIS — J45909 Unspecified asthma, uncomplicated: Secondary | ICD-10-CM | POA: Insufficient documentation

## 2020-06-01 DIAGNOSIS — K029 Dental caries, unspecified: Secondary | ICD-10-CM | POA: Insufficient documentation

## 2020-06-01 DIAGNOSIS — K0889 Other specified disorders of teeth and supporting structures: Secondary | ICD-10-CM | POA: Diagnosis not present

## 2020-06-01 MED ORDER — HYDROCODONE-ACETAMINOPHEN 5-325 MG PO TABS
1.0000 | ORAL_TABLET | Freq: Four times a day (QID) | ORAL | 0 refills | Status: DC | PRN
  Filled 2020-06-01: qty 12, 3d supply, fill #0

## 2020-06-01 MED ORDER — PENICILLIN V POTASSIUM 500 MG PO TABS
500.0000 mg | ORAL_TABLET | Freq: Four times a day (QID) | ORAL | 0 refills | Status: AC
  Filled 2020-06-01: qty 28, 7d supply, fill #0

## 2020-06-01 NOTE — ED Triage Notes (Signed)
Patient reports pain in the upper left dental region.

## 2020-06-01 NOTE — ED Provider Notes (Signed)
[facility] EMERGENCY DEPT Provider Note   CSN: [id number] Arrival date & time: 06/01/20  1007     History Chief Complaint  Patient presents with  . Dental Pain    Jill Summers is a 35 y.o. female.  Patient with a complaint of left upper molar pain since yesterday.  Throbbing in nature.  Patient's been taking Tylenol at home.  Denies any fevers or difficulty swallowing.  Is associated with a headache.  Patient states the tooth is tender to touch.  Past medical history significant for paraplegia.  Iron deficiency anemia.  Followed by hematology.        Past Medical History:  Diagnosis Date  . Adopted   . Asthma    since baby  . Bacterial vaginosis   . Chlamydia   . Depression    At age 7  . MRSA infection   . Obesity   . Urinary tract infection     Patient Active Problem List   Diagnosis Date Noted  . Iron deficiency anemia 02/24/2016  . Constipation 01/06/2014  . Major depressive disorder, recurrent, in remission (HCC)   . Paraplegia (HCC) 12/19/2013  . Polyneuropathy due to vitamin B deficiency (HCC) 12/19/2013  . Vitamin B 12 deficiency 12/18/2013  . Protein-calorie malnutrition, severe (HCC) 12/17/2013  . Pancytopenia (HCC) 12/15/2013  . MRSA (methicillin resistant staph aureus) culture positive 07/27/2013    Past Surgical History:  Procedure Laterality Date  . CESAREAN SECTION    . CESAREAN SECTION WITH BILATERAL TUBAL LIGATION       OB History    Gravida  2   Para  2   Term  1   Preterm  1   AB  0   Living  2     SAB  0   IAB  0   Ectopic  0   Multiple  0   Live Births  2        Obstetric Comments  #1Failure to descend, #2 PPROM        Family History  Adopted: Yes    Social History   Tobacco Use  . Smoking status: Never Smoker  . Smokeless tobacco: Never Used  Vaping Use  . Vaping Use: Never used  Substance Use Topics  . Alcohol use: No    Alcohol/week: 2.0 standard drinks    Types: 2  Standard drinks or equivalent per week  . Drug use: No    Home Medications Prior to Admission medications   Medication Sig Start Date End Date Taking? Authorizing Provider  HYDROcodone-acetaminophen (NORCO/VICODIN) 5-325 MG tablet Take 1 tablet by mouth every 6 (six) hours as needed for moderate pain. 06/01/20  Yes Vanetta Mulders, MD  penicillin v potassium (VEETID) 500 MG tablet Take 1 tablet (500 mg total) by mouth 4 (four) times daily for 7 days. 06/01/20 06/08/20 Yes Vanetta Mulders, MD  acetaminophen (TYLENOL 8 HOUR) 650 MG CR tablet Take 1 tablet (650 mg total) by mouth every 8 (eight) hours as needed. 10/05/19   Derwood Kaplan, MD  albuterol (PROVENTIL HFA;VENTOLIN HFA) 108 (90 Base) MCG/ACT inhaler Inhale 2 puffs into the lungs every 6 (six) hours as needed for wheezing or shortness of breath. For asthma 01/20/16   Magrinat, Valentino Hue, MD  buPROPion (WELLBUTRIN XL) 150 MG 24 hr tablet Take 1 tablet (150 mg total) by mouth daily. 01/20/16   Magrinat, Valentino Hue, MD  cyanocobalamin (,VITAMIN B-12,) 1000 MCG/ML injection Inject 1 mL (1,000 mcg total) into the muscle  every 30 (thirty) days. 12/19/13   Dorothea Ogle, MD  methocarbamol (ROBAXIN) 500 MG tablet Take 1 tablet (500 mg total) by mouth 2 (two) times daily. 10/05/19   Derwood Kaplan, MD    Allergies    Peanut-containing drug products, Shellfish allergy, and Aleve [naproxen]  Review of Systems   Review of Systems  Constitutional: Negative for chills and fever.  HENT: Positive for dental problem. Negative for rhinorrhea and sore throat.   Eyes: Negative for visual disturbance.  Respiratory: Negative for cough and shortness of breath.   Cardiovascular: Negative for chest pain and leg swelling.  Gastrointestinal: Negative for abdominal pain, diarrhea, nausea and vomiting.  Genitourinary: Negative for dysuria.  Musculoskeletal: Negative for back pain and neck pain.  Skin: Negative for rash.  Neurological: Positive for headaches.  Negative for dizziness and light-headedness.  Hematological: Does not bruise/bleed easily.  Psychiatric/Behavioral: Negative for confusion.    Physical Exam Updated Vital Signs BP (!) 146/110 (BP Location: Right Arm)   Pulse 78   Temp 99.2 F (37.3 C) (Oral)   Resp 14   Ht 1.575 m (5\' 2" )   Wt 90.7 kg   SpO2 98%   BMI 36.58 kg/m   Physical Exam Vitals and nursing note reviewed.  Constitutional:      General: She is not in acute distress.    Appearance: She is well-developed.  HENT:     Head: Normocephalic and atraumatic.     Mouth/Throat:     Mouth: Mucous membranes are moist.     Pharynx: No oropharyngeal exudate or posterior oropharyngeal erythema.     Comments: Dental caries to both left upper molars.  Some tenderness to palpation to the second molar.  No significant swelling.  No discharge. Eyes:     Extraocular Movements: Extraocular movements intact.     Conjunctiva/sclera: Conjunctivae normal.     Pupils: Pupils are equal, round, and reactive to light.  Cardiovascular:     Rate and Rhythm: Normal rate and regular rhythm.     Heart sounds: No murmur heard.   Pulmonary:     Effort: Pulmonary effort is normal. No respiratory distress.     Breath sounds: Normal breath sounds.  Abdominal:     Palpations: Abdomen is soft.     Tenderness: There is no abdominal tenderness.  Musculoskeletal:     Cervical back: Neck supple.  Skin:    General: Skin is warm and dry.  Neurological:     Mental Status: She is alert. Mental status is at baseline.     ED Results / Procedures / Treatments   Labs (all labs ordered are listed, but only abnormal results are displayed) Labs Reviewed - No data to display  EKG None  Radiology No results found.  Procedures Procedures   Medications Ordered in ED Medications - No data to display  ED Course  I have reviewed the triage vital signs and the nursing notes.  Pertinent labs & imaging results that were available during my  care of the patient were reviewed by me and considered in my medical decision making (see chart for details).    MDM Rules/Calculators/A&P                          Presentation consistent with a dental carry to the left upper molar.  Presumed apical abscess.  Will treat with penicillin short course of pain medicine.  And follow-up with dentist.     Final  Clinical Impression(s) / ED Diagnoses Final diagnoses:  Toothache    Rx / DC Orders ED Discharge Orders         Ordered    penicillin v potassium (VEETID) 500 MG tablet  4 times daily        06/01/20 1033    HYDROcodone-acetaminophen (NORCO/VICODIN) 5-325 MG tablet  Every 6 hours PRN        06/01/20 1033           Vanetta Mulders, MD 06/01/20 1036

## 2020-06-01 NOTE — Discharge Instructions (Signed)
Take the antibiotic as directed for the next 7 days.  Take the pain medicine as needed for severe pain.  Also would be able to take medicines like Motrin to help with the inflammation.  Call Dr. Anda Latina listed above for follow-up.  Return for any new or worse symptoms.

## 2020-06-15 ENCOUNTER — Inpatient Hospital Stay: Payer: Medicare Other | Attending: Oncology

## 2020-06-26 ENCOUNTER — Encounter (HOSPITAL_BASED_OUTPATIENT_CLINIC_OR_DEPARTMENT_OTHER): Payer: Self-pay

## 2020-06-26 ENCOUNTER — Emergency Department (HOSPITAL_BASED_OUTPATIENT_CLINIC_OR_DEPARTMENT_OTHER)
Admission: EM | Admit: 2020-06-26 | Discharge: 2020-06-26 | Disposition: A | Discharge status: Home / Self Care | Payer: Medicare Other | Attending: Emergency Medicine | Admitting: Emergency Medicine

## 2020-06-26 ENCOUNTER — Other Ambulatory Visit: Payer: Self-pay

## 2020-06-26 DIAGNOSIS — L509 Urticaria, unspecified: Secondary | ICD-10-CM

## 2020-06-26 DIAGNOSIS — J45909 Unspecified asthma, uncomplicated: Secondary | ICD-10-CM | POA: Insufficient documentation

## 2020-06-26 DIAGNOSIS — Z91010 Allergy to peanuts: Secondary | ICD-10-CM | POA: Insufficient documentation

## 2020-06-26 DIAGNOSIS — R21 Rash and other nonspecific skin eruption: Secondary | ICD-10-CM | POA: Diagnosis present

## 2020-06-26 MED ORDER — FAMOTIDINE 40 MG PO TABS: 40.0000 mg | ORAL_TABLET | Freq: Every day | ORAL | 0 refills | Status: DC

## 2020-06-26 MED ORDER — PREDNISONE 10 MG PO TABS: ORAL_TABLET | ORAL | 0 refills | Status: DC

## 2020-06-26 MED ORDER — HYDROXYZINE HCL 25 MG PO TABS: 25.0000 mg | ORAL_TABLET | Freq: Four times a day (QID) | ORAL | 0 refills | Status: DC | PRN

## 2020-06-26 NOTE — ED Provider Notes (Signed)
[facility] EMERGENCY DEPT Provider Note   CSN: [id number] Arrival date & time: 06/26/20  1253     History Chief Complaint  Patient presents with  . Urticaria    Jill Summers is a 35 y.o. female.  HPI      36 year old female with history below presents with concern for rash for 1 week.  Reports she had a similar rash in the remote past.  She has had prior work-up with the allergist which revealed she was allergic to "everything".  Denies shortness of breath, voice changes, difficulty swallowing, drooling, nausea, vomiting, abdominal pain, diarrhea, lightheadedness.  She denies any new exposures that she can think of, including no new pets, soaps, detergents or medications.  She tried Benadryl, Zyrtec and Claritin but they only work temporarily, and she continues to have severe symptoms of itching.  Denies fevers or other symptoms. Not near anyone with similar rash.  Past Medical History:  Diagnosis Date  . Adopted   . Asthma    since baby  . Bacterial vaginosis   . Chlamydia   . Depression    At age 72  . MRSA infection   . Obesity   . Urinary tract infection     Patient Active Problem List   Diagnosis Date Noted  . Iron deficiency anemia 02/24/2016  . Constipation 01/06/2014  . Major depressive disorder, recurrent, in remission (HCC)   . Paraplegia (HCC) 12/19/2013  . Polyneuropathy due to vitamin B deficiency (HCC) 12/19/2013  . Vitamin B 12 deficiency 12/18/2013  . Protein-calorie malnutrition, severe (HCC) 12/17/2013  . Pancytopenia (HCC) 12/15/2013  . MRSA (methicillin resistant staph aureus) culture positive 07/27/2013    Past Surgical History:  Procedure Laterality Date  . CESAREAN SECTION    . CESAREAN SECTION WITH BILATERAL TUBAL LIGATION       OB History    Gravida  2   Para  2   Term  1   Preterm  1   AB  0   Living  2     SAB  0   IAB  0   Ectopic  0   Multiple  0   Live Births  2        Obstetric Comments   #1Failure to descend, #2 PPROM        Family History  Adopted: Yes    Social History   Tobacco Use  . Smoking status: Never Smoker  . Smokeless tobacco: Never Used  Vaping Use  . Vaping Use: Never used  Substance Use Topics  . Alcohol use: No    Alcohol/week: 2.0 standard drinks    Types: 2 Standard drinks or equivalent per week  . Drug use: No    Home Medications Prior to Admission medications   Medication Sig Start Date End Date Taking? Authorizing Provider  famotidine (PEPCID) 40 MG tablet Take 1 tablet (40 mg total) by mouth daily for 7 days. 06/26/20 07/03/20 Yes Alvira Monday, MD  hydrOXYzine (ATARAX/VISTARIL) 25 MG tablet Take 1-2 tablets (25-50 mg total) by mouth every 6 (six) hours as needed for anxiety. 06/26/20  Yes Alvira Monday, MD  predniSONE (DELTASONE) 10 MG tablet Take 4 tablets for 4 days, 3 tablets for 2 days, 2 tablets for 3 days, 1 tablet for 4 days 06/26/20  Yes Alvira Monday, MD  acetaminophen (TYLENOL 8 HOUR) 650 MG CR tablet Take 1 tablet (650 mg total) by mouth every 8 (eight) hours as needed. 10/05/19   Derwood Kaplan, MD  albuterol (PROVENTIL HFA;VENTOLIN HFA) 108 (90 Base) MCG/ACT inhaler Inhale 2 puffs into the lungs every 6 (six) hours as needed for wheezing or shortness of breath. For asthma 01/20/16   Magrinat, Valentino Hue, MD  buPROPion (WELLBUTRIN XL) 150 MG 24 hr tablet Take 1 tablet (150 mg total) by mouth daily. 01/20/16   Magrinat, Valentino Hue, MD  cyanocobalamin (,VITAMIN B-12,) 1000 MCG/ML injection Inject 1 mL (1,000 mcg total) into the muscle every 30 (thirty) days. 12/19/13   Dorothea Ogle, MD  HYDROcodone-acetaminophen (NORCO/VICODIN) 5-325 MG tablet Take 1 tablet by mouth every 6 (six) hours as needed for moderate pain. 06/01/20   Vanetta Mulders, MD  methocarbamol (ROBAXIN) 500 MG tablet Take 1 tablet (500 mg total) by mouth 2 (two) times daily. 10/05/19   Derwood Kaplan, MD    Allergies    Peanut-containing drug products, Shellfish  allergy, and Aleve [naproxen]  Review of Systems   Review of Systems  Constitutional: Negative for fever.  HENT: Negative for trouble swallowing and voice change.   Respiratory: Negative for shortness of breath.   Cardiovascular: Negative for chest pain.  Gastrointestinal: Negative for abdominal pain, diarrhea, nausea and vomiting.  Skin: Positive for rash.  Neurological: Negative for syncope and light-headedness.    Physical Exam Updated Vital Signs BP 135/90 (BP Location: Left Arm)   Pulse 86   Temp 98.6 F (37 C) (Oral)   Resp 18   SpO2 98%   Physical Exam Vitals and nursing note reviewed.  Constitutional:      General: She is not in acute distress.    Appearance: She is well-developed. She is not diaphoretic.  HENT:     Head: Normocephalic and atraumatic.     Mouth/Throat:     Mouth: Mucous membranes are moist.     Pharynx: No posterior oropharyngeal erythema.  Eyes:     Conjunctiva/sclera: Conjunctivae normal.  Cardiovascular:     Rate and Rhythm: Normal rate and regular rhythm.     Heart sounds: Normal heart sounds. No murmur heard. No friction rub. No gallop.   Pulmonary:     Effort: Pulmonary effort is normal. No respiratory distress.     Breath sounds: Normal breath sounds. No stridor. No wheezing or rales.  Abdominal:     General: There is no distension.     Palpations: Abdomen is soft.     Tenderness: There is no abdominal tenderness. There is no guarding.  Musculoskeletal:        General: No tenderness.     Cervical back: Normal range of motion.  Skin:    General: Skin is warm and dry.     Findings: Rash (urticaria over bilateral arms, back) present. No erythema.  Neurological:     Mental Status: She is alert and oriented to person, place, and time.     ED Results / Procedures / Treatments   Labs (all labs ordered are listed, but only abnormal results are displayed) Labs Reviewed - No data to display  EKG None  Radiology No results  found.  Procedures Procedures   Medications Ordered in ED Medications - No data to display  ED Course  I have reviewed the triage vital signs and the nursing notes.  Pertinent labs & imaging results that were available during my care of the patient were reviewed by me and considered in my medical decision making (see chart for details).    MDM Rules/Calculators/A&P  35 year old female with history below presents with concern for rash for 1 week. No signs of anaphylaxis. Rash does not have the appearance of SSS, TEN, erythroderma, scabies, RMSF.  Presents with allergic type rash, urticaria with unclear trigger. Recommend steroids, hydroxyzine for itching and H2 blocker.  Final Clinical Impression(s) / ED Diagnoses Final diagnoses:  Urticaria    Rx / DC Order ED Discharge Orders         Ordered    predniSONE (DELTASONE) 10 MG tablet        06/26/20 1335    hydrOXYzine (ATARAX/VISTARIL) 25 MG tablet  Every 6 hours PRN        06/26/20 1335    famotidine (PEPCID) 40 MG tablet  Daily        06/26/20 1335           Alvira Monday, MD 06/29/20 [phone]

## 2020-06-26 NOTE — ED Triage Notes (Signed)
She c/o urticaria x 1 week. She denies any swelling of face/throat, nor any issues with breathing. She denies fever, nor any other sign of current illness. She states she has tried Benadryl (today) and Zyrtec and Claritin "but they only work for about an hour or so".

## 2020-07-12 ENCOUNTER — Telehealth: Payer: Self-pay | Admitting: Oncology

## 2020-07-12 NOTE — Telephone Encounter (Signed)
Scheduled appointment per 06/13 secure chat. Left a detailed message with apt time.

## 2020-07-13 ENCOUNTER — Inpatient Hospital Stay: Payer: Medicare Other

## 2020-07-13 ENCOUNTER — Other Ambulatory Visit: Payer: Self-pay

## 2020-07-13 ENCOUNTER — Inpatient Hospital Stay: Payer: Medicare Other | Attending: Oncology

## 2020-07-13 DIAGNOSIS — R29898 Other symptoms and signs involving the musculoskeletal system: Secondary | ICD-10-CM

## 2020-07-13 DIAGNOSIS — D61818 Other pancytopenia: Secondary | ICD-10-CM

## 2020-07-13 DIAGNOSIS — G8220 Paraplegia, unspecified: Secondary | ICD-10-CM

## 2020-07-13 DIAGNOSIS — D519 Vitamin B12 deficiency anemia, unspecified: Secondary | ICD-10-CM | POA: Insufficient documentation

## 2020-07-13 DIAGNOSIS — E539 Vitamin B deficiency, unspecified: Secondary | ICD-10-CM

## 2020-07-13 DIAGNOSIS — E538 Deficiency of other specified B group vitamins: Secondary | ICD-10-CM

## 2020-07-13 MED ORDER — CYANOCOBALAMIN 1000 MCG/ML IJ SOLN
1000.0000 ug | Freq: Once | INTRAMUSCULAR | Status: AC
  Administered 2020-07-13: 1000 ug via INTRAMUSCULAR

## 2020-07-13 MED ORDER — CYANOCOBALAMIN 1000 MCG/ML IJ SOLN
INTRAMUSCULAR | Status: AC
  Filled 2020-07-13: qty 1

## 2020-07-13 NOTE — Patient Instructions (Signed)
Vitamin B12 Injection What is this medication? Vitamin B12 (VAHY tuh min B12) prevents and treats low vitamin B12 levels in your body. It is used in people who do not get enough vitamin B12 from their diet or when their digestive tract does not absorb enough. Vitamin B12 plays an important role in maintaining the health of your nervous system and red bloodcells. This medicine may be used for other purposes; ask your health care provider orpharmacist if you have questions. COMMON BRAND NAME(S): B-12 Compliance Kit, B-12 Injection Kit, Cyomin, LA-12,Nutri-Twelve, Physicians EZ Use B-12, Primabalt What should I tell my care team before I take this medication? They need to know if you have any of these conditions: Kidney disease Leber's disease Megaloblastic anemia An unusual or allergic reaction to cyanocobalamin, cobalt, other medications, foods, dyes, or preservatives Pregnant or trying to get pregnant Breast-feeding How should I use this medication? This medication is injected into a muscle or deeply under the skin. It is usually given in a clinic or care team's office. However, your care team mayteach you how to inject yourself. Follow all instructions. Talk to your care team about the use of this medication in children. Specialcare may be needed. Overdosage: If you think you have taken too much of this medicine contact apoison control center or emergency room at once. NOTE: This medicine is only for you. Do not share this medicine with others. What if I miss a dose? If you are given your dose at a clinic or care team's office, call to reschedule your appointment. If you give your own injections, and you miss a dose, take it as soon as you can. If it is almost time for your next dose, takeonly that dose. Do not take double or extra doses. What may interact with this medication? Colchicine Heavy alcohol intake This list may not describe all possible interactions. Give your health care provider  a list of all the medicines, herbs, non-prescription drugs, or dietary supplements you use. Also tell them if you smoke, drink alcohol, or use illegaldrugs. Some items may interact with your medicine. What should I watch for while using this medication? Visit your care team regularly. You may need blood work done while you aretaking this medication. You may need to follow a special diet. Talk to your care team. Limit youralcohol intake and avoid smoking to get the best benefit. What side effects may I notice from receiving this medication? Side effects that you should report to your care team as soon as possible: Allergic reactions-skin rash, itching, hives, swelling of the face, lips, tongue, or throat Swelling of the ankles, hands, or feet Trouble breathing Side effects that usually do not require medical attention (report to your careteam if they continue or are bothersome): Diarrhea This list may not describe all possible side effects. Call your doctor for medical advice about side effects. You may report side effects to FDA at1-800-FDA-1088. Where should I keep my medication? Keep out of the reach of children. Store at room temperature between 15 and 30 degrees C (59 and 85 degrees F).Protect from light. Throw away any unused medication after the expiration date. NOTE: This sheet is a summary. It may not cover all possible information. If you have questions about this medicine, talk to your doctor, pharmacist, orhealth care provider.  2022 Elsevier/Gold Standard (2020-03-08 11:47:06)  

## 2020-08-17 ENCOUNTER — Inpatient Hospital Stay: Payer: Medicare Other | Attending: Oncology

## 2020-08-17 ENCOUNTER — Other Ambulatory Visit: Payer: Self-pay

## 2020-08-17 DIAGNOSIS — R29898 Other symptoms and signs involving the musculoskeletal system: Secondary | ICD-10-CM

## 2020-08-17 DIAGNOSIS — E539 Vitamin B deficiency, unspecified: Secondary | ICD-10-CM

## 2020-08-17 DIAGNOSIS — D61818 Other pancytopenia: Secondary | ICD-10-CM

## 2020-08-17 DIAGNOSIS — G8220 Paraplegia, unspecified: Secondary | ICD-10-CM

## 2020-08-17 DIAGNOSIS — E538 Deficiency of other specified B group vitamins: Secondary | ICD-10-CM

## 2020-08-17 DIAGNOSIS — D519 Vitamin B12 deficiency anemia, unspecified: Secondary | ICD-10-CM | POA: Insufficient documentation

## 2020-08-17 DIAGNOSIS — G63 Polyneuropathy in diseases classified elsewhere: Secondary | ICD-10-CM

## 2020-08-17 MED ORDER — CYANOCOBALAMIN 1000 MCG/ML IJ SOLN
INTRAMUSCULAR | Status: AC
  Filled 2020-08-17: qty 1

## 2020-08-17 MED ORDER — CYANOCOBALAMIN 1000 MCG/ML IJ SOLN
1000.0000 ug | Freq: Once | INTRAMUSCULAR | Status: AC
  Administered 2020-08-17: 1000 ug via INTRAMUSCULAR

## 2020-08-17 NOTE — Patient Instructions (Signed)
Cyanocobalamin, Vitamin B12 injection O que  este medicamento? A CIANOCOBALAMINA  uma forma sinttica de vitamina B12. A vitamina B12  essencial para o desenvolvimento de glbulos sanguneos, clulas nervosas e protenas saudveis pelo organismo. Tambm ajuda no metabolismo das gorduras e carboidratos. Este medicamento  usado para tratar pessoas que no conseguem absorver vitamina B12 suficiente. Este medicamento pode ser usado para outros propsitos; em caso de dvidas, pergunte ao seu profissional de sade ou farmacutico. NOMES DE MARCAS COMUNS: B-12 Compliance Kit, B-12 Injection Kit, Cyomin, LA-12, Nutri-Twelve, Physicians EZ Use B-12, Primabalt O que devo dizer a meu profissional de sade antes de tomar este medicamento? Precisam saber se voc tem algum dos seguintes problemas ou estados de sade: doenas renais sndrome ou doena de Leber anemia megaloblstica reao estranha ou alergia  cianocobalamina ou ao cobalto reao estranha ou alergia a outros medicamentos, alimentos, corantes ou conservantes est grvida ou tentando engravidar est amamentando Como devo usar este medicamento? Este medicamento  injetado por via intramuscular ou por injeo subcutnea profunda. Costuma ser administrado por um profissional de sade em consultrio ou Chief of Staff. Porm,  possvel que seu mdico lhe ensine como aplicar suas prprias injees. Siga todas as instrues. Fale com seu pediatra a respeito do uso deste medicamento em crianas. Pode ser preciso tomar alguns cuidados especiais. Superdosagem: Se achar que tomou uma superdosagem deste medicamento, entre em contato imediatamente com o [facility] ou v a [employer]. OBSERVAO: Este medicamento  s para voc. No compartilhe este medicamento com outras pessoas. E se eu deixar de tomar uma dose? Se toma o medicamento em uma clnica ou no consultrio do seu mdico, ligue para Paramedic a Passenger transport manager. Se aplica as  injees por conta prpria e perdeu uma dose, tome-a assim que possvel. Se j estiver quase na hora da sua prxima dose, tome somente essa dose. No tome o remdio em dobro, nem tome uma dose adicional. O que pode interagir com este medicamento? colchicina consumo pesado de lcool Esta lista pode no descrever todas as interaes possveis. D ao seu profissional de sade uma lista de todos os medicamentos, ervas medicinais, remdios de venda livre, ou suplementos alimentares que voc [place]. Diga tambm se voc fuma, bebe, ou [place] drogas ilcitas. Alguns destes podem interagir com o seu medicamento. Ao que devo ficar atento quando estiver [employer] medicamento? Consulte seu mdico ou profissional de sade para acompanhamento regular Museum/gallery curator. Voc precisar fazer exames de sangue peridicos enquanto estiver [employer]. Voc pode precisar seguir uma dieta especial. Fale com seu mdico. Para conseguir o mximo benefcio deste medicamento, limite o seu consumo de lcool e evite fumar. Que efeitos colaterais posso sentir aps usar este medicamento? Efeitos colaterais que devem ser informados ao seu mdico ou profissional de sade o mais rpido possvel: reaes alrgicas, como erupo na pele, coceira, urticria, ou inchao do rosto, dos lbios ou da lngua pele azulada dor ou aperto no peito chiado no peito ou dificuldade para respirar tontura rea vermelha, inchada e dolorosa na perna Efeitos colaterais que normalmente no precisam de cuidados mdicos (avise ao seu mdico ou profissional de sade se persistirem ou forem incmodos): diarreia dor de cabea Esta lista pode no descrever todos os efeitos colaterais possveis. Para mais orientaes sobre efeitos colaterais, consulte o seu mdico. Voc pode relatar a ocorrncia de efeitos colaterais  FDA pelo telefone [phone]. Onde devo guardar meu medicamento? [facility] fora do [employer]. Conservar em [employer], entre 15 e 30 degreesC (  59 e 86 degreesF). Proteger Administrator, arts. Descartar qualquer medicamento no utilizado aps a data de validade impressa no rtulo ou embalagem. OBSERVAO: Este folheto  um resumo. Pode no cobrir todas as informaes possveis. Se tiver dvidas a respeito deste medicamento, fale com seu mdico, farmacutico ou profissional de sade.  2021 Elsevier/Gold Standard (2009-10-20 00:00:00)

## 2020-09-14 ENCOUNTER — Inpatient Hospital Stay: Payer: Medicare Other | Attending: Oncology

## 2020-09-14 ENCOUNTER — Other Ambulatory Visit: Payer: Self-pay

## 2020-09-14 VITALS — BP 119/82 | HR 70 | Temp 98.4°F | Resp 18

## 2020-09-14 DIAGNOSIS — R29898 Other symptoms and signs involving the musculoskeletal system: Secondary | ICD-10-CM

## 2020-09-14 DIAGNOSIS — E539 Vitamin B deficiency, unspecified: Secondary | ICD-10-CM

## 2020-09-14 DIAGNOSIS — D519 Vitamin B12 deficiency anemia, unspecified: Secondary | ICD-10-CM | POA: Insufficient documentation

## 2020-09-14 DIAGNOSIS — D61818 Other pancytopenia: Secondary | ICD-10-CM

## 2020-09-14 DIAGNOSIS — E538 Deficiency of other specified B group vitamins: Secondary | ICD-10-CM

## 2020-09-14 DIAGNOSIS — G8220 Paraplegia, unspecified: Secondary | ICD-10-CM

## 2020-09-14 MED ORDER — CYANOCOBALAMIN 1000 MCG/ML IJ SOLN
INTRAMUSCULAR | Status: AC
  Filled 2020-09-14: qty 1

## 2020-09-14 MED ORDER — CYANOCOBALAMIN 1000 MCG/ML IJ SOLN
1000.0000 ug | Freq: Once | INTRAMUSCULAR | Status: AC
  Administered 2020-09-14: 1000 ug via INTRAMUSCULAR

## 2020-09-14 NOTE — Patient Instructions (Addendum)
Vitamin B12 Injection What is this medication? Vitamin B12 (VAHY tuh min B12) prevents and treats low vitamin B12 levels in your body. It is used in people who do not get enough vitamin B12 from their diet or when their digestive tract does not absorb enough. Vitamin B12 plays an important role in maintaining the health of your nervous system and red bloodcells. This medicine may be used for other purposes; ask your health care provider orpharmacist if you have questions. COMMON BRAND NAME(S): B-12 Compliance Kit, B-12 Injection Kit, Cyomin, LA-12,Nutri-Twelve, Physicians EZ Use B-12, Primabalt What should I tell my care team before I take this medication? They need to know if you have any of these conditions: Kidney disease Leber's disease Megaloblastic anemia An unusual or allergic reaction to cyanocobalamin, cobalt, other medications, foods, dyes, or preservatives Pregnant or trying to get pregnant Breast-feeding How should I use this medication? This medication is injected into a muscle or deeply under the skin. It is usually given in a clinic or care team's office. However, your care team mayteach you how to inject yourself. Follow all instructions. Talk to your care team about the use of this medication in children. Specialcare may be needed. Overdosage: If you think you have taken too much of this medicine contact apoison control center or emergency room at once. NOTE: This medicine is only for you. Do not share this medicine with others. What if I miss a dose? If you are given your dose at a clinic or care team's office, call to reschedule your appointment. If you give your own injections, and you miss a dose, take it as soon as you can. If it is almost time for your next dose, takeonly that dose. Do not take double or extra doses. What may interact with this medication? Colchicine Heavy alcohol intake This list may not describe all possible interactions. Give your health care provider  a list of all the medicines, herbs, non-prescription drugs, or dietary supplements you use. Also tell them if you smoke, drink alcohol, or use illegaldrugs. Some items may interact with your medicine. What should I watch for while using this medication? Visit your care team regularly. You may need blood work done while you aretaking this medication. You may need to follow a special diet. Talk to your care team. Limit youralcohol intake and avoid smoking to get the best benefit. What side effects may I notice from receiving this medication? Side effects that you should report to your care team as soon as possible: Allergic reactions-skin rash, itching, hives, swelling of the face, lips, tongue, or throat Swelling of the ankles, hands, or feet Trouble breathing Side effects that usually do not require medical attention (report to your careteam if they continue or are bothersome): Diarrhea This list may not describe all possible side effects. Call your doctor for medical advice about side effects. You may report side effects to FDA at1-800-FDA-1088. Where should I keep my medication? Keep out of the reach of children. Store at room temperature between 15 and 30 degrees C (59 and 85 degrees F).Protect from light. Throw away any unused medication after the expiration date. NOTE: This sheet is a summary. It may not cover all possible information. If you have questions about this medicine, talk to your doctor, pharmacist, orhealth care provider.  2022 Elsevier/Gold Standard (2020-03-08 11:47:06)  

## 2020-10-12 ENCOUNTER — Other Ambulatory Visit: Payer: Self-pay

## 2020-10-12 ENCOUNTER — Inpatient Hospital Stay: Payer: Medicare Other | Attending: Oncology

## 2020-10-12 DIAGNOSIS — E539 Vitamin B deficiency, unspecified: Secondary | ICD-10-CM

## 2020-10-12 DIAGNOSIS — G8220 Paraplegia, unspecified: Secondary | ICD-10-CM

## 2020-10-12 DIAGNOSIS — D519 Vitamin B12 deficiency anemia, unspecified: Secondary | ICD-10-CM | POA: Insufficient documentation

## 2020-10-12 DIAGNOSIS — D61818 Other pancytopenia: Secondary | ICD-10-CM

## 2020-10-12 DIAGNOSIS — R29898 Other symptoms and signs involving the musculoskeletal system: Secondary | ICD-10-CM

## 2020-10-12 DIAGNOSIS — E538 Deficiency of other specified B group vitamins: Secondary | ICD-10-CM

## 2020-10-12 MED ORDER — CYANOCOBALAMIN 1000 MCG/ML IJ SOLN
1000.0000 ug | Freq: Once | INTRAMUSCULAR | Status: AC
  Administered 2020-10-12: 1000 ug via INTRAMUSCULAR
  Filled 2020-10-12: qty 1

## 2020-11-03 ENCOUNTER — Inpatient Hospital Stay: Payer: Medicare Other

## 2020-11-03 ENCOUNTER — Inpatient Hospital Stay: Payer: Medicare Other | Attending: Oncology

## 2020-11-03 ENCOUNTER — Ambulatory Visit (HOSPITAL_BASED_OUTPATIENT_CLINIC_OR_DEPARTMENT_OTHER): Payer: Medicare Other | Admitting: Oncology

## 2020-11-03 ENCOUNTER — Inpatient Hospital Stay: Payer: Medicare Other | Admitting: Adult Health

## 2020-11-03 DIAGNOSIS — D508 Other iron deficiency anemias: Secondary | ICD-10-CM

## 2020-11-08 ENCOUNTER — Encounter: Payer: Self-pay | Admitting: Oncology

## 2020-11-09 ENCOUNTER — Inpatient Hospital Stay: Payer: Medicare Other

## 2021-02-08 ENCOUNTER — Telehealth: Payer: Self-pay | Admitting: Oncology

## 2021-02-08 NOTE — Telephone Encounter (Signed)
Sch per 1/9 inbasket, left msg °

## 2021-02-18 ENCOUNTER — Inpatient Hospital Stay: Payer: Medicare Other | Attending: Oncology | Admitting: Hematology and Oncology

## 2021-02-18 NOTE — Progress Notes (Deleted)
[facility]  [phone] [phone]    ID: Jill Summers OB: 02-22-1985  MR#: [id number]  CSN#:[id number]  Patient Care Team: Pcp, No as PCP - General Magrinat, Valentino HueGustav C, MD as Consulting Physician (Oncology) OTHER MD:  CHIEF COMPLAINT: vit B12 anemia, subacute combined degeneration, iron deficiency  CURRENT THERAPY:  vit B12 supplementation, Feraheme as needed   INTERVAL HISTORY: Jill Summers returns today for follow-up and treatment of her vitamin B12 anemia, subacute combined degeneration, and iron deficiency.     REVIEW OF SYSTEMS:    HISTORY OF PRESENT ILLNESS: From the earlier summary:  Jill Summers is a right handed female with history of depression and bronchial asthma as well as recent cellulitis of the umbilicus and treated with Bactrim in September 2015 as well as recent MRSA PCR screen +2 months ago as well as previously untreated B-12 deficiency.  Patient lives with children independent prior to admission and her mother has been assisting with care of the children recently. Admitted 12/16/2013 with bouts of dizziness over the past 2 weeks as well as lower strandy weakness and numbness. She denied any falls. No change in bowel or bladder. Cranial CT scan negative. MRI of thoracic lumbar and cervical spine showed multiple bilateral thoracic cord lesions in the lateral white matter most compatible with demyelinating disease of question etiology and workup per neurology services  and felt cord lesions most likely manifestations of B-12 deficiency.   Partial visualization of lower cervical cord shows white matter lesions with full cervical spine films pending. MRI of the brain 12/18/2013 showed less than 10 nonspecific subcentimeter supratentorial white matter T2 hyperintensities in a atypical distribution for classic demyelination. Found to have severe pancytopenia with WBC 1.4, ANC 0.4, hemoglobin 5.5, MCV 101.9, platelets 37,000.    Hematology service consulted (Dr. Darnelle CatalanMagrinat) She was transfused. Etiology is secondary to B-12 deficiency and currently maintained on supplementation with plan to follow-up with hematology services mostly for B-12 injections   She is here for a follow up.   PAST MEDICAL HISTORY: Past Medical History:  Diagnosis Date   Adopted    Asthma    since baby   Bacterial vaginosis    Chlamydia    Depression    At age 36   MRSA infection    Obesity    Urinary tract infection     PAST SURGICAL HISTORY: Past Surgical History:  Procedure Laterality Date   CESAREAN SECTION     CESAREAN SECTION WITH BILATERAL TUBAL LIGATION      FAMILY HISTORY Family History  Adopted: Yes  Mother - hypertension, Father - diabetes   GYNECOLOGIC HISTORY:  Patient has regular menstrual periods. She is status post bilateral tubal ligation. 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  Formerly she cleaned houses.  She lives with 679 y/o son Sheliah HatchCamden Iran Ouch(Birthday 01/2010). Mother Phylliss BlakesMarylin Cleckley lives next door--she is a retired [employer] SW-- with patient's 37 y/o child, [name].  ADVANCED DIRECTIVES: not in place   HEALTH MAINTENANCE: Social History   Tobacco Use   Smoking status: Never   Smokeless tobacco: Never  Vaping Use   Vaping Use: Never used  Substance Use Topics   Alcohol use: No    Alcohol/week: 2.0 standard drinks    Types: 2 Standard drinks or equivalent per week   Drug use: No    Allergies  Allergen  Reactions   Peanut-Containing Drug Products Anaphylaxis, Hives and Swelling    *Throat swelling*   Shellfish Allergy Anaphylaxis, Hives and Swelling    Throat swelling   Aleve [Naproxen]     Current Outpatient Medications  Medication Sig Dispense Refill   acetaminophen (TYLENOL 8 HOUR) 650 MG CR tablet Take 1 tablet (650 mg total) by mouth every 8 (eight) hours as needed. 30  tablet 0   albuterol (PROVENTIL HFA;VENTOLIN HFA) 108 (90 Base) MCG/ACT inhaler Inhale 2 puffs into the lungs every 6 (six) hours as needed for wheezing or shortness of breath. For asthma 1 Inhaler 12   buPROPion (WELLBUTRIN XL) 150 MG 24 hr tablet Take 1 tablet (150 mg total) by mouth daily. 90 tablet 4   cyanocobalamin (,VITAMIN B-12,) 1000 MCG/ML injection Inject 1 mL (1,000 mcg total) into the muscle every 30 (thirty) days. 1 mL 1   famotidine (PEPCID) 40 MG tablet Take 1 tablet (40 mg total) by mouth daily for 7 days. 7 tablet 0   HYDROcodone-acetaminophen (NORCO/VICODIN) 5-325 MG tablet Take 1 tablet by mouth every 6 (six) hours as needed for moderate pain. 12 tablet 0   hydrOXYzine (ATARAX/VISTARIL) 25 MG tablet Take 1-2 tablets (25-50 mg total) by mouth every 6 (six) hours as needed for anxiety. 12 tablet 0   methocarbamol (ROBAXIN) 500 MG tablet Take 1 tablet (500 mg total) by mouth 2 (two) times daily. 10 tablet 0   predniSONE (DELTASONE) 10 MG tablet Take 4 tablets for 4 days, 3 tablets for 2 days, 2 tablets for 3 days, 1 tablet for 4 days 32 tablet 0   No current facility-administered medications for this visit.    OBJECTIVE: African-American woman in no acute distress  There were no vitals filed for this visit.  Wt Readings from Last 3 Encounters:  06/01/20 200 lb (90.7 kg)  11/04/19 208 lb 1.6 oz (94.4 kg)  10/05/19 200 lb (90.7 kg)   There is no height or weight on file to calculate BMI.    ECOG FS:1 - Symptomatic but completely ambulatory  Sclerae unicteric, EOMs intact Wearing a mask No cervical or supraclavicular adenopathy Lungs no rales or rhonchi Heart regular rate and rhythm Abd soft, nontender, positive bowel sounds MSK no focal spinal tenderness, no upper extremity lymphedema Neuro: nonfocal, well oriented, appropriate affect Breasts: Deferred   LAB RESULTS:  CBC    Component Value Date/Time   WBC 7.7 11/04/2019 1013   RBC 4.23 11/04/2019 1013   HGB  12.3 11/04/2019 1013   HGB 12.8 01/29/2018 0831   HGB 12.3 01/20/2016 1404   HCT 37.8 11/04/2019 1013   HCT 37.1 01/20/2016 1404   PLT 241 11/04/2019 1013   PLT 204 01/29/2018 0831   PLT 249 01/20/2016 1404   MCV 89.4 11/04/2019 1013   MCV 85.5 01/20/2016 1404   MCH 29.1 11/04/2019 1013   MCHC 32.5 11/04/2019 1013   RDW 13.4 11/04/2019 1013   RDW 14.1 01/20/2016 1404   LYMPHSABS 3.0 11/04/2019 1013   LYMPHSABS 2.8 01/20/2016 1404   MONOABS 0.4 11/04/2019 1013   MONOABS 0.2 01/20/2016 1404   EOSABS 0.1 11/04/2019 1013   EOSABS 0.1 01/20/2016 1404   BASOSABS 0.0 11/04/2019 1013   BASOSABS 0.0 01/20/2016 1404   CMP     Component Value Date/Time   NA 139 11/04/2019 1013   NA 140 02/04/2015 1049   K 3.8 11/04/2019 1013   K 3.9 02/04/2015 1049   CL 108 11/04/2019 1013  CO2 29 11/04/2019 1013   CO2 23 02/04/2015 1049   GLUCOSE 84 11/04/2019 1013   GLUCOSE 98 02/04/2015 1049   BUN 8 11/04/2019 1013   BUN 9.8 02/04/2015 1049   CREATININE 0.82 11/04/2019 1013   CREATININE 0.85 01/29/2018 0831   CREATININE 0.9 02/04/2015 1049   CALCIUM 9.0 11/04/2019 1013   CALCIUM 8.8 02/04/2015 1049   PROT 6.6 11/04/2019 1013   PROT 6.9 02/04/2015 1049   ALBUMIN 3.3 (L) 11/04/2019 1013   ALBUMIN 3.4 (L) 02/04/2015 1049   AST 12 (L) 11/04/2019 1013   AST 12 (L) 01/29/2018 0831   AST 13 02/04/2015 1049   ALT 9 11/04/2019 1013   ALT 9 01/29/2018 0831   ALT <9 02/04/2015 1049   ALKPHOS 44 11/04/2019 1013   ALKPHOS 53 02/04/2015 1049   BILITOT 0.3 11/04/2019 1013   BILITOT 0.6 01/29/2018 0831   BILITOT 0.47 02/04/2015 1049   GFRNONAA >60 11/04/2019 1013   GFRNONAA >60 01/29/2018 0831   GFRAA >60 11/05/2018 1036   GFRAA >60 01/29/2018 0831        Component Value Date/Time   COLORURINE AMBER (A) 12/16/2013 0707   APPEARANCEUR HAZY (A) 12/16/2013 0707   LABSPEC 1.025 12/16/2013 0707   PHURINE 5.0 12/16/2013 0707   GLUCOSEU NEGATIVE 12/16/2013 0707   HGBUR LARGE (A) 12/16/2013  0707   BILIRUBINUR SMALL (A) 12/16/2013 0707   KETONESUR NEGATIVE 12/16/2013 0707   PROTEINUR NEGATIVE 12/16/2013 0707   UROBILINOGEN >8.0 (H) 12/16/2013 0707   NITRITE NEGATIVE 12/16/2013 0707   LEUKOCYTESUR SMALL (A) 12/16/2013 0707   Iron/TIBC/Ferritin/ %Sat    Component Value Date/Time   IRON 113 12/15/2013 2108   TIBC 207 (L) 12/15/2013 2108   FERRITIN 8 (L) 11/04/2019 1013   FERRITIN 9 12/09/2015 1037   IRONPCTSAT 55 12/15/2013 2108    STUDIES: No results found.   ASSESSMENT: 36 y.o. [place] woman presenting 11/15/2013 with inability to walk, fever and pancytopenia, found to be severely B-12 deficient  (1) B-12 deficiency: will need lifelong replacement; as patient does not have PCP this can be done through our office             (a) received montlhy  (2) subacute combined degeneration - Stable  (3) iron deficiency: ferritin level on 02/04/15 was 6. Feraheme given on 03/11/15 and 03/19/15.    (a) iron level less than 10 on 01/20/2016. Feraheme given 02/24/2016                   PLAN:  Jill Summers is doing very well as far as her B12 deficiency is concerned and we are continuing B12 doses here on a monthly basis indefinitely.  We are also checking a ferritin today as she is continuing to have regular periods.  If low she will receive Feraheme.  I commended her new exercise program.  I reminded her to be particularly careful regarding falls given her history of subacute combined degeneration.  She will see me again in a year.  She knows to call for any other issue that may develop before then  Total encounter time 20 minutes.   Magrinat, Valentino HueGustav C, MD  02/18/21 7:53 AM Medical Oncology and Hematology. [facility] [address] [place], [place] [address] Tel. [phone]    Fax. [phone]   I, Mickie BailKatie Daubenspeck, am acting as scribe for Dr. Valentino HueGustav C. Magrinat.  Jill GageI, Gustav Magrinat MD, have reviewed the above documentation for accuracy and  completeness, and I agree  with the above.    *Total Encounter Time as defined by the Centers for Medicare and Medicaid Services includes, in addition to the face-to-face time of a patient visit (documented in the note above) non-face-to-face time: obtaining and reviewing outside history, ordering and reviewing medications, tests or procedures, care coordination (communications with other health care professionals or caregivers) and documentation in the medical record.

## 2021-02-26 ENCOUNTER — Other Ambulatory Visit: Payer: Self-pay

## 2021-02-26 ENCOUNTER — Emergency Department (HOSPITAL_BASED_OUTPATIENT_CLINIC_OR_DEPARTMENT_OTHER)
Admission: EM | Admit: 2021-02-26 | Discharge: 2021-02-26 | Disposition: A | Discharge status: Home / Self Care | Payer: Medicare Other | Attending: Emergency Medicine | Admitting: Emergency Medicine

## 2021-02-26 ENCOUNTER — Encounter (HOSPITAL_BASED_OUTPATIENT_CLINIC_OR_DEPARTMENT_OTHER): Payer: Self-pay

## 2021-02-26 DIAGNOSIS — Z91010 Allergy to peanuts: Secondary | ICD-10-CM

## 2021-02-26 DIAGNOSIS — Z91013 Allergy to seafood: Secondary | ICD-10-CM | POA: Insufficient documentation

## 2021-02-26 DIAGNOSIS — L509 Urticaria, unspecified: Secondary | ICD-10-CM

## 2021-02-26 DIAGNOSIS — T7840XA Allergy, unspecified, initial encounter: Secondary | ICD-10-CM | POA: Diagnosis not present

## 2021-02-26 DIAGNOSIS — T7802XA Anaphylactic reaction due to shellfish (crustaceans), initial encounter: Secondary | ICD-10-CM | POA: Diagnosis not present

## 2021-02-26 DIAGNOSIS — J45909 Unspecified asthma, uncomplicated: Secondary | ICD-10-CM | POA: Insufficient documentation

## 2021-02-26 MED ORDER — PREDNISONE 20 MG PO TABS: 20.0000 mg | ORAL_TABLET | Freq: Every day | ORAL | 0 refills | Status: AC

## 2021-02-26 MED ORDER — FAMOTIDINE 20 MG PO TABS
20.0000 mg | ORAL_TABLET | Freq: Once | ORAL | Status: AC
  Administered 2021-02-26: 20 mg via ORAL
  Filled 2021-02-26: qty 1

## 2021-02-26 MED ORDER — PREDNISONE 20 MG PO TABS: 20.0000 mg | ORAL_TABLET | Freq: Every day | ORAL | 0 refills | Status: DC

## 2021-02-26 MED ORDER — DEXAMETHASONE SODIUM PHOSPHATE 10 MG/ML IJ SOLN
10.0000 mg | Freq: Once | INTRAMUSCULAR | Status: AC
  Administered 2021-02-26: 10 mg
  Filled 2021-02-26: qty 1

## 2021-02-26 MED ORDER — LORATADINE 10 MG PO TABS: 10.0000 mg | ORAL_TABLET | Freq: Every day | ORAL | 0 refills | Status: DC

## 2021-02-26 MED ORDER — FAMOTIDINE 20 MG PO TABS: 20.0000 mg | ORAL_TABLET | Freq: Two times a day (BID) | ORAL | 0 refills | Status: DC

## 2021-02-26 MED ORDER — IPRATROPIUM-ALBUTEROL 0.5-2.5 (3) MG/3ML IN SOLN
RESPIRATORY_TRACT | Status: AC
  Administered 2021-02-26: 3 mL via RESPIRATORY_TRACT
  Filled 2021-02-26: qty 3

## 2021-02-26 MED ORDER — IPRATROPIUM-ALBUTEROL 0.5-2.5 (3) MG/3ML IN SOLN
3.0000 mL | Freq: Once | RESPIRATORY_TRACT | Status: AC
  Administered 2021-02-26: 3 mL via RESPIRATORY_TRACT

## 2021-02-26 MED ORDER — IPRATROPIUM-ALBUTEROL 0.5-2.5 (3) MG/3ML IN SOLN
3.0000 mL | Freq: Once | RESPIRATORY_TRACT | Status: AC
  Filled 2021-02-26: qty 3

## 2021-02-26 MED ORDER — LORATADINE 10 MG PO TABS
10.0000 mg | ORAL_TABLET | Freq: Once | ORAL | Status: AC
  Administered 2021-02-26: 10 mg via ORAL
  Filled 2021-02-26: qty 1

## 2021-02-26 MED ORDER — ALBUTEROL SULFATE (5 MG/ML) 0.5% IN NEBU: 2.5000 mg | INHALATION_SOLUTION | RESPIRATORY_TRACT | 12 refills | Status: DC | PRN

## 2021-02-26 NOTE — ED Provider Notes (Signed)
[facility] EMERGENCY DEPT Provider Note   CSN: [id number] Arrival date & time: 02/26/21  0702     History  Chief Complaint  Patient presents with   Asthma   Rash    Jill Summers is a 36 y.o. female.  Pt is a 36 yo female with pmh of shell fish allergy, peanut allergy, and asthma presenting for wheezing and hives x 4 days. Hives are on trunk, back, and upper extremities. Pruritic in nature. Denies nausea, vomiting, flushing, throat/tongue swelling, or dib.   The history is provided by the patient. No language interpreter was used.  Asthma Pertinent negatives include no chest pain, no abdominal pain and no shortness of breath.  Rash Associated symptoms: wheezing   Associated symptoms: no abdominal pain, no fever, no joint pain, no shortness of breath, no sore throat and not vomiting       Home Medications Prior to Admission medications   Medication Sig Start Date End Date Taking? Authorizing Provider  acetaminophen (TYLENOL 8 HOUR) 650 MG CR tablet Take 1 tablet (650 mg total) by mouth every 8 (eight) hours as needed. 10/05/19   Varney Biles, MD  albuterol (PROVENTIL HFA;VENTOLIN HFA) 108 (90 Base) MCG/ACT inhaler Inhale 2 puffs into the lungs every 6 (six) hours as needed for wheezing or shortness of breath. For asthma 01/20/16   Magrinat, Virgie Dad, MD  buPROPion (WELLBUTRIN XL) 150 MG 24 hr tablet Take 1 tablet (150 mg total) by mouth daily. 01/20/16   Magrinat, Virgie Dad, MD  cyanocobalamin (,VITAMIN B-12,) 1000 MCG/ML injection Inject 1 mL (1,000 mcg total) into the muscle every 30 (thirty) days. 12/19/13   Theodis Blaze, MD  famotidine (PEPCID) 40 MG tablet Take 1 tablet (40 mg total) by mouth daily for 7 days. 06/26/20 07/03/20  Gareth Morgan, MD  HYDROcodone-acetaminophen (NORCO/VICODIN) 5-325 MG tablet Take 1 tablet by mouth every 6 (six) hours as needed for moderate pain. 06/01/20   Fredia Sorrow, MD  hydrOXYzine (ATARAX/VISTARIL) 25 MG tablet Take  1-2 tablets (25-50 mg total) by mouth every 6 (six) hours as needed for anxiety. 06/26/20   Gareth Morgan, MD  methocarbamol (ROBAXIN) 500 MG tablet Take 1 tablet (500 mg total) by mouth 2 (two) times daily. 10/05/19   Varney Biles, MD  predniSONE (DELTASONE) 10 MG tablet Take 4 tablets for 4 days, 3 tablets for 2 days, 2 tablets for 3 days, 1 tablet for 4 days 06/26/20   Gareth Morgan, MD      Allergies    Peanut-containing drug products, Shellfish allergy, and Aleve [naproxen]    Review of Systems   Review of Systems  Constitutional:  Negative for chills and fever.  HENT:  Negative for ear pain and sore throat.   Eyes:  Negative for pain and visual disturbance.  Respiratory:  Positive for wheezing. Negative for cough and shortness of breath.   Cardiovascular:  Negative for chest pain and palpitations.  Gastrointestinal:  Negative for abdominal pain and vomiting.  Genitourinary:  Negative for dysuria and hematuria.  Musculoskeletal:  Negative for arthralgias and back pain.  Skin:  Positive for rash. Negative for color change.  Neurological:  Negative for seizures and syncope.  All other systems reviewed and are negative.  Physical Exam Updated Vital Signs BP (!) 129/118 (BP Location: Right Arm)    Pulse 87    Temp 98.6 F (37 C) (Oral)    Resp (!) 22    LMP  (LMP Unknown)    SpO2 95%  Physical Exam Vitals and nursing note reviewed.  Constitutional:      General: She is not in acute distress.    Appearance: She is well-developed.  HENT:     Head: Normocephalic and atraumatic.  Eyes:     Conjunctiva/sclera: Conjunctivae normal.  Cardiovascular:     Rate and Rhythm: Normal rate and regular rhythm.     Heart sounds: No murmur heard. Pulmonary:     Effort: Pulmonary effort is normal. No respiratory distress.     Breath sounds: Examination of the right-upper field reveals wheezing. Examination of the left-upper field reveals wheezing. Examination of the right-middle field  reveals wheezing. Examination of the left-middle field reveals wheezing. Examination of the right-lower field reveals wheezing. Examination of the left-lower field reveals wheezing. Wheezing present.  Abdominal:     Palpations: Abdomen is soft.     Tenderness: There is no abdominal tenderness.  Musculoskeletal:        General: No swelling.     Cervical back: Neck supple.  Skin:    General: Skin is warm and dry.     Capillary Refill: Capillary refill takes less than 2 seconds.     Findings: Rash present.  Neurological:     Mental Status: She is alert.  Psychiatric:        Mood and Affect: Mood normal.    ED Results / Procedures / Treatments   Labs (all labs ordered are listed, but only abnormal results are displayed) Labs Reviewed - No data to display  EKG None  Radiology No results found.  Procedures Procedures    Medications Ordered in ED Medications  ipratropium-albuterol (DUONEB) 0.5-2.5 (3) MG/3ML nebulizer solution (  Not Given 02/26/21 0721)  loratadine (CLARITIN) tablet 10 mg (has no administration in time range)  famotidine (PEPCID) tablet 20 mg (has no administration in time range)  dexamethasone (DECADRON) injection 10 mg (has no administration in time range)  ipratropium-albuterol (DUONEB) 0.5-2.5 (3) MG/3ML nebulizer solution 3 mL (3 mLs Nebulization Given 02/26/21 [id number])    ED Course/ Medical Decision Making/ A&P                           Medical Decision Making Risk OTC drugs. Prescription drug management.   7:50 AM 36 yo female with pmh of shell fish allergy, peanut allergy, and asthma presenting for wheezing and hives x 4 days. No signs of respiratory distress or oral swelling. Duonebs given with improvement of wheezing. Loratadine, pepcid, and prednisone given in Ed. Prescriptions sent to pharmacy. Pt has unused epi pen in her possession.   Patient in no distress and overall condition improved here in the ED. Detailed discussions were had with the  patient regarding current findings, and need for close f/u with PCP or on call doctor. The patient has been instructed to return immediately if the symptoms worsen in any way for re-evaluation. Patient verbalized understanding and is in agreement with current care plan. All questions answered prior to discharge.         Final Clinical Impression(s) / ED Diagnoses Final diagnoses:  Allergic reaction, initial encounter  Hives  Hx of peanut allergy  Hx of allergy to shellfish    Rx / DC Orders ED Discharge Orders     None         Lianne Cure, DO [id number] [phone]

## 2021-02-26 NOTE — ED Triage Notes (Signed)
She reports widespread small pruritic "hives" x 1 week. She also reports wheezing; which is not uncommon, as she has known asthma. She has been self-administering home nebs and received a neb tx here in triage. She is ambulatory and in no distress.

## 2021-03-25 ENCOUNTER — Inpatient Hospital Stay: Payer: Medicare Other | Attending: Oncology | Admitting: Hematology and Oncology

## 2021-03-25 ENCOUNTER — Inpatient Hospital Stay: Payer: Medicare Other

## 2021-03-25 ENCOUNTER — Other Ambulatory Visit: Payer: Self-pay

## 2021-03-25 ENCOUNTER — Encounter: Payer: Self-pay | Admitting: Hematology and Oncology

## 2021-03-25 VITALS — BP 140/81 | HR 83 | Temp 97.9°F | Resp 16 | Ht 62.0 in | Wt 213.4 lb

## 2021-03-25 DIAGNOSIS — Z886 Allergy status to analgesic agent status: Secondary | ICD-10-CM | POA: Diagnosis not present

## 2021-03-25 DIAGNOSIS — G63 Polyneuropathy in diseases classified elsewhere: Secondary | ICD-10-CM | POA: Diagnosis not present

## 2021-03-25 DIAGNOSIS — G320 Subacute combined degeneration of spinal cord in diseases classified elsewhere: Secondary | ICD-10-CM | POA: Insufficient documentation

## 2021-03-25 DIAGNOSIS — R2689 Other abnormalities of gait and mobility: Secondary | ICD-10-CM | POA: Insufficient documentation

## 2021-03-25 DIAGNOSIS — D508 Other iron deficiency anemias: Secondary | ICD-10-CM

## 2021-03-25 DIAGNOSIS — Z79899 Other long term (current) drug therapy: Secondary | ICD-10-CM | POA: Insufficient documentation

## 2021-03-25 DIAGNOSIS — E538 Deficiency of other specified B group vitamins: Secondary | ICD-10-CM

## 2021-03-25 DIAGNOSIS — Z8249 Family history of ischemic heart disease and other diseases of the circulatory system: Secondary | ICD-10-CM | POA: Diagnosis not present

## 2021-03-25 DIAGNOSIS — Z87440 Personal history of urinary (tract) infections: Secondary | ICD-10-CM | POA: Insufficient documentation

## 2021-03-25 DIAGNOSIS — D519 Vitamin B12 deficiency anemia, unspecified: Secondary | ICD-10-CM | POA: Diagnosis not present

## 2021-03-25 DIAGNOSIS — E611 Iron deficiency: Secondary | ICD-10-CM | POA: Diagnosis not present

## 2021-03-25 DIAGNOSIS — D61818 Other pancytopenia: Secondary | ICD-10-CM | POA: Insufficient documentation

## 2021-03-25 DIAGNOSIS — E539 Vitamin B deficiency, unspecified: Secondary | ICD-10-CM

## 2021-03-25 DIAGNOSIS — R5383 Other fatigue: Secondary | ICD-10-CM | POA: Diagnosis not present

## 2021-03-25 DIAGNOSIS — Z833 Family history of diabetes mellitus: Secondary | ICD-10-CM | POA: Diagnosis not present

## 2021-03-25 LAB — CBC WITH DIFFERENTIAL/PLATELET
Abs Immature Granulocytes: 0.02 10*3/uL (ref 0.00–0.07)
Basophils Absolute: 0 10*3/uL (ref 0.0–0.1)
Basophils Relative: 0 %
Eosinophils Absolute: 0.2 10*3/uL (ref 0.0–0.5)
Eosinophils Relative: 3 %
HCT: 32.9 % — ABNORMAL LOW (ref 36.0–46.0)
Hemoglobin: 10.3 g/dL — ABNORMAL LOW (ref 12.0–15.0)
Immature Granulocytes: 0 %
Lymphocytes Relative: 29 %
Lymphs Abs: 1.7 10*3/uL (ref 0.7–4.0)
MCH: 24.5 pg — ABNORMAL LOW (ref 26.0–34.0)
MCHC: 31.3 g/dL (ref 30.0–36.0)
MCV: 78.1 fL — ABNORMAL LOW (ref 80.0–100.0)
Monocytes Absolute: 0.4 10*3/uL (ref 0.1–1.0)
Monocytes Relative: 7 %
Neutro Abs: 3.5 10*3/uL (ref 1.7–7.7)
Neutrophils Relative %: 61 %
Platelets: 303 10*3/uL (ref 150–400)
RBC: 4.21 MIL/uL (ref 3.87–5.11)
RDW: 16.9 % — ABNORMAL HIGH (ref 11.5–15.5)
WBC: 5.8 10*3/uL (ref 4.0–10.5)
nRBC: 0 % (ref 0.0–0.2)

## 2021-03-25 LAB — COMPREHENSIVE METABOLIC PANEL
ALT: 10 U/L (ref 0–44)
AST: 12 U/L — ABNORMAL LOW (ref 15–41)
Albumin: 3.7 g/dL (ref 3.5–5.0)
Alkaline Phosphatase: 43 U/L (ref 38–126)
Anion gap: 5 (ref 5–15)
BUN: 10 mg/dL (ref 6–20)
CO2: 27 mmol/L (ref 22–32)
Calcium: 8.9 mg/dL (ref 8.9–10.3)
Chloride: 108 mmol/L (ref 98–111)
Creatinine, Ser: 0.85 mg/dL (ref 0.44–1.00)
GFR, Estimated: >60 mL/min (ref >60)
Glucose, Bld: 98 mg/dL (ref 70–99)
Potassium: 3.9 mmol/L (ref 3.5–5.1)
Sodium: 140 mmol/L (ref 135–145)
Total Bilirubin: 0.4 mg/dL (ref 0.3–1.2)
Total Protein: 6.9 g/dL (ref 6.5–8.1)

## 2021-03-25 LAB — FERRITIN: Ferritin: <4 ng/mL — ABNORMAL LOW (ref 11–307)

## 2021-03-25 LAB — VITAMIN B12: Vitamin B-12: >7500 pg/mL — ABNORMAL HIGH (ref 180–914)

## 2021-03-25 LAB — IRON AND IRON BINDING CAPACITY (CC-WL,HP ONLY)
Iron: 22 ug/dL — ABNORMAL LOW (ref 28–170)
Saturation Ratios: 5 % — ABNORMAL LOW (ref 10.4–31.8)
TIBC: 426 ug/dL (ref 250–450)
UIBC: 404 ug/dL (ref 148–442)

## 2021-03-25 MED ORDER — CYANOCOBALAMIN 1000 MCG/ML IJ SOLN
1000.0000 ug | Freq: Once | INTRAMUSCULAR | Status: AC
  Administered 2021-03-25: 1000 ug via INTRAMUSCULAR

## 2021-03-25 NOTE — Progress Notes (Signed)
[facility]  [phone] [phone]) [phone]    ID: Jill Summers OB: January 25, 1986  MR#: [id number]  QZR#:[id number]  Patient Care Team: Pcp, No as PCP - General Magrinat, Valentino Hue, MD (Inactive) as Consulting Physician (Oncology) OTHER MD:  CHIEF COMPLAINT: vit B12 anemia, subacute combined degeneration, iron deficiency  CURRENT THERAPY:  vit B12 supplementation, Feraheme as needed   INTERVAL HISTORY:  Jill Summers returns today for follow-up and treatment of her vitamin B12 anemia, subacute combined degeneration, and iron deficiency.  She says she stopped coming to the appointments and taking B12 injections.  She did not even know Dr. Darnelle Catalan has retired.  Since his last visit with her, she feels that her balance is a bit off again.  She still can do everything she is able to but sometimes she cannot quite tell where her leg is.  She does not take any oral iron supplements.  She also had history of iron deficiency.  She has gained about 20 pounds of weight since she last came.  She has been eating healthy for the most part but also loves sweets.  She has not been very active.  No unusual fatigue.  No other change in breathing or bowel habits or urinary habits reported. Rest of the pertinent 10 point ROS reviewed and negative.   REVIEW OF SYSTEMS:  A detailed review of systems today was otherwise stable   HISTORY OF PRESENT ILLNESS: From the earlier summary:  Jill Summers is a right handed female with history of depression and bronchial asthma as well as recent cellulitis of the umbilicus and treated with Bactrim in September 2015 as well as recent MRSA PCR screen +2 months ago as well as previously untreated B-12 deficiency.  Patient lives with children independent prior to admission and her mother has been assisting with care of the children recently. Admitted 12/16/2013 with bouts of dizziness over the past 2 weeks as well as lower strandy weakness and  numbness. She denied any falls. No change in bowel or bladder. Cranial CT scan negative. MRI of thoracic lumbar and cervical spine showed multiple bilateral thoracic cord lesions in the lateral white matter most compatible with demyelinating disease of question etiology and workup ongoing as per neurology services  and felt cord lesions most likely manifestations of B-12 deficiency. Partial visualization of lower cervical cord shows white matter lesions with full cervical spine films pending. MRI of the brain 12/18/2013 showed less than 10 nonspecific subcentimeter supratentorial white matter T2 hyperintensities in a atypical distribution for classic demyelination. Found to have severe pancytopenia with WBC 1.4, ANC 0.4, hemoglobin 5.5, MCV 101.9, platelets 37,000. Hematology service consulted (Dr. Darnelle Catalan) She was transfused. Etiology is secondary to B-12 deficiency and currently maintained on supplementation with plan to follow-up with hematology services mostly for B-12 injections    PAST MEDICAL HISTORY: Past Medical History:  Diagnosis Date   Adopted    Asthma    since baby   Bacterial vaginosis    Chlamydia    Depression    At age 36   MRSA infection    Obesity    Urinary tract infection     PAST SURGICAL HISTORY: Past Surgical History:  Procedure Laterality Date   CESAREAN SECTION     CESAREAN SECTION WITH BILATERAL TUBAL LIGATION      FAMILY HISTORY Family History  Adopted: Yes  Mother - hypertension, Father - diabetes   GYNECOLOGIC HISTORY:  Patient has regular menstrual periods. She is status post  bilateral tubal ligation. 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  Formerly she cleaned houses.  She lives with 65 y/o son Sheliah Hatch [place] Ouch 01/2010). Mother Avleen Bordwell lives next door--she is a retired Arts administrator SW-- with patient's 38 y/o child, [name].  ADVANCED  DIRECTIVES: not in place   HEALTH MAINTENANCE: Social History   Tobacco Use   Smoking status: Never   Smokeless tobacco: Never  Vaping Use   Vaping Use: Never used  Substance Use Topics   Alcohol use: No    Alcohol/week: 2.0 standard drinks    Types: 2 Standard drinks or equivalent per week   Drug use: No    Allergies  Allergen Reactions   Peanut-Containing Drug Products Anaphylaxis, Hives and Swelling    *Throat swelling*   Shellfish Allergy Anaphylaxis, Hives and Swelling    Throat swelling   Aleve [Naproxen]     Current Outpatient Medications  Medication Sig Dispense Refill   acetaminophen (TYLENOL 8 HOUR) 650 MG CR tablet Take 1 tablet (650 mg total) by mouth every 8 (eight) hours as needed. 30 tablet 0   albuterol (PROVENTIL HFA;VENTOLIN HFA) 108 (90 Base) MCG/ACT inhaler Inhale 2 puffs into the lungs every 6 (six) hours as needed for wheezing or shortness of breath. For asthma 1 Inhaler 12   albuterol (PROVENTIL) (5 MG/ML) 0.5% nebulizer solution Take 0.5 mLs (2.5 mg total) by nebulization every 4 (four) hours as needed for wheezing or shortness of breath. 20 mL 12   cyanocobalamin (,VITAMIN B-12,) 1000 MCG/ML injection Inject 1 mL (1,000 mcg total) into the muscle every 30 (thirty) days. 1 mL 1   hydrOXYzine (ATARAX/VISTARIL) 25 MG tablet Take 1-2 tablets (25-50 mg total) by mouth every 6 (six) hours as needed for anxiety. 12 tablet 0   loratadine (CLARITIN) 10 MG tablet Take 1 tablet (10 mg total) by mouth daily for 14 days. 14 tablet 0   Current Facility-Administered Medications  Medication Dose Route Frequency Provider Last Rate Last Admin   cyanocobalamin ((VITAMIN B-12)) injection 1,000 mcg  1,000 mcg Intramuscular Once Rachel Moulds, MD        OBJECTIVE: African-American woman in no acute distress  Vitals:   03/25/21 0847  BP: 140/81  Pulse: 83  Resp: 16  Temp: 97.9 F (36.6 C)  SpO2: 100%    Wt Readings from Last 3 Encounters:  03/25/21 213 lb 6.4  oz (96.8 kg)  06/01/20 200 lb (90.7 kg)  11/04/19 208 lb 1.6 oz (94.4 kg)   Body mass index is 39.03 kg/m.    ECOG FS:1 - Symptomatic but completely ambulatory  Physical Exam Constitutional:      Appearance: Normal appearance.  HENT:     Head: Normocephalic and atraumatic.  Musculoskeletal:        General: Normal range of motion.  Skin:    General: Skin is warm and dry.  Neurological:     General: No focal deficit present.     Mental Status: She is alert.     Sensory: No sensory deficit.     Motor: No weakness.     Coordination: Coordination normal.     Gait: Gait normal.     Comments: Proprioception is normal in lower extremities    LAB RESULTS:  CBC    Component Value Date/Time   WBC 7.7 11/04/2019 1013   RBC 4.23 11/04/2019 1013  HGB 12.3 11/04/2019 1013   HGB 12.8 01/29/2018 0831   HGB 12.3 01/20/2016 1404   HCT 37.8 11/04/2019 1013   HCT 37.1 01/20/2016 1404   PLT 241 11/04/2019 1013   PLT 204 01/29/2018 0831   PLT 249 01/20/2016 1404   MCV 89.4 11/04/2019 1013   MCV 85.5 01/20/2016 1404   MCH 29.1 11/04/2019 1013   MCHC 32.5 11/04/2019 1013   RDW 13.4 11/04/2019 1013   RDW 14.1 01/20/2016 1404   LYMPHSABS 3.0 11/04/2019 1013   LYMPHSABS 2.8 01/20/2016 1404   MONOABS 0.4 11/04/2019 1013   MONOABS 0.2 01/20/2016 1404   EOSABS 0.1 11/04/2019 1013   EOSABS 0.1 01/20/2016 1404   BASOSABS 0.0 11/04/2019 1013   BASOSABS 0.0 01/20/2016 1404   CMP     Component Value Date/Time   NA 139 11/04/2019 1013   NA 140 02/04/2015 1049   K 3.8 11/04/2019 1013   K 3.9 02/04/2015 1049   CL 108 11/04/2019 1013   CO2 29 11/04/2019 1013   CO2 23 02/04/2015 1049   GLUCOSE 84 11/04/2019 1013   GLUCOSE 98 02/04/2015 1049   BUN 8 11/04/2019 1013   BUN 9.8 02/04/2015 1049   CREATININE 0.82 11/04/2019 1013   CREATININE 0.85 01/29/2018 0831   CREATININE 0.9 02/04/2015 1049   CALCIUM 9.0 11/04/2019 1013   CALCIUM 8.8 02/04/2015 1049   PROT 6.6 11/04/2019 1013    PROT 6.9 02/04/2015 1049   ALBUMIN 3.3 (L) 11/04/2019 1013   ALBUMIN 3.4 (L) 02/04/2015 1049   AST 12 (L) 11/04/2019 1013   AST 12 (L) 01/29/2018 0831   AST 13 02/04/2015 1049   ALT 9 11/04/2019 1013   ALT 9 01/29/2018 0831   ALT <9 02/04/2015 1049   ALKPHOS 44 11/04/2019 1013   ALKPHOS 53 02/04/2015 1049   BILITOT 0.3 11/04/2019 1013   BILITOT 0.6 01/29/2018 0831   BILITOT 0.47 02/04/2015 1049   GFRNONAA >60 11/04/2019 1013   GFRNONAA >60 01/29/2018 0831   GFRAA >60 11/05/2018 1036   GFRAA >60 01/29/2018 0831        Component Value Date/Time   COLORURINE AMBER (A) 12/16/2013 0707   APPEARANCEUR HAZY (A) 12/16/2013 0707   LABSPEC 1.025 12/16/2013 0707   PHURINE 5.0 12/16/2013 0707   GLUCOSEU NEGATIVE 12/16/2013 0707   HGBUR LARGE (A) 12/16/2013 0707   BILIRUBINUR SMALL (A) 12/16/2013 0707   KETONESUR NEGATIVE 12/16/2013 0707   PROTEINUR NEGATIVE 12/16/2013 0707   UROBILINOGEN >8.0 (H) 12/16/2013 0707   NITRITE NEGATIVE 12/16/2013 0707   LEUKOCYTESUR SMALL (A) 12/16/2013 0707   Iron/TIBC/Ferritin/ %Sat    Component Value Date/Time   IRON 113 12/15/2013 2108   TIBC 207 (L) 12/15/2013 2108   FERRITIN 8 (L) 11/04/2019 1013   FERRITIN 9 12/09/2015 1037   IRONPCTSAT 55 12/15/2013 2108    STUDIES: No results found.   ASSESSMENT: 36 y.o. [place] woman presenting 11/15/2013 with inability to walk, fever and pancytopenia, found to be severely B-12 deficient  (1) B-12 deficiency: will need lifelong replacement; as patient does not have PCP this can be done through our office             (a) received montlhy  (2) subacute combined degeneration - Stable  (3) iron deficiency: ferritin level on 02/04/15 was 6. Feraheme given on 03/11/15 and 03/19/15.    (a) iron level less than 10 on 01/20/2016. Feraheme given 02/24/2016  PLAN:   Ms. Sonny MastersCandace is here for follow-up.  Since her last visit, she has missed a few monthly injections of B12.  She complains of  some loss of balance and stumbling since her last visit.  On physical examination today, no motor or sensory abnormalities noted.  No impairment of proprioception noted.  Vibration could not be tested. I have recommended that she continue B12 replacement every month as Dr. Darnelle CatalanMagrinat recommended because of her history of subacute combined degeneration and possibility that she does not absorb oral vitamin B12 well. With regards to her iron deficiency, I have recommended that we repeat labs CBC, iron binding panel and ferritin today.  She last received Feraheme in 2018. She should make follow-up appointments monthly and return to clinic with us in 3 months.  Total encounter time 30 minutes. This is a new patient to me, transitioning given Dr. Darrall DearsMagrinat's retirement.  *Total Encounter Time as defined by the Centers for Medicare and Medicaid Services includes, in addition to the face-to-face time of a patient visit (documented in the note above) non-face-to-face time: obtaining and reviewing outside history, ordering and reviewing medications, tests or procedures, care coordination (communications with other health care professionals or caregivers) and documentation in the medical record.  Rachel MouldsPraveena Luara Faye MD

## 2021-03-29 ENCOUNTER — Other Ambulatory Visit: Payer: Self-pay | Admitting: Hematology and Oncology

## 2021-03-29 NOTE — Progress Notes (Signed)
Ferraheme orders placed.  Jill Summers

## 2021-03-30 ENCOUNTER — Telehealth: Payer: Self-pay | Admitting: *Deleted

## 2021-03-30 NOTE — Telephone Encounter (Addendum)
-----   Message from Benay Pike, MD sent at 03/29/2021  1:03 PM EST ----- ?She is iron deficient again looks like, we can try feraheme again ?Can you let her know that I ordered it. ? ?Called and informed the patient with verbalized understanding of plan. ?

## 2021-04-22 ENCOUNTER — Inpatient Hospital Stay: Payer: Medicare Other | Attending: Oncology

## 2021-04-22 ENCOUNTER — Other Ambulatory Visit: Payer: Self-pay

## 2021-04-22 DIAGNOSIS — G8220 Paraplegia, unspecified: Secondary | ICD-10-CM

## 2021-04-22 DIAGNOSIS — E539 Vitamin B deficiency, unspecified: Secondary | ICD-10-CM

## 2021-04-22 DIAGNOSIS — E538 Deficiency of other specified B group vitamins: Secondary | ICD-10-CM

## 2021-04-22 DIAGNOSIS — D61818 Other pancytopenia: Secondary | ICD-10-CM

## 2021-04-22 DIAGNOSIS — R29898 Other symptoms and signs involving the musculoskeletal system: Secondary | ICD-10-CM

## 2021-04-22 DIAGNOSIS — D519 Vitamin B12 deficiency anemia, unspecified: Secondary | ICD-10-CM | POA: Insufficient documentation

## 2021-04-22 MED ORDER — CYANOCOBALAMIN 1000 MCG/ML IJ SOLN
1000.0000 ug | Freq: Once | INTRAMUSCULAR | Status: AC
  Administered 2021-04-22: 1000 ug via INTRAMUSCULAR
  Filled 2021-04-22: qty 1

## 2021-05-20 ENCOUNTER — Inpatient Hospital Stay: Payer: Medicare Other | Attending: Oncology

## 2021-05-20 ENCOUNTER — Other Ambulatory Visit: Payer: Self-pay

## 2021-05-20 DIAGNOSIS — D61818 Other pancytopenia: Secondary | ICD-10-CM

## 2021-05-20 DIAGNOSIS — E538 Deficiency of other specified B group vitamins: Secondary | ICD-10-CM | POA: Diagnosis not present

## 2021-05-20 DIAGNOSIS — R29898 Other symptoms and signs involving the musculoskeletal system: Secondary | ICD-10-CM

## 2021-05-20 DIAGNOSIS — G63 Polyneuropathy in diseases classified elsewhere: Secondary | ICD-10-CM

## 2021-05-20 DIAGNOSIS — E539 Vitamin B deficiency, unspecified: Secondary | ICD-10-CM

## 2021-05-20 DIAGNOSIS — G8220 Paraplegia, unspecified: Secondary | ICD-10-CM

## 2021-05-20 MED ORDER — CYANOCOBALAMIN 1000 MCG/ML IJ SOLN
1000.0000 ug | Freq: Once | INTRAMUSCULAR | Status: AC
  Administered 2021-05-20: 1000 ug via INTRAMUSCULAR
  Filled 2021-05-20: qty 1

## 2021-05-20 NOTE — Patient Instructions (Signed)
Vitamin B12 Deficiency Vitamin B12 deficiency occurs when the body does not have enough of this important vitamin. The body needs this vitamin: To make red blood cells. To make DNA. This is the genetic material inside cells. To help the nerves work properly so they can carry messages from the brain to the body. Vitamin B12 deficiency can cause health problems, such as not having enough red blood cells in the blood (anemia). This can lead to nerve damage if untreated. What are the causes? This condition may be caused by: Not eating enough foods that contain vitamin B12. Not having enough stomach acid and digestive fluids to properly absorb vitamin B12 from the food that you eat. Having certain diseases that make it hard to absorb vitamin B12. These diseases include Crohn's disease, chronic pancreatitis, and cystic fibrosis. An autoimmune disorder in which the body does not make enough of a protein (intrinsic factor) within the stomach, resulting in not enough absorption of vitamin B12. Having a surgery in which part of the stomach or small intestine is removed. Taking certain medicines that make it hard for the body to absorb vitamin B12. These include: Heartburn medicines, such as antacids and proton pump inhibitors. Some medicines that are used to treat diabetes. What increases the risk? The following factors may make you more likely to develop a vitamin B12 deficiency: Being an older adult. Eating a vegetarian or vegan diet that does not include any foods that come from animals. Eating a poor diet while you are pregnant. Taking certain medicines. Having alcoholism. What are the signs or symptoms? In some cases, there are no symptoms of this condition. If the condition leads to anemia or nerve damage, various symptoms may occur, such as: Weakness. Tiredness (fatigue). Loss of appetite. Numbness or tingling in your hands and feet. Redness and burning of the tongue. Depression,  confusion, or memory problems. Trouble walking. If anemia is severe, symptoms can include: Shortness of breath. Dizziness. Rapid heart rate. How is this diagnosed? This condition may be diagnosed with a blood test to measure the level of vitamin B12 in your blood. You may also have other tests, including: A group of tests that measure certain characteristics of blood cells (complete blood count, CBC). A blood test to measure intrinsic factor. A procedure where a thin tube with a camera on the end is used to look into your stomach or intestines (endoscopy). Other tests may be needed to discover the cause of the deficiency. How is this treated? Treatment for this condition depends on the cause. This condition may be treated by: Changing your eating and drinking habits, such as: Eating more foods that contain vitamin B12. Drinking less alcohol or no alcohol. Getting vitamin B12 injections. Taking vitamin B12 supplements by mouth (orally). Your health care provider will tell you which dose is best for you. Follow these instructions at home: Eating and drinking  Include foods in your diet that come from animals and contain a lot of vitamin B12. These include: Meats and poultry. This includes beef, pork, chicken, turkey, and organ meats, such as liver. Seafood. This includes clams, rainbow trout, salmon, tuna, and haddock. Eggs. Dairy foods such as milk, yogurt, and cheese. Eat foods that have vitamin B12 added to them (are fortified), such as ready-to-eat breakfast cereals. Check the label on the package to see if a food is fortified. The items listed above may not be a complete list of foods and beverages you can eat and drink. Contact a dietitian for   more information. Alcohol use Do not drink alcohol if: Your health care provider tells you not to drink. You are pregnant, may be pregnant, or are planning to become pregnant. If you drink alcohol: Limit how much you have to: 0-1 drink a  day for women. 0-2 drinks a day for men. Know how much alcohol is in your drink. In the [place] one drink equals one 12 oz bottle of beer (355 mL), one 5 oz glass of wine (148 mL), or one 1 oz glass of hard liquor (44 mL). General instructions Get vitamin B12 injections if told to by your health care provider. Take supplements only as told by your health care provider. Follow the directions carefully. Keep all follow-up visits. This is important. Contact a health care provider if: Your symptoms come back. Your symptoms get worse or do not improve with treatment. Get help right away: You develop shortness of breath. You have a rapid heart rate. You have chest pain. You become dizzy or you faint. These symptoms may be an emergency. Get help right away. Call 911. Do not wait to see if the symptoms will go away. Do not drive yourself to the hospital. Summary Vitamin B12 deficiency occurs when the body does not have enough of this important vitamin. Common causes include not eating enough foods that contain vitamin B12, not being able to absorb vitamin B12 from the food that you eat, having a surgery in which part of the stomach or small intestine is removed, or taking certain medicines. Eat foods that have vitamin B12 in them. Treatment may include making a change in the way you eat and drink, getting vitamin B12 injections, or taking vitamin B12 supplements. This information is not intended to replace advice given to you by your health care provider. Make sure you discuss any questions you have with your health care provider. Document Revised: 09/10/2020 Document Reviewed: 09/10/2020 Elsevier Patient Education  2023 Elsevier Inc.  

## 2021-06-09 ENCOUNTER — Emergency Department (HOSPITAL_BASED_OUTPATIENT_CLINIC_OR_DEPARTMENT_OTHER)
Admission: EM | Admit: 2021-06-09 | Discharge: 2021-06-09 | Disposition: A | Discharge status: Home / Self Care | Payer: Medicare Other | Attending: Emergency Medicine | Admitting: Emergency Medicine

## 2021-06-09 ENCOUNTER — Encounter (HOSPITAL_BASED_OUTPATIENT_CLINIC_OR_DEPARTMENT_OTHER): Payer: Self-pay | Admitting: Obstetrics and Gynecology

## 2021-06-09 ENCOUNTER — Telehealth (HOSPITAL_BASED_OUTPATIENT_CLINIC_OR_DEPARTMENT_OTHER): Payer: Self-pay | Admitting: Emergency Medicine

## 2021-06-09 ENCOUNTER — Other Ambulatory Visit: Payer: Self-pay

## 2021-06-09 DIAGNOSIS — J4521 Mild intermittent asthma with (acute) exacerbation: Secondary | ICD-10-CM | POA: Insufficient documentation

## 2021-06-09 DIAGNOSIS — R0602 Shortness of breath: Secondary | ICD-10-CM | POA: Diagnosis not present

## 2021-06-09 DIAGNOSIS — R059 Cough, unspecified: Secondary | ICD-10-CM | POA: Diagnosis present

## 2021-06-09 DIAGNOSIS — Z91010 Allergy to peanuts: Secondary | ICD-10-CM | POA: Diagnosis not present

## 2021-06-09 DIAGNOSIS — Z79899 Other long term (current) drug therapy: Secondary | ICD-10-CM | POA: Insufficient documentation

## 2021-06-09 MED ORDER — IPRATROPIUM-ALBUTEROL 0.5-2.5 (3) MG/3ML IN SOLN
3.0000 mL | Freq: Once | RESPIRATORY_TRACT | Status: AC
  Administered 2021-06-09: 3 mL via RESPIRATORY_TRACT
  Filled 2021-06-09: qty 3

## 2021-06-09 MED ORDER — ALBUTEROL SULFATE (2.5 MG/3ML) 0.083% IN NEBU
2.5000 mg | INHALATION_SOLUTION | Freq: Once | RESPIRATORY_TRACT | Status: AC
  Administered 2021-06-09: 2.5 mg via RESPIRATORY_TRACT
  Filled 2021-06-09: qty 3

## 2021-06-09 MED ORDER — IPRATROPIUM-ALBUTEROL 0.5-2.5 (3) MG/3ML IN SOLN
3.0000 mL | RESPIRATORY_TRACT | Status: AC
  Administered 2021-06-09: 3 mL via RESPIRATORY_TRACT
  Filled 2021-06-09: qty 3

## 2021-06-09 MED ORDER — IPRATROPIUM-ALBUTEROL 0.5-2.5 (3) MG/3ML IN SOLN: 3.0000 mL | RESPIRATORY_TRACT | Status: DC

## 2021-06-09 MED ORDER — ALBUTEROL SULFATE (5 MG/ML) 0.5% IN NEBU: 2.5000 mg | INHALATION_SOLUTION | RESPIRATORY_TRACT | 12 refills | Status: DC | PRN

## 2021-06-09 MED ORDER — AEROCHAMBER PLUS FLO-VU MEDIUM MISC
1.0000 | Freq: Once | Status: AC
  Administered 2021-06-09: 1
  Filled 2021-06-09: qty 1

## 2021-06-09 MED ORDER — ALBUTEROL SULFATE HFA 108 (90 BASE) MCG/ACT IN AERS
2.0000 | INHALATION_SPRAY | Freq: Once | RESPIRATORY_TRACT | Status: AC
  Administered 2021-06-09: 2 via RESPIRATORY_TRACT
  Filled 2021-06-09: qty 6.7

## 2021-06-09 MED ORDER — PREDNISONE 50 MG PO TABS
60.0000 mg | ORAL_TABLET | Freq: Once | ORAL | Status: AC
  Administered 2021-06-09: 60 mg via ORAL
  Filled 2021-06-09: qty 1

## 2021-06-09 MED ORDER — PREDNISONE 10 MG PO TABS: 40.0000 mg | ORAL_TABLET | Freq: Every day | ORAL | 0 refills | Status: AC

## 2021-06-09 NOTE — ED Provider Notes (Signed)
?[place]PT ?Provider Note ? ? ?CSN: [id number] ?Arrival date & time: 06/09/21  [id number] ? ?  ? ?History ? ?Chief Complaint  ?Patient presents with  ? Asthma  ? ? ?CHRISTIANNA GUTT is a 36 y.o. female. ? ?HPI ? ?  ? ?36 year old female with a history of asthma, vitamin B12 anemia, subacute combined degeneration, iron deficiency, who presents with concern for shortness of breath. ? ?1 week ago developed cough, wheezing  ?Tried nebulizers, inhaler, would help a little bit but then would come back, using it every 15 minutes over the last few days. Out of nebulizer liquid now. ? ?No immobilization, travel, OCP, hx of blood clots ? ?No fever, chest pain, leg pain or swelling, no congestion ? ?No sick contacts,  ?Does have allergies ? ?Past Medical History:  ?Diagnosis Date  ? Adopted   ? Asthma   ? since baby  ? Bacterial vaginosis   ? Chlamydia   ? Depression   ? At age 30  ? MRSA infection   ? Obesity   ? Urinary tract infection   ?  ? ?Home Medications ?Prior to Admission medications   ?Medication Sig Start Date End Date Taking? Authorizing Provider  ?predniSONE (DELTASONE) 10 MG tablet Take 4 tablets (40 mg total) by mouth daily for 4 days. 06/09/21 06/13/21 Yes Gareth Morgan, MD  ?acetaminophen (TYLENOL 8 HOUR) 650 MG CR tablet Take 1 tablet (650 mg total) by mouth every 8 (eight) hours as needed. 10/05/19   Varney Biles, MD  ?albuterol (PROVENTIL HFA;VENTOLIN HFA) 108 (90 Base) MCG/ACT inhaler Inhale 2 puffs into the lungs every 6 (six) hours as needed for wheezing or shortness of breath. For asthma 01/20/16   Magrinat, Virgie Dad, MD  ?albuterol (PROVENTIL) (5 MG/ML) 0.5% nebulizer solution Take 0.5 mLs (2.5 mg total) by nebulization every 4 (four) hours as needed for wheezing or shortness of breath. 06/09/21   Gareth Morgan, MD  ?cyanocobalamin (,VITAMIN B-12,) 1000 MCG/ML injection Inject 1 mL (1,000 mcg total) into the muscle every 30 (thirty) days. 12/19/13   Theodis Blaze, MD   ?hydrOXYzine (ATARAX/VISTARIL) 25 MG tablet Take 1-2 tablets (25-50 mg total) by mouth every 6 (six) hours as needed for anxiety. 06/26/20   Gareth Morgan, MD  ?loratadine (CLARITIN) 10 MG tablet Take 1 tablet (10 mg total) by mouth daily for 14 days. 02/26/21 [id number]  Lianne Cure, DO  ?   ? ?Allergies    ?Peanut-containing drug products, Shellfish allergy, and Aleve [naproxen]   ? ?Review of Systems   ?Review of Systems ? ?Physical Exam ?Updated Vital Signs ?BP (!) 134/98   Pulse 78   Temp 98.5 ?F (36.9 ?C) (Oral)   Resp 18   Ht 5\' 2"  (1.575 m)   Wt 96.8 kg   LMP 06/01/2021 (Approximate)   SpO2 100%   BMI 39.03 kg/m?  ?Physical Exam ?Vitals and nursing note reviewed.  ?Constitutional:   ?   General: She is not in acute distress. ?   Appearance: She is well-developed. She is not diaphoretic.  ?HENT:  ?   Head: Normocephalic and atraumatic.  ?Eyes:  ?   Conjunctiva/sclera: Conjunctivae normal.  ?Cardiovascular:  ?   Rate and Rhythm: Normal rate and regular rhythm.  ?   Heart sounds: Normal heart sounds. No murmur heard. ?  No friction rub. No gallop.  ?Pulmonary:  ?   Effort: Pulmonary effort is normal. No respiratory distress.  ?   Breath sounds: Wheezing present.  No rales.  ?Abdominal:  ?   General: There is no distension.  ?   Palpations: Abdomen is soft.  ?   Tenderness: There is no abdominal tenderness. There is no guarding.  ?Musculoskeletal:     ?   General: No tenderness.  ?   Cervical back: Normal range of motion.  ?Skin: ?   General: Skin is warm and dry.  ?   Findings: No erythema or rash.  ?Neurological:  ?   Mental Status: She is alert and oriented to person, place, and time.  ? ? ?ED Results / Procedures / Treatments   ?Labs ?(all labs ordered are listed, but only abnormal results are displayed) ?Labs Reviewed - No data to display ? ?EKG ?EKG Interpretation ? ?Date/Time:  Thursday Jun 09 2021 07:19:36 EDT ?Ventricular Rate:  77 ?PR Interval:  159 ?QRS Duration: 86 ?QT Interval:  422 ?QTC  Calculation: 478 ?R Axis:   70 ?Text Interpretation: Sinus rhythm Consider left ventricular hypertrophy No significant change since last tracing Confirmed by Gareth Morgan [phone]) on 06/09/2021 8:07:10 AM ? ?Radiology ?No results found. ? ?Procedures ?Procedures  ? ? ?Medications Ordered in ED ?Medications  ?albuterol (PROVENTIL) (2.5 MG/3ML) 0.083% nebulizer solution 2.5 mg (2.5 mg Nebulization Given 06/09/21 0722)  ?ipratropium-albuterol (DUONEB) 0.5-2.5 (3) MG/3ML nebulizer solution 3 mL (3 mLs Nebulization Given 06/09/21 0722)  ?predniSONE (DELTASONE) tablet 60 mg (60 mg Oral Given 06/09/21 0746)  ?albuterol (VENTOLIN HFA) 108 (90 Base) MCG/ACT inhaler 2 puff (2 puffs Inhalation Given 06/09/21 0752)  ?ipratropium-albuterol (DUONEB) 0.5-2.5 (3) MG/3ML nebulizer solution 3 mL (3 mLs Nebulization Given 06/09/21 0752)  ?AeroChamber Plus Flo-Vu Medium MISC 1 each (1 each Other Given 06/09/21 0752)  ? ? ?ED Course/ Medical Decision Making/ A&P ?  ?                        ?Medical Decision Making ?Amount and/or Complexity of Data Reviewed ?External Data Reviewed: labs and notes. ?ECG/medicine tests: ordered and independent interpretation performed. Decision-making details documented in ED Course. ? ?Risk ?Prescription drug management. ? ? ?36yo female with history of asthma, polyneuropathy due to vitamin B12 deficiency, who presents with concern for shortness of breath.  ? ?Differential diagnosis for dyspnea includes ACS, PE, COPD exacerbation, CHF exacerbation, anemia, pneumonia, viral etiology such as COVID 19 infection, metabolic abnormality.   EKG was evaluated by me which showed no acute changes. ? ?History and exam most consistent with asthma exacerbation.  Labs in February with hgb 10.3, no black or bloody stools, low suspicion at this time for clinically significant anemia. No vomiting or diarrhea and doubt significant electrolyte abnormaliteis.  No CP, doubt ACS, hx and exam not consistent with PE or CHF. ? ?Given  nebulizers and prednisone with improvement. Given rx for prednisone, albuterol. Patient discharged in stable condition with understanding of reasons to return.  ? ? ? ? ? ? ? ? ?Final Clinical Impression(s) / ED Diagnoses ?Final diagnoses:  ?Mild intermittent asthma with exacerbation  ? ? ?Rx / DC Orders ?ED Discharge Orders   ? ?      Ordered  ?  predniSONE (DELTASONE) 10 MG tablet  Daily       ? 06/09/21 0808  ?  albuterol (PROVENTIL) (5 MG/ML) 0.5% nebulizer solution  Every 4 hours PRN       ? 06/09/21 [id number]  ? ?  ?  ? ?  ? ? ?  ?Gareth Morgan, MD ?06/10/21 2140 ? ?

## 2021-06-09 NOTE — ED Notes (Signed)
Patient educated on the use of MDI with and without spacer. Patient demonstrated good effort and technique. Patient acknowledged understanding.  ?

## 2021-06-09 NOTE — ED Triage Notes (Signed)
Patient repots to the ER for asthma that has been getting progressively worse over the past week. Patient reports she has been doing her albuterol but she is not on a maintenance inhaler.  ?

## 2021-06-17 ENCOUNTER — Encounter: Payer: Self-pay | Admitting: Hematology and Oncology

## 2021-06-17 ENCOUNTER — Inpatient Hospital Stay: Payer: Medicare Other | Attending: Oncology

## 2021-06-17 ENCOUNTER — Inpatient Hospital Stay (HOSPITAL_BASED_OUTPATIENT_CLINIC_OR_DEPARTMENT_OTHER): Payer: Medicare Other | Admitting: Hematology and Oncology

## 2021-06-17 ENCOUNTER — Other Ambulatory Visit: Payer: Self-pay | Admitting: *Deleted

## 2021-06-17 ENCOUNTER — Inpatient Hospital Stay: Payer: Medicare Other

## 2021-06-17 ENCOUNTER — Other Ambulatory Visit: Payer: Self-pay

## 2021-06-17 VITALS — BP 137/98 | HR 79 | Temp 98.1°F | Resp 16 | Ht 62.0 in | Wt 213.2 lb

## 2021-06-17 DIAGNOSIS — E538 Deficiency of other specified B group vitamins: Secondary | ICD-10-CM | POA: Diagnosis not present

## 2021-06-17 DIAGNOSIS — G63 Polyneuropathy in diseases classified elsewhere: Secondary | ICD-10-CM

## 2021-06-17 DIAGNOSIS — F17210 Nicotine dependence, cigarettes, uncomplicated: Secondary | ICD-10-CM | POA: Diagnosis not present

## 2021-06-17 DIAGNOSIS — G8220 Paraplegia, unspecified: Secondary | ICD-10-CM

## 2021-06-17 DIAGNOSIS — D508 Other iron deficiency anemias: Secondary | ICD-10-CM

## 2021-06-17 DIAGNOSIS — E539 Vitamin B deficiency, unspecified: Secondary | ICD-10-CM

## 2021-06-17 DIAGNOSIS — D509 Iron deficiency anemia, unspecified: Secondary | ICD-10-CM | POA: Insufficient documentation

## 2021-06-17 DIAGNOSIS — D61818 Other pancytopenia: Secondary | ICD-10-CM

## 2021-06-17 DIAGNOSIS — R29898 Other symptoms and signs involving the musculoskeletal system: Secondary | ICD-10-CM

## 2021-06-17 LAB — CBC WITH DIFFERENTIAL/PLATELET
Abs Immature Granulocytes: 0.02 10*3/uL (ref 0.00–0.07)
Basophils Absolute: 0 10*3/uL (ref 0.0–0.1)
Basophils Relative: 0 %
Eosinophils Absolute: 0.2 10*3/uL (ref 0.0–0.5)
Eosinophils Relative: 2 %
HCT: 32.3 % — ABNORMAL LOW (ref 36.0–46.0)
Hemoglobin: 10.2 g/dL — ABNORMAL LOW (ref 12.0–15.0)
Immature Granulocytes: 0 %
Lymphocytes Relative: 25 %
Lymphs Abs: 2.2 10*3/uL (ref 0.7–4.0)
MCH: 24.3 pg — ABNORMAL LOW (ref 26.0–34.0)
MCHC: 31.6 g/dL (ref 30.0–36.0)
MCV: 76.9 fL — ABNORMAL LOW (ref 80.0–100.0)
Monocytes Absolute: 0.7 10*3/uL (ref 0.1–1.0)
Monocytes Relative: 8 %
Neutro Abs: 5.7 10*3/uL (ref 1.7–7.7)
Neutrophils Relative %: 65 %
Platelets: 313 10*3/uL (ref 150–400)
RBC: 4.2 MIL/uL (ref 3.87–5.11)
RDW: 17.1 % — ABNORMAL HIGH (ref 11.5–15.5)
WBC: 8.7 10*3/uL (ref 4.0–10.5)
nRBC: 0 % (ref 0.0–0.2)

## 2021-06-17 LAB — COMPREHENSIVE METABOLIC PANEL
ALT: 10 U/L (ref 0–44)
AST: 9 U/L — ABNORMAL LOW (ref 15–41)
Albumin: 3.7 g/dL (ref 3.5–5.0)
Alkaline Phosphatase: 50 U/L (ref 38–126)
Anion gap: 7 (ref 5–15)
BUN: 10 mg/dL (ref 6–20)
CO2: 26 mmol/L (ref 22–32)
Calcium: 8.8 mg/dL — ABNORMAL LOW (ref 8.9–10.3)
Chloride: 107 mmol/L (ref 98–111)
Creatinine, Ser: 0.85 mg/dL (ref 0.44–1.00)
GFR, Estimated: >60 mL/min (ref >60)
Glucose, Bld: 96 mg/dL (ref 70–99)
Potassium: 3.4 mmol/L — ABNORMAL LOW (ref 3.5–5.1)
Sodium: 140 mmol/L (ref 135–145)
Total Bilirubin: 0.3 mg/dL (ref 0.3–1.2)
Total Protein: 7.5 g/dL (ref 6.5–8.1)

## 2021-06-17 LAB — IRON AND IRON BINDING CAPACITY (CC-WL,HP ONLY)
Iron: 21 ug/dL — ABNORMAL LOW (ref 28–170)
Saturation Ratios: 5 % — ABNORMAL LOW (ref 10.4–31.8)
TIBC: 414 ug/dL (ref 250–450)
UIBC: 393 ug/dL (ref 148–442)

## 2021-06-17 LAB — FERRITIN: Ferritin: 7 ng/mL — ABNORMAL LOW (ref 11–307)

## 2021-06-17 LAB — VITAMIN B12: Vitamin B-12: 175 pg/mL — ABNORMAL LOW (ref 180–914)

## 2021-06-17 MED ORDER — CYANOCOBALAMIN 1000 MCG/ML IJ SOLN
1000.0000 ug | Freq: Once | INTRAMUSCULAR | Status: AC
  Administered 2021-06-17: 1000 ug via INTRAMUSCULAR
  Filled 2021-06-17: qty 1

## 2021-06-17 NOTE — Progress Notes (Signed)
[facility]  Telephone:(336) [phone] Fax:(336) [phone]    ID: Jill Summers OB: 1985-12-31  MR#: [id number]  HQI#:[id number]#:[id number]  Patient Care Team: [facility] - General Magrinat, Valentino HueGustav C, MD (Inactive) as Consulting Physician (Oncology) OTHER MD:  CHIEF COMPLAINT: vit B12 anemia, subacute combined degeneration, iron deficiency  CURRENT THERAPY:  vit B12 supplementation, Feraheme as needed   INTERVAL HISTORY:  Jill Summers returns today for follow-up and treatment of her vitamin B12 anemia, subacute combined degeneration, and iron deficiency.  She continues to have regular menstrual cycles.  She does not describe them as heavy. She has been getting her B12 injection every 4 weeks.  No recent oral or IV iron supplementation. Rest of the pertinent 10 point ROS reviewed and negative.  REVIEW OF SYSTEMS:  A detailed review of systems today was otherwise stable   HISTORY OF PRESENT ILLNESS: From the earlier summary:  Jill Summers is a right handed female with history of depression and bronchial asthma as well as recent cellulitis of the umbilicus and treated with Bactrim in September 2015 as well as recent MRSA PCR screen +2 months ago as well as previously untreated B-12 deficiency.  Patient lives with children independent prior to admission and her mother has been assisting with care of the children recently. Admitted 12/16/2013 with bouts of dizziness over the past 2 weeks as well as lower strandy weakness and numbness. She denied any falls. No change in bowel or bladder. Cranial CT scan negative. MRI of thoracic lumbar and cervical spine showed multiple bilateral thoracic cord lesions in the lateral white matter most compatible with demyelinating disease of question etiology and workup ongoing as per neurology services  and felt cord lesions most likely manifestations of B-12 deficiency. Partial visualization of lower cervical cord shows white matter  lesions with full cervical spine films pending. MRI of the brain 12/18/2013 showed less than 10 nonspecific subcentimeter supratentorial white matter T2 hyperintensities in a atypical distribution for classic demyelination. Found to have severe pancytopenia with WBC 1.4, ANC 0.4, hemoglobin 5.5, MCV 101.9, platelets 37,000. Hematology service consulted (Dr. Darnelle CatalanMagrinat) She was transfused. Etiology is secondary to B-12 deficiency and currently maintained on supplementation with plan to follow-up with hematology services mostly for B-12 injections    PAST MEDICAL HISTORY: Past Medical History:  Diagnosis Date   Adopted    Asthma    since baby   Bacterial vaginosis    Chlamydia    Depression    At age 36   MRSA infection    Obesity    Urinary tract infection     PAST SURGICAL HISTORY: Past Surgical History:  Procedure Laterality Date   CESAREAN SECTION     CESAREAN SECTION WITH BILATERAL TUBAL LIGATION      FAMILY HISTORY Family History  Adopted: Yes  Mother - hypertension, Father - diabetes   GYNECOLOGIC HISTORY:  Patient has regular menstrual periods. She is status post bilateral tubal ligation. 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  Formerly she cleaned houses.  She lives with 509 y/o son Sheliah HatchCamden Iran Ouch(Birthday 01/2010). Mother Jill Summers lives next door--she is a retired [employer] SW-- with patient's 37 y/o child, Jill Summers.  ADVANCED DIRECTIVES: not in place   HEALTH MAINTENANCE: Social History   Tobacco Use   Smoking status: Some Days    Types: Cigars  Passive exposure: Never   Smokeless tobacco: Never  Vaping Use   Vaping Use: Never used  Substance Use Topics   Alcohol use: No    Alcohol/week: 2.0 standard drinks    Types: 2 Standard drinks or equivalent per week   Drug use: Yes    Types: Marijuana    Comment: Rolled in tobacco    Allergies  Allergen  Reactions   Peanut-Containing Drug Products Anaphylaxis, Hives and Swelling    *Throat swelling*   Shellfish Allergy Anaphylaxis, Hives and Swelling    Throat swelling   Aleve [Naproxen]     Current Outpatient Medications  Medication Sig Dispense Refill   acetaminophen (TYLENOL 8 HOUR) 650 MG CR tablet Take 1 tablet (650 mg total) by mouth every 8 (eight) hours as needed. 30 tablet 0   albuterol (PROVENTIL HFA;VENTOLIN HFA) 108 (90 Base) MCG/ACT inhaler Inhale 2 puffs into the lungs every 6 (six) hours as needed for wheezing or shortness of breath. For asthma 1 Inhaler 12   albuterol (PROVENTIL) (5 MG/ML) 0.5% nebulizer solution Take 0.5 mLs (2.5 mg total) by nebulization every 4 (four) hours as needed for wheezing or shortness of breath. 20 mL 12   cyanocobalamin (,VITAMIN B-12,) 1000 MCG/ML injection Inject 1 mL (1,000 mcg total) into the muscle every 30 (thirty) days. 1 mL 1   hydrOXYzine (ATARAX/VISTARIL) 25 MG tablet Take 1-2 tablets (25-50 mg total) by mouth every 6 (six) hours as needed for anxiety. 12 tablet 0   loratadine (CLARITIN) 10 MG tablet Take 1 tablet (10 mg total) by mouth daily for 14 days. 14 tablet 0   No current facility-administered medications for this visit.    OBJECTIVE: African-American woman in no acute distress  Vitals:   06/17/21 0840  BP: (!) 137/98  Pulse: 79  Resp: 16  Temp: 98.1 F (36.7 C)  SpO2: 100%    Wt Readings from Last 3 Encounters:  06/17/21 213 lb 3.2 oz (96.7 kg)  06/09/21 213 lb 6.5 oz (96.8 kg)  03/25/21 213 lb 6.4 oz (96.8 kg)   Body mass index is 38.99 kg/m.    ECOG FS:1 - Symptomatic but completely ambulatory  Physical Exam Constitutional:      Appearance: Normal appearance.  HENT:     Head: Normocephalic and atraumatic.  Cardiovascular:     Rate and Rhythm: Normal rate and regular rhythm.  Pulmonary:     Breath sounds: Wheezing present.  Musculoskeletal:        General: Normal range of motion.  Skin:    General:  Skin is warm and dry.  Neurological:     General: No focal deficit present.     Mental Status: She is alert.     Sensory: No sensory deficit.     Motor: No weakness.     Coordination: Coordination normal.     Gait: Gait normal.     Comments: Proprioception is normal in lower extremities    LAB RESULTS:  CBC    Component Value Date/Time   WBC 8.7 06/17/2021 0831   RBC 4.20 06/17/2021 0831   HGB 10.2 (L) 06/17/2021 0831   HGB 12.8 01/29/2018 0831   HGB 12.3 01/20/2016 1404   HCT 32.3 (L) 06/17/2021 0831   HCT 37.1 01/20/2016 1404   PLT 313 06/17/2021 0831   PLT 204 01/29/2018 0831   PLT 249 01/20/2016 1404   MCV 76.9 (L) 06/17/2021 0831   MCV 85.5 01/20/2016 1404   MCH 24.3 (L) 06/17/2021 0831  MCHC 31.6 06/17/2021 0831   RDW 17.1 (H) 06/17/2021 0831   RDW 14.1 01/20/2016 1404   LYMPHSABS 2.2 06/17/2021 0831   LYMPHSABS 2.8 01/20/2016 1404   MONOABS 0.7 06/17/2021 0831   MONOABS 0.2 01/20/2016 1404   EOSABS 0.2 06/17/2021 0831   EOSABS 0.1 01/20/2016 1404   BASOSABS 0.0 06/17/2021 0831   BASOSABS 0.0 01/20/2016 1404   CMP     Component Value Date/Time   NA 140 03/25/2021 0944   NA 140 02/04/2015 1049   K 3.9 03/25/2021 0944   K 3.9 02/04/2015 1049   CL 108 03/25/2021 0944   CO2 27 03/25/2021 0944   CO2 23 02/04/2015 1049   GLUCOSE 98 03/25/2021 0944   GLUCOSE 98 02/04/2015 1049   BUN 10 03/25/2021 0944   BUN 9.8 02/04/2015 1049   CREATININE 0.85 03/25/2021 0944   CREATININE 0.85 01/29/2018 0831   CREATININE 0.9 02/04/2015 1049   CALCIUM 8.9 03/25/2021 0944   CALCIUM 8.8 02/04/2015 1049   PROT 6.9 03/25/2021 0944   PROT 6.9 02/04/2015 1049   ALBUMIN 3.7 03/25/2021 0944   ALBUMIN 3.4 (L) 02/04/2015 1049   AST 12 (L) 03/25/2021 0944   AST 12 (L) 01/29/2018 0831   AST 13 02/04/2015 1049   ALT 10 03/25/2021 0944   ALT 9 01/29/2018 0831   ALT <9 02/04/2015 1049   ALKPHOS 43 03/25/2021 0944   ALKPHOS 53 02/04/2015 1049   BILITOT 0.4 03/25/2021 0944    BILITOT 0.6 01/29/2018 0831   BILITOT 0.47 02/04/2015 1049   GFRNONAA >60 03/25/2021 0944   GFRNONAA >60 01/29/2018 0831   GFRAA >60 11/05/2018 1036   GFRAA >60 01/29/2018 0831        Component Value Date/Time   COLORURINE AMBER (A) 12/16/2013 0707   APPEARANCEUR HAZY (A) 12/16/2013 0707   LABSPEC 1.025 12/16/2013 0707   PHURINE 5.0 12/16/2013 0707   GLUCOSEU NEGATIVE 12/16/2013 0707   HGBUR LARGE (A) 12/16/2013 0707   BILIRUBINUR SMALL (A) 12/16/2013 0707   KETONESUR NEGATIVE 12/16/2013 0707   PROTEINUR NEGATIVE 12/16/2013 0707   UROBILINOGEN >8.0 (H) 12/16/2013 0707   NITRITE NEGATIVE 12/16/2013 0707   LEUKOCYTESUR SMALL (A) 12/16/2013 0707   Iron/TIBC/Ferritin/ %Sat    Component Value Date/Time   IRON 22 (L) 03/25/2021 0944   TIBC 426 03/25/2021 0944   FERRITIN <4 (L) 03/25/2021 0944   FERRITIN 9 12/09/2015 1037   IRONPCTSAT 5 (L) 03/25/2021 0944    STUDIES: No results found.   ASSESSMENT: 36 y.o. [place] woman presenting 11/15/2013 with inability to walk, fever and pancytopenia, found to be severely B-12 deficient  (1) B-12 deficiency: will need lifelong replacement; as patient does not have PCP this can be done through our office             (a) received montlhy  (2) subacute combined degeneration - Stable  (3) iron deficiency: Last ferritin level in February 2023 of 4  PLAN:   [name] is here for follow-up.   Since last visit, she has ongoing asthma exacerbation, no worsening neuropathy or subacute combined degeneration symptoms.  She occasionally still stumbles and has some balance issues.  No new health issues. Physical examination today unremarkable. Given last ferritin of 4, I have recommended that she proceed with Injectafer.  She will have the scheduled on her way out today. She will continue B12 injection every 4 weeks. Return to clinic in 3 months with repeat labs.  She was encouraged to call her PCP  about her ongoing asthma exacerbation if  she does not see relief by the time she completes her steroid course. She expressed understanding.  Total time spent: 30 minutes   *Total Encounter Time as defined by the Centers for Medicare and Medicaid Services includes, in addition to the face-to-face time of a patient visit (documented in the note above) non-face-to-face time: obtaining and reviewing outside history, ordering and reviewing medications, tests or procedures, care coordination (communications with other health care professionals or caregivers) and documentation in the medical record.  Rachel Moulds MD

## 2021-06-20 ENCOUNTER — Telehealth: Payer: Self-pay | Admitting: Hematology and Oncology

## 2021-06-20 NOTE — Telephone Encounter (Signed)
Scheduled appointment per 05/19 los. Patient aware.  

## 2021-06-23 ENCOUNTER — Inpatient Hospital Stay: Payer: Medicare Other

## 2021-06-23 ENCOUNTER — Other Ambulatory Visit: Payer: Self-pay

## 2021-06-23 VITALS — BP 130/84 | HR 67 | Temp 97.7°F | Resp 18 | Wt 214.0 lb

## 2021-06-23 DIAGNOSIS — D509 Iron deficiency anemia, unspecified: Secondary | ICD-10-CM | POA: Diagnosis not present

## 2021-06-23 DIAGNOSIS — E538 Deficiency of other specified B group vitamins: Secondary | ICD-10-CM

## 2021-06-23 DIAGNOSIS — F17210 Nicotine dependence, cigarettes, uncomplicated: Secondary | ICD-10-CM | POA: Diagnosis not present

## 2021-06-23 DIAGNOSIS — G8220 Paraplegia, unspecified: Secondary | ICD-10-CM

## 2021-06-23 DIAGNOSIS — D61818 Other pancytopenia: Secondary | ICD-10-CM

## 2021-06-23 DIAGNOSIS — E539 Vitamin B deficiency, unspecified: Secondary | ICD-10-CM

## 2021-06-23 DIAGNOSIS — G63 Polyneuropathy in diseases classified elsewhere: Secondary | ICD-10-CM

## 2021-06-23 MED ORDER — ACETAMINOPHEN 325 MG PO TABS
650.0000 mg | ORAL_TABLET | Freq: Once | ORAL | Status: AC
  Administered 2021-06-23: 650 mg via ORAL
  Filled 2021-06-23: qty 2

## 2021-06-23 MED ORDER — SODIUM CHLORIDE 0.9 % IV SOLN
750.0000 mg | Freq: Once | INTRAVENOUS | Status: AC
  Administered 2021-06-23: 750 mg via INTRAVENOUS
  Filled 2021-06-23: qty 15

## 2021-06-23 MED ORDER — FAMOTIDINE IN NACL 20-0.9 MG/50ML-% IV SOLN
20.0000 mg | Freq: Once | INTRAVENOUS | Status: AC
  Administered 2021-06-23: 20 mg via INTRAVENOUS
  Filled 2021-06-23: qty 50

## 2021-06-23 MED ORDER — SODIUM CHLORIDE 0.9 % IV SOLN: Freq: Once | INTRAVENOUS | Status: AC

## 2021-06-23 NOTE — Patient Instructions (Signed)
Ferric Carboxymaltose Injection What is this medication? FERRIC CARBOXYMALTOSE (FER ik kar BOX ee MAWL tose) treats low levels of iron in your body (iron deficiency anemia). Iron is a mineral that plays an important role in making red blood cells, which carry oxygen from your lungs to the rest of your body. This medicine may be used for other purposes; ask your health care provider or pharmacist if you have questions. COMMON BRAND NAME(S): Injectafer What should I tell my care team before I take this medication? They need to know if you have any of these conditions: High blood pressure High levels of iron in the blood An unusual or allergic reaction to iron, other medications, foods, dyes, or preservatives Pregnant or trying to get pregnant Breast-feeding How should I use this medication? This medication is injected into a vein. It is given by your care team in a hospital or clinic setting. Talk to your care team about the use of this medication in children. While it may be given to children as young as 1 year for selected conditions, precautions do apply. Overdosage: If you think you have taken too much of this medicine contact a poison control center or emergency room at once. NOTE: This medicine is only for you. Do not share this medicine with others. What if I miss a dose? Keep appointments for follow-up doses. It is important not to miss your dose. Call your care team if you are unable to keep an appointment. What may interact with this medication? Do not take this medication with any of the following medications: Deferoxamine Dimercaprol Other iron products This list may not describe all possible interactions. Give your health care provider a list of all the medicines, herbs, non-prescription drugs, or dietary supplements you use. Also tell them if you smoke, drink alcohol, or use illegal drugs. Some items may interact with your medicine. What should I watch for while using this  medication? Visit your care team for regular checks on your progress. Tell your care team if your symptoms do not start to get better or if they get worse. You may need blood work while you are taking this medication. You may need to eat more foods that contain iron. Talk to your care team. Foods that contain iron include whole grains/cereals, dried fruits, beans, or peas, leafy green vegetables, and organ meats (liver, kidney). What side effects may I notice from receiving this medication? Side effects that you should report to your care team as soon as possible: Allergic reactions--skin rash, itching, hives, swelling of the face, lips, tongue, or throat Increase in blood pressure Low blood pressure--dizziness, feeling faint or lightheaded, blurry vision Shortness of breath Side effects that usually do not require medical attention (report to your care team if they continue or are bothersome): Flushing Headache Joint pain Muscle pain Nausea Pain, redness, or irritation at injection site This list may not describe all possible side effects. Call your doctor for medical advice about side effects. You may report side effects to FDA at 1-800-FDA-1088. Where should I keep my medication? This medication is given in a hospital or clinic. It will not be stored at home. NOTE: This sheet is a summary. It may not cover all possible information. If you have questions about this medicine, talk to your doctor, pharmacist, or health care provider.  2023 Elsevier/Gold Standard (2020-06-11 00:00:00)  

## 2021-06-23 NOTE — Progress Notes (Signed)
Patient declined to stay full 30 minute observation post iron infusion. Patient tolerated iron infusion VSS at discharge. Patient offered nutrition. Ambulatory at discharge.

## 2021-06-24 ENCOUNTER — Ambulatory Visit: Payer: Medicare Other

## 2021-06-24 ENCOUNTER — Other Ambulatory Visit: Payer: Medicare Other

## 2021-06-24 ENCOUNTER — Ambulatory Visit: Payer: Medicare Other | Admitting: Hematology and Oncology

## 2021-07-01 ENCOUNTER — Other Ambulatory Visit: Payer: Self-pay

## 2021-07-01 ENCOUNTER — Inpatient Hospital Stay: Payer: Medicare Other | Attending: Oncology

## 2021-07-01 VITALS — BP 133/81 | HR 76 | Temp 98.3°F | Resp 17

## 2021-07-01 DIAGNOSIS — D509 Iron deficiency anemia, unspecified: Secondary | ICD-10-CM

## 2021-07-01 DIAGNOSIS — D61818 Other pancytopenia: Secondary | ICD-10-CM

## 2021-07-01 DIAGNOSIS — G8220 Paraplegia, unspecified: Secondary | ICD-10-CM

## 2021-07-01 DIAGNOSIS — E539 Vitamin B deficiency, unspecified: Secondary | ICD-10-CM

## 2021-07-01 DIAGNOSIS — E538 Deficiency of other specified B group vitamins: Secondary | ICD-10-CM

## 2021-07-01 DIAGNOSIS — G63 Polyneuropathy in diseases classified elsewhere: Secondary | ICD-10-CM

## 2021-07-01 MED ORDER — FAMOTIDINE IN NACL 20-0.9 MG/50ML-% IV SOLN
20.0000 mg | Freq: Once | INTRAVENOUS | Status: AC
  Administered 2021-07-01: 20 mg via INTRAVENOUS
  Filled 2021-07-01: qty 50

## 2021-07-01 MED ORDER — SODIUM CHLORIDE 0.9 % IV SOLN
750.0000 mg | Freq: Once | INTRAVENOUS | Status: AC
  Administered 2021-07-01: 750 mg via INTRAVENOUS
  Filled 2021-07-01: qty 15

## 2021-07-01 MED ORDER — SODIUM CHLORIDE 0.9 % IV SOLN: Freq: Once | INTRAVENOUS | Status: AC

## 2021-07-01 MED ORDER — ACETAMINOPHEN 325 MG PO TABS
650.0000 mg | ORAL_TABLET | Freq: Once | ORAL | Status: AC
  Administered 2021-07-01: 650 mg via ORAL
  Filled 2021-07-01: qty 2

## 2021-07-01 NOTE — Patient Instructions (Signed)
Ferric Carboxymaltose Injection What is this medication? FERRIC CARBOXYMALTOSE (FER ik kar BOX ee MAWL tose) treats low levels of iron in your body (iron deficiency anemia). Iron is a mineral that plays an important role in making red blood cells, which carry oxygen from your lungs to the rest of your body. This medicine may be used for other purposes; ask your health care provider or pharmacist if you have questions. COMMON BRAND NAME(S): Injectafer What should I tell my care team before I take this medication? They need to know if you have any of these conditions: High blood pressure High levels of iron in the blood An unusual or allergic reaction to iron, other medications, foods, dyes, or preservatives Pregnant or trying to get pregnant Breast-feeding How should I use this medication? This medication is injected into a vein. It is given by your care team in a hospital or clinic setting. Talk to your care team about the use of this medication in children. While it may be given to children as young as 1 year for selected conditions, precautions do apply. Overdosage: If you think you have taken too much of this medicine contact a poison control center or emergency room at once. NOTE: This medicine is only for you. Do not share this medicine with others. What if I miss a dose? Keep appointments for follow-up doses. It is important not to miss your dose. Call your care team if you are unable to keep an appointment. What may interact with this medication? Do not take this medication with any of the following medications: Deferoxamine Dimercaprol Other iron products This list may not describe all possible interactions. Give your health care provider a list of all the medicines, herbs, non-prescription drugs, or dietary supplements you use. Also tell them if you smoke, drink alcohol, or use illegal drugs. Some items may interact with your medicine. What should I watch for while using this  medication? Visit your care team for regular checks on your progress. Tell your care team if your symptoms do not start to get better or if they get worse. You may need blood work while you are taking this medication. You may need to eat more foods that contain iron. Talk to your care team. Foods that contain iron include whole grains/cereals, dried fruits, beans, or peas, leafy green vegetables, and organ meats (liver, kidney). What side effects may I notice from receiving this medication? Side effects that you should report to your care team as soon as possible: Allergic reactions--skin rash, itching, hives, swelling of the face, lips, tongue, or throat Increase in blood pressure Low blood pressure--dizziness, feeling faint or lightheaded, blurry vision Shortness of breath Side effects that usually do not require medical attention (report to your care team if they continue or are bothersome): Flushing Headache Joint pain Muscle pain Nausea Pain, redness, or irritation at injection site This list may not describe all possible side effects. Call your doctor for medical advice about side effects. You may report side effects to FDA at 1-800-FDA-1088. Where should I keep my medication? This medication is given in a hospital or clinic. It will not be stored at home. NOTE: This sheet is a summary. It may not cover all possible information. If you have questions about this medicine, talk to your doctor, pharmacist, or health care provider.  2023 Elsevier/Gold Standard (2020-06-11 00:00:00)  

## 2021-07-01 NOTE — Progress Notes (Signed)
Pt declined to stay for post 30 obs, ambulatory to lobby, VSS. 

## 2021-07-14 ENCOUNTER — Encounter: Payer: Self-pay | Admitting: Oncology

## 2021-07-14 ENCOUNTER — Other Ambulatory Visit (HOSPITAL_BASED_OUTPATIENT_CLINIC_OR_DEPARTMENT_OTHER): Payer: Self-pay

## 2021-07-14 ENCOUNTER — Encounter (HOSPITAL_BASED_OUTPATIENT_CLINIC_OR_DEPARTMENT_OTHER): Payer: Self-pay | Admitting: Nurse Practitioner

## 2021-07-14 ENCOUNTER — Ambulatory Visit (INDEPENDENT_AMBULATORY_CARE_PROVIDER_SITE_OTHER): Payer: Medicare Other | Admitting: Nurse Practitioner

## 2021-07-14 VITALS — BP 132/96 | HR 78 | Ht 63.0 in | Wt 216.6 lb

## 2021-07-14 DIAGNOSIS — G63 Polyneuropathy in diseases classified elsewhere: Secondary | ICD-10-CM

## 2021-07-14 DIAGNOSIS — L509 Urticaria, unspecified: Secondary | ICD-10-CM

## 2021-07-14 DIAGNOSIS — D508 Other iron deficiency anemias: Secondary | ICD-10-CM

## 2021-07-14 DIAGNOSIS — K5901 Slow transit constipation: Secondary | ICD-10-CM | POA: Diagnosis not present

## 2021-07-14 DIAGNOSIS — D61818 Other pancytopenia: Secondary | ICD-10-CM

## 2021-07-14 DIAGNOSIS — Z8669 Personal history of other diseases of the nervous system and sense organs: Secondary | ICD-10-CM

## 2021-07-14 DIAGNOSIS — E538 Deficiency of other specified B group vitamins: Secondary | ICD-10-CM | POA: Diagnosis not present

## 2021-07-14 DIAGNOSIS — F3340 Major depressive disorder, recurrent, in remission, unspecified: Secondary | ICD-10-CM

## 2021-07-14 DIAGNOSIS — J4550 Severe persistent asthma, uncomplicated: Secondary | ICD-10-CM | POA: Diagnosis not present

## 2021-07-14 DIAGNOSIS — E539 Vitamin B deficiency, unspecified: Secondary | ICD-10-CM

## 2021-07-14 DIAGNOSIS — R739 Hyperglycemia, unspecified: Secondary | ICD-10-CM | POA: Diagnosis not present

## 2021-07-14 DIAGNOSIS — E559 Vitamin D deficiency, unspecified: Secondary | ICD-10-CM | POA: Diagnosis not present

## 2021-07-14 MED ORDER — HYDROXYZINE HCL 25 MG PO TABS
25.0000 mg | ORAL_TABLET | Freq: Four times a day (QID) | ORAL | 11 refills | Status: DC | PRN
  Filled 2021-07-14: qty 60, 8d supply, fill #0

## 2021-07-14 MED ORDER — LORATADINE 10 MG PO TABS
10.0000 mg | ORAL_TABLET | Freq: Every day | ORAL | 11 refills | Status: DC
  Filled 2021-07-14: qty 90, 90d supply, fill #0
  Filled 2021-07-14: qty 30, 30d supply, fill #0
  Filled 2021-10-25: qty 90, 90d supply, fill #1
  Filled 2022-04-03 – 2022-05-26 (×2): qty 90, 90d supply, fill #2

## 2021-07-14 MED ORDER — TRELEGY ELLIPTA 200-62.5-25 MCG/ACT IN AEPB
INHALATION_SPRAY | RESPIRATORY_TRACT | 11 refills | Status: DC
  Filled 2021-07-14: qty 60, 30d supply, fill #0
  Filled 2021-10-25: qty 60, 30d supply, fill #1

## 2021-07-14 MED ORDER — PREDNISONE 10 MG PO TABS
ORAL_TABLET | Freq: Every day | ORAL | 2 refills | Status: DC
  Filled 2021-07-14: qty 48, 12d supply, fill #0
  Filled 2022-06-19: qty 48, 12d supply, fill #1

## 2021-07-14 MED ORDER — ALBUTEROL SULFATE (2.5 MG/3ML) 0.083% IN NEBU
2.5000 mg | INHALATION_SOLUTION | RESPIRATORY_TRACT | 12 refills | Status: DC | PRN
  Filled 2021-07-14: qty 75, 10d supply, fill #0
  Filled 2022-05-26 – 2022-06-01 (×3): qty 75, 10d supply, fill #1

## 2021-07-14 MED ORDER — ACETAMINOPHEN ER 650 MG PO TBCR
650.0000 mg | EXTENDED_RELEASE_TABLET | Freq: Three times a day (TID) | ORAL | 3 refills | Status: DC | PRN
  Filled 2021-07-14: qty 100, 34d supply, fill #0

## 2021-07-14 MED ORDER — EPINEPHRINE 0.3 MG/0.3ML IJ SOAJ
0.3000 mg | Freq: Once | INTRAMUSCULAR | 1 refills | Status: AC
  Filled 2021-07-14: qty 0.6, 2d supply, fill #0

## 2021-07-14 MED ORDER — CYANOCOBALAMIN 1000 MCG/ML IJ SOLN
1000.0000 ug | INTRAMUSCULAR | 11 refills | Status: DC
  Filled 2021-07-14: qty 3, 90d supply, fill #0

## 2021-07-14 MED ORDER — BUPROPION HCL ER (XL) 150 MG PO TB24
150.0000 mg | ORAL_TABLET | ORAL | 3 refills | Status: DC
  Filled 2021-07-14: qty 90, 90d supply, fill #0

## 2021-07-14 MED ORDER — ALBUTEROL SULFATE HFA 108 (90 BASE) MCG/ACT IN AERS
1.0000 | INHALATION_SPRAY | Freq: Four times a day (QID) | RESPIRATORY_TRACT | 11 refills | Status: DC | PRN
  Filled 2021-07-14: qty 6.7, 25d supply, fill #0
  Filled 2021-10-25: qty 6.7, 25d supply, fill #1
  Filled 2022-04-03: qty 6.7, 25d supply, fill #2
  Filled 2022-04-25: qty 6.7, 25d supply, fill #3
  Filled 2022-05-26: qty 6.7, 25d supply, fill #4
  Filled 2022-06-19: qty 13.4, 50d supply, fill #5

## 2021-07-14 NOTE — Progress Notes (Signed)
Orma Render, DNP, AGNP-c [facility] [address]  [place], [place] [address] [phone]  New patient visit   Patient: Jill Summers   DOB: 10-31-1985   36 y.o. Female  MRN: [id number] Visit Date: 07/14/2021  Patient Care Team: Heavan Francom, Coralee Pesa, NP as PCP - General (Nurse Practitioner) Magrinat, Virgie Dad, MD (Inactive) as Consulting Physician (Oncology)  Today's Vitals   07/14/21 1542  BP: (!) 132/96  Pulse: 78  SpO2: 100%  Weight: 216 lb 9.6 oz (98.2 kg)  Height: $Remove'5\' 3"'lvtcAtB$  (1.6 m)   Body mass index is 38.37 kg/m.   Today's healthcare provider: Orma Render, NP   Chief Complaint  Patient presents with   New Patient (Initial Visit)    Pt here for initial visit to establish care   Subjective    Jill Summers is a 36 y.o. female who presents today as a new patient to establish care.    Patient endorses the following concerns presently: HIves with Uritcaria -Onset about 16 years ago during her first pregnancy - daily if allergy medication is not taken - all over the body when it does occur - lips sometimes swell randomly - usually improves by the next day - never had autoimmune testing, has had allergy testing and was positive for severe allergies - has chronic anemia, not sure if this is associated -Symptoms typically resolve or do not appear when she is taking her allergy medication routinely -She has had significant swelling of the lips but no known swelling of the tongue or throat  Chronic b12 deficiency -Was paralyzed at one point from the b12 deficiency  Constipation - Lifelong problem -Bowel movements are typically limited to about twice a week -Bowel movements are not painful or particularly hard but do require a significant amount of force to be expelled  Asthma exacerbation - recently had an exacerbation that was ongoing for several hours - severe difficulty breathing with no relief with  nebulizer being used up to every 15 minutes -She did go to the ED for treatment and symptoms eventually resolved -She has not had any additional exacerbations since that time  History reviewed and reveals the following: Past Medical History:  Diagnosis Date   Adopted    Asthma    since baby   Bacterial vaginosis    Chlamydia    Depression    At age 16   MRSA infection    Obesity    Urinary tract infection    Past Surgical History:  Procedure Laterality Date   CESAREAN SECTION     CESAREAN SECTION WITH BILATERAL TUBAL LIGATION     No family status information on file.   Family History  Adopted: Yes   Social History   Socioeconomic History   Marital status: Single    Spouse name: Not on file   Number of children: Not on file   Years of education: Not on file   Highest education level: Not on file  Occupational History   Not on file  Tobacco Use   Smoking status: Some Days    Types: Cigars    Passive exposure: Never   Smokeless tobacco: Never  Vaping Use   Vaping Use: Never used  Substance and Sexual Activity   Alcohol use: No    Alcohol/week: 2.0 standard drinks of alcohol    Types: 2 Standard drinks or equivalent per week   Drug use: Yes    Types: Marijuana  Comment: Rolled in tobacco   Sexual activity: Yes    Birth control/protection: Surgical  Other Topics Concern   Not on file  Social History Narrative   Not on file   Social Determinants of Health   Financial Resource Strain: Not on file  Food Insecurity: Not on file  Transportation Needs: Not on file  Physical Activity: Not on file  Stress: Not on file  Social Connections: Not on file   Outpatient Medications Prior to Visit  Medication Sig   [DISCONTINUED] acetaminophen (TYLENOL 8 HOUR) 650 MG CR tablet Take 1 tablet (650 mg total) by mouth every 8 (eight) hours as needed.   [DISCONTINUED] albuterol (PROVENTIL HFA;VENTOLIN HFA) 108 (90 Base) MCG/ACT inhaler Inhale 2 puffs into the lungs  every 6 (six) hours as needed for wheezing or shortness of breath. For asthma   [DISCONTINUED] albuterol (PROVENTIL) (5 MG/ML) 0.5% nebulizer solution Take 0.5 mLs (2.5 mg total) by nebulization every 4 (four) hours as needed for wheezing or shortness of breath.   [DISCONTINUED] cyanocobalamin (,VITAMIN B-12,) 1000 MCG/ML injection Inject 1 mL (1,000 mcg total) into the muscle every 30 (thirty) days.   [DISCONTINUED] hydrOXYzine (ATARAX/VISTARIL) 25 MG tablet Take 1-2 tablets (25-50 mg total) by mouth every 6 (six) hours as needed for anxiety.   [DISCONTINUED] loratadine (CLARITIN) 10 MG tablet Take 1 tablet (10 mg total) by mouth daily for 14 days.   No facility-administered medications prior to visit.   Allergies  Allergen Reactions   Peanut-Containing Drug Products Anaphylaxis, Hives and Swelling    *Throat swelling*   Shellfish Allergy Anaphylaxis, Hives and Swelling    Throat swelling   Aleve [Naproxen]     There is no immunization history on file for this patient.  Review of Systems All review of systems negative except what is listed in the HPI   Objective    BP (!) 132/96   Pulse 78   Ht $R'5\' 3"'VG$  (1.6 m)   Wt 216 lb 9.6 oz (98.2 kg)   SpO2 100%   BMI 38.37 kg/m  Physical Exam Vitals and nursing note reviewed.  Constitutional:      General: She is not in acute distress.    Appearance: Normal appearance.  Eyes:     Extraocular Movements: Extraocular movements intact.     Conjunctiva/sclera: Conjunctivae normal.     Pupils: Pupils are equal, round, and reactive to light.  Neck:     Vascular: No carotid bruit.  Cardiovascular:     Rate and Rhythm: Normal rate and regular rhythm.     Pulses: Normal pulses.     Heart sounds: Normal heart sounds. No murmur heard. Pulmonary:     Effort: Pulmonary effort is normal.     Breath sounds: Wheezing present.  Abdominal:     General: Bowel sounds are normal.     Palpations: Abdomen is soft.  Musculoskeletal:        General:  Normal range of motion.     Cervical back: Normal range of motion.     Right lower leg: No edema.     Left lower leg: No edema.  Skin:    General: Skin is warm and dry.     Capillary Refill: Capillary refill takes less than 2 seconds.     Findings: No rash.  Neurological:     General: No focal deficit present.     Mental Status: She is alert and oriented to person, place, and time.  Psychiatric:  Mood and Affect: Mood normal.        Behavior: Behavior normal.        Thought Content: Thought content normal.        Judgment: Judgment normal.     Results for orders placed or performed in visit on 07/14/21  B12 and Folate Panel  Result Value Ref Range   Vitamin B-12 354 232 - 1,245 pg/mL   Folate 10.2 >3.0 ng/mL  CBC With Diff/Platelet  Result Value Ref Range   WBC 6.1 3.4 - 10.8 x10E3/uL   RBC 4.58 3.77 - 5.28 x10E6/uL   Hemoglobin 12.5 11.1 - 15.9 g/dL   Hematocrit 37.7 34.0 - 46.6 %   MCV 82 79 - 97 fL   MCH 27.3 26.6 - 33.0 pg   MCHC 33.2 31.5 - 35.7 g/dL   RDW 22.1 (H) 11.7 - 15.4 %   Platelets 246 150 - 450 x10E3/uL   Neutrophils 51 Not Estab. %   Lymphs 39 Not Estab. %   Monocytes 7 Not Estab. %   Eos 3 Not Estab. %   Basos 0 Not Estab. %   Neutrophils Absolute 3.0 1.4 - 7.0 x10E3/uL   Lymphocytes Absolute 2.4 0.7 - 3.1 x10E3/uL   Monocytes Absolute 0.4 0.1 - 0.9 x10E3/uL   EOS (ABSOLUTE) 0.2 0.0 - 0.4 x10E3/uL   Basophils Absolute 0.0 0.0 - 0.2 x10E3/uL   Immature Granulocytes 0 Not Estab. %   Immature Grans (Abs) 0.0 0.0 - 0.1 x10E3/uL   Hematology Comments: Note:   Comprehensive metabolic panel  Result Value Ref Range   Glucose 87 70 - 99 mg/dL   BUN 9 6 - 20 mg/dL   Creatinine, Ser 0.86 0.57 - 1.00 mg/dL   eGFR 89 >59 mL/min/1.73   BUN/Creatinine Ratio 10 9 - 23   Sodium 139 134 - 144 mmol/L   Potassium 4.5 3.5 - 5.2 mmol/L   Chloride 107 (H) 96 - 106 mmol/L   CO2 20 20 - 29 mmol/L   Calcium 8.8 8.7 - 10.2 mg/dL   Total Protein 6.8 6.0 - 8.5  g/dL   Albumin 4.0 3.8 - 4.8 g/dL   Globulin, Total 2.8 1.5 - 4.5 g/dL   Albumin/Globulin Ratio 1.4 1.2 - 2.2   Bilirubin Total 0.3 0.0 - 1.2 mg/dL   Alkaline Phosphatase 53 44 - 121 IU/L   AST 17 0 - 40 IU/L   ALT 15 0 - 32 IU/L  Hemoglobin A1c  Result Value Ref Range   Hgb A1c MFr Bld 5.1 4.8 - 5.6 %   Est. average glucose Bld gHb Est-mCnc 100 mg/dL  Iron, TIBC and Ferritin Panel  Result Value Ref Range   Total Iron Binding Capacity 263 250 - 450 ug/dL   UIBC 131 131 - 425 ug/dL   Iron 132 27 - 159 ug/dL   Iron Saturation 50 15 - 55 %   Ferritin 338 (H) 15 - 150 ng/mL  VITAMIN D 25 Hydroxy (Vit-D Deficiency, Fractures)  Result Value Ref Range   Vit D, 25-Hydroxy 12.6 (L) 30.0 - 100.0 ng/mL  ANA+ENA+DNA/DS+Scl 70+SjoSSA/B  Result Value Ref Range   ANA Titer 1 Negative    dsDNA Ab <1 0 - 9 IU/mL   ENA RNP Ab <0.2 0.0 - 0.9 AI   ENA SM Ab Ser-aCnc <0.2 0.0 - 0.9 AI   Scleroderma (Scl-70) (ENA) Antibody, IgG <0.2 0.0 - 0.9 AI   ENA SSA (RO) Ab <0.2 0.0 - 0.9 AI   ENA  SSB (LA) Ab <0.2 0.0 - 0.9 AI    Assessment & Plan      Problem List Items Addressed This Visit     Pancytopenia ([place]) (Chronic)    Chronic, historical.  We will review labs today and make changes to plan of care based on findings.      Relevant Medications   loratadine (CLARITIN) 10 MG tablet   hydrOXYzine (ATARAX) 25 MG tablet   cyanocobalamin (,VITAMIN B-12,) 1000 MCG/ML injection   albuterol (PROVENTIL) (2.5 MG/3ML) 0.083% nebulizer solution   albuterol (VENTOLIN HFA) 108 (90 Base) MCG/ACT inhaler   acetaminophen (TYLENOL 8 HOUR) 650 MG CR tablet   predniSONE (DELTASONE) 10 MG tablet   Other Relevant Orders   B12 and Folate Panel (Completed)   CBC With Diff/Platelet (Completed)   Comprehensive metabolic panel (Completed)   Hemoglobin A1c (Completed)   Iron, TIBC and Ferritin Panel (Completed)   VITAMIN D 25 Hydroxy (Vit-D Deficiency, Fractures) (Completed)   ANA+ENA+DNA/DS+Scl 70+SjoSSA/B  (Completed)   Vitamin B 12 deficiency - Primary (Chronic)    Chronic.  Significant reactions to low B12 levels including paraplegia in the past.  We will monitor lab levels today.  Refill provided on vitamin B12 injections for patient.  We will plan to make changes to plan of care as necessary based on findings of lab work.  Patient is aware to notify immediately if she begins to develop new or worsening symptoms.      Relevant Medications   loratadine (CLARITIN) 10 MG tablet   hydrOXYzine (ATARAX) 25 MG tablet   cyanocobalamin (,VITAMIN B-12,) 1000 MCG/ML injection   albuterol (PROVENTIL) (2.5 MG/3ML) 0.083% nebulizer solution   albuterol (VENTOLIN HFA) 108 (90 Base) MCG/ACT inhaler   acetaminophen (TYLENOL 8 HOUR) 650 MG CR tablet   predniSONE (DELTASONE) 10 MG tablet   Other Relevant Orders   B12 and Folate Panel (Completed)   CBC With Diff/Platelet (Completed)   Comprehensive metabolic panel (Completed)   Hemoglobin A1c (Completed)   Iron, TIBC and Ferritin Panel (Completed)   VITAMIN D 25 Hydroxy (Vit-D Deficiency, Fractures) (Completed)   ANA+ENA+DNA/DS+Scl 70+SjoSSA/B (Completed)   Polyneuropathy due to vitamin B deficiency (HCC) (Chronic)    Historical.  We will monitor B12 levels today.      Relevant Medications   loratadine (CLARITIN) 10 MG tablet   hydrOXYzine (ATARAX) 25 MG tablet   cyanocobalamin (,VITAMIN B-12,) 1000 MCG/ML injection   albuterol (PROVENTIL) (2.5 MG/3ML) 0.083% nebulizer solution   albuterol (VENTOLIN HFA) 108 (90 Base) MCG/ACT inhaler   acetaminophen (TYLENOL 8 HOUR) 650 MG CR tablet   predniSONE (DELTASONE) 10 MG tablet   buPROPion (WELLBUTRIN XL) 150 MG 24 hr tablet   Other Relevant Orders   B12 and Folate Panel (Completed)   CBC With Diff/Platelet (Completed)   Comprehensive metabolic panel (Completed)   Hemoglobin A1c (Completed)   Iron, TIBC and Ferritin Panel (Completed)   VITAMIN D 25 Hydroxy (Vit-D Deficiency, Fractures) (Completed)    ANA+ENA+DNA/DS+Scl 70+SjoSSA/B (Completed)   History of paraplegia    Reported previously due to B12 deficiency.  Will monitor B12 levels today to ensure that these are within the normal limits.  She is not experiencing any symptoms at this time.  Range of motion normal in all extremities with sensation intact.      Major depressive disorder, recurrent, in remission (HCC)    Chronic.  Currently well controlled with no alarm symptoms present today.  She is currently using hydroxyzine as needed for anxiety symptoms.  Denies SI/HI today.  We will continue to follow.      Relevant Medications   loratadine (CLARITIN) 10 MG tablet   hydrOXYzine (ATARAX) 25 MG tablet   cyanocobalamin (,VITAMIN B-12,) 1000 MCG/ML injection   albuterol (PROVENTIL) (2.5 MG/3ML) 0.083% nebulizer solution   albuterol (VENTOLIN HFA) 108 (90 Base) MCG/ACT inhaler   acetaminophen (TYLENOL 8 HOUR) 650 MG CR tablet   predniSONE (DELTASONE) 10 MG tablet   buPROPion (WELLBUTRIN XL) 150 MG 24 hr tablet   Other Relevant Orders   B12 and Folate Panel (Completed)   CBC With Diff/Platelet (Completed)   Comprehensive metabolic panel (Completed)   Hemoglobin A1c (Completed)   Iron, TIBC and Ferritin Panel (Completed)   VITAMIN D 25 Hydroxy (Vit-D Deficiency, Fractures) (Completed)   ANA+ENA+DNA/DS+Scl 70+SjoSSA/B (Completed)   Constipation    Chronic.  Currently having bowel movement approximately every 5 days.  There is no pain and the stool is not particularly hard when expelled however exhalation is difficult at times.  Possibly related to neurological etiology versus idiopathic.  Discussed with patient the importance of drinking plenty of water and utilization of MiraLAX and Colace on a routine basis.  She has been evaluated by GI in the past.  We can consider formulations such as Linzess if the patient wishes.  We will obtain some labs today to ensure that there is no underlying etiology that could be contributing to this.  We  will plan to follow-up based on labs.      Relevant Medications   loratadine (CLARITIN) 10 MG tablet   hydrOXYzine (ATARAX) 25 MG tablet   cyanocobalamin (,VITAMIN B-12,) 1000 MCG/ML injection   albuterol (PROVENTIL) (2.5 MG/3ML) 0.083% nebulizer solution   albuterol (VENTOLIN HFA) 108 (90 Base) MCG/ACT inhaler   acetaminophen (TYLENOL 8 HOUR) 650 MG CR tablet   predniSONE (DELTASONE) 10 MG tablet   Other Relevant Orders   B12 and Folate Panel (Completed)   CBC With Diff/Platelet (Completed)   Comprehensive metabolic panel (Completed)   Hemoglobin A1c (Completed)   Iron, TIBC and Ferritin Panel (Completed)   VITAMIN D 25 Hydroxy (Vit-D Deficiency, Fractures) (Completed)   ANA+ENA+DNA/DS+Scl 70+SjoSSA/B (Completed)   Iron deficiency anemia    Chronic, historical.  We will review labs today and make changes to plan of care based on findings as appropriate.      Relevant Medications   loratadine (CLARITIN) 10 MG tablet   hydrOXYzine (ATARAX) 25 MG tablet   cyanocobalamin (,VITAMIN B-12,) 1000 MCG/ML injection   albuterol (PROVENTIL) (2.5 MG/3ML) 0.083% nebulizer solution   albuterol (VENTOLIN HFA) 108 (90 Base) MCG/ACT inhaler   acetaminophen (TYLENOL 8 HOUR) 650 MG CR tablet   predniSONE (DELTASONE) 10 MG tablet   Other Relevant Orders   B12 and Folate Panel (Completed)   CBC With Diff/Platelet (Completed)   Comprehensive metabolic panel (Completed)   Hemoglobin A1c (Completed)   Iron, TIBC and Ferritin Panel (Completed)   VITAMIN D 25 Hydroxy (Vit-D Deficiency, Fractures) (Completed)   ANA+ENA+DNA/DS+Scl 70+SjoSSA/B (Completed)   Severe persistent asthma    Chronic.  Recent exacerbation with continued wheezing present.  Discussed with patient the option of starting triple therapy for her symptoms and she is in agreement to this.  Given the severity of her symptoms I do feel that triple therapy is the most appropriate escalation at this point to help prevent exacerbations and  emergency room visits for these.  We will send in prescription for Trelegy Ellipta 200 mcg today.  She is aware  to continue to utilize albuterol inhaler or nebulizer for acute relief.  Patient will follow-up if symptoms worsen or fail to improve with treatment.      Relevant Medications   loratadine (CLARITIN) 10 MG tablet   hydrOXYzine (ATARAX) 25 MG tablet   cyanocobalamin (,VITAMIN B-12,) 1000 MCG/ML injection   albuterol (PROVENTIL) (2.5 MG/3ML) 0.083% nebulizer solution   albuterol (VENTOLIN HFA) 108 (90 Base) MCG/ACT inhaler   acetaminophen (TYLENOL 8 HOUR) 650 MG CR tablet   predniSONE (DELTASONE) 10 MG tablet   buPROPion (WELLBUTRIN XL) 150 MG 24 hr tablet   Fluticasone-Umeclidin-Vilant (TRELEGY ELLIPTA) 200-62.5-25 MCG/ACT AEPB   Other Relevant Orders   B12 and Folate Panel (Completed)   CBC With Diff/Platelet (Completed)   Comprehensive metabolic panel (Completed)   Hemoglobin A1c (Completed)   Iron, TIBC and Ferritin Panel (Completed)   VITAMIN D 25 Hydroxy (Vit-D Deficiency, Fractures) (Completed)   ANA+ENA+DNA/DS+Scl 70+SjoSSA/B (Completed)   Urticaria of unknown origin    Chronic for the past 16 years of unknown etiology.  Patient has had testing for allergens in the past and does have some significant allergies however no clear indication of what could be triggering the daily urticaria.  This is fairly well controlled with the use of loratadine.  If she does miss a dose the urticaria returns immediately but will resolve once she begins using the loratadine again.  At this point I do feel that a referral to the allergy specialist is warranted for further evaluation.  I we will review labs today to see if there are any indicators of what could be contributing to her symptoms.  Provide patient with an EpiPen given her significant allergies to peanut containing products and shellfish as well as the swelling of her lips.  Patient instructed on how to use medication and keep with her  at all times for emergency use.  We will continue to monitor.      Relevant Medications   predniSONE (DELTASONE) 10 MG tablet   Other Relevant Orders   B12 and Folate Panel (Completed)   CBC With Diff/Platelet (Completed)   Comprehensive metabolic panel (Completed)   Hemoglobin A1c (Completed)   Iron, TIBC and Ferritin Panel (Completed)   VITAMIN D 25 Hydroxy (Vit-D Deficiency, Fractures) (Completed)   ANA+ENA+DNA/DS+Scl 70+SjoSSA/B (Completed)   Other Visit Diagnoses     Hyperglycemia, unspecified       Relevant Orders   Hemoglobin A1c (Completed)   VITAMIN D 25 Hydroxy (Vit-D Deficiency, Fractures) (Completed)   Vitamin D deficiency       Relevant Orders   Hemoglobin A1c (Completed)   VITAMIN D 25 Hydroxy (Vit-D Deficiency, Fractures) (Completed)       To be determined based on labs.   Time:  60 minutes, >50% spent counseling, care coordination, chart review, and documentation.    Jamiere Gulas, Coralee Pesa, NP, DNP, AGNP-C [facility] at [place]

## 2021-07-14 NOTE — Patient Instructions (Signed)
Thank you for choosing [place] at [facility] for your Primary Care needs. I am excited for the opportunity to partner with you to meet your health care goals. It was a pleasure meeting you today!  Recommendations from today's visit: Docusate sodium is the name of the stool softener you can try  Information on diet, exercise, and health maintenance recommendations are listed below. This is information to help you be sure you are on track for optimal health and monitoring.   Please look over this and let us know if you have any questions or if you have completed any of the health maintenance outside of [place] so that we can be sure your records are up to date.  ___________________________________________________________ About Me: I am an Adult-Geriatric Nurse Practitioner with a background in caring for patients for more than 20 years with a strong intensive care background. I provide primary care and sports medicine services to patients age 23 and older within this office. My education had a strong focus on caring for the older adult population, which I am passionate about. I am also the director of the APP Fellowship with [facility].   My desire is to provide you with the best service through preventive medicine and supportive care. I consider you a part of the medical team and value your input. I work diligently to ensure that you are heard and your needs are met in a safe and effective manner. I want you to feel comfortable with me as your provider and want you to know that your health concerns are important to me.  For your information, our office hours are: Monday, Tuesday, and Thursday 8:00 AM - 5:00 PM Wednesday and Friday 8:00 AM - 12:00 PM.   In my time away from the office I am teaching new APP's within the system and am unavailable, but my partner, Dr. Burnard Bunting is in the office for emergent needs.   If you have questions or concerns, please call our office  at [phone] or send [place] a MyChart message and we will respond as quickly as possible.  ____________________________________________________________ MyChart:  For all urgent or time sensitive needs we ask that you please call the office to avoid delays. Our number is [phone]. MyChart is not constantly monitored and due to the large volume of messages a day, replies may take up to 72 business hours.  MyChart Policy: MyChart allows for you to see your visit notes, after visit summary, provider recommendations, lab and tests results, make an appointment, request refills, and contact your provider or the office for non-urgent questions or concerns. Providers are seeing patients during normal business hours and do not have built in time to review MyChart messages.  We ask that you allow a minimum of 3 business days for responses to [employer]. For this reason, please do not send urgent requests through [employer]. Please call the office at [phone]. New and ongoing conditions may require a visit. We have virtual and in person visit available for your convenience.  Complex MyChart concerns may require a visit. Your provider may request you schedule a virtual or in person visit to ensure we are providing the best care possible. MyChart messages sent after 11:00 AM on Friday will not be received by the provider until Monday morning.    Lab and Test Results: You will receive your lab and test results on MyChart as soon as they are completed and results have been sent by the lab or  testing facility. Due to this service, you will receive your results BEFORE your provider.  I review lab and tests results each morning prior to seeing patients. Some results require collaboration with other providers to ensure you are receiving the most appropriate care. For this reason, we ask that you please allow a minimum of 3-5 business days from the time the ALL results have been received for your provider to  receive and review lab and test results and contact you about these.  Most lab and test result comments from the provider will be sent through [place]. Your provider may recommend changes to the plan of care, follow-up visits, repeat testing, ask questions, or request an office visit to discuss these results. You may reply directly to this message or call the office at [phone] to provide information for the provider or set up an appointment. In some instances, you will be called with test results and recommendations. Please let us know if this is preferred and we will make note of this in your chart to provide this for you.    If you have not heard a response to your lab or test results in 5 business days from all results returning to [place], please call the office to let us know. We ask that you please avoid calling prior to this time unless there is an emergent concern. Due to high call volumes, this can delay the resulting process.  After Hours: For all non-emergency after hours needs, please call the office at [phone] and select the option to reach the on-call provider service. On-call services are shared between multiple [place] offices and therefore it will not be possible to speak directly with your provider. On-call providers may provide medical advice and recommendations, but are unable to provide refills for maintenance medications.  For all emergency or urgent medical needs after normal business hours, we recommend that you seek care at the closest Urgent Care or Emergency Department to ensure appropriate treatment in a timely manner.  [facility] has a 24 hour emergency room located on the ground floor for your convenience.   Urgent Concerns During the Business Day Providers are seeing patients from 8AM to [place] with a busy schedule and are most often not able to respond to non-urgent calls until the end of the day or the next business day. If you should  have URGENT concerns during the day, please call and speak to the nurse or schedule a same day appointment so that we can address your concern without delay.   Thank you, again, for choosing me as your health care partner. I appreciate your trust and look forward to learning more about you.   Jill Keeler, DNP, AGNP-c ___________________________________________________________  Health Maintenance Recommendations Screening Testing Mammogram Every 1 -2 years based on history and risk factors Starting at age 65 Pap Smear Ages 21-39 every 3 years Ages 40-65 every 5 years with HPV testing More frequent testing may be required based on results and history Colon Cancer Screening Every 1-10 years based on test performed, risk factors, and history Starting at age 11 Bone Density Screening Every 2-10 years based on history Starting at age 32 for women Recommendations for men differ based on medication usage, history, and risk factors AAA Screening One time ultrasound Men 28-4 years old who have every smoked Lung Cancer Screening Low Dose Lung CT every 12 months Age 20-80 years with a 30 pack-year smoking history who still smoke or who have quit within  the last 15 years  Screening Labs Routine  Labs: Complete Blood Count (CBC), Complete Metabolic Panel (CMP), Cholesterol (Lipid Panel) Every 6-12 months based on history and medications May be recommended more frequently based on current conditions or previous results Hemoglobin A1c Lab Every 3-12 months based on history and previous results Starting at age 52 or earlier with diagnosis of diabetes, high cholesterol, BMI >26, and/or risk factors Frequent monitoring for patients with diabetes to ensure blood sugar control Thyroid Panel (TSH w/ T3 & T4) Every 6 months based on history, symptoms, and risk factors May be repeated more often if on medication HIV One time testing for all patients 22 and older May be repeated more frequently  for patients with increased risk factors or exposure Hepatitis C One time testing for all patients 31 and older May be repeated more frequently for patients with increased risk factors or exposure Gonorrhea, Chlamydia Every 12 months for all sexually active persons 13-24 years Additional monitoring may be recommended for those who are considered high risk or who have symptoms PSA Men 82-51 years old with risk factors Additional screening may be recommended from age 15-69 based on risk factors, symptoms, and history  Vaccine Recommendations Tetanus Booster All adults every 10 years Flu Vaccine All patients 6 months and older every year COVID Vaccine All patients 12 years and older Initial dosing with booster May recommend additional booster based on age and health history HPV Vaccine 2 doses all patients age 74-26 Dosing may be considered for patients over 26 Shingles Vaccine (Shingrix) 2 doses all adults 46 years and older Pneumonia (Pneumovax 23) All adults 58 years and older May recommend earlier dosing based on health history Pneumonia (Prevnar 41) All adults 37 years and older Dosed 1 year after Pneumovax 23  Additional Screening, Testing, and Vaccinations may be recommended on an individualized basis based on family history, health history, risk factors, and/or exposure.  __________________________________________________________  Diet Recommendations for All Patients  I recommend that all patients maintain a diet low in saturated fats, carbohydrates, and cholesterol. While this can be challenging at first, it is not impossible and small changes can make big differences.  Things to try: Decreasing the amount of soda, sweet tea, and/or juice to one or less per day and replace with water While water is always the first choice, if you do not like water you may consider adding a water additive without sugar to improve the taste other sugar free drinks Replace potatoes with  a brightly colored vegetable at dinner Use healthy oils, such as canola oil or olive oil, instead of butter or hard margarine Limit your bread intake to two pieces or less a day Replace regular pasta with low carb pasta options Bake, broil, or grill foods instead of frying Monitor portion sizes  Eat smaller, more frequent meals throughout the day instead of large meals  An important thing to remember is, if you love foods that are not great for your health, you don't have to give them up completely. Instead, allow these foods to be a reward when you have done well. Allowing yourself to still have special treats every once in a while is a nice way to tell yourself thank you for working hard to keep yourself healthy.   Also remember that every day is a new day. If you have a bad day and "fall off the wagon", you can still climb right back up and keep moving along on your journey!  We have resources  available to help you!  Some websites that may be helpful include: www.http://carter.biz/  Www.VeryWellFit.com _____________________________________________________________  Activity Recommendations for All Patients  I recommend that all adults get at least 20 minutes of moderate physical activity that elevates your heart rate at least 5 days out of the week.  Some examples include: Walking or jogging at a pace that allows you to carry on a conversation Cycling (stationary bike or outdoors) Water aerobics Yoga Weight lifting Dancing If physical limitations prevent you from putting stress on your joints, exercise in a pool or seated in a chair are excellent options.  Do determine your MAXIMUM heart rate for activity: YOUR AGE - 220 = MAX HeartRate   Remember! Do not push yourself too hard.  Start slowly and build up your pace, speed, weight, time in exercise, etc.  Allow your body to rest between exercise and get good sleep. You will need more water than normal when you are exerting yourself. Do  not wait until you are thirsty to drink. Drink with a purpose of getting in at least 8, 8 ounce glasses of water a day plus more depending on how much you exercise and sweat.    If you begin to develop dizziness, chest pain, abdominal pain, jaw pain, shortness of breath, headache, vision changes, lightheadedness, or other concerning symptoms, stop the activity and allow your body to rest. If your symptoms are severe, seek emergency evaluation immediately. If your symptoms are concerning, but not severe, please let us know so that we can recommend further evaluation.

## 2021-07-15 ENCOUNTER — Inpatient Hospital Stay: Payer: Medicare Other

## 2021-07-15 ENCOUNTER — Other Ambulatory Visit (HOSPITAL_BASED_OUTPATIENT_CLINIC_OR_DEPARTMENT_OTHER): Payer: Self-pay

## 2021-07-15 ENCOUNTER — Telehealth: Payer: Self-pay

## 2021-07-15 NOTE — Telephone Encounter (Signed)
Pt did not show for injection apt today. Called pt and left VM requesting that she call scheduling to reschedule injection.

## 2021-07-18 LAB — ANA+ENA+DNA/DS+SCL 70+SJOSSA/B
ANA Titer 1: NEGATIVE
ENA RNP Ab: <0.2 AI (ref 0.0–0.9)
ENA SM Ab Ser-aCnc: <0.2 AI (ref 0.0–0.9)
ENA SSA (RO) Ab: <0.2 AI (ref 0.0–0.9)
ENA SSB (LA) Ab: <0.2 AI (ref 0.0–0.9)
Scleroderma (Scl-70) (ENA) Antibody, IgG: <0.2 AI (ref 0.0–0.9)
dsDNA Ab: <1 IU/mL (ref 0–9)

## 2021-07-18 LAB — CBC WITH DIFF/PLATELET
Basophils Absolute: 0 10*3/uL (ref 0.0–0.2)
Basos: 0 %
EOS (ABSOLUTE): 0.2 10*3/uL (ref 0.0–0.4)
Eos: 3 %
Hematocrit: 37.7 % (ref 34.0–46.6)
Hemoglobin: 12.5 g/dL (ref 11.1–15.9)
Immature Grans (Abs): 0 10*3/uL (ref 0.0–0.1)
Immature Granulocytes: 0 %
Lymphocytes Absolute: 2.4 10*3/uL (ref 0.7–3.1)
Lymphs: 39 %
MCH: 27.3 pg (ref 26.6–33.0)
MCHC: 33.2 g/dL (ref 31.5–35.7)
MCV: 82 fL (ref 79–97)
Monocytes Absolute: 0.4 10*3/uL (ref 0.1–0.9)
Monocytes: 7 %
Neutrophils Absolute: 3 10*3/uL (ref 1.4–7.0)
Neutrophils: 51 %
Platelets: 246 10*3/uL (ref 150–450)
RBC: 4.58 x10E6/uL (ref 3.77–5.28)
RDW: 22.1 % — ABNORMAL HIGH (ref 11.7–15.4)
WBC: 6.1 10*3/uL (ref 3.4–10.8)

## 2021-07-18 LAB — IRON,TIBC AND FERRITIN PANEL
Ferritin: 338 ng/mL — ABNORMAL HIGH (ref 15–150)
Iron Saturation: 50 % (ref 15–55)
Iron: 132 ug/dL (ref 27–159)
Total Iron Binding Capacity: 263 ug/dL (ref 250–450)
UIBC: 131 ug/dL (ref 131–425)

## 2021-07-18 LAB — COMPREHENSIVE METABOLIC PANEL
ALT: 15 IU/L (ref 0–32)
AST: 17 IU/L (ref 0–40)
Albumin/Globulin Ratio: 1.4 (ref 1.2–2.2)
Albumin: 4 g/dL (ref 3.8–4.8)
Alkaline Phosphatase: 53 IU/L (ref 44–121)
BUN/Creatinine Ratio: 10 (ref 9–23)
BUN: 9 mg/dL (ref 6–20)
Bilirubin Total: 0.3 mg/dL (ref 0.0–1.2)
CO2: 20 mmol/L (ref 20–29)
Calcium: 8.8 mg/dL (ref 8.7–10.2)
Chloride: 107 mmol/L — ABNORMAL HIGH (ref 96–106)
Creatinine, Ser: 0.86 mg/dL (ref 0.57–1.00)
Globulin, Total: 2.8 g/dL (ref 1.5–4.5)
Glucose: 87 mg/dL (ref 70–99)
Potassium: 4.5 mmol/L (ref 3.5–5.2)
Sodium: 139 mmol/L (ref 134–144)
Total Protein: 6.8 g/dL (ref 6.0–8.5)
eGFR: 89 mL/min/{1.73_m2} (ref >59)

## 2021-07-18 LAB — HEMOGLOBIN A1C
Est. average glucose Bld gHb Est-mCnc: 100 mg/dL
Hgb A1c MFr Bld: 5.1 % (ref 4.8–5.6)

## 2021-07-18 LAB — VITAMIN D 25 HYDROXY (VIT D DEFICIENCY, FRACTURES): Vit D, 25-Hydroxy: 12.6 ng/mL — ABNORMAL LOW (ref 30.0–100.0)

## 2021-07-18 LAB — B12 AND FOLATE PANEL
Folate: 10.2 ng/mL (ref >3.0)
Vitamin B-12: 354 pg/mL (ref 232–1245)

## 2021-07-22 ENCOUNTER — Ambulatory Visit: Payer: Medicare Other

## 2021-07-26 ENCOUNTER — Other Ambulatory Visit (HOSPITAL_BASED_OUTPATIENT_CLINIC_OR_DEPARTMENT_OTHER): Payer: Self-pay

## 2021-07-26 ENCOUNTER — Encounter: Payer: Self-pay | Admitting: Oncology

## 2021-07-26 DIAGNOSIS — E559 Vitamin D deficiency, unspecified: Secondary | ICD-10-CM

## 2021-07-26 MED ORDER — VITAMIN D (ERGOCALCIFEROL) 1.25 MG (50000 UNIT) PO CAPS
50000.0000 [IU] | ORAL_CAPSULE | ORAL | 0 refills | Status: DC
  Filled 2021-07-26: qty 14, 98d supply, fill #0

## 2021-07-29 ENCOUNTER — Encounter: Payer: Self-pay | Admitting: Oncology

## 2021-07-29 DIAGNOSIS — J4550 Severe persistent asthma, uncomplicated: Secondary | ICD-10-CM | POA: Insufficient documentation

## 2021-07-29 DIAGNOSIS — L509 Urticaria, unspecified: Secondary | ICD-10-CM | POA: Insufficient documentation

## 2021-07-29 NOTE — Assessment & Plan Note (Signed)
Chronic, historical.  We will review labs today and make changes to plan of care based on findings.

## 2021-07-29 NOTE — Assessment & Plan Note (Signed)
Chronic, historical.  We will review labs today and make changes to plan of care based on findings as appropriate.

## 2021-07-29 NOTE — Assessment & Plan Note (Signed)
Chronic.  Recent exacerbation with continued wheezing present.  Discussed with patient the option of starting triple therapy for her symptoms and she is in agreement to this.  Given the severity of her symptoms I do feel that triple therapy is the most appropriate escalation at this point to help prevent exacerbations and emergency room visits for these.  We will send in prescription for Trelegy Ellipta 200 mcg today.  She is aware to continue to utilize albuterol inhaler or nebulizer for acute relief.  Patient will follow-up if symptoms worsen or fail to improve with treatment.

## 2021-07-29 NOTE — Assessment & Plan Note (Signed)
Chronic.  Significant reactions to low B12 levels including paraplegia in the past.  We will monitor lab levels today.  Refill provided on vitamin B12 injections for patient.  We will plan to make changes to plan of care as necessary based on findings of lab work.  Patient is aware to notify immediately if she begins to develop new or worsening symptoms.

## 2021-07-29 NOTE — Assessment & Plan Note (Signed)
Chronic for the past 16 years of unknown etiology.  Patient has had testing for allergens in the past and does have some significant allergies however no clear indication of what could be triggering the daily urticaria.  This is fairly well controlled with the use of loratadine.  If she does miss a dose the urticaria returns immediately but will resolve once she begins using the loratadine again.  At this point I do feel that a referral to the allergy specialist is warranted for further evaluation.  I we will review labs today to see if there are any indicators of what could be contributing to her symptoms.  Provide patient with an EpiPen given her significant allergies to peanut containing products and shellfish as well as the swelling of her lips.  Patient instructed on how to use medication and keep with her at all times for emergency use.  We will continue to monitor.

## 2021-07-29 NOTE — Assessment & Plan Note (Signed)
Reported previously due to B12 deficiency.  Will monitor B12 levels today to ensure that these are within the normal limits.  She is not experiencing any symptoms at this time.  Range of motion normal in all extremities with sensation intact.

## 2021-07-29 NOTE — Assessment & Plan Note (Signed)
Historical.  We will monitor B12 levels today.

## 2021-07-29 NOTE — Assessment & Plan Note (Signed)
Chronic.  Currently well controlled with no alarm symptoms present today.  She is currently using hydroxyzine as needed for anxiety symptoms.  Denies SI/HI today.  We will continue to follow.

## 2021-07-29 NOTE — Assessment & Plan Note (Signed)
Chronic.  Currently having bowel movement approximately every 5 days.  There is no pain and the stool is not particularly hard when expelled however exhalation is difficult at times.  Possibly related to neurological etiology versus idiopathic.  Discussed with patient the importance of drinking plenty of water and utilization of MiraLAX and Colace on a routine basis.  She has been evaluated by GI in the past.  We can consider formulations such as Linzess if the patient wishes.  We will obtain some labs today to ensure that there is no underlying etiology that could be contributing to this.  We will plan to follow-up based on labs.

## 2021-08-12 ENCOUNTER — Inpatient Hospital Stay: Payer: Medicare Other | Attending: Oncology

## 2021-09-09 ENCOUNTER — Inpatient Hospital Stay: Payer: Medicare Other | Admitting: Hematology and Oncology

## 2021-09-09 ENCOUNTER — Inpatient Hospital Stay: Payer: Medicare Other | Attending: Oncology

## 2021-09-09 ENCOUNTER — Inpatient Hospital Stay: Payer: Medicare Other

## 2021-10-09 ENCOUNTER — Encounter (HOSPITAL_BASED_OUTPATIENT_CLINIC_OR_DEPARTMENT_OTHER): Payer: Self-pay

## 2021-10-09 ENCOUNTER — Emergency Department (HOSPITAL_BASED_OUTPATIENT_CLINIC_OR_DEPARTMENT_OTHER)
Admission: EM | Admit: 2021-10-09 | Discharge: 2021-10-09 | Disposition: A | Discharge status: Home / Self Care | Payer: Medicare Other | Attending: Emergency Medicine | Admitting: Emergency Medicine

## 2021-10-09 ENCOUNTER — Other Ambulatory Visit: Payer: Self-pay

## 2021-10-09 ENCOUNTER — Emergency Department (HOSPITAL_BASED_OUTPATIENT_CLINIC_OR_DEPARTMENT_OTHER): Payer: Medicare Other | Admitting: Radiology

## 2021-10-09 DIAGNOSIS — W19XXXA Unspecified fall, initial encounter: Secondary | ICD-10-CM | POA: Diagnosis not present

## 2021-10-09 DIAGNOSIS — Z91010 Allergy to peanuts: Secondary | ICD-10-CM | POA: Insufficient documentation

## 2021-10-09 DIAGNOSIS — S93402A Sprain of unspecified ligament of left ankle, initial encounter: Secondary | ICD-10-CM | POA: Diagnosis not present

## 2021-10-09 DIAGNOSIS — M79672 Pain in left foot: Secondary | ICD-10-CM | POA: Diagnosis not present

## 2021-10-09 DIAGNOSIS — R600 Localized edema: Secondary | ICD-10-CM | POA: Diagnosis not present

## 2021-10-09 DIAGNOSIS — S99912A Unspecified injury of left ankle, initial encounter: Secondary | ICD-10-CM | POA: Diagnosis present

## 2021-10-09 MED ORDER — IBUPROFEN 400 MG PO TABS
600.0000 mg | ORAL_TABLET | Freq: Once | ORAL | Status: AC
  Administered 2021-10-09: 600 mg via ORAL
  Filled 2021-10-09: qty 1

## 2021-10-09 NOTE — ED Provider Notes (Signed)
[facility] EMERGENCY DEPT Provider Note   CSN: [id number] Arrival date & time: 10/09/21  1159     History  Chief Complaint  Patient presents with   Fall   HPI Jill Summers is a 36 y.o. female presenting for ankle injury status post fall.  Injury occurred last night around 9.  She was walking down the steps and tripped and fell landed on both hands and subsequently landed on her left ankle.  Since then the ankle has been swollen and she is unable to bear weight.  She denies head injury or loss of consciousness.  Is been taking ibuprofen and Tylenol.  States that there has been no swelling or pain or change in range of motion or sensation of her wrist.   Fall       Home Medications Prior to Admission medications   Medication Sig Start Date End Date Taking? Authorizing Provider  acetaminophen (TYLENOL 8 HOUR) 650 MG CR tablet Take 1 tablet (650 mg total) by mouth every 8 (eight) hours as needed. 07/14/21   [name], NP  albuterol (PROVENTIL) (2.5 MG/3ML) 0.083% nebulizer solution Take 3 mLs (2.5 mg total) by nebulization every 4 (four) hours as needed for wheezing or shortness of breath. 07/14/21   [name], NP  albuterol (VENTOLIN HFA) 108 (90 Base) MCG/ACT inhaler Inhale 1-2 puffs into the lungs every 6 (six) hours as needed for wheezing or shortness of breath 07/14/21   Early, Sung Amabile, NP  buPROPion (WELLBUTRIN XL) 150 MG 24 hr tablet Take 1 tablet (150 mg total) by mouth every morning. 07/14/21   [name], NP  cyanocobalamin (,VITAMIN B-12,) 1000 MCG/ML injection Inject 1 mL (1,000 mcg total) into the muscle every 30 (thirty) days. 07/14/21   [name], NP  Fluticasone-Umeclidin-Vilant (TRELEGY ELLIPTA) 200-62.5-25 MCG/ACT AEPB Use 1 inhalation daily for asthma. 07/14/21   [name], NP  hydrOXYzine (ATARAX) 25 MG tablet Take 1-2 tablets (25-50 mg total) by mouth every 6 (six) hours as needed for anxiety. 07/14/21   [name], NP  loratadine  (CLARITIN) 10 MG tablet Take 1 tablet (10 mg total) by mouth daily. 07/14/21   [name], NP  predniSONE (DELTASONE) 10 MG tablet 12 day taper PO at first sign of exacerbation 07/14/21   Early, Sung Amabile, NP  Vitamin D, Ergocalciferol, (DRISDOL) 1.25 MG (50000 UNIT) CAPS capsule Take 1 capsule (50,000 Units total) by mouth every 7 (seven) days. 07/26/21   [name], NP      Allergies    Peanut-containing drug products, Shellfish allergy, and Aleve [naproxen]    Review of Systems   Review of Systems  Physical Exam Updated Vital Signs BP (!) 122/91 (BP Location: Right Arm)   Pulse 71   Temp (!) 96.5 F (35.8 C)   Resp 18   Ht 5\' 3"  (1.6 m)   Wt 98.2 kg   SpO2 97%   BMI 38.35 kg/m  Physical Exam Constitutional:      Appearance: Normal appearance.  HENT:     Head: Normocephalic.     Nose: Nose normal.  Eyes:     Conjunctiva/sclera: Conjunctivae normal.  Cardiovascular:     Pulses:          Dorsalis pedis pulses are 2+ on the right side and 2+ on the left side.  Pulmonary:     Effort: Pulmonary effort is normal.  Musculoskeletal:     Right wrist: Normal.  Left wrist: Normal.     Right ankle: Normal.     Left ankle: Swelling present. Tenderness present. Decreased range of motion.     Comments: Sensation and strength maintained in both feet  Neurological:     Mental Status: She is alert.  Psychiatric:        Mood and Affect: Mood normal.     ED Results / Procedures / Treatments   Labs (all labs ordered are listed, but only abnormal results are displayed) Labs Reviewed - No data to display  EKG None  Radiology DG Ankle Complete Left  Result Date: 10/09/2021 CLINICAL DATA:  Patient fell down stairs last night with swelling to lateral and anterior ankle. Unable to bear weight. EXAM: LEFT ANKLE COMPLETE - 3+ VIEW COMPARISON:  None FINDINGS: There is mild diffuse soft tissue edema. No sign of acute fracture or dislocation. No significant arthropathy. IMPRESSION:  Soft tissue edema.  No acute osseous findings. Electronically Signed   By: Signa Kell M.D.   On: 10/09/2021 13:19   DG Foot Complete Left  Result Date: 10/09/2021 CLINICAL DATA:  Left foot pain, injury EXAM: LEFT FOOT - COMPLETE 3+ VIEW COMPARISON:  None Available. FINDINGS: There is no evidence of fracture or dislocation. There is no evidence of arthropathy or other focal bone abnormality. Soft tissues are unremarkable. IMPRESSION: Negative. Electronically Signed   By: Duanne Guess D.O.   On: 10/09/2021 13:17    Procedures Procedures    Medications Ordered in ED Medications - No data to display  ED Course/ Medical Decision Making/ A&P                           Medical Decision Making  Presented for ankle injury related to fall.  Inspection of the left ankle revealed significant swelling decreased range of motion and inability to bear weight on gait assessment.  Both ankles were neurovascularly intact.  I ordered and personally reviewed x-rays which revealed soft tissue edema around the left ankle but no evidence of dislocation or fracture.  I did evaluate her wrist they appeared normal with no swelling, normal range of motion and neurovascularly intact.  Recommended RICE and ibuprofen Tylenol as needed for pain.  Also advised her to follow-up with her PCP for reassessment of swelling and progression of healing of ankle.  Symptoms are likely associated with sprain or ligamentous injury.  Advised if swelling worsens in the next week she may need to see orthopedist.        Final Clinical Impression(s) / ED Diagnoses Final diagnoses:  None    Rx / DC Orders ED Discharge Orders     None         Gareth Eagle, PA-C 10/09/21 1637    Gwyneth Sprout, MD 10/11/21 1515

## 2021-10-09 NOTE — Discharge Instructions (Addendum)
Diagnosed with ankle sprain.  Recommend that you continue rest, ice, compression, elevation of that left ankle.  Recommend Tylenol and ibuprofen as needed for pain.  Also strongly recommend that you follow-up with your PCP regarding reevaluation of your ankle and healing sometime next week.  Reevaluation PCP might refer you to orthopedist if indicated at that time.

## 2021-10-09 NOTE — ED Notes (Signed)
Pt requesting crutches and ankle brace

## 2021-10-09 NOTE — ED Triage Notes (Signed)
Patient here POV from Home.  Endorses Falling Down Stairs Last PM at 2130. Tripped down a Few Steps and injured her Left Foot/Ankle. Swelling to Same.  No LOC. No Head Injury. No Anticoagulants.  NAD Noted during Triage. A&Ox4. GCS 15. BIB Wheelchair.

## 2021-10-25 ENCOUNTER — Other Ambulatory Visit (HOSPITAL_BASED_OUTPATIENT_CLINIC_OR_DEPARTMENT_OTHER): Payer: Self-pay

## 2021-10-26 ENCOUNTER — Other Ambulatory Visit (HOSPITAL_BASED_OUTPATIENT_CLINIC_OR_DEPARTMENT_OTHER): Payer: Self-pay

## 2021-12-16 ENCOUNTER — Other Ambulatory Visit (HOSPITAL_COMMUNITY): Payer: Self-pay

## 2022-02-03 ENCOUNTER — Inpatient Hospital Stay: Payer: Medicare Other

## 2022-02-03 ENCOUNTER — Inpatient Hospital Stay (HOSPITAL_BASED_OUTPATIENT_CLINIC_OR_DEPARTMENT_OTHER): Payer: Medicare Other | Admitting: Hematology and Oncology

## 2022-02-03 ENCOUNTER — Other Ambulatory Visit: Payer: Self-pay

## 2022-02-03 ENCOUNTER — Encounter: Payer: Self-pay | Admitting: Hematology and Oncology

## 2022-02-03 ENCOUNTER — Inpatient Hospital Stay: Payer: Medicare Other | Attending: Hematology and Oncology

## 2022-02-03 VITALS — BP 135/95 | HR 64 | Temp 97.7°F | Resp 18 | Ht 63.0 in | Wt 221.5 lb

## 2022-02-03 DIAGNOSIS — D508 Other iron deficiency anemias: Secondary | ICD-10-CM

## 2022-02-03 DIAGNOSIS — D509 Iron deficiency anemia, unspecified: Secondary | ICD-10-CM

## 2022-02-03 DIAGNOSIS — E538 Deficiency of other specified B group vitamins: Secondary | ICD-10-CM

## 2022-02-03 DIAGNOSIS — E539 Vitamin B deficiency, unspecified: Secondary | ICD-10-CM

## 2022-02-03 DIAGNOSIS — G63 Polyneuropathy in diseases classified elsewhere: Secondary | ICD-10-CM | POA: Diagnosis not present

## 2022-02-03 LAB — IRON AND IRON BINDING CAPACITY (CC-WL,HP ONLY)
Iron: 72 ug/dL (ref 28–170)
Saturation Ratios: 22 % (ref 10.4–31.8)
TIBC: 330 ug/dL (ref 250–450)
UIBC: 258 ug/dL (ref 148–442)

## 2022-02-03 LAB — COMPREHENSIVE METABOLIC PANEL
ALT: 14 U/L (ref 0–44)
ALT: 16 U/L (ref 0–44)
AST: 14 U/L — ABNORMAL LOW (ref 15–41)
AST: 15 U/L (ref 15–41)
Albumin: 3.9 g/dL (ref 3.5–5.0)
Albumin: 4.1 g/dL (ref 3.5–5.0)
Alkaline Phosphatase: 38 U/L (ref 38–126)
Alkaline Phosphatase: 39 U/L (ref 38–126)
Anion gap: 4 — ABNORMAL LOW (ref 5–15)
Anion gap: 7 (ref 5–15)
BUN: 10 mg/dL (ref 6–20)
BUN: 10 mg/dL (ref 6–20)
CO2: 26 mmol/L (ref 22–32)
CO2: 25 mmol/L (ref 22–32)
Calcium: 9.2 mg/dL (ref 8.9–10.3)
Calcium: 9.3 mg/dL (ref 8.9–10.3)
Chloride: 108 mmol/L (ref 98–111)
Chloride: 108 mmol/L (ref 98–111)
Creatinine, Ser: 0.87 mg/dL (ref 0.44–1.00)
Creatinine, Ser: 0.81 mg/dL (ref 0.44–1.00)
GFR, Estimated: >60 mL/min (ref >60)
GFR, Estimated: >60 mL/min (ref >60)
Glucose, Bld: 89 mg/dL (ref 70–99)
Glucose, Bld: 85 mg/dL (ref 70–99)
Potassium: 4.1 mmol/L (ref 3.5–5.1)
Potassium: 4 mmol/L (ref 3.5–5.1)
Sodium: 138 mmol/L (ref 135–145)
Sodium: 140 mmol/L (ref 135–145)
Total Bilirubin: 0.4 mg/dL (ref 0.3–1.2)
Total Bilirubin: 0.5 mg/dL (ref 0.3–1.2)
Total Protein: 6.6 g/dL (ref 6.5–8.1)
Total Protein: 6.8 g/dL (ref 6.5–8.1)

## 2022-02-03 LAB — CBC WITH DIFFERENTIAL/PLATELET
Abs Immature Granulocytes: 0 10*3/uL (ref 0.00–0.07)
Basophils Absolute: 0 10*3/uL (ref 0.0–0.1)
Basophils Relative: 1 %
Eosinophils Absolute: 0.2 10*3/uL (ref 0.0–0.5)
Eosinophils Relative: 5 %
HCT: 39.4 % (ref 36.0–46.0)
Hemoglobin: 14.1 g/dL (ref 12.0–15.0)
Immature Granulocytes: 0 %
Lymphocytes Relative: 39 %
Lymphs Abs: 2.1 10*3/uL (ref 0.7–4.0)
MCH: 32.3 pg (ref 26.0–34.0)
MCHC: 35.8 g/dL (ref 30.0–36.0)
MCV: 90.2 fL (ref 80.0–100.0)
Monocytes Absolute: 0.4 10*3/uL (ref 0.1–1.0)
Monocytes Relative: 7 %
Neutro Abs: 2.6 10*3/uL (ref 1.7–7.7)
Neutrophils Relative %: 48 %
Platelets: 215 10*3/uL (ref 150–400)
RBC: 4.37 MIL/uL (ref 3.87–5.11)
RDW: 11.9 % (ref 11.5–15.5)
WBC: 5.4 10*3/uL (ref 4.0–10.5)
nRBC: 0 % (ref 0.0–0.2)

## 2022-02-03 LAB — FERRITIN: Ferritin: 50 ng/mL (ref 11–307)

## 2022-02-03 LAB — VITAMIN B12: Vitamin B-12: 138 pg/mL — ABNORMAL LOW (ref 180–914)

## 2022-02-03 NOTE — Progress Notes (Signed)
[facility]  Telephone:(336) [phone] [phone]    ID: Arne Cleveland OB: 18-May-1985  MR#: [id number]  ONG#:[id number]  Patient Care Team: Orma Render, NP as PCP - General (Nurse Practitioner) Magrinat, Virgie Dad, MD (Inactive) as Consulting Physician (Oncology) OTHER MD:  CHIEF COMPLAINT: vit B12 anemia, subacute combined degeneration, iron deficiency  CURRENT THERAPY:  vit B12 supplementation, Feraheme as needed   INTERVAL HISTORY:  Jill Summers returns today for follow-up and treatment of her vitamin B12 anemia, subacute combined degeneration, and iron deficiency.  She said she has been falling and stopped taking B12 about 5 months ago. She is not taking iron injections as well. Besides fall, she has noticed some weight gain. No other symptoms. Menstrual cycles are regular and normal Rest of the pertinent 10 point ROS reviewed and negative.  REVIEW OF SYSTEMS:  A detailed review of systems today was otherwise stable   HISTORY OF PRESENT ILLNESS: From the earlier summary:  Jill Summers is a right handed female with history of depression and bronchial asthma as well as recent cellulitis of the umbilicus and treated with Bactrim in September 2015 as well as recent MRSA PCR screen +2 months ago as well as previously untreated B-12 deficiency.  Patient lives with children independent prior to admission and her mother has been assisting with care of the children recently. Admitted 12/16/2013 with bouts of dizziness over the past 2 weeks as well as lower strandy weakness and numbness. She denied any falls. No change in bowel or bladder. Cranial CT scan negative. MRI of thoracic lumbar and cervical spine showed multiple bilateral thoracic cord lesions in the lateral white matter most compatible with demyelinating disease of question etiology and workup ongoing as per neurology services  and felt cord lesions most likely manifestations of B-12 deficiency. Partial  visualization of lower cervical cord shows white matter lesions with full cervical spine films pending. MRI of the brain 12/18/2013 showed less than 10 nonspecific subcentimeter supratentorial white matter T2 hyperintensities in a atypical distribution for classic demyelination. Found to have severe pancytopenia with WBC 1.4, ANC 0.4, hemoglobin 5.5, MCV 101.9, platelets 37,000. Hematology service consulted (Dr. Jana Hakim) She was transfused. Etiology is secondary to B-12 deficiency and currently maintained on supplementation with plan to follow-up with hematology services mostly for B-12 injections    PAST MEDICAL HISTORY: Past Medical History:  Diagnosis Date   Adopted    Asthma    since baby   Bacterial vaginosis    Chlamydia    Depression    At age 53   MRSA infection    Obesity    Urinary tract infection     PAST SURGICAL HISTORY: Past Surgical History:  Procedure Laterality Date   CESAREAN SECTION     CESAREAN SECTION WITH BILATERAL TUBAL LIGATION      FAMILY HISTORY Family History  Adopted: Yes  Mother - hypertension, Father - diabetes   GYNECOLOGIC HISTORY:  Patient has regular menstrual periods. She is status post bilateral tubal ligation. 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  Formerly she cleaned houses.  She lives with 36 y/o son Jill Summers 01/2010). Mother Jill Summers lives next door--she is a retired Hydrologist SW-- with patient's 83 y/o child, Jill Summers.  ADVANCED DIRECTIVES: not in place   HEALTH MAINTENANCE: Social History   Tobacco Use   Smoking status: Some  Days    Types: Cigars    Passive exposure: Never   Smokeless tobacco: Never  Vaping Use   Vaping Use: Never used  Substance Use Topics   Alcohol use: No    Alcohol/week: 2.0 standard drinks of alcohol    Types: 2 Standard drinks or equivalent per week   Drug use: Yes    Types:  Marijuana    Comment: Rolled in tobacco    Allergies  Allergen Reactions   Peanut-Containing Drug Products Anaphylaxis, Hives and Swelling    *Throat swelling*   Shellfish Allergy Anaphylaxis, Hives and Swelling    Throat swelling   Aleve [Naproxen]     Current Outpatient Medications  Medication Sig Dispense Refill   acetaminophen (TYLENOL 8 HOUR) 650 MG CR tablet Take 1 tablet (650 mg total) by mouth every 8 (eight) hours as needed. 100 tablet 3   albuterol (PROVENTIL) (2.5 MG/3ML) 0.083% nebulizer solution Take 3 mLs (2.5 mg total) by nebulization every 4 (four) hours as needed for wheezing or shortness of breath. 75 mL 12   albuterol (VENTOLIN HFA) 108 (90 Base) MCG/ACT inhaler Inhale 1-2 puffs into the lungs every 6 (six) hours as needed for wheezing or shortness of breath 6.7 g 11   buPROPion (WELLBUTRIN XL) 150 MG 24 hr tablet Take 1 tablet (150 mg total) by mouth every morning. 90 tablet 3   cyanocobalamin (,VITAMIN B-12,) 1000 MCG/ML injection Inject 1 mL (1,000 mcg total) into the muscle every 30 (thirty) days. 1 mL 11   Fluticasone-Umeclidin-Vilant (TRELEGY ELLIPTA) 200-62.5-25 MCG/ACT AEPB Use 1 inhalation daily for asthma. 60 each 11   hydrOXYzine (ATARAX) 25 MG tablet Take 1-2 tablets (25-50 mg total) by mouth every 6 (six) hours as needed for anxiety. 60 tablet 11   loratadine (CLARITIN) 10 MG tablet Take 1 tablet (10 mg total) by mouth daily. 90 tablet 11   predniSONE (DELTASONE) 10 MG tablet 12 day taper PO at first sign of exacerbation 48 tablet 2   Vitamin D, Ergocalciferol, (DRISDOL) 1.25 MG (50000 UNIT) CAPS capsule Take 1 capsule (50,000 Units total) by mouth every 7 (seven) days. 14 capsule 0   No current facility-administered medications for this visit.    OBJECTIVE: African-American woman in no acute distress  Vitals:   02/03/22 0911  BP: (!) 135/95  Pulse: 64  Resp: 18  Temp: 97.7 F (36.5 C)  SpO2: 100%    Wt Readings from Last 3 Encounters:   02/03/22 221 lb 8 oz (100.5 kg)  10/09/21 216 lb 7.9 oz (98.2 kg)  07/14/21 216 lb 9.6 oz (98.2 kg)   Body mass index is 39.24 kg/m.    ECOG FS:1 - Symptomatic but completely ambulatory  Physical Exam Constitutional:      Appearance: Normal appearance.  HENT:     Head: Normocephalic and atraumatic.  Cardiovascular:     Rate and Rhythm: Normal rate and regular rhythm.  Pulmonary:     Breath sounds: Wheezing present.  Musculoskeletal:        General: Normal range of motion.  Skin:    General: Skin is warm and dry.  Neurological:     General: No focal deficit present.     Mental Status: She is alert.     Sensory: No sensory deficit.     Motor: No weakness.     Coordination: Coordination normal.     Gait: Gait normal.     Comments: Proprioception is normal in lower extremities     LAB  RESULTS:  CBC    Component Value Date/Time   WBC 5.4 02/03/2022 0829   RBC 4.37 02/03/2022 0829   HGB 14.1 02/03/2022 0829   HGB 12.5 07/14/2021 1627   HGB 12.3 01/20/2016 1404   HCT 39.4 02/03/2022 0829   HCT 37.7 07/14/2021 1627   HCT 37.1 01/20/2016 1404   PLT 215 02/03/2022 0829   PLT 246 07/14/2021 1627   MCV 90.2 02/03/2022 0829   MCV 82 07/14/2021 1627   MCV 85.5 01/20/2016 1404   MCH 32.3 02/03/2022 0829   MCHC 35.8 02/03/2022 0829   RDW 11.9 02/03/2022 0829   RDW 22.1 (H) 07/14/2021 1627   RDW 14.1 01/20/2016 1404   LYMPHSABS 2.1 02/03/2022 0829   LYMPHSABS 2.4 07/14/2021 1627   LYMPHSABS 2.8 01/20/2016 1404   MONOABS 0.4 02/03/2022 0829   MONOABS 0.2 01/20/2016 1404   EOSABS 0.2 02/03/2022 0829   EOSABS 0.2 07/14/2021 1627   BASOSABS 0.0 02/03/2022 0829   BASOSABS 0.0 07/14/2021 1627   BASOSABS 0.0 01/20/2016 1404   CMP     Component Value Date/Time   NA 138 02/03/2022 0829   NA 139 07/14/2021 1627   NA 140 02/04/2015 1049   K 4.1 02/03/2022 0829   K 3.9 02/04/2015 1049   CL 108 02/03/2022 0829   CO2 26 02/03/2022 0829   CO2 23 02/04/2015 1049    GLUCOSE 89 02/03/2022 0829   GLUCOSE 98 02/04/2015 1049   BUN 10 02/03/2022 0829   BUN 9 07/14/2021 1627   BUN 9.8 02/04/2015 1049   CREATININE 0.87 02/03/2022 0829   CREATININE 0.85 01/29/2018 0831   CREATININE 0.9 02/04/2015 1049   CALCIUM 9.2 02/03/2022 0829   CALCIUM 8.8 02/04/2015 1049   PROT 6.6 02/03/2022 0829   PROT 6.8 07/14/2021 1627   PROT 6.9 02/04/2015 1049   ALBUMIN 3.9 02/03/2022 0829   ALBUMIN 4.0 07/14/2021 1627   ALBUMIN 3.4 (L) 02/04/2015 1049   AST 14 (L) 02/03/2022 0829   AST 12 (L) 01/29/2018 0831   AST 13 02/04/2015 1049   ALT 14 02/03/2022 0829   ALT 9 01/29/2018 0831   ALT <9 02/04/2015 1049   ALKPHOS 38 02/03/2022 0829   ALKPHOS 53 02/04/2015 1049   BILITOT 0.4 02/03/2022 0829   BILITOT 0.3 07/14/2021 1627   BILITOT 0.6 01/29/2018 0831   BILITOT 0.47 02/04/2015 1049   GFRNONAA >60 02/03/2022 0829   GFRNONAA >60 01/29/2018 0831   GFRAA >60 11/05/2018 1036   GFRAA >60 01/29/2018 0831        Component Value Date/Time   COLORURINE AMBER (A) 12/16/2013 0707   APPEARANCEUR HAZY (A) 12/16/2013 0707   LABSPEC 1.025 12/16/2013 0707   PHURINE 5.0 12/16/2013 0707   GLUCOSEU NEGATIVE 12/16/2013 0707   HGBUR LARGE (A) 12/16/2013 0707   BILIRUBINUR SMALL (A) 12/16/2013 0707   KETONESUR NEGATIVE 12/16/2013 0707   PROTEINUR NEGATIVE 12/16/2013 0707   UROBILINOGEN >8.0 (H) 12/16/2013 0707   NITRITE NEGATIVE 12/16/2013 0707   LEUKOCYTESUR SMALL (A) 12/16/2013 0707   Iron/TIBC/Ferritin/ %Sat    Component Value Date/Time   IRON 132 07/14/2021 1627   TIBC 263 07/14/2021 1627   FERRITIN 338 (H) 07/14/2021 1627   FERRITIN 9 12/09/2015 1037   IRONPCTSAT 50 07/14/2021 1627    STUDIES: No results found.   ASSESSMENT: 37 y.o. [place] woman presenting 11/15/2013 with inability to walk, fever and pancytopenia, found to be severely B-12 deficient  (1) B-12 deficiency: will need lifelong replacement; as patient  does not have PCP this can be done through  our office             (a) received montlhy  (2) subacute combined degeneration - Stable  (3) iron deficiency: Last ferritin level in February 2023 of 4  PLAN:   Ms. Makiah is here for follow-up.   Since last visit, she has ongoing asthma exacerbation, no worsening neuropathy or subacute combined degeneration symptoms.  She occasionally still stumbles and has some balance issues.  No new health issues. Physical examination today unremarkable. Given last ferritin of 4, I have recommended that she proceed with Injectafer.  She will have the scheduled on her way out today. She will continue B12 injection every 4 weeks. Return to clinic in 3 months with repeat labs.  She was encouraged to call her PCP about her ongoing asthma exacerbation if she does not see relief by the time she completes her steroid course. She expressed understanding.  Total time spent: 30 minutes   *Total Encounter Time as defined by the Centers for Medicare and Medicaid Services includes, in addition to the face-to-face time of a patient visit (documented in the note above) non-face-to-face time: obtaining and reviewing outside history, ordering and reviewing medications, tests or procedures, care coordination (communications with other health care professionals or caregivers) and documentation in the medical record.  Rachel Moulds MD

## 2022-02-03 NOTE — Progress Notes (Signed)
[facility]  Telephone:(336) [phone] Fax:(336) [phone]    ID: Arne Cleveland OB: Mar 21, 1985  MR#: [id number]  UXN#:[id number]  Patient Care Team: Jill Render, NP as PCP - General (Nurse Practitioner) Magrinat, Jill Dad, MD (Inactive) as Consulting Physician (Oncology) OTHER MD:  CHIEF COMPLAINT: vit B12 anemia, subacute combined degeneration, iron deficiency  CURRENT THERAPY:  vit B12 supplementation, Feraheme as needed   INTERVAL HISTORY:  Jill Summers returns today for follow-up and treatment of her vitamin B12 anemia, subacute combined degeneration, and iron deficiency.  She continues to have regular menstrual cycles.  She does not describe them as heavy. She has been getting her B12 injection every 4 weeks.  No recent oral or IV iron supplementation. Rest of the pertinent 10 point ROS reviewed and negative.  REVIEW OF SYSTEMS:  A detailed review of systems today was otherwise stable   HISTORY OF PRESENT ILLNESS: From the earlier summary:  Jill Summers is a right handed female with history of depression and bronchial asthma as well as recent cellulitis of the umbilicus and treated with Bactrim in September 2015 as well as recent MRSA PCR screen +2 months ago as well as previously untreated B-12 deficiency.  Patient lives with children independent prior to admission and her mother has been assisting with care of the children recently. Admitted 12/16/2013 with bouts of dizziness over the past 2 weeks as well as lower strandy weakness and numbness. She denied any falls. No change in bowel or bladder. Cranial CT scan negative. MRI of thoracic lumbar and cervical spine showed multiple bilateral thoracic cord lesions in the lateral white matter most compatible with demyelinating disease of question etiology and workup ongoing as per neurology services  and felt cord lesions most likely manifestations of B-12 deficiency. Partial visualization of lower cervical cord shows  white matter lesions with full cervical spine films pending. MRI of the brain 12/18/2013 showed less than 10 nonspecific subcentimeter supratentorial white matter T2 hyperintensities in a atypical distribution for classic demyelination. Found to have severe pancytopenia with WBC 1.4, ANC 0.4, hemoglobin 5.5, MCV 101.9, platelets 37,000. Hematology service consulted (Dr. Jana Summers) She was transfused. Etiology is secondary to B-12 deficiency and currently maintained on supplementation with plan to follow-up with hematology services mostly for B-12 injections    PAST MEDICAL HISTORY: Past Medical History:  Diagnosis Date   Adopted    Asthma    since baby   Bacterial vaginosis    Chlamydia    Depression    At age 36   MRSA infection    Obesity    Urinary tract infection     PAST SURGICAL HISTORY: Past Surgical History:  Procedure Laterality Date   CESAREAN SECTION     CESAREAN SECTION WITH BILATERAL TUBAL LIGATION      FAMILY HISTORY Family History  Adopted: Yes  Mother - hypertension, Father - diabetes   GYNECOLOGIC HISTORY:  Patient has regular menstrual periods. She is status post bilateral tubal ligation. 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  She lives with 7 y/o son Jill Summers 01/2010). Mother Jill Summers lives next door--she is a retired Hydrologist SW-- with patient's 74 y/o child, Jill Summers.  ADVANCED DIRECTIVES: not in place   HEALTH MAINTENANCE: Social History   Tobacco Use   Smoking status: Some Days    Types: Cigars  Passive exposure: Never   Smokeless tobacco: Never  Vaping Use   Vaping Use: Never used  Substance Use Topics   Alcohol use: No    Alcohol/week: 2.0 standard drinks of alcohol    Types: 2 Standard drinks or equivalent per week   Drug use: Yes    Types: Marijuana    Comment: Rolled in tobacco     Allergies  Allergen Reactions   Peanut-Containing Drug Products Anaphylaxis, Hives and Swelling    *Throat swelling*   Shellfish Allergy Anaphylaxis, Hives and Swelling    Throat swelling   Aleve [Naproxen]     Current Outpatient Medications  Medication Sig Dispense Refill   acetaminophen (TYLENOL 8 HOUR) 650 MG CR tablet Take 1 tablet (650 mg total) by mouth every 8 (eight) hours as needed. 100 tablet 3   albuterol (PROVENTIL) (2.5 MG/3ML) 0.083% nebulizer solution Take 3 mLs (2.5 mg total) by nebulization every 4 (four) hours as needed for wheezing or shortness of breath. 75 mL 12   albuterol (VENTOLIN HFA) 108 (90 Base) MCG/ACT inhaler Inhale 1-2 puffs into the lungs every 6 (six) hours as needed for wheezing or shortness of breath 6.7 g 11   buPROPion (WELLBUTRIN XL) 150 MG 24 hr tablet Take 1 tablet (150 mg total) by mouth every morning. 90 tablet 3   cyanocobalamin (,VITAMIN B-12,) 1000 MCG/ML injection Inject 1 mL (1,000 mcg total) into the muscle every 30 (thirty) days. 1 mL 11   Fluticasone-Umeclidin-Vilant (TRELEGY ELLIPTA) 200-62.5-25 MCG/ACT AEPB Use 1 inhalation daily for asthma. 60 each 11   hydrOXYzine (ATARAX) 25 MG tablet Take 1-2 tablets (25-50 mg total) by mouth every 6 (six) hours as needed for anxiety. 60 tablet 11   loratadine (CLARITIN) 10 MG tablet Take 1 tablet (10 mg total) by mouth daily. 90 tablet 11   predniSONE (DELTASONE) 10 MG tablet 12 day taper PO at first sign of exacerbation 48 tablet 2   Vitamin D, Ergocalciferol, (DRISDOL) 1.25 MG (50000 UNIT) CAPS capsule Take 1 capsule (50,000 Units total) by mouth every 7 (seven) days. 14 capsule 0   No current facility-administered medications for this visit.    OBJECTIVE: African-American woman in no acute distress  Vitals:   02/03/22 0911  BP: (!) 135/95  Pulse: 64  Resp: 18  Temp: 97.7 F (36.5 C)  SpO2: 100%    Wt Readings from Last 3 Encounters:  02/03/22 221 lb 8 oz (100.5 kg)  10/09/21 216  lb 7.9 oz (98.2 kg)  07/14/21 216 lb 9.6 oz (98.2 kg)   Body mass index is 39.24 kg/m.    ECOG FS:1 - Symptomatic but completely ambulatory  Physical Exam Constitutional:      Appearance: Normal appearance.  HENT:     Head: Normocephalic and atraumatic.  Cardiovascular:     Rate and Rhythm: Normal rate and regular rhythm.  Pulmonary:     Breath sounds: No wheezing.  Musculoskeletal:        General: Normal range of motion.  Skin:    General: Skin is warm and dry.  Neurological:     General: No focal deficit present.     Mental Status: She is alert.     Sensory: No sensory deficit.     Motor: No weakness.     Coordination: Coordination normal.     Gait: Gait normal.     Comments: Proprioception is normal in lower extremities     LAB RESULTS:  CBC    Component Value Date/Time  WBC 5.4 02/03/2022 0829   RBC 4.37 02/03/2022 0829   HGB 14.1 02/03/2022 0829   HGB 12.5 07/14/2021 1627   HGB 12.3 01/20/2016 1404   HCT 39.4 02/03/2022 0829   HCT 37.7 07/14/2021 1627   HCT 37.1 01/20/2016 1404   PLT 215 02/03/2022 0829   PLT 246 07/14/2021 1627   MCV 90.2 02/03/2022 0829   MCV 82 07/14/2021 1627   MCV 85.5 01/20/2016 1404   MCH 32.3 02/03/2022 0829   MCHC 35.8 02/03/2022 0829   RDW 11.9 02/03/2022 0829   RDW 22.1 (H) 07/14/2021 1627   RDW 14.1 01/20/2016 1404   LYMPHSABS 2.1 02/03/2022 0829   LYMPHSABS 2.4 07/14/2021 1627   LYMPHSABS 2.8 01/20/2016 1404   MONOABS 0.4 02/03/2022 0829   MONOABS 0.2 01/20/2016 1404   EOSABS 0.2 02/03/2022 0829   EOSABS 0.2 07/14/2021 1627   BASOSABS 0.0 02/03/2022 0829   BASOSABS 0.0 07/14/2021 1627   BASOSABS 0.0 01/20/2016 1404   CMP     Component Value Date/Time   NA 138 02/03/2022 0829   NA 139 07/14/2021 1627   NA 140 02/04/2015 1049   K 4.1 02/03/2022 0829   K 3.9 02/04/2015 1049   CL 108 02/03/2022 0829   CO2 26 02/03/2022 0829   CO2 23 02/04/2015 1049   GLUCOSE 89 02/03/2022 0829   GLUCOSE 98 02/04/2015 1049    BUN 10 02/03/2022 0829   BUN 9 07/14/2021 1627   BUN 9.8 02/04/2015 1049   CREATININE 0.87 02/03/2022 0829   CREATININE 0.85 01/29/2018 0831   CREATININE 0.9 02/04/2015 1049   CALCIUM 9.2 02/03/2022 0829   CALCIUM 8.8 02/04/2015 1049   PROT 6.6 02/03/2022 0829   PROT 6.8 07/14/2021 1627   PROT 6.9 02/04/2015 1049   ALBUMIN 3.9 02/03/2022 0829   ALBUMIN 4.0 07/14/2021 1627   ALBUMIN 3.4 (L) 02/04/2015 1049   AST 14 (L) 02/03/2022 0829   AST 12 (L) 01/29/2018 0831   AST 13 02/04/2015 1049   ALT 14 02/03/2022 0829   ALT 9 01/29/2018 0831   ALT <9 02/04/2015 1049   ALKPHOS 38 02/03/2022 0829   ALKPHOS 53 02/04/2015 1049   BILITOT 0.4 02/03/2022 0829   BILITOT 0.3 07/14/2021 1627   BILITOT 0.6 01/29/2018 0831   BILITOT 0.47 02/04/2015 1049   GFRNONAA >60 02/03/2022 0829   GFRNONAA >60 01/29/2018 0831   GFRAA >60 11/05/2018 1036   GFRAA >60 01/29/2018 0831        Component Value Date/Time   COLORURINE AMBER (A) 12/16/2013 0707   APPEARANCEUR HAZY (A) 12/16/2013 0707   LABSPEC 1.025 12/16/2013 0707   PHURINE 5.0 12/16/2013 0707   GLUCOSEU NEGATIVE 12/16/2013 0707   HGBUR LARGE (A) 12/16/2013 0707   BILIRUBINUR SMALL (A) 12/16/2013 0707   KETONESUR NEGATIVE 12/16/2013 0707   PROTEINUR NEGATIVE 12/16/2013 0707   UROBILINOGEN >8.0 (H) 12/16/2013 0707   NITRITE NEGATIVE 12/16/2013 0707   LEUKOCYTESUR SMALL (A) 12/16/2013 0707   Iron/TIBC/Ferritin/ %Sat    Component Value Date/Time   IRON 132 07/14/2021 1627   TIBC 263 07/14/2021 1627   FERRITIN 338 (H) 07/14/2021 1627   FERRITIN 9 12/09/2015 1037   IRONPCTSAT 50 07/14/2021 1627    STUDIES: No results found.   ASSESSMENT: 37 y.o. [place] woman presenting 11/15/2013 with inability to walk, fever and pancytopenia, found to be severely B-12 deficient  (1) B-12 deficiency: will need lifelong replacement; as patient does not have PCP this can be done through our office             (  a) received montlhy  (2) subacute  combined degeneration - Stable  (3) iron deficiency: Last ferritin level in February 2023 of 4  PLAN:   Jill Summers is here for follow-up.   Since last visit, she had a fall.  She occasionally has been still stumbling and by has balance issues.  She has not been taking her B12 injections for almost 5 months or so.  She was also getting iron infusions here in the past but has not scheduled these either.  Labs today shows normal hemoglobin but no iron labs every 12 labs available yet.   Once we have her labs, we will reschedule her for B12 injections and iron infusions as needed. Return to clinic in 8 weeks with repeat labs.    Total time spent: 30 minutes   *Total Encounter Time as defined by the Centers for Medicare and Medicaid Services includes, in addition to the face-to-face time of a patient visit (documented in the note above) non-face-to-face time: obtaining and reviewing outside history, ordering and reviewing medications, tests or procedures, care coordination (communications with other health care professionals or caregivers) and documentation in the medical record.  Jill Pike MD

## 2022-02-06 ENCOUNTER — Other Ambulatory Visit: Payer: Self-pay | Admitting: Hematology and Oncology

## 2022-02-06 NOTE — Progress Notes (Unsigned)
She needs to restart B12 supplementation. Ferritin is normal today so no indication for iron infusion. Message sent to scheduling to have B12 shots scheduled.  Jill Summers

## 2022-02-07 LAB — FOLATE RBC
Folate, Hemolysate: 306 ng/mL
Folate, RBC: 727 ng/mL (ref >498)
Hematocrit: 42.1 % (ref 34.0–46.6)

## 2022-02-08 ENCOUNTER — Other Ambulatory Visit: Payer: Self-pay

## 2022-02-08 ENCOUNTER — Inpatient Hospital Stay: Payer: Medicare Other

## 2022-02-08 VITALS — BP 121/90 | HR 90 | Temp 97.9°F | Resp 18

## 2022-02-08 DIAGNOSIS — E538 Deficiency of other specified B group vitamins: Secondary | ICD-10-CM | POA: Diagnosis not present

## 2022-02-08 DIAGNOSIS — E539 Vitamin B deficiency, unspecified: Secondary | ICD-10-CM

## 2022-02-08 DIAGNOSIS — D61818 Other pancytopenia: Secondary | ICD-10-CM

## 2022-02-08 DIAGNOSIS — G8220 Paraplegia, unspecified: Secondary | ICD-10-CM

## 2022-02-08 DIAGNOSIS — R29898 Other symptoms and signs involving the musculoskeletal system: Secondary | ICD-10-CM

## 2022-02-08 DIAGNOSIS — D509 Iron deficiency anemia, unspecified: Secondary | ICD-10-CM | POA: Diagnosis not present

## 2022-02-08 MED ORDER — CYANOCOBALAMIN 1000 MCG/ML IJ SOLN
1000.0000 ug | Freq: Once | INTRAMUSCULAR | Status: AC
  Administered 2022-02-08: 1000 ug via INTRAMUSCULAR
  Filled 2022-02-08: qty 1

## 2022-02-08 NOTE — Patient Instructions (Signed)

## 2022-02-16 ENCOUNTER — Other Ambulatory Visit (HOSPITAL_COMMUNITY): Payer: Self-pay

## 2022-03-08 ENCOUNTER — Other Ambulatory Visit: Payer: Self-pay

## 2022-03-08 ENCOUNTER — Inpatient Hospital Stay: Payer: Medicare Other | Attending: Hematology and Oncology

## 2022-03-08 DIAGNOSIS — D61818 Other pancytopenia: Secondary | ICD-10-CM

## 2022-03-08 DIAGNOSIS — E538 Deficiency of other specified B group vitamins: Secondary | ICD-10-CM | POA: Diagnosis not present

## 2022-03-08 DIAGNOSIS — E539 Vitamin B deficiency, unspecified: Secondary | ICD-10-CM

## 2022-03-08 DIAGNOSIS — R29898 Other symptoms and signs involving the musculoskeletal system: Secondary | ICD-10-CM

## 2022-03-08 DIAGNOSIS — G8220 Paraplegia, unspecified: Secondary | ICD-10-CM

## 2022-03-08 MED ORDER — CYANOCOBALAMIN 1000 MCG/ML IJ SOLN
1000.0000 ug | Freq: Once | INTRAMUSCULAR | Status: AC
  Administered 2022-03-08: 1000 ug via INTRAMUSCULAR
  Filled 2022-03-08: qty 1

## 2022-03-08 NOTE — Patient Instructions (Signed)
Vitamin B12 Deficiency Vitamin B12 deficiency occurs when the body does not have enough of this important vitamin. The body needs this vitamin: To make red blood cells. To make DNA. This is the genetic material inside cells. To help the nerves work properly so they can carry messages from the brain to the body. Vitamin B12 deficiency can cause health problems, such as not having enough red blood cells in the blood (anemia). This can lead to nerve damage if untreated. What are the causes? This condition may be caused by: Not eating enough foods that contain vitamin B12. Not having enough stomach acid and digestive fluids to properly absorb vitamin B12 from the food that you eat. Having certain diseases that make it hard to absorb vitamin B12. These diseases include Crohn's disease, chronic pancreatitis, and cystic fibrosis. An autoimmune disorder in which the body does not make enough of a protein (intrinsic factor) within the stomach, resulting in not enough absorption of vitamin B12. Having a surgery in which part of the stomach or small intestine is removed. Taking certain medicines that make it hard for the body to absorb vitamin B12. These include: Heartburn medicines, such as antacids and proton pump inhibitors. Some medicines that are used to treat diabetes. What increases the risk? The following factors may make you more likely to develop a vitamin B12 deficiency: Being an older adult. Eating a vegetarian or vegan diet that does not include any foods that come from animals. Eating a poor diet while you are pregnant. Taking certain medicines. Having alcoholism. What are the signs or symptoms? In some cases, there are no symptoms of this condition. If the condition leads to anemia or nerve damage, various symptoms may occur, such as: Weakness. Tiredness (fatigue). Loss of appetite. Numbness or tingling in your hands and feet. Redness and burning of the tongue. Depression,  confusion, or memory problems. Trouble walking. If anemia is severe, symptoms can include: Shortness of breath. Dizziness. Rapid heart rate. How is this diagnosed? This condition may be diagnosed with a blood test to measure the level of vitamin B12 in your blood. You may also have other tests, including: A group of tests that measure certain characteristics of blood cells (complete blood count, CBC). A blood test to measure intrinsic factor. A procedure where a thin tube with a camera on the end is used to look into your stomach or intestines (endoscopy). Other tests may be needed to discover the cause of the deficiency. How is this treated? Treatment for this condition depends on the cause. This condition may be treated by: Changing your eating and drinking habits, such as: Eating more foods that contain vitamin B12. Drinking less alcohol or no alcohol. Getting vitamin B12 injections. Taking vitamin B12 supplements by mouth (orally). Your health care provider will tell you which dose is best for you. Follow these instructions at home: Eating and drinking  Include foods in your diet that come from animals and contain a lot of vitamin B12. These include: Meats and poultry. This includes beef, pork, chicken, turkey, and organ meats, such as liver. Seafood. This includes clams, rainbow trout, salmon, tuna, and haddock. Eggs. Dairy foods such as milk, yogurt, and cheese. Eat foods that have vitamin B12 added to them (are fortified), such as ready-to-eat breakfast cereals. Check the label on the package to see if a food is fortified. The items listed above may not be a complete list of foods and beverages you can eat and drink. Contact a dietitian for   more information. Alcohol use Do not drink alcohol if: Your health care provider tells you not to drink. You are pregnant, may be pregnant, or are planning to become pregnant. If you drink alcohol: Limit how much you have to: 0-1 drink a  day for women. 0-2 drinks a day for men. Know how much alcohol is in your drink. In the [place] one drink equals one 12 oz bottle of beer (355 mL), one 5 oz glass of wine (148 mL), or one 1 oz glass of hard liquor (44 mL). General instructions Get vitamin B12 injections if told to by your health care provider. Take supplements only as told by your health care provider. Follow the directions carefully. Keep all follow-up visits. This is important. Contact a health care provider if: Your symptoms come back. Your symptoms get worse or do not improve with treatment. Get help right away: You develop shortness of breath. You have a rapid heart rate. You have chest pain. You become dizzy or you faint. These symptoms may be an emergency. Get help right away. Call 911. Do not wait to see if the symptoms will go away. Do not drive yourself to the hospital. Summary Vitamin B12 deficiency occurs when the body does not have enough of this important vitamin. Common causes include not eating enough foods that contain vitamin B12, not being able to absorb vitamin B12 from the food that you eat, having a surgery in which part of the stomach or small intestine is removed, or taking certain medicines. Eat foods that have vitamin B12 in them. Treatment may include making a change in the way you eat and drink, getting vitamin B12 injections, or taking vitamin B12 supplements. This information is not intended to replace advice given to you by your health care provider. Make sure you discuss any questions you have with your health care provider. Document Revised: 09/10/2020 Document Reviewed: 09/10/2020 Elsevier Patient Education  2023 Elsevier Inc.  

## 2022-03-29 ENCOUNTER — Encounter: Payer: Self-pay | Admitting: Oncology

## 2022-04-03 ENCOUNTER — Encounter: Payer: Self-pay | Admitting: Oncology

## 2022-04-03 ENCOUNTER — Other Ambulatory Visit (HOSPITAL_BASED_OUTPATIENT_CLINIC_OR_DEPARTMENT_OTHER): Payer: Self-pay

## 2022-04-03 ENCOUNTER — Other Ambulatory Visit: Payer: Self-pay

## 2022-04-03 DIAGNOSIS — E538 Deficiency of other specified B group vitamins: Secondary | ICD-10-CM

## 2022-04-03 DIAGNOSIS — D61818 Other pancytopenia: Secondary | ICD-10-CM

## 2022-04-03 DIAGNOSIS — D509 Iron deficiency anemia, unspecified: Secondary | ICD-10-CM

## 2022-04-05 ENCOUNTER — Inpatient Hospital Stay: Payer: Medicare Other

## 2022-04-05 ENCOUNTER — Telehealth: Payer: Self-pay

## 2022-04-05 ENCOUNTER — Inpatient Hospital Stay: Payer: Medicare Other | Attending: Hematology and Oncology

## 2022-04-05 ENCOUNTER — Inpatient Hospital Stay: Payer: Medicare Other | Admitting: Adult Health

## 2022-04-05 NOTE — Telephone Encounter (Signed)
Attempted to call pt X2 to inquire about /NS to her appt with Wilber Bihari, NP. Pt did not answer and no VM was available to leave a message.

## 2022-04-13 ENCOUNTER — Encounter: Payer: Self-pay | Admitting: Oncology

## 2022-04-13 ENCOUNTER — Telehealth: Payer: Self-pay | Admitting: Nurse Practitioner

## 2022-04-13 NOTE — Telephone Encounter (Signed)
Contacted Shanekia L Welter to schedule their annual wellness visit. Appointment made for 04/18/22.  Barkley Boards AWV direct phone # [phone]

## 2022-04-18 ENCOUNTER — Ambulatory Visit: Payer: Medicare Other

## 2022-04-25 ENCOUNTER — Other Ambulatory Visit (HOSPITAL_BASED_OUTPATIENT_CLINIC_OR_DEPARTMENT_OTHER): Payer: Self-pay

## 2022-05-02 ENCOUNTER — Ambulatory Visit: Payer: Medicare Other

## 2022-05-02 ENCOUNTER — Telehealth: Payer: Self-pay

## 2022-05-02 NOTE — Telephone Encounter (Signed)
This nurse attempted to call patient three times for AWV. Message left that we will call again to reschedule for another time. 

## 2022-05-26 ENCOUNTER — Other Ambulatory Visit (HOSPITAL_BASED_OUTPATIENT_CLINIC_OR_DEPARTMENT_OTHER): Payer: Self-pay

## 2022-05-26 ENCOUNTER — Encounter: Payer: Self-pay | Admitting: Oncology

## 2022-05-31 ENCOUNTER — Telehealth: Payer: Self-pay | Admitting: Nurse Practitioner

## 2022-05-31 ENCOUNTER — Other Ambulatory Visit (HOSPITAL_BASED_OUTPATIENT_CLINIC_OR_DEPARTMENT_OTHER): Payer: Self-pay

## 2022-05-31 NOTE — Telephone Encounter (Signed)
P.A. ALBUTEROL SULFATE NEBULIZER SOLUTION

## 2022-06-01 ENCOUNTER — Other Ambulatory Visit: Payer: Self-pay

## 2022-06-01 ENCOUNTER — Encounter: Payer: Self-pay | Admitting: Oncology

## 2022-06-01 ENCOUNTER — Other Ambulatory Visit (HOSPITAL_BASED_OUTPATIENT_CLINIC_OR_DEPARTMENT_OTHER): Payer: Self-pay

## 2022-06-01 NOTE — Telephone Encounter (Signed)
P.A. denied, not covered under Part D, must be billed under Part B.  Called [facility] pharmacy & they can not bill under Part B Rx must go to Medical supply store.  Asked cash price $13.75.  Called pt & left message if she wants to pay cash or have it transferred to Medical supply (DME).

## 2022-06-19 ENCOUNTER — Other Ambulatory Visit (HOSPITAL_BASED_OUTPATIENT_CLINIC_OR_DEPARTMENT_OTHER): Payer: Self-pay

## 2022-06-19 ENCOUNTER — Encounter: Payer: Self-pay | Admitting: Oncology

## 2022-06-20 ENCOUNTER — Other Ambulatory Visit (HOSPITAL_BASED_OUTPATIENT_CLINIC_OR_DEPARTMENT_OTHER): Payer: Self-pay

## 2022-06-20 ENCOUNTER — Telehealth: Payer: Self-pay | Admitting: Hematology and Oncology

## 2022-06-20 NOTE — Telephone Encounter (Signed)
Spoke with patient confirming upcoming appointment  

## 2022-07-11 ENCOUNTER — Inpatient Hospital Stay: Payer: Medicare Other

## 2022-07-11 ENCOUNTER — Inpatient Hospital Stay: Payer: Medicare Other | Attending: Hematology and Oncology | Admitting: Hematology and Oncology

## 2022-07-11 ENCOUNTER — Other Ambulatory Visit: Payer: Self-pay

## 2022-07-11 VITALS — BP 139/85 | HR 80 | Temp 97.2°F | Resp 18 | Ht 63.0 in | Wt 219.4 lb

## 2022-07-11 VITALS — BP 125/90 | HR 68 | Resp 16

## 2022-07-11 DIAGNOSIS — E538 Deficiency of other specified B group vitamins: Secondary | ICD-10-CM | POA: Diagnosis not present

## 2022-07-11 DIAGNOSIS — D509 Iron deficiency anemia, unspecified: Secondary | ICD-10-CM | POA: Insufficient documentation

## 2022-07-11 DIAGNOSIS — D61818 Other pancytopenia: Secondary | ICD-10-CM

## 2022-07-11 DIAGNOSIS — G63 Polyneuropathy in diseases classified elsewhere: Secondary | ICD-10-CM

## 2022-07-11 DIAGNOSIS — G8220 Paraplegia, unspecified: Secondary | ICD-10-CM

## 2022-07-11 DIAGNOSIS — R29898 Other symptoms and signs involving the musculoskeletal system: Secondary | ICD-10-CM

## 2022-07-11 LAB — CMP (CANCER CENTER ONLY)
ALT: 14 U/L (ref 0–44)
AST: 17 U/L (ref 15–41)
Albumin: 3.9 g/dL (ref 3.5–5.0)
Alkaline Phosphatase: 34 U/L — ABNORMAL LOW (ref 38–126)
Anion gap: 5 (ref 5–15)
BUN: 11 mg/dL (ref 6–20)
CO2: 27 mmol/L (ref 22–32)
Calcium: 9.2 mg/dL (ref 8.9–10.3)
Chloride: 108 mmol/L (ref 98–111)
Creatinine: 1.12 mg/dL — ABNORMAL HIGH (ref 0.44–1.00)
GFR, Estimated: >60 mL/min (ref >60)
Glucose, Bld: 96 mg/dL (ref 70–99)
Potassium: 3.9 mmol/L (ref 3.5–5.1)
Sodium: 140 mmol/L (ref 135–145)
Total Bilirubin: 0.4 mg/dL (ref 0.3–1.2)
Total Protein: 6.9 g/dL (ref 6.5–8.1)

## 2022-07-11 LAB — CBC WITH DIFFERENTIAL (CANCER CENTER ONLY)
Abs Immature Granulocytes: 0.01 10*3/uL (ref 0.00–0.07)
Basophils Absolute: 0.1 10*3/uL (ref 0.0–0.1)
Basophils Relative: 1 %
Eosinophils Absolute: 0.3 10*3/uL (ref 0.0–0.5)
Eosinophils Relative: 5 %
HCT: 39.8 % (ref 36.0–46.0)
Hemoglobin: 13.4 g/dL (ref 12.0–15.0)
Immature Granulocytes: 0 %
Lymphocytes Relative: 39 %
Lymphs Abs: 2.4 10*3/uL (ref 0.7–4.0)
MCH: 30.9 pg (ref 26.0–34.0)
MCHC: 33.7 g/dL (ref 30.0–36.0)
MCV: 91.9 fL (ref 80.0–100.0)
Monocytes Absolute: 0.4 10*3/uL (ref 0.1–1.0)
Monocytes Relative: 7 %
Neutro Abs: 3 10*3/uL (ref 1.7–7.7)
Neutrophils Relative %: 48 %
Platelet Count: 233 10*3/uL (ref 150–400)
RBC: 4.33 MIL/uL (ref 3.87–5.11)
RDW: 13.2 % (ref 11.5–15.5)
WBC Count: 6.2 10*3/uL (ref 4.0–10.5)
nRBC: 0 % (ref 0.0–0.2)

## 2022-07-11 LAB — IRON AND IRON BINDING CAPACITY (CC-WL,HP ONLY)
Iron: 61 ug/dL (ref 28–170)
Saturation Ratios: 17 % (ref 10.4–31.8)
TIBC: 364 ug/dL (ref 250–450)
UIBC: 303 ug/dL (ref 148–442)

## 2022-07-11 LAB — VITAMIN B12: Vitamin B-12: 135 pg/mL — ABNORMAL LOW (ref 180–914)

## 2022-07-11 LAB — FERRITIN: Ferritin: 10 ng/mL — ABNORMAL LOW (ref 11–307)

## 2022-07-11 MED ORDER — CYANOCOBALAMIN 1000 MCG/ML IJ SOLN
1000.0000 ug | INTRAMUSCULAR | Status: DC
  Administered 2022-07-11: 1000 ug via INTRAMUSCULAR
  Filled 2022-07-11: qty 1

## 2022-07-11 NOTE — Progress Notes (Signed)
[facility]  [phone] [phone]    ID: Jill Summers OB: 12-02-1985  MR#: [id number]  OZD#:[id number]  Patient Care Team: [name], NP as PCP - General (Nurse Practitioner) Magrinat, Valentino Hue, MD (Inactive) as Consulting Physician (Oncology) OTHER MD:  CHIEF COMPLAINT: vit B12 anemia, subacute combined degeneration, iron deficiency  CURRENT THERAPY:  vit B12 supplementation, Feraheme as needed   INTERVAL HISTORY:  Jill Summers returns today for follow-up and treatment of her vitamin B12 anemia, subacute combined degeneration, and iron deficiency.  She tells me that she is has noticed tingling and numbness again in her toes and was worried if she is becoming deficient on B12.  She has not been coming for the past few months for her B12 injections.  She has not been taking any oral iron supplementation numbness which started in the past 3 to 4 weeks, she denies any complaints today.  She has ongoing regular menstrual cycles.  Rest of the pertinent 10 point ROS reviewed and negative  REVIEW OF SYSTEMS:  A detailed review of systems today was otherwise stable   HISTORY OF PRESENT ILLNESS: From the earlier summary:  Jill Summers is a right handed female with history of depression and bronchial asthma as well as recent cellulitis of the umbilicus and treated with Bactrim in September 2015 as well as recent MRSA PCR screen +2 months ago as well as previously untreated B-12 deficiency.  Patient lives with children independent prior to admission and her mother has been assisting with care of the children recently. Admitted 12/16/2013 with bouts of dizziness over the past 2 weeks as well as lower strandy weakness and numbness. She denied any falls. No change in bowel or bladder. Cranial CT scan negative. MRI of thoracic lumbar and cervical spine showed multiple bilateral thoracic cord lesions in the lateral white matter most compatible with  demyelinating disease of question etiology and workup ongoing as per neurology services  and felt cord lesions most likely manifestations of B-12 deficiency. Partial visualization of lower cervical cord shows white matter lesions with full cervical spine films pending. MRI of the brain 12/18/2013 showed less than 10 nonspecific subcentimeter supratentorial white matter T2 hyperintensities in a atypical distribution for classic demyelination. Found to have severe pancytopenia with WBC 1.4, ANC 0.4, hemoglobin 5.5, MCV 101.9, platelets 37,000. Hematology service consulted (Dr. Darnelle Catalan) She was transfused. Etiology is secondary to B-12 deficiency and currently maintained on supplementation with plan to follow-up with hematology services mostly for B-12 injections    PAST MEDICAL HISTORY: Past Medical History:  Diagnosis Date   Adopted    Asthma    since baby   Bacterial vaginosis    Chlamydia    Depression    At age 87   MRSA infection    Obesity    Urinary tract infection     PAST SURGICAL HISTORY: Past Surgical History:  Procedure Laterality Date   CESAREAN SECTION     CESAREAN SECTION WITH BILATERAL TUBAL LIGATION      FAMILY HISTORY Family History  Adopted: Yes  Mother - hypertension, Father - diabetes   GYNECOLOGIC HISTORY:  Patient has regular menstrual periods. She is status post bilateral tubal ligation. 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  Formerly she cleaned houses.  She lives with 17 y/o son Jill Summers). Mother Jill Summers lives  next door--she is a retired Arts administrator SW-- with patient's 89 y/o child, Jill Summers.  ADVANCED DIRECTIVES: not in place   HEALTH MAINTENANCE: Social History   Tobacco Use   Smoking status: Some Days    Types: Cigars    Passive exposure: Never   Smokeless tobacco: Never  Vaping Use   Vaping Use: Never used   Substance Use Topics   Alcohol use: No    Alcohol/week: 2.0 standard drinks of alcohol    Types: 2 Standard drinks or equivalent per week   Drug use: Yes    Types: Marijuana    Comment: Rolled in tobacco    Allergies  Allergen Reactions   Peanut-Containing Drug Products Anaphylaxis, Hives and Swelling    *Throat swelling*   Shellfish Allergy Anaphylaxis, Hives and Swelling    Throat swelling   Aleve [Naproxen]     Current Outpatient Medications  Medication Sig Dispense Refill   acetaminophen (TYLENOL 8 HOUR) 650 MG CR tablet Take 1 tablet (650 mg total) by mouth every 8 (eight) hours as needed. 100 tablet 3   albuterol (PROVENTIL) (2.5 MG/3ML) 0.083% nebulizer solution Take 3 mLs (2.5 mg total) by nebulization every 4 (four) hours as needed for wheezing or shortness of breath. 75 mL 12   albuterol (VENTOLIN HFA) 108 (90 Base) MCG/ACT inhaler Inhale 1-2 puffs into the lungs every 6 (six) hours as needed for wheezing or shortness of breath 6.7 g 11   buPROPion (WELLBUTRIN XL) 150 MG 24 hr tablet Take 1 tablet (150 mg total) by mouth every morning. 90 tablet 3   cyanocobalamin (,VITAMIN B-12,) 1000 MCG/ML injection Inject 1 mL (1,000 mcg total) into the muscle every 30 (thirty) days. 1 mL 11   Fluticasone-Umeclidin-Vilant (TRELEGY ELLIPTA) 200-62.5-25 MCG/ACT AEPB Use 1 inhalation daily for asthma. 60 each 11   hydrOXYzine (ATARAX) 25 MG tablet Take 1-2 tablets (25-50 mg total) by mouth every 6 (six) hours as needed for anxiety. 60 tablet 11   loratadine (CLARITIN) 10 MG tablet Take 1 tablet (10 mg total) by mouth daily. 90 tablet 11   predniSONE (DELTASONE) 10 MG tablet 12 day taper PO at first sign of exacerbation 48 tablet 2   Vitamin D, Ergocalciferol, (DRISDOL) 1.25 MG (50000 UNIT) CAPS capsule Take 1 capsule (50,000 Units total) by mouth every 7 (seven) days. 14 capsule 0   No current facility-administered medications for this visit.    OBJECTIVE: African-American woman in no  acute distress  There were no vitals filed for this visit.   Wt Readings from Last 3 Encounters:  02/03/22 221 lb 8 oz (100.5 kg)  10/09/21 216 lb 7.9 oz (98.2 kg)  07/14/21 216 lb 9.6 oz (98.2 kg)   There is no height or weight on file to calculate BMI.    ECOG FS:1 - Symptomatic but completely ambulatory  Physical Exam Constitutional:      Appearance: Normal appearance.  HENT:     Head: Normocephalic and atraumatic.  Cardiovascular:     Rate and Rhythm: Normal rate and regular rhythm.  Pulmonary:     Breath sounds: No wheezing.  Musculoskeletal:        General: Normal range of motion.  Skin:    General: Skin is warm and dry.  Neurological:     General: No focal deficit present.     Mental Status: She is alert.     Sensory: No sensory deficit.     Motor: No weakness.     Coordination: Coordination normal.  Gait: Gait normal.     Comments: Proprioception is ABnormal in lower extremities     LAB RESULTS:  CBC    Component Value Date/Time   WBC 6.2 07/11/2022 1359   WBC 5.4 02/03/2022 0829   RBC 4.33 07/11/2022 1359   HGB 13.4 07/11/2022 1359   HGB 12.5 07/14/2021 1627   HGB 12.3 01/20/2016 1404   HCT 39.8 07/11/2022 1359   HCT 42.1 02/03/2022 1002   HCT 37.1 01/20/2016 1404   PLT 233 07/11/2022 1359   PLT 246 07/14/2021 1627   MCV 91.9 07/11/2022 1359   MCV 82 07/14/2021 1627   MCV 85.5 01/20/2016 1404   MCH 30.9 07/11/2022 1359   MCHC 33.7 07/11/2022 1359   RDW 13.2 07/11/2022 1359   RDW 22.1 (H) 07/14/2021 1627   RDW 14.1 01/20/2016 1404   LYMPHSABS 2.4 07/11/2022 1359   LYMPHSABS 2.4 07/14/2021 1627   LYMPHSABS 2.8 01/20/2016 1404   MONOABS 0.4 07/11/2022 1359   MONOABS 0.2 01/20/2016 1404   EOSABS 0.3 07/11/2022 1359   EOSABS 0.2 07/14/2021 1627   BASOSABS 0.1 07/11/2022 1359   BASOSABS 0.0 07/14/2021 1627   BASOSABS 0.0 01/20/2016 1404   CMP     Component Value Date/Time   NA 140 07/11/2022 1359   NA 139 07/14/2021 1627   NA 140  02/04/2015 1049   K 3.9 07/11/2022 1359   K 3.9 02/04/2015 1049   CL 108 07/11/2022 1359   CO2 27 07/11/2022 1359   CO2 23 02/04/2015 1049   GLUCOSE 96 07/11/2022 1359   GLUCOSE 98 02/04/2015 1049   BUN 11 07/11/2022 1359   BUN 9 07/14/2021 1627   BUN 9.8 02/04/2015 1049   CREATININE 1.12 (H) 07/11/2022 1359   CREATININE 0.9 02/04/2015 1049   CALCIUM 9.2 07/11/2022 1359   CALCIUM 8.8 02/04/2015 1049   PROT 6.9 07/11/2022 1359   PROT 6.8 07/14/2021 1627   PROT 6.9 02/04/2015 1049   ALBUMIN 3.9 07/11/2022 1359   ALBUMIN 4.0 07/14/2021 1627   ALBUMIN 3.4 (L) 02/04/2015 1049   AST 17 07/11/2022 1359   AST 13 02/04/2015 1049   ALT 14 07/11/2022 1359   ALT <9 02/04/2015 1049   ALKPHOS 34 (L) 07/11/2022 1359   ALKPHOS 53 02/04/2015 1049   BILITOT 0.4 07/11/2022 1359   BILITOT 0.47 02/04/2015 1049   GFRNONAA >60 07/11/2022 1359   GFRAA >60 11/05/2018 1036   GFRAA >60 01/29/2018 0831        Component Value Date/Time   COLORURINE AMBER (A) 12/16/2013 0707   APPEARANCEUR HAZY (A) 12/16/2013 0707   LABSPEC 1.025 12/16/2013 0707   PHURINE 5.0 12/16/2013 0707   GLUCOSEU NEGATIVE 12/16/2013 0707   HGBUR LARGE (A) 12/16/2013 0707   BILIRUBINUR SMALL (A) 12/16/2013 0707   KETONESUR NEGATIVE 12/16/2013 0707   PROTEINUR NEGATIVE 12/16/2013 0707   UROBILINOGEN >8.0 (H) 12/16/2013 0707   NITRITE NEGATIVE 12/16/2013 0707   LEUKOCYTESUR SMALL (A) 12/16/2013 0707   Iron/TIBC/Ferritin/ %Sat    Component Value Date/Time   IRON 72 02/03/2022 1003   IRON 132 07/14/2021 1627   TIBC 330 02/03/2022 1003   TIBC 263 07/14/2021 1627   FERRITIN 50 02/03/2022 1002   FERRITIN 338 (H) 07/14/2021 1627   FERRITIN 9 12/09/2015 1037   IRONPCTSAT 22 02/03/2022 1003   IRONPCTSAT 50 07/14/2021 1627    STUDIES: No results found.   ASSESSMENT: 37 y.o. [place] woman presenting 11/15/2013 with inability to walk, fever and pancytopenia, found to be  severely B-12 deficient.  (1) B-12 deficiency:  will need lifelong replacement; as patient does not have PCP this can be done through our office             (a) received montlhy  (2) subacute combined degeneration - Stable  (3) iron deficiency: Last ferritin level in February 2023 of 4  PLAN:   [name] is here for follow-up.   Since last visit, she states she has noticed worsening neuropathy and has been stumbling for the past 3 to 4 weeks.  She has not been taking B12 injections as recommended, last dose was back in February.  Abnormal proprioception noted in the left great on exam today.  I have recommended that she restart B12 supplementation monthly.  Will arrange for this as soon as possible.  She will return to clinic in about 8 weeks for follow-up.  I will recommend iron supplementation based on ferritin from today. Total time spent: 30 minutes   *Total Encounter Time as defined by the Centers for Medicare and Medicaid Services includes, in addition to the face-to-face time of a patient visit (documented in the note above) non-face-to-face time: obtaining and reviewing outside history, ordering and reviewing medications, tests or procedures, care coordination (communications with other health care professionals or caregivers) and documentation in the medical record.  Rachel Moulds MD

## 2022-07-11 NOTE — Patient Instructions (Signed)

## 2022-07-12 ENCOUNTER — Telehealth: Payer: Self-pay | Admitting: Hematology and Oncology

## 2022-07-12 NOTE — Telephone Encounter (Signed)
Spoke with patient confirming upcoming appointment  

## 2022-07-13 ENCOUNTER — Telehealth: Payer: Self-pay

## 2022-07-13 ENCOUNTER — Other Ambulatory Visit (HOSPITAL_BASED_OUTPATIENT_CLINIC_OR_DEPARTMENT_OTHER): Payer: Self-pay

## 2022-07-13 ENCOUNTER — Encounter: Payer: Self-pay | Admitting: Oncology

## 2022-07-13 MED ORDER — FERROUS SULFATE 325 (65 FE) MG PO TBEC
325.0000 mg | DELAYED_RELEASE_TABLET | ORAL | 0 refills | Status: DC
  Filled 2022-07-13 – 2022-08-17 (×3): qty 30, 60d supply, fill #0

## 2022-07-13 NOTE — Telephone Encounter (Signed)
Called and LVM with below message d/t Ferritin level. Order for iron supplement placed. Advised Pt that supplement can also be given over the counter. Asked for call back to verify that message was received and to answer any questions.

## 2022-07-13 NOTE — Telephone Encounter (Signed)
-----   Message from Rachel Moulds, MD sent at 07/13/2022  1:55 PM EDT ----- Ms Tamas needs to start taking oral iron, ferrous sulfate 65/325 mg every other day please.

## 2022-07-24 ENCOUNTER — Other Ambulatory Visit (HOSPITAL_BASED_OUTPATIENT_CLINIC_OR_DEPARTMENT_OTHER): Payer: Self-pay

## 2022-08-07 ENCOUNTER — Other Ambulatory Visit (HOSPITAL_BASED_OUTPATIENT_CLINIC_OR_DEPARTMENT_OTHER): Payer: Self-pay

## 2022-08-07 ENCOUNTER — Other Ambulatory Visit (HOSPITAL_BASED_OUTPATIENT_CLINIC_OR_DEPARTMENT_OTHER): Payer: Self-pay | Admitting: Nurse Practitioner

## 2022-08-07 ENCOUNTER — Encounter: Payer: Self-pay | Admitting: Oncology

## 2022-08-07 DIAGNOSIS — D508 Other iron deficiency anemias: Secondary | ICD-10-CM

## 2022-08-07 DIAGNOSIS — F3340 Major depressive disorder, recurrent, in remission, unspecified: Secondary | ICD-10-CM

## 2022-08-07 DIAGNOSIS — G63 Polyneuropathy in diseases classified elsewhere: Secondary | ICD-10-CM

## 2022-08-07 DIAGNOSIS — E538 Deficiency of other specified B group vitamins: Secondary | ICD-10-CM

## 2022-08-07 DIAGNOSIS — J4550 Severe persistent asthma, uncomplicated: Secondary | ICD-10-CM

## 2022-08-07 DIAGNOSIS — K5901 Slow transit constipation: Secondary | ICD-10-CM

## 2022-08-07 DIAGNOSIS — D61818 Other pancytopenia: Secondary | ICD-10-CM

## 2022-08-07 MED ORDER — ALBUTEROL SULFATE HFA 108 (90 BASE) MCG/ACT IN AERS
1.0000 | INHALATION_SPRAY | Freq: Four times a day (QID) | RESPIRATORY_TRACT | 0 refills | Status: DC | PRN
Start: 2022-08-07 — End: 2023-01-11
  Filled 2022-08-07 – 2022-08-17 (×2): qty 6.7, 25d supply, fill #0

## 2022-08-07 MED ORDER — LORATADINE 10 MG PO TABS
10.0000 mg | ORAL_TABLET | Freq: Every day | ORAL | 0 refills | Status: DC
Start: 2022-08-07 — End: 2023-03-01
  Filled 2022-08-07: qty 100, 100d supply, fill #0

## 2022-08-10 ENCOUNTER — Inpatient Hospital Stay: Payer: Medicare Other | Attending: Hematology and Oncology

## 2022-08-15 ENCOUNTER — Other Ambulatory Visit (HOSPITAL_BASED_OUTPATIENT_CLINIC_OR_DEPARTMENT_OTHER): Payer: Self-pay

## 2022-08-17 ENCOUNTER — Other Ambulatory Visit (HOSPITAL_COMMUNITY): Payer: Self-pay

## 2022-08-17 ENCOUNTER — Other Ambulatory Visit (HOSPITAL_BASED_OUTPATIENT_CLINIC_OR_DEPARTMENT_OTHER): Payer: Self-pay

## 2022-08-17 ENCOUNTER — Encounter: Payer: Self-pay | Admitting: Oncology

## 2022-09-04 ENCOUNTER — Other Ambulatory Visit (HOSPITAL_BASED_OUTPATIENT_CLINIC_OR_DEPARTMENT_OTHER): Payer: Self-pay

## 2022-09-08 ENCOUNTER — Encounter: Payer: Self-pay | Admitting: Adult Health

## 2022-09-08 ENCOUNTER — Other Ambulatory Visit: Payer: Self-pay

## 2022-09-08 ENCOUNTER — Inpatient Hospital Stay: Payer: Medicare Other | Attending: Hematology and Oncology | Admitting: Adult Health

## 2022-09-08 ENCOUNTER — Inpatient Hospital Stay: Payer: Medicare Other

## 2022-09-08 ENCOUNTER — Other Ambulatory Visit: Payer: Self-pay | Admitting: *Deleted

## 2022-09-08 VITALS — BP 109/70 | HR 87 | Temp 97.9°F | Resp 18 | Ht 63.0 in | Wt 218.5 lb

## 2022-09-08 DIAGNOSIS — E538 Deficiency of other specified B group vitamins: Secondary | ICD-10-CM

## 2022-09-08 DIAGNOSIS — D509 Iron deficiency anemia, unspecified: Secondary | ICD-10-CM | POA: Diagnosis not present

## 2022-09-08 DIAGNOSIS — K5900 Constipation, unspecified: Secondary | ICD-10-CM | POA: Diagnosis not present

## 2022-09-08 DIAGNOSIS — E539 Vitamin B deficiency, unspecified: Secondary | ICD-10-CM

## 2022-09-08 DIAGNOSIS — D508 Other iron deficiency anemias: Secondary | ICD-10-CM | POA: Diagnosis not present

## 2022-09-08 DIAGNOSIS — D61818 Other pancytopenia: Secondary | ICD-10-CM

## 2022-09-08 DIAGNOSIS — R29898 Other symptoms and signs involving the musculoskeletal system: Secondary | ICD-10-CM

## 2022-09-08 DIAGNOSIS — G8220 Paraplegia, unspecified: Secondary | ICD-10-CM

## 2022-09-08 LAB — CMP (CANCER CENTER ONLY)
ALT: 7 U/L (ref 0–44)
AST: 10 U/L — ABNORMAL LOW (ref 15–41)
Albumin: 3.7 g/dL (ref 3.5–5.0)
Alkaline Phosphatase: 38 U/L (ref 38–126)
Anion gap: 5 (ref 5–15)
BUN: 12 mg/dL (ref 6–20)
CO2: 23 mmol/L (ref 22–32)
Calcium: 8.6 mg/dL — ABNORMAL LOW (ref 8.9–10.3)
Chloride: 110 mmol/L (ref 98–111)
Creatinine: 0.94 mg/dL (ref 0.44–1.00)
GFR, Estimated: >60 mL/min (ref >60)
Glucose, Bld: 95 mg/dL (ref 70–99)
Potassium: 3.9 mmol/L (ref 3.5–5.1)
Sodium: 138 mmol/L (ref 135–145)
Total Bilirubin: 0.4 mg/dL (ref 0.3–1.2)
Total Protein: 6.6 g/dL (ref 6.5–8.1)

## 2022-09-08 LAB — CBC WITH DIFFERENTIAL (CANCER CENTER ONLY)
Abs Immature Granulocytes: 0.02 10*3/uL (ref 0.00–0.07)
Basophils Absolute: 0 10*3/uL (ref 0.0–0.1)
Basophils Relative: 1 %
Eosinophils Absolute: 0.2 10*3/uL (ref 0.0–0.5)
Eosinophils Relative: 3 %
HCT: 37.7 % (ref 36.0–46.0)
Hemoglobin: 12.8 g/dL (ref 12.0–15.0)
Immature Granulocytes: 0 %
Lymphocytes Relative: 33 %
Lymphs Abs: 2 10*3/uL (ref 0.7–4.0)
MCH: 30.4 pg (ref 26.0–34.0)
MCHC: 34 g/dL (ref 30.0–36.0)
MCV: 89.5 fL (ref 80.0–100.0)
Monocytes Absolute: 0.4 10*3/uL (ref 0.1–1.0)
Monocytes Relative: 6 %
Neutro Abs: 3.4 10*3/uL (ref 1.7–7.7)
Neutrophils Relative %: 57 %
Platelet Count: 242 10*3/uL (ref 150–400)
RBC: 4.21 MIL/uL (ref 3.87–5.11)
RDW: 12.7 % (ref 11.5–15.5)
WBC Count: 6 10*3/uL (ref 4.0–10.5)
nRBC: 0 % (ref 0.0–0.2)

## 2022-09-08 LAB — FERRITIN: Ferritin: 8 ng/mL — ABNORMAL LOW (ref 11–307)

## 2022-09-08 LAB — IRON AND IRON BINDING CAPACITY (CC-WL,HP ONLY)
Iron: 99 ug/dL (ref 28–170)
Saturation Ratios: 28 % (ref 10.4–31.8)
TIBC: 360 ug/dL (ref 250–450)
UIBC: 261 ug/dL (ref 148–442)

## 2022-09-08 LAB — VITAMIN B12: Vitamin B-12: 112 pg/mL — ABNORMAL LOW (ref 180–914)

## 2022-09-08 MED ORDER — CYANOCOBALAMIN 1000 MCG/ML IJ SOLN
1000.0000 ug | INTRAMUSCULAR | Status: DC
  Administered 2022-09-08: 1000 ug via INTRAMUSCULAR
  Filled 2022-09-08: qty 1

## 2022-09-08 NOTE — Assessment & Plan Note (Signed)
We are going to recheck ferritin levels today.  She has not taken oral iron and due to her constipation I do not anticipate that she will.  Based on her lab results we may give her IV iron instead.  I also placed a referral to GI to evaluate for potential GI losses of blood since her menstrual cycles are normal.  I also asked that they evaluate her significant constipation further.

## 2022-09-08 NOTE — Progress Notes (Signed)
[facility] Follow up:    [name], NP [address] [place] [address]   DIAGNOSIS: B12 AND IRON DEFICIENCY  SUMMARY OF HEMATOLOGIC HISTORY:  [place] woman presenting 11/15/2013 with inability to walk, fever and pancytopenia, found to be severely B-12 deficient.   (1) B-12 deficiency: will need lifelong replacement; as patient does not have PCP this can be done through our office             (a) received montlhy   (2) subacute combined degeneration - Stable   (3) iron deficiency: Last ferritin level in February 2023 of 4  CURRENT THERAPY:B12 injection, intermittent feraheme  INTERVAL HISTORY: Jill Summers 37 y.o. female returns for follow-up and evaluation of her B12 deficiency and iron deficiency.  She was recommended to start oral iron at her last appointment however has not done so.  She receives her B12 injections and tolerates these well.  We reviewed her menstrual cycles and she notes that she has a 4-day long menstrual period every 28 days.  She notes a pad count of about 4/day but uses tampons that vary in absorption size.  [name] notes significant constipation.  She has 1 bowel movement a week.  She drinks 4 bottles of water a day, exercises daily, and tells me that she has tried multiple over-the-counter laxatives without any relief.  Patient Active Problem List   Diagnosis Date Noted   Severe persistent asthma 07/29/2021   Urticaria of unknown origin 07/29/2021   Iron deficiency anemia 02/24/2016   Constipation 01/06/2014   Major depressive disorder, recurrent, in remission (HCC)    History of paraplegia 12/19/2013   Polyneuropathy due to vitamin B deficiency (HCC) 12/19/2013   Vitamin B 12 deficiency 12/18/2013   Pancytopenia (HCC) 12/15/2013   MRSA (methicillin resistant staph aureus) culture positive 07/27/2013    is allergic to peanut-containing drug products, shellfish allergy, and aleve  [naproxen].  MEDICAL HISTORY: Past Medical History:  Diagnosis Date   Adopted    Asthma    since baby   Bacterial vaginosis    Chlamydia    Depression    At age 84   MRSA infection    Obesity    Urinary tract infection     SURGICAL HISTORY: Past Surgical History:  Procedure Laterality Date   CESAREAN SECTION     CESAREAN SECTION WITH BILATERAL TUBAL LIGATION      SOCIAL HISTORY: Social History   Socioeconomic History   Marital status: Single    Spouse name: Not on file   Number of children: Not on file   Years of education: Not on file   Highest education level: Not on file  Occupational History   Not on file  Tobacco Use   Smoking status: Some Days    Types: Cigars    Passive exposure: Never   Smokeless tobacco: Never  Vaping Use   Vaping status: Never Used  Substance and Sexual Activity   Alcohol use: No    Alcohol/week: 2.0 standard drinks of alcohol    Types: 2 Standard drinks or equivalent per week   Drug use: Yes    Types: Marijuana    Comment: Rolled in tobacco   Sexual activity: Yes    Birth control/protection: Surgical  Other Topics Concern   Not on file  Social History Narrative   Not on file   Social Determinants of Health   Financial Resource Strain: Not on file  Food Insecurity: Not  on file  Transportation Needs: Not on file  Physical Activity: Not on file  Stress: Not on file  Social Connections: Not on file  Intimate Partner Violence: Not on file    FAMILY HISTORY: Family History  Adopted: Yes    Review of Systems  Constitutional:  Negative for appetite change, chills, fatigue, fever and unexpected weight change.  HENT:   Negative for hearing loss, lump/mass and trouble swallowing.   Eyes:  Negative for eye problems and icterus.  Respiratory:  Negative for chest tightness, cough and shortness of breath.   Cardiovascular:  Negative for chest pain, leg swelling and palpitations.  Gastrointestinal:  Positive for constipation.  Negative for abdominal distention, abdominal pain, blood in stool, diarrhea, nausea, rectal pain and vomiting.  Endocrine: Negative for hot flashes.  Genitourinary:  Negative for difficulty urinating.   Musculoskeletal:  Negative for arthralgias.  Skin:  Negative for itching and rash.  Neurological:  Negative for dizziness, extremity weakness, headaches and numbness.  Hematological:  Negative for adenopathy. Does not bruise/bleed easily.  Psychiatric/Behavioral:  Negative for depression. The patient is not nervous/anxious.       PHYSICAL EXAMINATION    Vitals:   09/08/22 1415  BP: 109/70  Pulse: 87  Resp: 18  Temp: 97.9 F (36.6 C)  SpO2: 100%    Physical Exam Constitutional:      General: She is not in acute distress.    Appearance: Normal appearance. She is not toxic-appearing.  HENT:     Head: Normocephalic and atraumatic.     Mouth/Throat:     Mouth: Mucous membranes are moist.     Pharynx: Oropharynx is clear. No oropharyngeal exudate or posterior oropharyngeal erythema.  Eyes:     General: No scleral icterus. Cardiovascular:     Rate and Rhythm: Normal rate and regular rhythm.     Pulses: Normal pulses.     Heart sounds: Normal heart sounds.  Pulmonary:     Effort: Pulmonary effort is normal.     Breath sounds: Normal breath sounds.  Abdominal:     General: Abdomen is flat. Bowel sounds are normal. There is no distension.     Palpations: Abdomen is soft.     Tenderness: There is no abdominal tenderness.  Musculoskeletal:        General: No swelling.     Cervical back: Neck supple.  Lymphadenopathy:     Cervical: No cervical adenopathy.  Skin:    General: Skin is warm and dry.     Findings: No rash.  Neurological:     General: No focal deficit present.     Mental Status: She is alert.  Psychiatric:        Mood and Affect: Mood normal.        Behavior: Behavior normal.        ASSESSMENT and THERAPY PLAN:   Vitamin B 12 deficiency She will  continue on B12 injections given every 4 weeks--she tolerates these well.  We are rechecking a B12 level today.  She did miss last month's dose.  Encouraged her to keep her B12 injections as scheduled and let us know if we need to adjust them.  Iron deficiency anemia We are going to recheck ferritin levels today.  She has not taken oral iron and due to her constipation I do not anticipate that she will.  Based on her lab results we may give her IV iron instead.  I also placed a referral to GI to evaluate for potential  GI losses of blood since her menstrual cycles are normal.  I also asked that they evaluate her significant constipation further.    RTC every 4 weeks for b12 injections, labs and f/u in 16 weeks.   All questions were answered. The patient knows to call the clinic with any problems, questions or concerns. We can certainly see the patient much sooner if necessary.  Total encounter time:30 minutes*in face-to-face visit time, chart review, lab review, care coordination, order entry, and documentation of the encounter time.  Lillard Anes, NP 09/08/22 2:59 PM Medical Oncology and Hematology [facility] [address] [place], [place] [address] Tel. [phone]    Fax. [phone]  *Total Encounter Time as defined by the Centers for Medicare and Medicaid Services includes, in addition to the face-to-face time of a patient visit (documented in the note above) non-face-to-face time: obtaining and reviewing outside history, ordering and reviewing medications, tests or procedures, care coordination (communications with other health care professionals or caregivers) and documentation in the medical record.

## 2022-09-08 NOTE — Assessment & Plan Note (Signed)
She will continue on B12 injections given every 4 weeks--she tolerates these well.  We are rechecking a B12 level today.  She did miss last month's dose.  Encouraged her to keep her B12 injections as scheduled and let us know if we need to adjust them.

## 2022-09-14 ENCOUNTER — Other Ambulatory Visit: Payer: Self-pay | Admitting: Adult Health

## 2022-09-14 ENCOUNTER — Telehealth: Payer: Self-pay | Admitting: Adult Health

## 2022-09-14 NOTE — Telephone Encounter (Signed)
Per Lillard Anes, DNP, called pt with message below. Pt verbalized understanding and agreed to IV iron. Notified provider of pt decision and will f/u

## 2022-09-14 NOTE — Telephone Encounter (Signed)
-----   Message from Noreene Filbert sent at 09/13/2022  9:36 PM EDT ----- Please let patient know her iron level is low and I recommend IV iron weekly x 3.  If agreeable I will put in orders. ----- Message ----- From: Leory Plowman, Lab In [place] Sent: 09/08/2022   3:09 PM EDT To: Loa Socks, NP

## 2022-09-15 ENCOUNTER — Telehealth: Payer: Self-pay | Admitting: Adult Health

## 2022-09-15 NOTE — Telephone Encounter (Signed)
Patient is aware of scheduled appointment times/dates

## 2022-09-25 ENCOUNTER — Inpatient Hospital Stay: Payer: Medicare Other

## 2022-09-25 ENCOUNTER — Other Ambulatory Visit: Payer: Self-pay | Admitting: Hematology and Oncology

## 2022-09-25 ENCOUNTER — Other Ambulatory Visit: Payer: Self-pay | Admitting: *Deleted

## 2022-09-25 VITALS — BP 130/91 | HR 72 | Temp 98.2°F | Resp 16

## 2022-09-25 DIAGNOSIS — Z8669 Personal history of other diseases of the nervous system and sense organs: Secondary | ICD-10-CM

## 2022-09-25 DIAGNOSIS — R29898 Other symptoms and signs involving the musculoskeletal system: Secondary | ICD-10-CM

## 2022-09-25 DIAGNOSIS — D61818 Other pancytopenia: Secondary | ICD-10-CM

## 2022-09-25 DIAGNOSIS — E538 Deficiency of other specified B group vitamins: Secondary | ICD-10-CM | POA: Diagnosis not present

## 2022-09-25 DIAGNOSIS — D509 Iron deficiency anemia, unspecified: Secondary | ICD-10-CM | POA: Diagnosis not present

## 2022-09-25 DIAGNOSIS — K5900 Constipation, unspecified: Secondary | ICD-10-CM | POA: Diagnosis not present

## 2022-09-25 DIAGNOSIS — G8220 Paraplegia, unspecified: Secondary | ICD-10-CM

## 2022-09-25 DIAGNOSIS — G63 Polyneuropathy in diseases classified elsewhere: Secondary | ICD-10-CM

## 2022-09-25 MED ORDER — SODIUM CHLORIDE 0.9 % IV SOLN: Freq: Once | INTRAVENOUS | Status: AC

## 2022-09-25 MED ORDER — SODIUM CHLORIDE 0.9 % IV SOLN
300.0000 mg | Freq: Once | INTRAVENOUS | Status: AC
  Administered 2022-09-25: 300 mg via INTRAVENOUS
  Filled 2022-09-25: qty 200

## 2022-09-25 NOTE — Progress Notes (Signed)
Patient was observed for 30 minutes post IV iron infusion with no adverse reaction. Vitals stable and patient in no distress upon leaving [facility].

## 2022-09-25 NOTE — Patient Instructions (Signed)
 Iron Sucrose Injection What is this medication? IRON SUCROSE (EYE ern SOO krose) treats low levels of iron (iron deficiency anemia) in people with kidney disease. Iron is a mineral that plays an important role in making red blood cells, which carry oxygen from your lungs to the rest of your body. This medicine may be used for other purposes; ask your health care provider or pharmacist if you have questions. COMMON BRAND NAME(S): Venofer What should I tell my care team before I take this medication? They need to know if you have any of these conditions: Anemia not caused by low iron levels Heart disease High levels of iron in the blood Kidney disease Liver disease An unusual or allergic reaction to iron, other medications, foods, dyes, or preservatives Pregnant or trying to get pregnant Breastfeeding How should I use this medication? This medication is for infusion into a vein. It is given in a hospital or clinic setting. Talk to your care team about the use of this medication in children. While this medication may be prescribed for children as young as 2 years for selected conditions, precautions do apply. Overdosage: If you think you have taken too much of this medicine contact a poison control center or emergency room at once. NOTE: This medicine is only for you. Do not share this medicine with others. What if I miss a dose? Keep appointments for follow-up doses. It is important not to miss your dose. Call your care team if you are unable to keep an appointment. What may interact with this medication? Do not take this medication with any of the following: Deferoxamine Dimercaprol Other iron products This medication may also interact with the following: Chloramphenicol Deferasirox This list may not describe all possible interactions. Give your health care provider a list of all the medicines, herbs, non-prescription drugs, or dietary supplements you use. Also tell them if you smoke,  drink alcohol, or use illegal drugs. Some items may interact with your medicine. What should I watch for while using this medication? Visit your care team regularly. Tell your care team if your symptoms do not start to get better or if they get worse. You may need blood work done while you are taking this medication. You may need to follow a special diet. Talk to your care team. Foods that contain iron include: whole grains/cereals, dried fruits, beans, or peas, leafy green vegetables, and organ meats (liver, kidney). What side effects may I notice from receiving this medication? Side effects that you should report to your care team as soon as possible: Allergic reactions--skin rash, itching, hives, swelling of the face, lips, tongue, or throat Low blood pressure--dizziness, feeling faint or lightheaded, blurry vision Shortness of breath Side effects that usually do not require medical attention (report to your care team if they continue or are bothersome): Flushing Headache Joint pain Muscle pain Nausea Pain, redness, or irritation at injection site This list may not describe all possible side effects. Call your doctor for medical advice about side effects. You may report side effects to FDA at 1-800-FDA-1088. Where should I keep my medication? This medication is given in a hospital or clinic. It will not be stored at home. NOTE: This sheet is a summary. It may not cover all possible information. If you have questions about this medicine, talk to your doctor, pharmacist, or health care provider.  2024 Elsevier/Gold Standard (2022-06-23 00:00:00)

## 2022-10-06 ENCOUNTER — Inpatient Hospital Stay: Payer: Medicare Other

## 2022-10-06 ENCOUNTER — Inpatient Hospital Stay: Payer: Medicare Other | Attending: Hematology and Oncology

## 2022-10-06 VITALS — BP 117/81 | HR 70 | Temp 98.3°F | Resp 18

## 2022-10-06 DIAGNOSIS — E539 Vitamin B deficiency, unspecified: Secondary | ICD-10-CM

## 2022-10-06 DIAGNOSIS — Z8669 Personal history of other diseases of the nervous system and sense organs: Secondary | ICD-10-CM

## 2022-10-06 DIAGNOSIS — E538 Deficiency of other specified B group vitamins: Secondary | ICD-10-CM | POA: Insufficient documentation

## 2022-10-06 DIAGNOSIS — D509 Iron deficiency anemia, unspecified: Secondary | ICD-10-CM | POA: Insufficient documentation

## 2022-10-06 DIAGNOSIS — D61818 Other pancytopenia: Secondary | ICD-10-CM

## 2022-10-06 DIAGNOSIS — G8220 Paraplegia, unspecified: Secondary | ICD-10-CM

## 2022-10-06 DIAGNOSIS — R29898 Other symptoms and signs involving the musculoskeletal system: Secondary | ICD-10-CM

## 2022-10-06 MED ORDER — SODIUM CHLORIDE 0.9 % IV SOLN: Freq: Once | INTRAVENOUS | Status: AC

## 2022-10-06 MED ORDER — SODIUM CHLORIDE 0.9 % IV SOLN
300.0000 mg | Freq: Once | INTRAVENOUS | Status: AC
  Administered 2022-10-06: 300 mg via INTRAVENOUS
  Filled 2022-10-06: qty 300

## 2022-10-06 MED ORDER — CYANOCOBALAMIN 1000 MCG/ML IJ SOLN
1000.0000 ug | INTRAMUSCULAR | Status: DC
  Administered 2022-10-06: 1000 ug via INTRAMUSCULAR
  Filled 2022-10-06: qty 1

## 2022-10-06 NOTE — Patient Instructions (Signed)
Iron Sucrose Injection What is this medication? IRON SUCROSE (EYE ern SOO krose) treats low levels of iron (iron deficiency anemia) in people with kidney disease. Iron is a mineral that plays an important role in making red blood cells, which carry oxygen from your lungs to the rest of your body. This medicine may be used for other purposes; ask your health care provider or pharmacist if you have questions. COMMON BRAND NAME(S): Venofer What should I tell my care team before I take this medication? They need to know if you have any of these conditions: Anemia not caused by low iron levels Heart disease High levels of iron in the blood Kidney disease Liver disease An unusual or allergic reaction to iron, other medications, foods, dyes, or preservatives Pregnant or trying to get pregnant Breastfeeding How should I use this medication? This medication is for infusion into a vein. It is given in a hospital or clinic setting. Talk to your care team about the use of this medication in children. While this medication may be prescribed for children as young as 2 years for selected conditions, precautions do apply. Overdosage: If you think you have taken too much of this medicine contact a poison control center or emergency room at once. NOTE: This medicine is only for you. Do not share this medicine with others. What if I miss a dose? Keep appointments for follow-up doses. It is important not to miss your dose. Call your care team if you are unable to keep an appointment. What may interact with this medication? Do not take this medication with any of the following: Deferoxamine Dimercaprol Other iron products This medication may also interact with the following: Chloramphenicol Deferasirox This list may not describe all possible interactions. Give your health care provider a list of all the medicines, herbs, non-prescription drugs, or dietary supplements you use. Also tell them if you smoke,  drink alcohol, or use illegal drugs. Some items may interact with your medicine. What should I watch for while using this medication? Visit your care team regularly. Tell your care team if your symptoms do not start to get better or if they get worse. You may need blood work done while you are taking this medication. You may need to follow a special diet. Talk to your care team. Foods that contain iron include: whole grains/cereals, dried fruits, beans, or peas, leafy green vegetables, and organ meats (liver, kidney). What side effects may I notice from receiving this medication? Side effects that you should report to your care team as soon as possible: Allergic reactions--skin rash, itching, hives, swelling of the face, lips, tongue, or throat Low blood pressure--dizziness, feeling faint or lightheaded, blurry vision Shortness of breath Side effects that usually do not require medical attention (report to your care team if they continue or are bothersome): Flushing Headache Joint pain Muscle pain Nausea Pain, redness, or irritation at injection site This list may not describe all possible side effects. Call your doctor for medical advice about side effects. You may report side effects to FDA at 1-800-FDA-1088. Where should I keep my medication? This medication is given in a hospital or clinic. It will not be stored at home. NOTE: This sheet is a summary. It may not cover all possible information. If you have questions about this medicine, talk to your doctor, pharmacist, or health care provider.  2024 Elsevier/Gold Standard (2022-06-23 00:00:00)

## 2022-10-06 NOTE — Progress Notes (Signed)
Patient declined to stay 30 minute observation post iron infusion. VSS at discharge. Patient confirms appt next week.

## 2022-10-13 ENCOUNTER — Inpatient Hospital Stay: Payer: Medicare Other

## 2022-10-26 ENCOUNTER — Encounter: Payer: Self-pay | Admitting: Oncology

## 2022-10-26 ENCOUNTER — Other Ambulatory Visit: Payer: Self-pay

## 2022-10-26 ENCOUNTER — Encounter (HOSPITAL_BASED_OUTPATIENT_CLINIC_OR_DEPARTMENT_OTHER): Payer: Self-pay

## 2022-10-26 ENCOUNTER — Other Ambulatory Visit (HOSPITAL_BASED_OUTPATIENT_CLINIC_OR_DEPARTMENT_OTHER): Payer: Self-pay

## 2022-10-26 ENCOUNTER — Emergency Department (HOSPITAL_BASED_OUTPATIENT_CLINIC_OR_DEPARTMENT_OTHER)
Admission: EM | Admit: 2022-10-26 | Discharge: 2022-10-26 | Disposition: A | Discharge status: Home / Self Care | Payer: Medicare Other | Attending: Emergency Medicine | Admitting: Emergency Medicine

## 2022-10-26 DIAGNOSIS — D508 Other iron deficiency anemias: Secondary | ICD-10-CM

## 2022-10-26 DIAGNOSIS — E539 Vitamin B deficiency, unspecified: Secondary | ICD-10-CM

## 2022-10-26 DIAGNOSIS — Z91010 Allergy to peanuts: Secondary | ICD-10-CM | POA: Insufficient documentation

## 2022-10-26 DIAGNOSIS — R0602 Shortness of breath: Secondary | ICD-10-CM | POA: Diagnosis present

## 2022-10-26 DIAGNOSIS — Z7951 Long term (current) use of inhaled steroids: Secondary | ICD-10-CM | POA: Insufficient documentation

## 2022-10-26 DIAGNOSIS — J45909 Unspecified asthma, uncomplicated: Secondary | ICD-10-CM | POA: Diagnosis not present

## 2022-10-26 DIAGNOSIS — K5901 Slow transit constipation: Secondary | ICD-10-CM

## 2022-10-26 DIAGNOSIS — J4550 Severe persistent asthma, uncomplicated: Secondary | ICD-10-CM

## 2022-10-26 DIAGNOSIS — E538 Deficiency of other specified B group vitamins: Secondary | ICD-10-CM

## 2022-10-26 DIAGNOSIS — F3340 Major depressive disorder, recurrent, in remission, unspecified: Secondary | ICD-10-CM

## 2022-10-26 DIAGNOSIS — J4530 Mild persistent asthma, uncomplicated: Secondary | ICD-10-CM | POA: Insufficient documentation

## 2022-10-26 DIAGNOSIS — D61818 Other pancytopenia: Secondary | ICD-10-CM

## 2022-10-26 MED ORDER — IPRATROPIUM-ALBUTEROL 0.5-2.5 (3) MG/3ML IN SOLN
3.0000 mL | Freq: Once | RESPIRATORY_TRACT | Status: AC
  Administered 2022-10-26: 3 mL via RESPIRATORY_TRACT
  Filled 2022-10-26: qty 3

## 2022-10-26 MED ORDER — PREDNISONE 50 MG PO TABS
60.0000 mg | ORAL_TABLET | Freq: Once | ORAL | Status: AC
  Administered 2022-10-26: 60 mg via ORAL
  Filled 2022-10-26: qty 1

## 2022-10-26 MED ORDER — PREDNISONE 20 MG PO TABS
40.0000 mg | ORAL_TABLET | Freq: Every day | ORAL | 0 refills | Status: AC
  Filled 2022-10-26: qty 8, 4d supply, fill #0

## 2022-10-26 MED ORDER — ALBUTEROL SULFATE HFA 108 (90 BASE) MCG/ACT IN AERS
2.0000 | INHALATION_SPRAY | Freq: Four times a day (QID) | RESPIRATORY_TRACT | Status: DC | PRN
  Administered 2022-10-26: 2 via RESPIRATORY_TRACT
  Filled 2022-10-26: qty 6.7

## 2022-10-26 MED ORDER — ALBUTEROL SULFATE (2.5 MG/3ML) 0.083% IN NEBU
2.5000 mg | INHALATION_SOLUTION | RESPIRATORY_TRACT | 12 refills | Status: DC | PRN
  Filled 2022-10-26: qty 75, 5d supply, fill #0

## 2022-10-26 NOTE — ED Provider Notes (Signed)
[place] EMERGENCY DEPARTMENT AT [facility] Provider Note   CSN: [id number] Arrival date & time: 10/26/22  0705     History  Chief Complaint  Patient presents with   Shortness of Breath    Jill Summers is a 37 y.o. female.  Patient here with asthma symptoms since this morning.  Does not have any nebulizer or inhalers at home.  Mild symptoms.  No cough fever or chills.  Thinks that weather is doing this.  Denies any chest pain abdominal pain nausea vomiting diarrhea.  No concern for COVID or other infectious process.  Struve asthma.  The history is provided by the patient.       Home Medications Prior to Admission medications   Medication Sig Start Date End Date Taking? Authorizing Provider  predniSONE (DELTASONE) 20 MG tablet Take 2 tablets (40 mg total) by mouth daily for 4 days. 10/26/22 10/30/22 Yes Lumina Gitto, DO  acetaminophen (TYLENOL 8 HOUR) 650 MG CR tablet Take 1 tablet (650 mg total) by mouth every 8 (eight) hours as needed. 07/14/21   [name], NP  albuterol (PROVENTIL) (2.5 MG/3ML) 0.083% nebulizer solution Take 3 mLs (2.5 mg total) by nebulization every 4 (four) hours as needed for wheezing or shortness of breath. 10/26/22   Kamie Korber, DO  albuterol (VENTOLIN HFA) 108 (90 Base) MCG/ACT inhaler Inhale 1-2 puffs into the lungs every 6 (six) hours as needed for wheezing or shortness of breath 08/07/22   Early, Sung Amabile, NP  buPROPion (WELLBUTRIN XL) 150 MG 24 hr tablet Take 1 tablet (150 mg total) by mouth every morning. 07/14/21   [name], NP  cyanocobalamin (,VITAMIN B-12,) 1000 MCG/ML injection Inject 1 mL (1,000 mcg total) into the muscle every 30 (thirty) days. 07/14/21   [name], NP  ferrous sulfate 325 (65 FE) MG EC tablet Take 1 tablet (325 mg total) by mouth every other day. 07/13/22   Rachel Moulds, MD  Fluticasone-Umeclidin-Vilant (TRELEGY ELLIPTA) 200-62.5-25 MCG/ACT AEPB Use 1 inhalation daily for asthma. 07/14/21   [name], NP  hydrOXYzine (ATARAX) 25 MG tablet Take 1-2 tablets (25-50 mg total) by mouth every 6 (six) hours as needed for anxiety. 07/14/21   [name], NP  loratadine (CLARITIN) 10 MG tablet Take 1 tablet (10 mg total) by mouth daily. 08/07/22   [name], NP  Vitamin D, Ergocalciferol, (DRISDOL) 1.25 MG (50000 UNIT) CAPS capsule Take 1 capsule (50,000 Units total) by mouth every 7 (seven) days. 07/26/21   [name], NP      Allergies    Peanut-containing drug products, Shellfish allergy, and Aleve [naproxen]    Review of Systems   Review of Systems  Physical Exam Updated Vital Signs BP (!) 119/100   Pulse 70   Temp 98 F (36.7 C) (Oral)   Resp (!) 22   Ht 5\' 3"  (1.6 m)   Wt 89.4 kg   SpO2 100%   BMI 34.90 kg/m  Physical Exam Vitals and nursing note reviewed.  Constitutional:      General: She is not in acute distress.    Appearance: She is well-developed. She is not ill-appearing.  HENT:     Head: Normocephalic and atraumatic.     Mouth/Throat:     Mouth: Mucous membranes are moist.  Eyes:     Extraocular Movements: Extraocular movements intact.     Conjunctiva/sclera: Conjunctivae normal.     Pupils: Pupils are equal, round, and reactive  to light.  Cardiovascular:     Rate and Rhythm: Normal rate and regular rhythm.     Pulses: Normal pulses.     Heart sounds: Normal heart sounds. No murmur heard. Pulmonary:     Effort: Pulmonary effort is normal. No respiratory distress.     Breath sounds: Wheezing present.  Abdominal:     Palpations: Abdomen is soft.     Tenderness: There is no abdominal tenderness.  Musculoskeletal:        General: No swelling.     Cervical back: Normal range of motion and neck supple.  Skin:    General: Skin is warm and dry.     Capillary Refill: Capillary refill takes less than 2 seconds.  Neurological:     General: No focal deficit present.     Mental Status: She is alert.  Psychiatric:        Mood and Affect: Mood normal.      ED Results / Procedures / Treatments   Labs (all labs ordered are listed, but only abnormal results are displayed) Labs Reviewed - No data to display  EKG None  Radiology No results found.  Procedures Procedures    Medications Ordered in ED Medications  albuterol (VENTOLIN HFA) 108 (90 Base) MCG/ACT inhaler 2 puff (has no administration in time range)  ipratropium-albuterol (DUONEB) 0.5-2.5 (3) MG/3ML nebulizer solution 3 mL (3 mLs Nebulization Given 10/26/22 0720)  predniSONE (DELTASONE) tablet 60 mg (60 mg Oral Given 10/26/22 4401)    ED Course/ Medical Decision Making/ A&P                                 Medical Decision Making Risk Prescription drug management.   Jill Summers is here with asthma exacerbation and for medication refill.  Having some wheezing and some asthma symptoms this morning but mild but does not have any inhaler or nebulizer solution.  She denies any fever chills or cough or sputum production.  She feels well otherwise.  She is in no signs of respiratory distress.  She has very faint wheezing on exam.  She looks very comfortable.  She is given DuoNeb treatment and prednisone with great improvement.  Overall suspect mild asthma exacerbation.  Patient was given facemask, albuterol inhaler refilled nebulizer solution sent to pharmacy as well as prednisone.  She has had her tubes tied in the past and she declined COVID or flu testing.  Overall discharged in good condition.  Understands return precautions.  This chart was dictated using voice recognition software.  Despite best efforts to proofread,  errors can occur which can change the documentation meaning.         Final Clinical Impression(s) / ED Diagnoses Final diagnoses:  Mild asthma without complication, unspecified whether persistent    Rx / DC Orders ED Discharge Orders          Ordered    predniSONE (DELTASONE) 20 MG tablet  Daily        10/26/22 0730    albuterol  (PROVENTIL) (2.5 MG/3ML) 0.083% nebulizer solution  Every 4 hours PRN        10/26/22 0730              Virgina Norfolk, DO 10/26/22 0732

## 2022-10-26 NOTE — ED Notes (Signed)
RT Note: Patient given a Duoneb post check in for SOB, Hx Asthma. Patient tolerated well and stated she could breath easier after the breathing treatment. Patient given an Albuterol inhaler with spacer to carry home as well

## 2022-10-26 NOTE — ED Triage Notes (Signed)
In for eval of SOB due to asthma. Onset 0400 this am. Unable to find her inhaler. Dry cough.

## 2022-11-03 ENCOUNTER — Ambulatory Visit: Payer: Medicare Other | Attending: Hematology and Oncology

## 2022-11-23 ENCOUNTER — Telehealth: Payer: Self-pay | Admitting: Hematology and Oncology

## 2022-11-23 NOTE — Telephone Encounter (Signed)
 Left patient a vm regarding upcoming appointment

## 2022-12-01 ENCOUNTER — Inpatient Hospital Stay: Payer: Medicare Other | Attending: Hematology and Oncology

## 2022-12-01 ENCOUNTER — Encounter: Payer: Self-pay | Admitting: Oncology

## 2022-12-01 DIAGNOSIS — E538 Deficiency of other specified B group vitamins: Secondary | ICD-10-CM | POA: Insufficient documentation

## 2022-12-01 DIAGNOSIS — R29898 Other symptoms and signs involving the musculoskeletal system: Secondary | ICD-10-CM

## 2022-12-01 DIAGNOSIS — G8220 Paraplegia, unspecified: Secondary | ICD-10-CM

## 2022-12-01 DIAGNOSIS — G63 Polyneuropathy in diseases classified elsewhere: Secondary | ICD-10-CM

## 2022-12-01 DIAGNOSIS — D61818 Other pancytopenia: Secondary | ICD-10-CM

## 2022-12-01 MED ORDER — CYANOCOBALAMIN 1000 MCG/ML IJ SOLN
1000.0000 ug | INTRAMUSCULAR | Status: DC
  Administered 2022-12-01: 1000 ug via INTRAMUSCULAR
  Filled 2022-12-01: qty 1

## 2022-12-04 ENCOUNTER — Telehealth: Payer: Self-pay | Admitting: Hematology and Oncology

## 2022-12-04 NOTE — Telephone Encounter (Signed)
Left patient a vm regarding upcoming appointment change  

## 2022-12-20 ENCOUNTER — Encounter: Payer: Self-pay | Admitting: Oncology

## 2022-12-27 ENCOUNTER — Inpatient Hospital Stay: Payer: Medicare Other | Admitting: Hematology and Oncology

## 2022-12-27 ENCOUNTER — Inpatient Hospital Stay: Payer: Medicare Other

## 2022-12-27 ENCOUNTER — Other Ambulatory Visit: Payer: Medicare Other

## 2022-12-29 ENCOUNTER — Inpatient Hospital Stay: Payer: Medicare Other

## 2022-12-29 ENCOUNTER — Ambulatory Visit: Payer: Medicare Other | Admitting: Adult Health

## 2023-01-01 ENCOUNTER — Encounter: Payer: Self-pay | Admitting: Oncology

## 2023-01-04 ENCOUNTER — Other Ambulatory Visit: Payer: Self-pay | Admitting: *Deleted

## 2023-01-04 DIAGNOSIS — D509 Iron deficiency anemia, unspecified: Secondary | ICD-10-CM

## 2023-01-05 ENCOUNTER — Ambulatory Visit (HOSPITAL_COMMUNITY)
Admission: RE | Admit: 2023-01-05 | Discharge: 2023-01-05 | Disposition: A | Discharge status: Home / Self Care | Payer: Medicare Other | Source: Ambulatory Visit | Attending: Adult Health | Admitting: Adult Health

## 2023-01-05 ENCOUNTER — Encounter: Payer: Self-pay | Admitting: Adult Health

## 2023-01-05 ENCOUNTER — Encounter: Payer: Self-pay | Admitting: Oncology

## 2023-01-05 ENCOUNTER — Inpatient Hospital Stay (HOSPITAL_BASED_OUTPATIENT_CLINIC_OR_DEPARTMENT_OTHER): Payer: Medicare Other | Admitting: Adult Health

## 2023-01-05 ENCOUNTER — Other Ambulatory Visit (HOSPITAL_BASED_OUTPATIENT_CLINIC_OR_DEPARTMENT_OTHER): Payer: Self-pay

## 2023-01-05 ENCOUNTER — Inpatient Hospital Stay: Payer: Medicare Other

## 2023-01-05 ENCOUNTER — Inpatient Hospital Stay: Payer: Medicare Other | Attending: Hematology and Oncology

## 2023-01-05 VITALS — BP 126/75 | HR 79 | Temp 97.9°F | Resp 18 | Ht 63.0 in | Wt 210.6 lb

## 2023-01-05 DIAGNOSIS — R29898 Other symptoms and signs involving the musculoskeletal system: Secondary | ICD-10-CM

## 2023-01-05 DIAGNOSIS — D509 Iron deficiency anemia, unspecified: Secondary | ICD-10-CM | POA: Diagnosis not present

## 2023-01-05 DIAGNOSIS — J4551 Severe persistent asthma with (acute) exacerbation: Secondary | ICD-10-CM

## 2023-01-05 DIAGNOSIS — D61818 Other pancytopenia: Secondary | ICD-10-CM

## 2023-01-05 DIAGNOSIS — E538 Deficiency of other specified B group vitamins: Secondary | ICD-10-CM

## 2023-01-05 DIAGNOSIS — N920 Excessive and frequent menstruation with regular cycle: Secondary | ICD-10-CM | POA: Diagnosis not present

## 2023-01-05 DIAGNOSIS — G63 Polyneuropathy in diseases classified elsewhere: Secondary | ICD-10-CM

## 2023-01-05 DIAGNOSIS — G8220 Paraplegia, unspecified: Secondary | ICD-10-CM

## 2023-01-05 DIAGNOSIS — R062 Wheezing: Secondary | ICD-10-CM | POA: Diagnosis not present

## 2023-01-05 DIAGNOSIS — R059 Cough, unspecified: Secondary | ICD-10-CM | POA: Diagnosis not present

## 2023-01-05 DIAGNOSIS — R0602 Shortness of breath: Secondary | ICD-10-CM | POA: Diagnosis not present

## 2023-01-05 LAB — CMP (CANCER CENTER ONLY)
ALT: 11 U/L (ref 0–44)
AST: 14 U/L — ABNORMAL LOW (ref 15–41)
Albumin: 4 g/dL (ref 3.5–5.0)
Alkaline Phosphatase: 36 U/L — ABNORMAL LOW (ref 38–126)
Anion gap: 7 (ref 5–15)
BUN: 10 mg/dL (ref 6–20)
CO2: 25 mmol/L (ref 22–32)
Calcium: 9 mg/dL (ref 8.9–10.3)
Chloride: 108 mmol/L (ref 98–111)
Creatinine: 0.85 mg/dL (ref 0.44–1.00)
GFR, Estimated: >60 mL/min (ref >60)
Glucose, Bld: 83 mg/dL (ref 70–99)
Potassium: 3.8 mmol/L (ref 3.5–5.1)
Sodium: 140 mmol/L (ref 135–145)
Total Bilirubin: 0.6 mg/dL (ref <1.2)
Total Protein: 7 g/dL (ref 6.5–8.1)

## 2023-01-05 LAB — IRON AND IRON BINDING CAPACITY (CC-WL,HP ONLY)
Iron: 92 ug/dL (ref 28–170)
Saturation Ratios: 26 % (ref 10.4–31.8)
TIBC: 353 ug/dL (ref 250–450)
UIBC: 261 ug/dL (ref 148–442)

## 2023-01-05 LAB — FERRITIN: Ferritin: 24 ng/mL (ref 11–307)

## 2023-01-05 LAB — CBC WITH DIFFERENTIAL (CANCER CENTER ONLY)
Abs Immature Granulocytes: 0.01 10*3/uL (ref 0.00–0.07)
Basophils Absolute: 0.1 10*3/uL (ref 0.0–0.1)
Basophils Relative: 1 %
Eosinophils Absolute: 0.5 10*3/uL (ref 0.0–0.5)
Eosinophils Relative: 8 %
HCT: 40.5 % (ref 36.0–46.0)
Hemoglobin: 14.4 g/dL (ref 12.0–15.0)
Immature Granulocytes: 0 %
Lymphocytes Relative: 33 %
Lymphs Abs: 2 10*3/uL (ref 0.7–4.0)
MCH: 32.1 pg (ref 26.0–34.0)
MCHC: 35.6 g/dL (ref 30.0–36.0)
MCV: 90.4 fL (ref 80.0–100.0)
Monocytes Absolute: 0.4 10*3/uL (ref 0.1–1.0)
Monocytes Relative: 7 %
Neutro Abs: 3.1 10*3/uL (ref 1.7–7.7)
Neutrophils Relative %: 51 %
Platelet Count: 231 10*3/uL (ref 150–400)
RBC: 4.48 MIL/uL (ref 3.87–5.11)
RDW: 13.1 % (ref 11.5–15.5)
WBC Count: 6.1 10*3/uL (ref 4.0–10.5)
nRBC: 0 % (ref 0.0–0.2)

## 2023-01-05 LAB — VITAMIN B12: Vitamin B-12: 155 pg/mL — ABNORMAL LOW (ref 180–914)

## 2023-01-05 MED ORDER — CYANOCOBALAMIN 1000 MCG/ML IJ SOLN
1000.0000 ug | INTRAMUSCULAR | Status: DC
  Administered 2023-01-05: 1000 ug via INTRAMUSCULAR
  Filled 2023-01-05: qty 1

## 2023-01-05 MED ORDER — FERROUS SULFATE 325 (65 FE) MG PO TBEC
325.0000 mg | DELAYED_RELEASE_TABLET | ORAL | 3 refills | Status: DC
  Filled 2023-01-05: qty 30, 60d supply, fill #0
  Filled 2023-01-05 – 2023-01-08 (×2): qty 100, 200d supply, fill #0

## 2023-01-05 MED ORDER — PREDNISONE 10 MG (21) PO TBPK
ORAL_TABLET | ORAL | 0 refills | Status: DC
  Filled 2023-01-05: qty 21, 6d supply, fill #0

## 2023-01-05 MED ORDER — DOCUSATE SODIUM 100 MG PO CAPS
100.0000 mg | ORAL_CAPSULE | Freq: Two times a day (BID) | ORAL | 3 refills | Status: DC
  Filled 2023-01-05 (×2): qty 200, 100d supply, fill #0
  Filled 2023-01-08: qty 100, 50d supply, fill #0

## 2023-01-05 NOTE — Patient Instructions (Signed)
Iron Sucrose Injection What is this medication? IRON SUCROSE (EYE ern SOO krose) treats low levels of iron (iron deficiency anemia) in people with kidney disease. Iron is a mineral that plays an important role in making red blood cells, which carry oxygen from your lungs to the rest of your body. This medicine may be used for other purposes; ask your health care provider or pharmacist if you have questions. COMMON BRAND NAME(S): Venofer What should I tell my care team before I take this medication? They need to know if you have any of these conditions: Anemia not caused by low iron levels Heart disease High levels of iron in the blood Kidney disease Liver disease An unusual or allergic reaction to iron, other medications, foods, dyes, or preservatives Pregnant or trying to get pregnant Breastfeeding How should I use this medication? This medication is for infusion into a vein. It is given in a hospital or clinic setting. Talk to your care team about the use of this medication in children. While this medication may be prescribed for children as young as 2 years for selected conditions, precautions do apply. Overdosage: If you think you have taken too much of this medicine contact a poison control center or emergency room at once. NOTE: This medicine is only for you. Do not share this medicine with others. What if I miss a dose? Keep appointments for follow-up doses. It is important not to miss your dose. Call your care team if you are unable to keep an appointment. What may interact with this medication? Do not take this medication with any of the following: Deferoxamine Dimercaprol Other iron products This medication may also interact with the following: Chloramphenicol Deferasirox This list may not describe all possible interactions. Give your health care provider a list of all the medicines, herbs, non-prescription drugs, or dietary supplements you use. Also tell them if you smoke,  drink alcohol, or use illegal drugs. Some items may interact with your medicine. What should I watch for while using this medication? Visit your care team regularly. Tell your care team if your symptoms do not start to get better or if they get worse. You may need blood work done while you are taking this medication. You may need to follow a special diet. Talk to your care team. Foods that contain iron include: whole grains/cereals, dried fruits, beans, or peas, leafy green vegetables, and organ meats (liver, kidney). What side effects may I notice from receiving this medication? Side effects that you should report to your care team as soon as possible: Allergic reactions--skin rash, itching, hives, swelling of the face, lips, tongue, or throat Low blood pressure--dizziness, feeling faint or lightheaded, blurry vision Shortness of breath Side effects that usually do not require medical attention (report to your care team if they continue or are bothersome): Flushing Headache Joint pain Muscle pain Nausea Pain, redness, or irritation at injection site This list may not describe all possible side effects. Call your doctor for medical advice about side effects. You may report side effects to FDA at 1-800-FDA-1088. Where should I keep my medication? This medication is given in a hospital or clinic. It will not be stored at home. NOTE: This sheet is a summary. It may not cover all possible information. If you have questions about this medicine, talk to your doctor, pharmacist, or health care provider.  2024 Elsevier/Gold Standard (2022-06-23 00:00:00)

## 2023-01-08 ENCOUNTER — Other Ambulatory Visit (HOSPITAL_BASED_OUTPATIENT_CLINIC_OR_DEPARTMENT_OTHER): Payer: Self-pay

## 2023-01-08 ENCOUNTER — Telehealth: Payer: Self-pay

## 2023-01-08 ENCOUNTER — Encounter: Payer: Self-pay | Admitting: Oncology

## 2023-01-08 NOTE — Telephone Encounter (Addendum)
Called pt per Np regarding message below. Pt verbalized understanding.----- Message from Noreene Filbert sent at 01/08/2023  2:07 PM EST ----- Please tell Jill Summers that her labs are good however her B12 level is low due to missing a few injections.  Please recommend that she keep her appointments as scheduled. ----- Message ----- From: Jill Summers, Lab In [place] Sent: 01/05/2023  12:24 PM EST To: Loa Socks, NP

## 2023-01-09 ENCOUNTER — Other Ambulatory Visit (HOSPITAL_BASED_OUTPATIENT_CLINIC_OR_DEPARTMENT_OTHER): Payer: Self-pay

## 2023-01-10 ENCOUNTER — Encounter: Payer: Self-pay | Admitting: Oncology

## 2023-01-10 NOTE — Assessment & Plan Note (Signed)
Asthma   Increased frequency of symptoms requiring use of inhaler and breathing treatments. Recent ER visit.   -Order chest x-ray to rule out underlying issues.   -Send in prescription for prednisone.   -Refer back to primary care for follow up and to ? Add steroid inhaler

## 2023-01-10 NOTE — Assessment & Plan Note (Addendum)
Iron Deficiency Anemia   Low ferritin levels. Patient tolerates iron infusions but finds them inconvenient. Not currently taking oral iron.   -Order oral iron for daily use.   -Order stool softener to manage potential constipation from oral iron.   -Labs pending at completion of today's visit.   Menorrhagia   Heavy menstrual flow for the first two days of the cycle, potentially contributing to iron deficiency.   -Continue monitoring menstrual cycle and iron levels.

## 2023-01-10 NOTE — Progress Notes (Signed)
[facility] Cancer Follow up:    Jill Eth, NP [address] [place] [address]   DIAGNOSIS: b12 deficiency, Iron deficiency  SUMMARY OF HEMATOLOGIC HISTORY:  [place] woman presenting 11/15/2013 with inability to walk, fever and pancytopenia, found to be severely B-12 deficient.   (1) B-12 deficiency: will need lifelong replacement; as patient does not have PCP this can be done through our office             (a) received monthly (misses doses on occasion)   (2) subacute combined degeneration - Stable   (3) iron deficiency:09/25/2022 venofer 300mg  x 2 (most recent)  CURRENT THERAPY: Intermittent IV iron, b12 injections  INTERVAL HISTORY:  Discussed the use of AI scribe software for clinical note transcription with the patient, who gave verbal consent to proceed.  Jill Summers 37 y.o. female with a history of asthma and iron deficiency anemia, presents with complaints of persistent imbalance and worsening asthma symptoms. She reports that her balance has been off, but she has not experienced any falls recently. She has been receiving monthly B12 injections for a previously diagnosed B12 deficiency and has noticed some improvement in her balance.  In addition to the balance issues, the patient has been struggling with her asthma. She has been using a breathing treatment and an inhaler every two hours for the past month. She visited the emergency room a month ago due to her asthma symptoms. She denies any exposure to smoke.  The patient also has a history of iron deficiency anemia. She has been receiving iron infusions, which she tolerates well, but finds the two-hour duration of the infusions inconvenient. She reports that her iron levels are always low. She has not been taking oral iron daily, but is open to starting it as a maintenance therapy to potentially avoid the lengthy iron infusions. She is aware of the potential side effects of oral  iron, including nausea and constipation, and has requested a stool softener to manage potential constipation.  The patient's menstrual cycles are regular, occurring every 28 days and lasting 4-5 days. The first two days are characterized by a heavier flow, requiring 4-5 tampon or pad changes per day. The flow decreases on days 3-4.   Patient Active Problem List   Diagnosis Date Noted   Severe persistent asthma 07/29/2021   Urticaria of unknown origin 07/29/2021   Iron deficiency anemia 02/24/2016   Constipation 01/06/2014   Major depressive disorder, recurrent, in remission (HCC)    History of paraplegia 12/19/2013   Polyneuropathy due to vitamin B deficiency (HCC) 12/19/2013   Vitamin B 12 deficiency 12/18/2013   Pancytopenia (HCC) 12/15/2013   MRSA (methicillin resistant staph aureus) culture positive 07/27/2013    is allergic to peanut-containing drug products, shellfish allergy, and aleve [naproxen].  MEDICAL HISTORY: Past Medical History:  Diagnosis Date   Adopted    Asthma    since baby   Bacterial vaginosis    Chlamydia    Depression    At age 66   MRSA infection    Obesity    Urinary tract infection     SURGICAL HISTORY: Past Surgical History:  Procedure Laterality Date   CESAREAN SECTION     CESAREAN SECTION WITH BILATERAL TUBAL LIGATION      SOCIAL HISTORY: Social History   Socioeconomic History   Marital status: Single    Spouse name: Not on file   Number of children: Not on file   Years  of education: Not on file   Highest education level: Not on file  Occupational History   Not on file  Tobacco Use   Smoking status: Some Days    Types: Cigars    Passive exposure: Never   Smokeless tobacco: Never  Vaping Use   Vaping status: Never Used  Substance and Sexual Activity   Alcohol use: No    Alcohol/week: 2.0 standard drinks of alcohol    Types: 2 Standard drinks or equivalent per week   Drug use: Yes    Types: Marijuana    Comment: Rolled in  tobacco   Sexual activity: Yes    Birth control/protection: Surgical  Other Topics Concern   Not on file  Social History Narrative   Not on file   Social Determinants of Health   Financial Resource Strain: Not on file  Food Insecurity: Not on file  Transportation Needs: Not on file  Physical Activity: Not on file  Stress: Not on file  Social Connections: Not on file  Intimate Partner Violence: Not on file    FAMILY HISTORY: Family History  Adopted: Yes    Review of Systems  Constitutional:  Positive for fatigue. Negative for appetite change, chills, fever and unexpected weight change.  HENT:   Negative for hearing loss, lump/mass and trouble swallowing.   Eyes:  Negative for eye problems and icterus.  Respiratory:  Negative for chest tightness, cough and shortness of breath.   Cardiovascular:  Negative for chest pain, leg swelling and palpitations.  Gastrointestinal:  Negative for abdominal distention, abdominal pain, constipation, diarrhea, nausea and vomiting.  Endocrine: Negative for hot flashes.  Genitourinary:  Negative for difficulty urinating.   Musculoskeletal:  Negative for arthralgias.  Skin:  Negative for itching and rash.  Neurological:  Negative for dizziness, extremity weakness, headaches and numbness.  Hematological:  Negative for adenopathy. Does not bruise/bleed easily.  Psychiatric/Behavioral:  Negative for depression. The patient is not nervous/anxious.       PHYSICAL EXAMINATION    Vitals:   01/05/23 1227  BP: 126/75  Pulse: 79  Resp: 18  Temp: 97.9 F (36.6 C)  SpO2: 100%    Physical Exam Constitutional:      General: She is not in acute distress.    Appearance: Normal appearance. She is not toxic-appearing.  HENT:     Head: Normocephalic and atraumatic.     Mouth/Throat:     Mouth: Mucous membranes are moist.     Pharynx: Oropharynx is clear. No oropharyngeal exudate or posterior oropharyngeal erythema.  Eyes:     General: No  scleral icterus. Cardiovascular:     Rate and Rhythm: Normal rate and regular rhythm.     Pulses: Normal pulses.     Heart sounds: Normal heart sounds.  Pulmonary:     Effort: Pulmonary effort is normal.     Breath sounds: Wheezing (occasional) present.  Abdominal:     General: Abdomen is flat. Bowel sounds are normal. There is no distension.     Palpations: Abdomen is soft.     Tenderness: There is no abdominal tenderness.  Musculoskeletal:        General: No swelling.     Cervical back: Neck supple.  Lymphadenopathy:     Cervical: No cervical adenopathy.  Skin:    General: Skin is warm and dry.     Findings: No rash.  Neurological:     General: No focal deficit present.     Mental Status: She is alert.  Psychiatric:        Mood and Affect: Mood normal.        Behavior: Behavior normal.     LABORATORY DATA:  CBC    Component Value Date/Time   WBC 6.1 01/05/2023 1206   WBC 5.4 02/03/2022 0829   RBC 4.48 01/05/2023 1206   HGB 14.4 01/05/2023 1206   HGB 12.5 07/14/2021 1627   HGB 12.3 01/20/2016 1404   HCT 40.5 01/05/2023 1206   HCT 42.1 02/03/2022 1002   HCT 37.1 01/20/2016 1404   PLT 231 01/05/2023 1206   PLT 246 07/14/2021 1627   MCV 90.4 01/05/2023 1206   MCV 82 07/14/2021 1627   MCV 85.5 01/20/2016 1404   MCH 32.1 01/05/2023 1206   MCHC 35.6 01/05/2023 1206   RDW 13.1 01/05/2023 1206   RDW 22.1 (H) 07/14/2021 1627   RDW 14.1 01/20/2016 1404   LYMPHSABS 2.0 01/05/2023 1206   LYMPHSABS 2.4 07/14/2021 1627   LYMPHSABS 2.8 01/20/2016 1404   MONOABS 0.4 01/05/2023 1206   MONOABS 0.2 01/20/2016 1404   EOSABS 0.5 01/05/2023 1206   EOSABS 0.2 07/14/2021 1627   BASOSABS 0.1 01/05/2023 1206   BASOSABS 0.0 07/14/2021 1627   BASOSABS 0.0 01/20/2016 1404    CMP     Component Value Date/Time   NA 140 01/05/2023 1206   NA 139 07/14/2021 1627   NA 140 02/04/2015 1049   K 3.8 01/05/2023 1206   K 3.9 02/04/2015 1049   CL 108 01/05/2023 1206   CO2 25  01/05/2023 1206   CO2 23 02/04/2015 1049   GLUCOSE 83 01/05/2023 1206   GLUCOSE 98 02/04/2015 1049   BUN 10 01/05/2023 1206   BUN 9 07/14/2021 1627   BUN 9.8 02/04/2015 1049   CREATININE 0.85 01/05/2023 1206   CREATININE 0.9 02/04/2015 1049   CALCIUM 9.0 01/05/2023 1206   CALCIUM 8.8 02/04/2015 1049   PROT 7.0 01/05/2023 1206   PROT 6.8 07/14/2021 1627   PROT 6.9 02/04/2015 1049   ALBUMIN 4.0 01/05/2023 1206   ALBUMIN 4.0 07/14/2021 1627   ALBUMIN 3.4 (L) 02/04/2015 1049   AST 14 (L) 01/05/2023 1206   AST 13 02/04/2015 1049   ALT 11 01/05/2023 1206   ALT <9 02/04/2015 1049   ALKPHOS 36 (L) 01/05/2023 1206   ALKPHOS 53 02/04/2015 1049   BILITOT 0.6 01/05/2023 1206   BILITOT 0.47 02/04/2015 1049   GFRNONAA >60 01/05/2023 1206   GFRAA >60 11/05/2018 1036   GFRAA >60 01/29/2018 0831     ASSESSMENT and THERAPY PLAN:   Iron deficiency anemia Iron Deficiency Anemia   Low ferritin levels. Patient tolerates iron infusions but finds them inconvenient. Not currently taking oral iron.   -Order oral iron for daily use.   -Order stool softener to manage potential constipation from oral iron.   -Labs pending at completion of today's visit.   Menorrhagia   Heavy menstrual flow for the first two days of the cycle, potentially contributing to iron deficiency.   -Continue monitoring menstrual cycle and iron levels.    Severe persistent asthma Asthma   Increased frequency of symptoms requiring use of inhaler and breathing treatments. Recent ER visit.   -Order chest x-ray to rule out underlying issues.   -Send in prescription for prednisone.   -Refer back to primary care for follow up and to ? Add steroid inhaler    Vitamin B 12 deficiency B12 Deficiency Continues on b12 injections.  Misses doses occasionally. -Encourage continued Vitamin  B12 injections monthly -Prescription B12 offered to her to pick up at pharmacy, however patient prefers coming into office for b12 injections  which we are happy to accommodate.  -Recheck b12 levels today.  RTC every 4 weeks for labs and injection, see provider in 12-16 weeks.   All questions were answered. The patient knows to call the clinic with any problems, questions or concerns. We can certainly see the patient much sooner if necessary.  Total encounter time:30 minutes*in face-to-face visit time, chart review, lab review, care coordination, order entry, and documentation of the encounter time.  Lillard Anes, NP 01/10/23 11:53 PM Medical Oncology and Hematology [facility] [address] [place], [place] [address] Tel. [phone]    Fax. [phone]  *Total Encounter Time as defined by the Centers for Medicare and Medicaid Services includes, in addition to the face-to-face time of a patient visit (documented in the note above) non-face-to-face time: obtaining and reviewing outside history, ordering and reviewing medications, tests or procedures, care coordination (communications with other health care professionals or caregivers) and documentation in the medical record.

## 2023-01-10 NOTE — Assessment & Plan Note (Signed)
B12 Deficiency Continues on b12 injections.  Misses doses occasionally. -Encourage continued Vitamin B12 injections monthly -Prescription B12 offered to her to pick up at pharmacy, however patient prefers coming into office for b12 injections which we are happy to accommodate.  -Recheck b12 levels today.

## 2023-01-11 ENCOUNTER — Ambulatory Visit (INDEPENDENT_AMBULATORY_CARE_PROVIDER_SITE_OTHER): Payer: Medicare Other | Admitting: Nurse Practitioner

## 2023-01-11 ENCOUNTER — Encounter: Payer: Self-pay | Admitting: Nurse Practitioner

## 2023-01-11 ENCOUNTER — Other Ambulatory Visit (HOSPITAL_BASED_OUTPATIENT_CLINIC_OR_DEPARTMENT_OTHER): Payer: Self-pay

## 2023-01-11 VITALS — BP 124/82 | HR 72 | Wt 213.6 lb

## 2023-01-11 DIAGNOSIS — J4550 Severe persistent asthma, uncomplicated: Secondary | ICD-10-CM

## 2023-01-11 DIAGNOSIS — K5901 Slow transit constipation: Secondary | ICD-10-CM

## 2023-01-11 DIAGNOSIS — G63 Polyneuropathy in diseases classified elsewhere: Secondary | ICD-10-CM

## 2023-01-11 DIAGNOSIS — D61818 Other pancytopenia: Secondary | ICD-10-CM | POA: Diagnosis not present

## 2023-01-11 DIAGNOSIS — D508 Other iron deficiency anemias: Secondary | ICD-10-CM | POA: Diagnosis not present

## 2023-01-11 DIAGNOSIS — E539 Vitamin B deficiency, unspecified: Secondary | ICD-10-CM | POA: Diagnosis not present

## 2023-01-11 DIAGNOSIS — E538 Deficiency of other specified B group vitamins: Secondary | ICD-10-CM | POA: Diagnosis not present

## 2023-01-11 DIAGNOSIS — F3340 Major depressive disorder, recurrent, in remission, unspecified: Secondary | ICD-10-CM | POA: Diagnosis not present

## 2023-01-11 MED ORDER — ALBUTEROL SULFATE HFA 108 (90 BASE) MCG/ACT IN AERS
1.0000 | INHALATION_SPRAY | Freq: Four times a day (QID) | RESPIRATORY_TRACT | 6 refills | Status: AC | PRN
  Filled 2023-01-11: qty 6.7, 25d supply, fill #0
  Filled 2023-03-01: qty 13.4, 50d supply, fill #0
  Filled 2023-06-03: qty 20.1, 75d supply, fill #1
  Filled 2023-08-24: qty 20.1, 75d supply, fill #2
  Filled 2023-10-23: qty 20.1, 75d supply, fill #3

## 2023-01-11 MED ORDER — ALBUTEROL SULFATE (2.5 MG/3ML) 0.083% IN NEBU
2.5000 mg | INHALATION_SOLUTION | RESPIRATORY_TRACT | 12 refills | Status: AC | PRN
  Filled 2023-01-11: qty 150, 30d supply, fill #0

## 2023-01-11 MED ORDER — TRELEGY ELLIPTA 200-62.5-25 MCG/ACT IN AEPB
INHALATION_SPRAY | RESPIRATORY_TRACT | 6 refills | Status: DC
  Filled 2023-01-11 – 2023-03-01 (×2): qty 60, 30d supply, fill #0
  Filled 2023-06-03: qty 60, 30d supply, fill #1
  Filled 2023-07-27: qty 60, 30d supply, fill #2
  Filled 2023-08-24: qty 60, 30d supply, fill #3
  Filled 2023-10-23: qty 60, 30d supply, fill #4

## 2023-01-11 NOTE — Patient Instructions (Signed)
Asthma, Adult  Asthma is a long-term (chronic) condition that causes recurrent episodes in which the lower airways in the lungs become tight and narrow. The narrowing is caused by inflammation and tightening of the smooth muscle around the lower airways. Asthma episodes, also called asthma attacks or asthma flares, may cause coughing, making high-pitched whistling sounds when you breathe, most often when you breathe out (wheezing), shortness of breath, and chest pain. The airways may produce extra mucus caused by the inflammation and irritation. During an attack, it can be difficult to breathe. Asthma attacks can range from minor to life-threatening. Asthma cannot be cured, but medicines and lifestyle changes can help control it and treat acute attacks. It is important to keep your asthma well controlled so the condition does not interfere with your daily life. What are the causes? This condition is believed to be caused by inherited (genetic) and environmental factors, but its exact cause is not known. What can trigger an asthma attack? Many things can bring on an asthma attack or make symptoms worse. These triggers are different for every person. Common triggers include: Allergens and irritants like mold, dust, pet dander, cockroaches, pollen, air pollution, and chemical odors. Cigarette smoke. Weather changes and cold air. Stress and strong emotional responses such as crying or laughing hard. Certain medications such as aspirin or beta blockers. Infections and inflammatory conditions, such as the flu, a cold, pneumonia, or inflammation of the nasal membranes (rhinitis). Gastroesophageal reflux disease (GERD). What are the signs or symptoms? Symptoms may occur right after exposure to an asthma trigger or hours later and can vary by person. Common signs and symptoms include: Wheezing. Trouble breathing (shortness of breath). Excessive nighttime or early morning coughing. Chest  tightness. Tiredness (fatigue) with minimal activity. Difficulty talking in complete sentences. Poor exercise tolerance. How is this diagnosed? This condition is diagnosed based on: A physical exam and your medical history. Tests, which may include: Lung function studies to evaluate the flow of air in your lungs. Allergy tests. Imaging tests, such as X-rays. How is this treated? There is no cure, but symptoms can be controlled with proper treatment. Treatment usually involves: Identifying and avoiding your asthma triggers. Inhaled medicines. Two types are commonly used to treat asthma, depending on severity: Controller medicines. These help prevent asthma symptoms from occurring. They are taken every day. Fast-acting reliever or rescue medicines. These quickly relieve asthma symptoms. They are used as needed and provide short-term relief. Using other medicines, such as: Allergy medicines, such as antihistamines, if your asthma attacks are triggered by allergens. Immune medicines (immunomodulators). These are medicines that help control the immune system. Using supplemental oxygen. This is only needed during a severe episode. Creating an asthma action plan. An asthma action plan is a written plan for managing and treating your asthma attacks. This plan includes: A list of your asthma triggers and how to avoid them. Information about when medicines should be taken and when their dosage should be changed. Instructions about using a device called a peak flow meter. A peak flow meter measures how well the lungs are working and the severity of your asthma. It helps you monitor your condition. Follow these instructions at home: Take over-the-counter and prescription medicines only as told by your health care provider. Stay up to date on all vaccinations as recommended by your healthcare provider, including vaccines for the flu and pneumonia. Use a peak flow meter and keep track of your peak flow  readings. Understand and use your asthma   action plan to address any asthma flares. Do not smoke or allow anyone to smoke in your home. Contact a health care provider if: You have wheezing, shortness of breath, or a cough that is not responding to medicines. Your medicines are causing side effects, such as a rash, itching, swelling, or trouble breathing. You need to use a reliever medicine more than 2-3 times a week. Your peak flow reading is still at 50-79% of your personal best after following your action plan for 1 hour. You have a fever and shortness of breath. Get help right away if: You are getting worse and do not respond to treatment during an asthma attack. You are short of breath when at rest or when doing very little physical activity. You have difficulty eating, drinking, or talking. You have chest pain or tightness. You develop a fast heartbeat or palpitations. You have a bluish color to your lips or fingernails. You are light-headed or dizzy, or you faint. Your peak flow reading is less than 50% of your personal best. You feel too tired to breathe normally. These symptoms may be an emergency. Get help right away. Call 911. Do not wait to see if the symptoms will go away. Do not drive yourself to the hospital. Summary Asthma is a long-term (chronic) condition that causes recurrent episodes in which the airways become tight and narrow. Asthma episodes, also called asthma attacks or asthma flares, can cause coughing, wheezing, shortness of breath, and chest pain. Asthma cannot be cured, but medicines and lifestyle changes can help keep it well controlled and prevent asthma flares. Make sure you understand how to avoid triggers and how and when to use your medicines. Asthma attacks can range from minor to life-threatening. Get help right away if you have an asthma attack and do not respond to treatment with your usual rescue medicines. This information is not intended to replace  advice given to you by your health care provider. Make sure you discuss any questions you have with your health care provider. Document Revised: 11/03/2020 Document Reviewed: 10/25/2020 [employer]vier Patient Education  2024 [employer]  

## 2023-01-11 NOTE — Progress Notes (Signed)
Shawna Clamp, DNP, AGNP-c [facility]  [address] [place], [place] [address] [phone]  ESTABLISHED PATIENT- Chronic Health and/or Follow-Up Visit  Blood pressure 124/82, pulse 72, weight 213 lb 9.6 oz (96.9 kg).    Jill Summers is a 37 y.o. year old female presenting today for evaluation and management of chronic conditions.   The patient, with a history of asthma and iron deficiency, presents with worsening asthma symptoms over the past six weeks. She reports frequent use of her albuterol inhaler, more than three times a day, and waking up at night due to symptoms. The patient also mentions needing to use a nebulizer frequently, with activities such as sweeping the floor leading to wheezing. She denies any recent changes in her environment or exposure to new allergens. The patient has a history of severe allergies but is unsure of the cause of the recent exacerbation of her asthma.  The patient was recently prescribed a course of steroids by an oncology nurse practitioner, which she reports has significantly improved her symptoms. She has not been using any other asthma medications recently. The patient also reports that her iron deficiency has improved and she is now on oral iron supplements.  All ROS negative with exception of what is listed above.   PHYSICAL EXAM Physical Exam Vitals and nursing note reviewed.  Constitutional:      Appearance: Normal appearance.  HENT:     Head: Normocephalic.  Eyes:     Pupils: Pupils are equal, round, and reactive to light.  Cardiovascular:     Rate and Rhythm: Normal rate and regular rhythm.     Pulses: Normal pulses.     Heart sounds: Normal heart sounds.  Pulmonary:     Effort: Pulmonary effort is normal.     Breath sounds: Normal breath sounds. No wheezing.  Musculoskeletal:        General: Normal range of motion.     Cervical back: Normal range of motion.  Skin:    General: Skin is warm.   Neurological:     General: No focal deficit present.     Mental Status: She is alert and oriented to person, place, and time.  Psychiatric:        Mood and Affect: Mood normal.        Behavior: Behavior normal.      PLAN  Assessment & Plan Asthma Asthma exacerbation over the last 40 days with increased albuterol use (>3 times/day) and nocturnal symptoms. No new environmental changes identified. Current prednisone course has shown improvement. Non-adherence to Trelegy due to insurance issues. Discussed need for daily steroid inhaler for maintenance therapy to reduce inflammation and rescue inhaler use. Explained inhaler contains long-acting bronchodilator and steroid, and importance of rinsing mouth post-use to prevent oral thrush. Potential pulmonology referral if symptoms persist. - Prescribe daily steroid inhaler for maintenance therapy. - Instruct on proper inhaler use, including rinsing mouth post-use. - Continue current prednisone course. - Send prescription to [facility] and handle prior authorization if needed. - Provide albuterol inhaler and nebulizer for rescue use. - Schedule follow-up in one month to assess control and consider pulmonology referral if symptoms persist.  Iron Deficiency Anemia Managed with oral iron supplements. Recent results indicate no need for further iron treatments. - Continue oral iron supplementation. - Monitor iron levels as needed.  General Health Maintenance Request for wellness check/physical exam. - Schedule physical exam in one month.  Follow-up - Schedule follow-up in one month to evaluate asthma control  and perform physical exam. - Instruct to call if symptoms worsen or do not improve.   Shawna Clamp, DNP, AGNP-c

## 2023-01-18 ENCOUNTER — Other Ambulatory Visit (HOSPITAL_BASED_OUTPATIENT_CLINIC_OR_DEPARTMENT_OTHER): Payer: Self-pay

## 2023-01-19 ENCOUNTER — Other Ambulatory Visit (HOSPITAL_BASED_OUTPATIENT_CLINIC_OR_DEPARTMENT_OTHER): Payer: Self-pay

## 2023-01-22 ENCOUNTER — Inpatient Hospital Stay: Payer: Medicare Other

## 2023-02-02 ENCOUNTER — Inpatient Hospital Stay: Payer: Medicare Other | Attending: Hematology and Oncology

## 2023-02-02 DIAGNOSIS — E538 Deficiency of other specified B group vitamins: Secondary | ICD-10-CM | POA: Insufficient documentation

## 2023-02-05 ENCOUNTER — Other Ambulatory Visit (HOSPITAL_BASED_OUTPATIENT_CLINIC_OR_DEPARTMENT_OTHER): Payer: Self-pay

## 2023-02-06 ENCOUNTER — Other Ambulatory Visit (HOSPITAL_BASED_OUTPATIENT_CLINIC_OR_DEPARTMENT_OTHER): Payer: Self-pay

## 2023-02-09 ENCOUNTER — Other Ambulatory Visit (HOSPITAL_BASED_OUTPATIENT_CLINIC_OR_DEPARTMENT_OTHER): Payer: Self-pay

## 2023-02-22 ENCOUNTER — Encounter: Payer: Self-pay | Admitting: Oncology

## 2023-03-01 ENCOUNTER — Encounter: Payer: Self-pay | Admitting: Oncology

## 2023-03-01 ENCOUNTER — Other Ambulatory Visit (HOSPITAL_BASED_OUTPATIENT_CLINIC_OR_DEPARTMENT_OTHER): Payer: Self-pay

## 2023-03-01 ENCOUNTER — Other Ambulatory Visit: Payer: Self-pay

## 2023-03-01 ENCOUNTER — Encounter: Payer: Self-pay | Admitting: Nurse Practitioner

## 2023-03-01 ENCOUNTER — Ambulatory Visit: Payer: Medicare Other | Admitting: Nurse Practitioner

## 2023-03-01 VITALS — BP 122/78 | HR 64 | Ht 63.5 in | Wt 212.0 lb

## 2023-03-01 DIAGNOSIS — E538 Deficiency of other specified B group vitamins: Secondary | ICD-10-CM

## 2023-03-01 DIAGNOSIS — J4550 Severe persistent asthma, uncomplicated: Secondary | ICD-10-CM

## 2023-03-01 DIAGNOSIS — G8220 Paraplegia, unspecified: Secondary | ICD-10-CM | POA: Diagnosis not present

## 2023-03-01 DIAGNOSIS — Z114 Encounter for screening for human immunodeficiency virus [HIV]: Secondary | ICD-10-CM

## 2023-03-01 DIAGNOSIS — F3340 Major depressive disorder, recurrent, in remission, unspecified: Secondary | ICD-10-CM

## 2023-03-01 DIAGNOSIS — K5901 Slow transit constipation: Secondary | ICD-10-CM

## 2023-03-01 DIAGNOSIS — Z7952 Long term (current) use of systemic steroids: Secondary | ICD-10-CM

## 2023-03-01 DIAGNOSIS — Z113 Encounter for screening for infections with a predominantly sexual mode of transmission: Secondary | ICD-10-CM

## 2023-03-01 DIAGNOSIS — N939 Abnormal uterine and vaginal bleeding, unspecified: Secondary | ICD-10-CM

## 2023-03-01 DIAGNOSIS — D61818 Other pancytopenia: Secondary | ICD-10-CM

## 2023-03-01 DIAGNOSIS — Z8669 Personal history of other diseases of the nervous system and sense organs: Secondary | ICD-10-CM

## 2023-03-01 DIAGNOSIS — E010 Iodine-deficiency related diffuse (endemic) goiter: Secondary | ICD-10-CM | POA: Insufficient documentation

## 2023-03-01 DIAGNOSIS — D508 Other iron deficiency anemias: Secondary | ICD-10-CM

## 2023-03-01 DIAGNOSIS — E539 Vitamin B deficiency, unspecified: Secondary | ICD-10-CM

## 2023-03-01 DIAGNOSIS — Z0000 Encounter for general adult medical examination without abnormal findings: Secondary | ICD-10-CM | POA: Diagnosis not present

## 2023-03-01 HISTORY — DX: Paraplegia, unspecified: G82.20

## 2023-03-01 MED ORDER — LORATADINE 10 MG PO TABS
10.0000 mg | ORAL_TABLET | Freq: Every day | ORAL | 3 refills | Status: DC
  Filled 2023-03-01: qty 100, 100d supply, fill #0
  Filled 2023-06-03: qty 100, 100d supply, fill #1
  Filled 2023-08-24: qty 100, 100d supply, fill #2
  Filled 2023-10-23: qty 100, 100d supply, fill #3

## 2023-03-01 MED ORDER — BUPROPION HCL ER (XL) 150 MG PO TB24
150.0000 mg | ORAL_TABLET | ORAL | 3 refills | Status: AC
  Filled 2023-03-01: qty 90, 90d supply, fill #0
  Filled 2023-06-03: qty 90, 90d supply, fill #1
  Filled 2023-08-24: qty 90, 90d supply, fill #2
  Filled 2023-10-23 – 2023-10-30 (×2): qty 90, 90d supply, fill #3

## 2023-03-01 NOTE — Assessment & Plan Note (Signed)
Ongoing management with hematology. Will follow. No concerns present at this time.

## 2023-03-01 NOTE — Assessment & Plan Note (Signed)
Not currently taking oral iron supplementation recently prescribed by hematology. She has been actively receiving iron infusions.  - Recommend iron supplementation to reduce iron deficiency.

## 2023-03-01 NOTE — Assessment & Plan Note (Signed)
Severe B12 deficiency leading to paraplegia, managed with B12 injections. Significant symptom improvement noted. Balance issues possibly related to B12 levels. Discussed the rarity of severe B12 deficiency and the need for regular monitoring. - Continue B12 injections - Monitor B12 and iron levels regularly

## 2023-03-01 NOTE — Assessment & Plan Note (Signed)
General Health Maintenance Discussed the need for a Pap smear and routine screenings for overall health. - Schedule Pap smear - Refill Claritin prescription  Follow-up - Follow up on lab results for thyroid function and infections - Schedule follow-up appointment as needed based on lab results.

## 2023-03-01 NOTE — Progress Notes (Signed)
Shawna Clamp, DNP, AGNP-c [facility] [address] [place], [place] [address] Main Office [phone]  BP 122/78   Pulse 64   Ht 5' 3.5" (1.613 m)   Wt 212 lb (96.2 kg)   LMP 02/07/2023   BMI 36.97 kg/m    Subjective:    Patient ID: Jill Summers, female    DOB: 30-Oct-1985, 38 y.o.   MRN: [id number]  HPI: Jill Summers is a 38 y.o. female presenting on 03/01/2023 for comprehensive medical examination.    History of Present Illness Jill Summers presents with for CPE.   She has been experiencing irregular spotting for about a week, with her menstrual period expected to start in four days. The spotting occurs when wiping, and there is no associated cramping. She has a history of tubal ligation performed twelve years ago, making pregnancy unlikely. She has a new sexual partner but denies any discharge or cramping. No shortness of breath, chest pain, palpitations, hearing or vision concerns, throat fullness, difficulty swallowing, or swelling in her feet or ankles.  She has a history of asthma, which is sometimes exacerbated by smoking marijuana, but particularly when using tobacco. She uses Trelegy daily for asthma management and has not needed to use her rescue inhaler since starting Trelegy.  She has a history of severe B12 deficiency, which previously led to paraplegia. She has been on B12 supplementation for about six years, with fluctuating B12 and iron levels. She has required iron treatments in the past. Her balance is sometimes off, but it is significantly improved compared to when she was hospitalized for two months due to the deficiency at the time of diagnosis.  She is currently taking Wellbutrin for mood management and finds it effective, noting a significant mood drop when not on the medication.  Pertinent items are noted in HPI.   Most Recent Depression Screen:     03/01/2023    1:49 PM 07/14/2021    3:43 PM 01/06/2014    9:46 AM  Depression  screen PHQ 2/9  Decreased Interest 0 1 0  Down, Depressed, Hopeless 0 1 0  PHQ - 2 Score 0 2 0  Altered sleeping  0   Tired, decreased energy  1   Change in appetite  0   Feeling bad or failure about yourself   0   Trouble concentrating  0   Moving slowly or fidgety/restless  0   Suicidal thoughts  0   PHQ-9 Score  3   Difficult doing work/chores  Very difficult    Most Recent Anxiety Screen:      No data to display         Most Recent Fall Screen:    03/01/2023    1:49 PM 07/14/2021    3:43 PM 01/09/2014   11:20 AM 01/06/2014    9:46 AM  Fall Risk   Falls in the past year? 0 0 No No  Number falls in past yr: 0 0    Injury with Fall? 0 0    Risk for fall due to : No Fall Risks No Fall Risks Impaired balance/gait;Impaired mobility   Follow up Falls evaluation completed Falls evaluation completed      Past medical history, surgical history, medications, allergies, family history and social history reviewed with patient today and changes made to appropriate areas of the chart.  Past Medical History:  Past Medical History:  Diagnosis Date   Adopted    Asthma    since baby  Bacterial vaginosis    Chlamydia    Depression    At age 47   MRSA (methicillin resistant staph aureus) culture positive 07/27/2013   MRSA infection    Obesity    Paraplegia (HCC) 03/01/2023   Urinary tract infection    Medications:  Current Outpatient Medications on File Prior to Visit  Medication Sig   acetaminophen (TYLENOL 8 HOUR) 650 MG CR tablet Take 1 tablet (650 mg total) by mouth every 8 (eight) hours as needed.   albuterol (PROVENTIL) (2.5 MG/3ML) 0.083% nebulizer solution Take 3 mLs (2.5 mg total) by nebulization every 4 (four) hours as needed for wheezing or shortness of breath.   albuterol (VENTOLIN HFA) 108 (90 Base) MCG/ACT inhaler Inhale 1-2 puffs into the lungs every 6 (six) hours as needed for wheezing or shortness of breath   cyanocobalamin (,VITAMIN B-12,) 1000 MCG/ML  injection Inject 1 mL (1,000 mcg total) into the muscle every 30 (thirty) days.   Fluticasone-Umeclidin-Vilant (TRELEGY ELLIPTA) 200-62.5-25 MCG/ACT AEPB Inhale 1 puff by mouth daily. Rinse after use.   No current facility-administered medications on file prior to visit.   Surgical History:  Past Surgical History:  Procedure Laterality Date   CESAREAN SECTION     CESAREAN SECTION WITH BILATERAL TUBAL LIGATION     Allergies:  Allergies  Allergen Reactions   Peanut-Containing Drug Products Anaphylaxis, Hives and Swelling    *Throat swelling*   Shellfish Allergy Anaphylaxis, Hives and Swelling    Throat swelling   Aleve [Naproxen]    Family History:  Family History  Adopted: Yes       Objective:    BP 122/78   Pulse 64   Ht 5' 3.5" (1.613 m)   Wt 212 lb (96.2 kg)   LMP 02/07/2023   BMI 36.97 kg/m   Wt Readings from Last 3 Encounters:  03/01/23 212 lb (96.2 kg)  01/11/23 213 lb 9.6 oz (96.9 kg)  01/05/23 210 lb 9.6 oz (95.5 kg)    Physical Exam Vitals and nursing note reviewed.  Constitutional:      General: She is not in acute distress.    Appearance: Normal appearance.  HENT:     Head: Normocephalic and atraumatic.     Right Ear: Hearing, tympanic membrane, ear canal and external ear normal.     Left Ear: Hearing, tympanic membrane, ear canal and external ear normal.     Nose: Nose normal.     Right Sinus: No maxillary sinus tenderness or frontal sinus tenderness.     Left Sinus: No maxillary sinus tenderness or frontal sinus tenderness.     Mouth/Throat:     Lips: Pink.     Mouth: Mucous membranes are moist.     Pharynx: Oropharynx is clear.  Eyes:     General: Lids are normal. Vision grossly intact.     Extraocular Movements: Extraocular movements intact.     Conjunctiva/sclera: Conjunctivae normal.     Pupils: Pupils are equal, round, and reactive to light.     Funduscopic exam:    Right eye: Red reflex present.        Left eye: Red reflex present.     Visual Fields: Right eye visual fields normal and left eye visual fields normal.  Neck:     Thyroid: No thyromegaly.     Vascular: No carotid bruit.  Cardiovascular:     Rate and Rhythm: Normal rate and regular rhythm.     Chest Wall: PMI is not displaced.  Pulses: Normal pulses.          Dorsalis pedis pulses are 2+ on the right side and 2+ on the left side.       Posterior tibial pulses are 2+ on the right side and 2+ on the left side.     Heart sounds: Normal heart sounds. No murmur heard. Pulmonary:     Effort: Pulmonary effort is normal. No respiratory distress.     Breath sounds: Normal breath sounds.  Abdominal:     General: Abdomen is flat. Bowel sounds are normal. There is no distension.     Palpations: Abdomen is soft. There is no hepatomegaly, splenomegaly or mass.     Tenderness: There is no abdominal tenderness. There is no right CVA tenderness, left CVA tenderness, guarding or rebound.  Musculoskeletal:        General: Normal range of motion.     Cervical back: Full passive range of motion without pain, normal range of motion and neck supple. No tenderness.     Right lower leg: No edema.     Left lower leg: No edema.  Feet:     Left foot:     Toenail Condition: Left toenails are normal.  Lymphadenopathy:     Cervical: No cervical adenopathy.     Upper Body:     Right upper body: No supraclavicular adenopathy.     Left upper body: No supraclavicular adenopathy.  Skin:    General: Skin is warm and dry.     Capillary Refill: Capillary refill takes less than 2 seconds.     Nails: There is no clubbing.  Neurological:     General: No focal deficit present.     Mental Status: She is alert and oriented to person, place, and time.     GCS: GCS eye subscore is 4. GCS verbal subscore is 5. GCS motor subscore is 6.     Sensory: Sensation is intact.     Motor: Motor function is intact.     Coordination: Coordination is intact.     Gait: Gait is intact.     Deep Tendon  Reflexes: Reflexes are normal and symmetric.  Psychiatric:        Attention and Perception: Attention normal.        Mood and Affect: Mood normal.        Speech: Speech normal.        Behavior: Behavior normal. Behavior is cooperative.        Thought Content: Thought content normal.        Cognition and Memory: Cognition and memory normal.        Judgment: Judgment normal.     Results for orders placed or performed in visit on 01/05/23  Vitamin B12   Collection Time: 01/05/23 12:06 PM  Result Value Ref Range   Vitamin B-12 155 (L) 180 - 914 pg/mL  CMP ([facility] Center only)   Collection Time: 01/05/23 12:06 PM  Result Value Ref Range   Sodium 140 135 - 145 mmol/L   Potassium 3.8 3.5 - 5.1 mmol/L   Chloride 108 98 - 111 mmol/L   CO2 25 22 - 32 mmol/L   Glucose, Bld 83 70 - 99 mg/dL   BUN 10 6 - 20 mg/dL   Creatinine 1.61 0.96 - 1.00 mg/dL   Calcium 9.0 8.9 - 04.5 mg/dL   Total Protein 7.0 6.5 - 8.1 g/dL   Albumin 4.0 3.5 - 5.0 g/dL   AST 14 (L) 15 -  41 U/L   ALT 11 0 - 44 U/L   Alkaline Phosphatase 36 (L) 38 - 126 U/L   Total Bilirubin 0.6 <1.2 mg/dL   GFR, Estimated >16 >10 mL/min   Anion gap 7 5 - 15  CBC with Differential (Can[facility] Center Only)   Collection Time: 01/05/23 12:06 PM  Result Value Ref Range   WBC Count 6.1 4.0 - 10.5 K/uL   RBC 4.48 3.87 - 5.11 MIL/uL   Hemoglobin 14.4 12.0 - 15.0 g/dL   HCT 96.0 45.4 - 09.8 %   MCV 90.4 80.0 - 100.0 fL   MCH 32.1 26.0 - 34.0 pg   MCHC 35.6 30.0 - 36.0 g/dL   RDW 11.9 14.7 - 82.9 %   Platelet Count 231 150 - 400 K/uL   nRBC 0.0 0.0 - 0.2 %   Neutrophils Relative % 51 %   Neutro Abs 3.1 1.7 - 7.7 K/uL   Lymphocytes Relative 33 %   Lymphs Abs 2.0 0.7 - 4.0 K/uL   Monocytes Relative 7 %   Monocytes Absolute 0.4 0.1 - 1.0 K/uL   Eosinophils Relative 8 %   Eosinophils Absolute 0.5 0.0 - 0.5 K/uL   Basophils Relative 1 %   Basophils Absolute 0.1 0.0 - 0.1 K/uL   Immature Granulocytes 0 %   Abs Immature Granulocytes  0.01 0.00 - 0.07 K/uL  Ferritin   Collection Time: 01/05/23 12:06 PM  Result Value Ref Range   Ferritin 24 11 - 307 ng/mL  Iron and Iron Binding Capacity ([facility] only)   Collection Time: 01/05/23 12:06 PM  Result Value Ref Range   Iron 92 28 - 170 ug/dL   TIBC 562 130 - 865 ug/dL   Saturation Ratios 26 10.4 - 31.8 %   UIBC 261 148 - 442 ug/dL       Assessment & Plan:   Problem List Items Addressed This Visit     Pancytopenia (HCC) (Chronic)   Ongoing management with hematology. Will follow. No concerns present at this time.       Relevant Medications   loratadine (CLARITIN) 10 MG tablet   Vitamin B 12 deficiency (Chronic)   Continues with monthly B12 injections with hematology. Recent labs resulted and reviewed.  - Continue with current treatment plan with hematology.  - We are happy to provide the B12 injections monthly if this location is more convenient.      Relevant Medications   loratadine (CLARITIN) 10 MG tablet   Polyneuropathy due to vitamin B deficiency (HCC) (Chronic)   Relevant Medications   buPROPion (WELLBUTRIN XL) 150 MG 24 hr tablet   loratadine (CLARITIN) 10 MG tablet   History of paraplegia   Severe B12 deficiency leading to paraplegia, managed with B12 injections. Significant symptom improvement noted. Balance issues possibly related to B12 levels. Discussed the rarity of severe B12 deficiency and the need for regular monitoring. - Continue B12 injections - Monitor B12 and iron levels regularly      Major depressive disorder, recurrent, in remission (HCC)   Mood disorder managed with Wellbutrin. Reports significant improvement in mood and recognizes the difference when not on medication. Discussed the importance of medication adherence to maintain mood stability. - Continue Wellbutrin - Refill Wellbutrin prescription      Relevant Medications   buPROPion (WELLBUTRIN XL) 150 MG 24 hr tablet   loratadine (CLARITIN) 10 MG tablet   Constipation    Continue with PRN colace.  Increase fiber intake.  Relevant Medications   loratadine (CLARITIN) 10 MG tablet   Iron deficiency anemia   Not currently taking oral iron supplementation recently prescribed by hematology. She has been actively receiving iron infusions.  - Recommend iron supplementation to reduce iron deficiency.       Relevant Medications   loratadine (CLARITIN) 10 MG tablet   Severe persistent asthma   Asthma managed with Trelegy, reducing the need for a rescue inhaler. Occasional marijuana use, which can exacerbate symptoms depending on the method of smoking. Discussed the importance of avoiding smoking methods that worsen asthma symptoms. - Continue Trelegy daily - Avoid smoking methods that exacerbate asthma symptoms      Relevant Medications   buPROPion (WELLBUTRIN XL) 150 MG 24 hr tablet   loratadine (CLARITIN) 10 MG tablet   Encounter for annual physical exam - Primary   General Health Maintenance Discussed the need for a Pap smear and routine screenings for overall health. - Schedule Pap smear - Refill Claritin prescription  Follow-up - Follow up on lab results for thyroid function and infections - Schedule follow-up appointment as needed based on lab results.      Relevant Orders   Hemoglobin A1c   CBC with Differential/Platelet   Comprehensive metabolic panel   HIV Antibody (routine testing w rflx)   TSH   VITAMIN D 25 Hydroxy (Vit-D Deficiency, Fractures)   Iron, TIBC and Ferritin Panel   RPR   Vaginal spotting   Irregular vaginal spotting for one week. Period expected in four days. No cramping, discharge, or new sexual partners. Recent steroid use, which can affect menstrual cycles. Differential diagnosis includes hormonal imbalance, thyroid dysfunction, or other gynecological issues. Discussed the importance of ruling out infections before scheduling a Pap smear to avoid false positives. - Check thyroid levels - Perform urine analysis -  Provide self-swab for BV, yeast, gonorrhea, chlamydia, and trichomoniasis testing - Schedule Pap smear after ruling out infections      Relevant Orders   CBC with Differential/Platelet   Comprehensive metabolic panel   Iron, TIBC and Ferritin Panel   RPR   RESOLVED: Paraplegia (HCC)   Obesity, morbid (HCC)   Recommend diet and exercise management for optimal health outcomes. Labs pending today to monitor co-morbidities and determine if additional interventions are needed for management.       Thyromegaly   Enlarged thyroid with suspected thyroid nodule noted on the left. Thyroid dysfunction can affect multiple body systems including menstrual cycles. Discussed the importance of checking thyroid levels to determine if the nodule is causing any dysfunction. Will consider [place] after reviewing hx to ensure that no further imaging has been completed.  - Check thyroid levels - Monitor nodule and thyroid function      Other Visit Diagnoses       Routine screening for STI (sexually transmitted infection)       Relevant Orders   HIV Antibody (routine testing w rflx)   RPR     Uncontrolled severe persistent asthma   (Chronic)     Relevant Orders   CBC with Differential/Platelet     Disorder of iron metabolism, unspecified       Relevant Orders   Hemoglobin A1c     Long term (current) use of systemic steroids       Relevant Orders   Hemoglobin A1c   VITAMIN D 25 Hydroxy (Vit-D Deficiency, Fractures)     Encounter for screening for human immunodeficiency virus (HIV)       Relevant Orders  HIV Antibody (routine testing w rflx)         Follow up plan: Return for pap at convenience.Marland Kitchen  NEXT PREVENTATIVE PHYSICAL DUE IN 1 YEAR.  PATIENT COUNSELING PROVIDED FOR ALL ADULT PATIENTS: A well balanced diet low in saturated fats, cholesterol, and moderation in carbohydrates.  This can be as simple as monitoring portion sizes and cutting back on sugary beverages such as soda and juice to  start with.    Daily water consumption of at least 64 ounces.  Physical activity at least 180 minutes per week.  If just starting out, start 10 minutes a day and work your way up.   This can be as simple as taking the stairs instead of the elevator and walking 2-3 laps around the office  purposefully every day.   STD protection, partner selection, and regular testing if high risk.  Limited consumption of alcoholic beverages if alcohol is consumed. For men, I recommend no more than 14 alcoholic beverages per week, spread out throughout the week (max 2 per day). Avoid "binge" drinking or consuming large quantities of alcohol in one setting.  Please let me know if you feel you may need help with reduction or quitting alcohol consumption.   Avoidance of nicotine, if used. Please let me know if you feel you may need help with reduction or quitting nicotine use.   Daily mental health attention. This can be in the form of 5 minute daily meditation, prayer, journaling, yoga, reflection, etc.  Purposeful attention to your emotions and mental state can significantly improve your overall wellbeing  and  Health.  Please know that I am here to help you with all of your health care goals and am happy to work with you to find a solution that works best for you.  The greatest advice I have received with any changes in life are to take it one step at a time, that even means if all you can focus on is the next 60 seconds, then do that and celebrate your victories.  With any changes in life, you will have set backs, and that is OK. The important thing to remember is, if you have a set back, it is not a failure, it is an opportunity to try again! Screening Testing Mammogram Every 1 -2 years based on history and risk factors Starting at age 50 Pap Smear Ages 21-39 every 3 years Ages 89-65 every 5 years with HPV testing More frequent testing may be required based on results and history Colon Cancer  Screening Every 1-10 years based on test performed, risk factors, and history Starting at age 81 Bone Density Screening Every 2-10 years based on history Starting at age 87 for women Recommendations for men differ based on medication usage, history, and risk factors AAA Screening One time ultrasound Men 66-37 years old who have every smoked Lung Cancer Screening Low Dose Lung CT every 12 months Age 42-80 years with a 30 pack-year smoking history who still smoke or who have quit within the last 15 years   Screening Labs Routine  Labs: Complete Blood Count (CBC), Complete Metabolic Panel (CMP), Cholesterol (Lipid Panel) Every 6-12 months based on history and medications May be recommended more frequently based on current conditions or previous results Hemoglobin A1c Lab Every 3-12 months based on history and previous results Starting at age 62 or earlier with diagnosis of diabetes, high cholesterol, BMI >26, and/or risk factors Frequent monitoring for patients with diabetes to ensure blood sugar control  Thyroid Panel (TSH) Every 6 months based on history, symptoms, and risk factors May be repeated more often if on medication HIV One time testing for all patients 32 and older May be repeated more frequently for patients with increased risk factors or exposure Hepatitis C One time testing for all patients 73 and older May be repeated more frequently for patients with increased risk factors or exposure Gonorrhea, Chlamydia Every 12 months for all sexually active persons 13-24 years Additional monitoring may be recommended for those who are considered high risk or who have symptoms Every 12 months for any woman on birth control, regardless of sexual activity PSA Men 43-42 years old with risk factors Additional screening may be recommended from age 47-69 based on risk factors, symptoms, and history  Vaccine Recommendations Tetanus Booster All adults every 10 years Flu Vaccine All  patients 6 months and older every year COVID Vaccine All patients 12 years and older Initial dosing with booster May recommend additional booster based on age and health history HPV Vaccine 2 doses all patients age 70-26 Dosing may be considered for patients over 26 Shingles Vaccine (Shingrix) 2 doses all adults 55 years and older Pneumonia (Pneumovax 23) All adults 65 years and older May recommend earlier dosing based on health history One year apart from Prevnar 13 Pneumonia (Prevnar 48) All adults 65 years and older Dosed 1 year after Pneumovax 23 Pneumonia (Prevnar 20) One time alternative to the two dosing of 13 and 23 For all adults with initial dose of 23, 20 is recommended 1 year later For all adults with initial dose of 13, 23 is still recommended as second option 1 year later

## 2023-03-01 NOTE — Assessment & Plan Note (Signed)
Asthma managed with Trelegy, reducing the need for a rescue inhaler. Occasional marijuana use, which can exacerbate symptoms depending on the method of smoking. Discussed the importance of avoiding smoking methods that worsen asthma symptoms. - Continue Trelegy daily - Avoid smoking methods that exacerbate asthma symptoms

## 2023-03-01 NOTE — Assessment & Plan Note (Signed)
Continue with PRN colace.  Increase fiber intake.

## 2023-03-01 NOTE — Assessment & Plan Note (Signed)
Recommend diet and exercise management for optimal health outcomes. Labs pending today to monitor co-morbidities and determine if additional interventions are needed for management.

## 2023-03-01 NOTE — Patient Instructions (Signed)
I will check your labs and will let you know if there are any concerns.   We will plan to do your pap in the future at your convenience.

## 2023-03-01 NOTE — Assessment & Plan Note (Signed)
Mood disorder managed with Wellbutrin. Reports significant improvement in mood and recognizes the difference when not on medication. Discussed the importance of medication adherence to maintain mood stability. - Continue Wellbutrin - Refill Wellbutrin prescription

## 2023-03-01 NOTE — Assessment & Plan Note (Signed)
Irregular vaginal spotting for one week. Period expected in four days. No cramping, discharge, or new sexual partners. Recent steroid use, which can affect menstrual cycles. Differential diagnosis includes hormonal imbalance, thyroid dysfunction, or other gynecological issues. Discussed the importance of ruling out infections before scheduling a Pap smear to avoid false positives. - Check thyroid levels - Perform urine analysis - Provide self-swab for BV, yeast, gonorrhea, chlamydia, and trichomoniasis testing - Schedule Pap smear after ruling out infections

## 2023-03-01 NOTE — Assessment & Plan Note (Signed)
Continues with monthly B12 injections with hematology. Recent labs resulted and reviewed.  - Continue with current treatment plan with hematology.  - We are happy to provide the B12 injections monthly if this location is more convenient.

## 2023-03-01 NOTE — Assessment & Plan Note (Signed)
Enlarged thyroid with suspected thyroid nodule noted on the left. Thyroid dysfunction can affect multiple body systems including menstrual cycles. Discussed the importance of checking thyroid levels to determine if the nodule is causing any dysfunction. Will consider [place] after reviewing hx to ensure that no further imaging has been completed.  - Check thyroid levels - Monitor nodule and thyroid function

## 2023-03-02 ENCOUNTER — Inpatient Hospital Stay: Payer: Medicare Other

## 2023-03-02 ENCOUNTER — Other Ambulatory Visit: Payer: Self-pay

## 2023-03-02 ENCOUNTER — Other Ambulatory Visit (HOSPITAL_BASED_OUTPATIENT_CLINIC_OR_DEPARTMENT_OTHER): Payer: Self-pay

## 2023-03-02 VITALS — BP 114/80 | Temp 99.1°F | Resp 18

## 2023-03-02 DIAGNOSIS — R29898 Other symptoms and signs involving the musculoskeletal system: Secondary | ICD-10-CM

## 2023-03-02 DIAGNOSIS — G63 Polyneuropathy in diseases classified elsewhere: Secondary | ICD-10-CM

## 2023-03-02 DIAGNOSIS — G8220 Paraplegia, unspecified: Secondary | ICD-10-CM

## 2023-03-02 DIAGNOSIS — D61818 Other pancytopenia: Secondary | ICD-10-CM

## 2023-03-02 DIAGNOSIS — E538 Deficiency of other specified B group vitamins: Secondary | ICD-10-CM | POA: Diagnosis present

## 2023-03-02 LAB — CBC WITH DIFFERENTIAL/PLATELET
Basophils Absolute: 0 10*3/uL (ref 0.0–0.2)
Basos: 1 %
EOS (ABSOLUTE): 0.4 10*3/uL (ref 0.0–0.4)
Eos: 7 %
Hematocrit: 40.7 % (ref 34.0–46.6)
Hemoglobin: 13.7 g/dL (ref 11.1–15.9)
Immature Grans (Abs): 0 10*3/uL (ref 0.0–0.1)
Immature Granulocytes: 0 %
Lymphocytes Absolute: 2.2 10*3/uL (ref 0.7–3.1)
Lymphs: 38 %
MCH: 31.1 pg (ref 26.6–33.0)
MCHC: 33.7 g/dL (ref 31.5–35.7)
MCV: 93 fL (ref 79–97)
Monocytes Absolute: 0.3 10*3/uL (ref 0.1–0.9)
Monocytes: 6 %
Neutrophils Absolute: 2.8 10*3/uL (ref 1.4–7.0)
Neutrophils: 48 %
Platelets: 222 10*3/uL (ref 150–450)
RBC: 4.4 x10E6/uL (ref 3.77–5.28)
RDW: 12.2 % (ref 11.7–15.4)
WBC: 5.8 10*3/uL (ref 3.4–10.8)

## 2023-03-02 LAB — COMPREHENSIVE METABOLIC PANEL
ALT: 8 [IU]/L (ref 0–32)
AST: 13 [IU]/L (ref 0–40)
Albumin: 3.8 g/dL — ABNORMAL LOW (ref 3.9–4.9)
Alkaline Phosphatase: 48 [IU]/L (ref 44–121)
BUN/Creatinine Ratio: 12 (ref 9–23)
BUN: 11 mg/dL (ref 6–20)
Bilirubin Total: 0.4 mg/dL (ref 0.0–1.2)
CO2: 18 mmol/L — ABNORMAL LOW (ref 20–29)
Calcium: 8.9 mg/dL (ref 8.7–10.2)
Chloride: 108 mmol/L — ABNORMAL HIGH (ref 96–106)
Creatinine, Ser: 0.89 mg/dL (ref 0.57–1.00)
Globulin, Total: 2.8 g/dL (ref 1.5–4.5)
Glucose: 88 mg/dL (ref 70–99)
Potassium: 4.1 mmol/L (ref 3.5–5.2)
Sodium: 141 mmol/L (ref 134–144)
Total Protein: 6.6 g/dL (ref 6.0–8.5)
eGFR: 85 mL/min/{1.73_m2} (ref >59)

## 2023-03-02 LAB — IRON,TIBC AND FERRITIN PANEL
Ferritin: 41 ng/mL (ref 15–150)
Iron Saturation: 36 % (ref 15–55)
Iron: 110 ug/dL (ref 27–159)
Total Iron Binding Capacity: 305 ug/dL (ref 250–450)
UIBC: 195 ug/dL (ref 131–425)

## 2023-03-02 LAB — VITAMIN D 25 HYDROXY (VIT D DEFICIENCY, FRACTURES): Vit D, 25-Hydroxy: 11.4 ng/mL — ABNORMAL LOW (ref 30.0–100.0)

## 2023-03-02 LAB — HEMOGLOBIN A1C
Est. average glucose Bld gHb Est-mCnc: 91 mg/dL
Hgb A1c MFr Bld: 4.8 % (ref 4.8–5.6)

## 2023-03-02 LAB — HIV ANTIBODY (ROUTINE TESTING W REFLEX): HIV Screen 4th Generation wRfx: NONREACTIVE

## 2023-03-02 LAB — TSH: TSH: 2.06 u[IU]/mL (ref 0.450–4.500)

## 2023-03-02 MED ORDER — CYANOCOBALAMIN 1000 MCG/ML IJ SOLN
1000.0000 ug | INTRAMUSCULAR | Status: DC
Start: 2023-03-02 — End: 2023-03-02
  Administered 2023-03-02: 1000 ug via INTRAMUSCULAR
  Filled 2023-03-02: qty 1

## 2023-03-07 ENCOUNTER — Encounter: Payer: Self-pay | Admitting: Nurse Practitioner

## 2023-03-07 ENCOUNTER — Other Ambulatory Visit: Payer: Self-pay | Admitting: Nurse Practitioner

## 2023-03-07 DIAGNOSIS — N341 Nonspecific urethritis: Secondary | ICD-10-CM

## 2023-03-07 DIAGNOSIS — A599 Trichomoniasis, unspecified: Secondary | ICD-10-CM

## 2023-03-07 DIAGNOSIS — B9689 Other specified bacterial agents as the cause of diseases classified elsewhere: Secondary | ICD-10-CM

## 2023-03-07 DIAGNOSIS — A493 Mycoplasma infection, unspecified site: Secondary | ICD-10-CM

## 2023-03-07 DIAGNOSIS — E559 Vitamin D deficiency, unspecified: Secondary | ICD-10-CM

## 2023-03-07 LAB — NUSWAB VG PLUS+MYCOPLASMAS,NAA
Mycoplasma hominis NAA: POSITIVE — AB
Ureaplasma spp NAA: POSITIVE — AB

## 2023-03-07 MED ORDER — AZITHROMYCIN 250 MG PO TABS
ORAL_TABLET | ORAL | 0 refills | Status: AC
  Filled 2023-03-07: qty 6, 5d supply, fill #0

## 2023-03-07 MED ORDER — VITAMIN D (ERGOCALCIFEROL) 1.25 MG (50000 UNIT) PO CAPS
50000.0000 [IU] | ORAL_CAPSULE | ORAL | 3 refills | Status: AC
  Filled 2023-03-07: qty 12, 84d supply, fill #0
  Filled 2023-06-03: qty 12, 84d supply, fill #1
  Filled 2023-08-24: qty 12, 84d supply, fill #2
  Filled 2023-10-23: qty 12, 84d supply, fill #3

## 2023-03-07 MED ORDER — METRONIDAZOLE 500 MG PO TABS
500.0000 mg | ORAL_TABLET | Freq: Two times a day (BID) | ORAL | 0 refills | Status: DC
  Filled 2023-03-07: qty 14, 7d supply, fill #0

## 2023-03-08 ENCOUNTER — Encounter: Payer: Self-pay | Admitting: Oncology

## 2023-03-08 ENCOUNTER — Other Ambulatory Visit (HOSPITAL_BASED_OUTPATIENT_CLINIC_OR_DEPARTMENT_OTHER): Payer: Self-pay

## 2023-03-23 ENCOUNTER — Encounter: Payer: Self-pay | Admitting: Oncology

## 2023-03-30 ENCOUNTER — Inpatient Hospital Stay: Payer: Medicare Other | Attending: Hematology and Oncology

## 2023-03-30 DIAGNOSIS — E538 Deficiency of other specified B group vitamins: Secondary | ICD-10-CM | POA: Diagnosis present

## 2023-03-30 DIAGNOSIS — R29898 Other symptoms and signs involving the musculoskeletal system: Secondary | ICD-10-CM

## 2023-03-30 DIAGNOSIS — G8220 Paraplegia, unspecified: Secondary | ICD-10-CM

## 2023-03-30 DIAGNOSIS — D61818 Other pancytopenia: Secondary | ICD-10-CM

## 2023-03-30 DIAGNOSIS — E539 Vitamin B deficiency, unspecified: Secondary | ICD-10-CM

## 2023-03-30 MED ORDER — CYANOCOBALAMIN 1000 MCG/ML IJ SOLN
1000.0000 ug | INTRAMUSCULAR | Status: DC
  Administered 2023-03-30: 1000 ug via INTRAMUSCULAR
  Filled 2023-03-30: qty 1

## 2023-04-26 ENCOUNTER — Other Ambulatory Visit: Payer: Self-pay

## 2023-04-26 DIAGNOSIS — D61818 Other pancytopenia: Secondary | ICD-10-CM

## 2023-04-26 DIAGNOSIS — E538 Deficiency of other specified B group vitamins: Secondary | ICD-10-CM

## 2023-04-26 DIAGNOSIS — D509 Iron deficiency anemia, unspecified: Secondary | ICD-10-CM

## 2023-04-26 NOTE — Progress Notes (Deleted)
 [facility]  [phone] [phone]    ID: Jill Summers OB: 13-Feb-1985  MR#: [id number]  KGM#:[id number]  Patient Care Team: [name], NP as PCP - General (Nurse Practitioner) Rachel Moulds, MD as Consulting Physician (Hematology and Oncology) Axel Filler, Larna Daughters, NP as Nurse Practitioner (Hematology and Oncology) Axel Filler, Larna Daughters, NP as Nurse Practitioner (Hematology and Oncology) OTHER MD:  CHIEF COMPLAINT: vit B12 anemia, subacute combined degeneration, iron deficiency  CURRENT THERAPY:  vit B12 supplementation, Feraheme as needed   INTERVAL HISTORY:  Jill Summers returns today for follow-up and treatment of her vitamin B12 anemia, subacute combined degeneration, and iron deficiency.   Rest of the pertinent 10 point ROS reviewed and negative  REVIEW OF SYSTEMS:  A detailed review of systems today was otherwise stable   HISTORY OF PRESENT ILLNESS: From the earlier summary:  Jill Summers is a right handed female with history of depression and bronchial asthma as well as recent cellulitis of the umbilicus and treated with Bactrim in September 2015 as well as recent MRSA PCR screen +2 months ago as well as previously untreated B-12 deficiency.  Patient lives with children independent prior to admission and her mother has been assisting with care of the children recently. Admitted 12/16/2013 with bouts of dizziness over the past 2 weeks as well as lower strandy weakness and numbness. She denied any falls. No change in bowel or bladder. Cranial CT scan negative. MRI of thoracic lumbar and cervical spine showed multiple bilateral thoracic cord lesions in the lateral white matter most compatible with demyelinating disease of question etiology and workup ongoing as per neurology services  and felt cord lesions most likely manifestations of B-12 deficiency. Partial visualization of lower cervical cord shows white matter lesions with  full cervical spine films pending. MRI of the brain 12/18/2013 showed less than 10 nonspecific subcentimeter supratentorial white matter T2 hyperintensities in a atypical distribution for classic demyelination. Found to have severe pancytopenia with WBC 1.4, ANC 0.4, hemoglobin 5.5, MCV 101.9, platelets 37,000. Hematology service consulted (Dr. Darnelle Catalan) She was transfused. Etiology is secondary to B-12 deficiency and currently maintained on supplementation with plan to follow-up with hematology services mostly for B-12 injections    PAST MEDICAL HISTORY: Past Medical History:  Diagnosis Date   Adopted    Asthma    since baby   Bacterial vaginosis    Chlamydia    Depression    At age 79   MRSA (methicillin resistant staph aureus) culture positive 07/27/2013   MRSA infection    Obesity    Paraplegia (HCC) 03/01/2023   Urinary tract infection     PAST SURGICAL HISTORY: Past Surgical History:  Procedure Laterality Date   CESAREAN SECTION     CESAREAN SECTION WITH BILATERAL TUBAL LIGATION      FAMILY HISTORY Family History  Adopted: Yes  Mother - hypertension, Father - diabetes   GYNECOLOGIC HISTORY:  Patient has regular menstrual periods. She is status post bilateral tubal ligation. 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  Formerly she cleaned houses.  She lives with 25 y/o son Jill Summers 01/2010). Mother Jill Summers lives next door--she is a retired Arts administrator SW-- with patient's 72 y/o child, Jill Summers.  ADVANCED DIRECTIVES: not in place   HEALTH MAINTENANCE: Social History   Tobacco Use   Smoking status: Some Days  Types: Cigars    Passive exposure: Never   Smokeless tobacco: Never  Vaping Use   Vaping status: Never Used  Substance Use Topics   Alcohol use: No    Alcohol/week: 2.0 standard drinks of alcohol    Types: 2 Standard drinks or equivalent  per week   Drug use: Yes    Types: Marijuana    Comment: Rolled in tobacco    Allergies  Allergen Reactions   Peanut-Containing Drug Products Anaphylaxis, Hives and Swelling    *Throat swelling*   Shellfish Allergy Anaphylaxis, Hives and Swelling    Throat swelling   Aleve [Naproxen]     Current Outpatient Medications  Medication Sig Dispense Refill   acetaminophen (TYLENOL 8 HOUR) 650 MG CR tablet Take 1 tablet (650 mg total) by mouth every 8 (eight) hours as needed. 100 tablet 3   albuterol (PROVENTIL) (2.5 MG/3ML) 0.083% nebulizer solution Take 3 mLs (2.5 mg total) by nebulization every 4 (four) hours as needed for wheezing or shortness of breath. 150 mL 12   albuterol (VENTOLIN HFA) 108 (90 Base) MCG/ACT inhaler Inhale 1-2 puffs into the lungs every 6 (six) hours as needed for wheezing or shortness of breath 36 g 6   buPROPion (WELLBUTRIN XL) 150 MG 24 hr tablet Take 1 tablet (150 mg total) by mouth every morning. 90 tablet 3   cyanocobalamin (,VITAMIN B-12,) 1000 MCG/ML injection Inject 1 mL (1,000 mcg total) into the muscle every 30 (thirty) days. 1 mL 11   Fluticasone-Umeclidin-Vilant (TRELEGY ELLIPTA) 200-62.5-25 MCG/ACT AEPB Inhale 1 puff by mouth daily. Rinse after use. 120 each 6   loratadine (CLARITIN) 10 MG tablet Take 1 tablet (10 mg total) by mouth daily. 100 tablet 3   metroNIDAZOLE (FLAGYL) 500 MG tablet Take 1 tablet (500 mg total) by mouth 2 (two) times daily. 14 tablet 0   Vitamin D, Ergocalciferol, (DRISDOL) 1.25 MG (50000 UNIT) CAPS capsule Take 1 capsule (50,000 Units total) by mouth every 7 (seven) days. 12 capsule 3   No current facility-administered medications for this visit.    OBJECTIVE: African-American woman in no acute distress  There were no vitals filed for this visit.   Wt Readings from Last 3 Encounters:  03/01/23 212 lb (96.2 kg)  01/11/23 213 lb 9.6 oz (96.9 kg)  01/05/23 210 lb 9.6 oz (95.5 kg)   There is no height or weight on file to  calculate BMI.    ECOG FS:1 - Symptomatic but completely ambulatory  Physical Exam Constitutional:      Appearance: Normal appearance.  HENT:     Head: Normocephalic and atraumatic.  Cardiovascular:     Rate and Rhythm: Normal rate and regular rhythm.  Pulmonary:     Breath sounds: No wheezing.  Musculoskeletal:        General: Normal range of motion.  Skin:    General: Skin is warm and dry.  Neurological:     General: No focal deficit present.     Mental Status: She is alert.     Sensory: No sensory deficit.     Motor: No weakness.     Coordination: Coordination normal.     Gait: Gait normal.     Comments: Proprioception is ABnormal in lower extremities     LAB RESULTS:  CBC    Component Value Date/Time   WBC 5.8 03/01/2023 1457   WBC 6.1 01/05/2023 1206   WBC 5.4 02/03/2022 0829   RBC 4.40 03/01/2023 1457   RBC 4.48 01/05/2023  1206   HGB 13.7 03/01/2023 1457   HGB 12.3 01/20/2016 1404   HCT 40.7 03/01/2023 1457   HCT 37.1 01/20/2016 1404   PLT 222 03/01/2023 1457   MCV 93 03/01/2023 1457   MCV 85.5 01/20/2016 1404   MCH 31.1 03/01/2023 1457   MCH 32.1 01/05/2023 1206   MCHC 33.7 03/01/2023 1457   MCHC 35.6 01/05/2023 1206   RDW 12.2 03/01/2023 1457   RDW 14.1 01/20/2016 1404   LYMPHSABS 2.2 03/01/2023 1457   LYMPHSABS 2.8 01/20/2016 1404   MONOABS 0.4 01/05/2023 1206   MONOABS 0.2 01/20/2016 1404   EOSABS 0.4 03/01/2023 1457   BASOSABS 0.0 03/01/2023 1457   BASOSABS 0.0 01/20/2016 1404   CMP     Component Value Date/Time   NA 141 03/01/2023 1457   NA 140 02/04/2015 1049   K 4.1 03/01/2023 1457   K 3.9 02/04/2015 1049   CL 108 (H) 03/01/2023 1457   CO2 18 (L) 03/01/2023 1457   CO2 23 02/04/2015 1049   GLUCOSE 88 03/01/2023 1457   GLUCOSE 83 01/05/2023 1206   GLUCOSE 98 02/04/2015 1049   BUN 11 03/01/2023 1457   BUN 9.8 02/04/2015 1049   CREATININE 0.89 03/01/2023 1457   CREATININE 0.85 01/05/2023 1206   CREATININE 0.9 02/04/2015 1049    CALCIUM 8.9 03/01/2023 1457   CALCIUM 8.8 02/04/2015 1049   PROT 6.6 03/01/2023 1457   PROT 6.9 02/04/2015 1049   ALBUMIN 3.8 (L) 03/01/2023 1457   ALBUMIN 3.4 (L) 02/04/2015 1049   AST 13 03/01/2023 1457   AST 14 (L) 01/05/2023 1206   AST 13 02/04/2015 1049   ALT 8 03/01/2023 1457   ALT 11 01/05/2023 1206   ALT <9 02/04/2015 1049   ALKPHOS 48 03/01/2023 1457   ALKPHOS 53 02/04/2015 1049   BILITOT 0.4 03/01/2023 1457   BILITOT 0.6 01/05/2023 1206   BILITOT 0.47 02/04/2015 1049   GFRNONAA >60 01/05/2023 1206   GFRAA >60 11/05/2018 1036   GFRAA >60 01/29/2018 0831        Component Value Date/Time   COLORURINE AMBER (A) 12/16/2013 0707   APPEARANCEUR HAZY (A) 12/16/2013 0707   LABSPEC 1.025 12/16/2013 0707   PHURINE 5.0 12/16/2013 0707   GLUCOSEU NEGATIVE 12/16/2013 0707   HGBUR LARGE (A) 12/16/2013 0707   BILIRUBINUR SMALL (A) 12/16/2013 0707   KETONESUR NEGATIVE 12/16/2013 0707   PROTEINUR NEGATIVE 12/16/2013 0707   UROBILINOGEN >8.0 (H) 12/16/2013 0707   NITRITE NEGATIVE 12/16/2013 0707   LEUKOCYTESUR SMALL (A) 12/16/2013 0707   Iron/TIBC/Ferritin/ %Sat    Component Value Date/Time   IRON 110 03/01/2023 1457   TIBC 305 03/01/2023 1457   FERRITIN 41 03/01/2023 1457   FERRITIN 9 12/09/2015 1037   IRONPCTSAT 36 03/01/2023 1457    STUDIES: No results found.   ASSESSMENT: 38 y.o. Jill Summers woman presenting 11/15/2013 with inability to walk, fever and pancytopenia, found to be severely B-12 deficient.  (1) B-12 deficiency: will need lifelong replacement; as patient does not have PCP this can be done through our office             (a) received montlhy  (2) subacute combined degeneration - Stable  (3) iron deficiency: Last ferritin level in February 2023 of 4  PLAN:   Ms. Britania is here for follow-up.     Total time spent: 30 minutes   *Total Encounter Time as defined by the Centers for Medicare and Medicaid Services includes, in addition to the  face-to-face  time of a patient visit (documented in the note above) non-face-to-face time: obtaining and reviewing outside history, ordering and reviewing medications, tests or procedures, care coordination (communications with other health care professionals or caregivers) and documentation in the medical record.  Rachel Moulds MD

## 2023-04-27 ENCOUNTER — Inpatient Hospital Stay: Payer: Medicare Other | Admitting: Hematology and Oncology

## 2023-04-27 ENCOUNTER — Inpatient Hospital Stay: Payer: Medicare Other | Attending: Hematology and Oncology

## 2023-04-27 ENCOUNTER — Telehealth: Payer: Self-pay

## 2023-04-27 ENCOUNTER — Inpatient Hospital Stay: Payer: Medicare Other

## 2023-04-27 NOTE — Telephone Encounter (Signed)
 Tried to reach patient in regards to missed appts today.  LVM for return call. Schedule message sent to reschedule.

## 2023-05-18 ENCOUNTER — Encounter: Payer: Self-pay | Admitting: Oncology

## 2023-05-25 ENCOUNTER — Inpatient Hospital Stay: Payer: Medicare Other | Attending: Hematology and Oncology

## 2023-05-25 DIAGNOSIS — E538 Deficiency of other specified B group vitamins: Secondary | ICD-10-CM | POA: Insufficient documentation

## 2023-05-25 DIAGNOSIS — G8220 Paraplegia, unspecified: Secondary | ICD-10-CM

## 2023-05-25 DIAGNOSIS — D61818 Other pancytopenia: Secondary | ICD-10-CM

## 2023-05-25 DIAGNOSIS — R29898 Other symptoms and signs involving the musculoskeletal system: Secondary | ICD-10-CM

## 2023-05-25 DIAGNOSIS — E539 Vitamin B deficiency, unspecified: Secondary | ICD-10-CM

## 2023-05-25 MED ORDER — CYANOCOBALAMIN 1000 MCG/ML IJ SOLN
1000.0000 ug | INTRAMUSCULAR | Status: DC
  Administered 2023-05-25: 1000 ug via INTRAMUSCULAR
  Filled 2023-05-25: qty 1

## 2023-06-03 ENCOUNTER — Other Ambulatory Visit (HOSPITAL_BASED_OUTPATIENT_CLINIC_OR_DEPARTMENT_OTHER): Payer: Self-pay | Admitting: Nurse Practitioner

## 2023-06-03 DIAGNOSIS — E538 Deficiency of other specified B group vitamins: Secondary | ICD-10-CM

## 2023-06-03 DIAGNOSIS — E539 Vitamin B deficiency, unspecified: Secondary | ICD-10-CM

## 2023-06-03 DIAGNOSIS — D61818 Other pancytopenia: Secondary | ICD-10-CM

## 2023-06-03 DIAGNOSIS — D508 Other iron deficiency anemias: Secondary | ICD-10-CM

## 2023-06-03 DIAGNOSIS — K5901 Slow transit constipation: Secondary | ICD-10-CM

## 2023-06-03 DIAGNOSIS — F3340 Major depressive disorder, recurrent, in remission, unspecified: Secondary | ICD-10-CM

## 2023-06-03 DIAGNOSIS — J4550 Severe persistent asthma, uncomplicated: Secondary | ICD-10-CM

## 2023-06-04 ENCOUNTER — Other Ambulatory Visit: Payer: Self-pay

## 2023-06-04 ENCOUNTER — Other Ambulatory Visit (HOSPITAL_BASED_OUTPATIENT_CLINIC_OR_DEPARTMENT_OTHER): Payer: Self-pay

## 2023-06-04 ENCOUNTER — Encounter: Payer: Self-pay | Admitting: Oncology

## 2023-06-04 MED ORDER — CYANOCOBALAMIN 1000 MCG/ML IJ SOLN
1000.0000 ug | INTRAMUSCULAR | 11 refills | Status: AC
Start: 2023-06-04 — End: ?
  Filled 2023-06-04: qty 1, 30d supply, fill #0

## 2023-06-06 ENCOUNTER — Other Ambulatory Visit (HOSPITAL_BASED_OUTPATIENT_CLINIC_OR_DEPARTMENT_OTHER): Payer: Self-pay

## 2023-06-14 ENCOUNTER — Other Ambulatory Visit (HOSPITAL_BASED_OUTPATIENT_CLINIC_OR_DEPARTMENT_OTHER): Payer: Self-pay

## 2023-06-15 ENCOUNTER — Other Ambulatory Visit (HOSPITAL_BASED_OUTPATIENT_CLINIC_OR_DEPARTMENT_OTHER): Payer: Self-pay

## 2023-06-22 ENCOUNTER — Inpatient Hospital Stay: Payer: Medicare Other | Attending: Hematology and Oncology

## 2023-06-22 DIAGNOSIS — E538 Deficiency of other specified B group vitamins: Secondary | ICD-10-CM | POA: Diagnosis present

## 2023-06-22 DIAGNOSIS — E539 Vitamin B deficiency, unspecified: Secondary | ICD-10-CM

## 2023-06-22 DIAGNOSIS — G8220 Paraplegia, unspecified: Secondary | ICD-10-CM

## 2023-06-22 DIAGNOSIS — R29898 Other symptoms and signs involving the musculoskeletal system: Secondary | ICD-10-CM

## 2023-06-22 DIAGNOSIS — D61818 Other pancytopenia: Secondary | ICD-10-CM

## 2023-06-22 MED ORDER — CYANOCOBALAMIN 1000 MCG/ML IJ SOLN: 1000.0000 ug | INTRAMUSCULAR | Status: DC

## 2023-06-22 MED ORDER — CYANOCOBALAMIN 1000 MCG/ML IJ SOLN
1000.0000 ug | INTRAMUSCULAR | Status: DC
  Administered 2023-06-22: 1000 ug via INTRAMUSCULAR
  Filled 2023-06-22: qty 1

## 2023-07-27 ENCOUNTER — Other Ambulatory Visit (HOSPITAL_BASED_OUTPATIENT_CLINIC_OR_DEPARTMENT_OTHER): Payer: Self-pay

## 2023-08-23 ENCOUNTER — Encounter: Payer: Self-pay | Admitting: Oncology

## 2023-08-23 ENCOUNTER — Ambulatory Visit: Payer: Self-pay

## 2023-08-23 NOTE — Telephone Encounter (Signed)
 Copied from CRM #[id number]. Topic: Clinical - Medical Advice >> Aug 23, 2023 12:16 PM Jill Summers wrote: Reason for CRM: Pt is experiencing hives an would like to get a prescription for it, please call pt back ASAP.

## 2023-08-23 NOTE — Telephone Encounter (Signed)
 FYI Only or Action Required?: FYI only for provider.  Patient was last seen in primary care on 03/01/2023 by Early, Camie BRAVO, NP.  Called Nurse Triage reporting Allergic Reaction.  Symptoms began several days ago.  Interventions attempted: OTC medications: benadryl and zyrtek.  Symptoms are: gradually worsening.  Triage Disposition: See Physician Within 24 Hours  Patient/caregiver understands and will follow disposition?: Yes Copied from CRM #[id number]. Topic: Clinical - Medical Advice >> Aug 23, 2023 12:16 PM Jasmin G wrote: Reason for CRM: Pt is experiencing hives an would like to get a prescription for it, please call pt back ASAP. Reason for Disposition  [1] MODERATE-SEVERE hives (e.g.,hives interfere with normal activities or work) AND [2] not improved after taking antihistamine (e.g., cetirizine, fexofenadine, or loratadine ) > 24 hours  Answer Assessment - Initial Assessment Questions 1. APPEARANCE: What does the rash look like?      Looks like mosquito bites everywhere...looks like lttle bumps   2. LOCATION: Where is the rash located?      Thighs, back of legs and arms  3. NUMBER: How many hives are there?      Patch  4. SIZE: How big are the hives? (e.g., inches, cm, compare to coins) Do they all look the same or do they vary in shape and size?      The size of a dime  5. ONSET: When did the hives begin? (e.g., hours or days ago)      Started 3 days,   6. ITCHING: Does it itch? If Yes, ask: How bad is the itch?  (e.g., none, mild, moderate, severe)     Itching severely  7. RECURRENT PROBLEM: Have you had hives before? If Yes, ask: When was the last time? and What happened that time?      Has history of hives but antihistamine not helping. Was taking bendaryl and zyrtek  8. TRIGGERS: Were you exposed to any new food, plant, cosmetic product or animal just before the hives began?     Recent stressed   9. OTHER SYMPTOMS: Do you have any other  symptoms? (e.g., fever, tongue swelling, difficulty breathing, abdomen pain)     No  10. PREGNANCY: Is there any chance you are pregnant? When was your last menstrual period?       Abiout to start  Protocols used: Hives-A-AH

## 2023-08-24 ENCOUNTER — Other Ambulatory Visit (HOSPITAL_BASED_OUTPATIENT_CLINIC_OR_DEPARTMENT_OTHER): Payer: Self-pay

## 2023-08-24 ENCOUNTER — Ambulatory Visit (INDEPENDENT_AMBULATORY_CARE_PROVIDER_SITE_OTHER): Admitting: Nurse Practitioner

## 2023-08-24 ENCOUNTER — Encounter: Payer: Self-pay | Admitting: Nurse Practitioner

## 2023-08-24 ENCOUNTER — Encounter: Payer: Self-pay | Admitting: Oncology

## 2023-08-24 VITALS — BP 124/82 | HR 78 | Wt 213.6 lb

## 2023-08-24 DIAGNOSIS — L509 Urticaria, unspecified: Secondary | ICD-10-CM

## 2023-08-24 MED ORDER — PREDNISONE 20 MG PO TABS
ORAL_TABLET | ORAL | 1 refills | Status: AC
  Filled 2023-08-24: qty 15, 14d supply, fill #0

## 2023-08-24 MED ORDER — METHYLPREDNISOLONE SODIUM SUCC 125 MG IJ SOLR
125.0000 mg | Freq: Once | INTRAMUSCULAR | Status: AC
  Administered 2023-08-24: 125 mg via INTRAMUSCULAR

## 2023-08-24 NOTE — Progress Notes (Signed)
 Camie FORBES Doing, DNP, AGNP-c [facility] [address] [place], [place] [address] [phone]   ACUTE VISIT- ESTABLISHED PATIENT  Blood pressure 124/82, pulse 78, weight 213 lb 9.6 oz (96.9 kg).  Subjective:  HPI History of Present Illness Jill Summers is a 38 year old female who presents with a three-day history of widespread itching and rash.  She has experienced a widespread rash accompanied by severe itching for the past three days. The rash is primarily located on her legs and right arm, with less involvement on her upper body and left arm. The itching is severe enough to disrupt her sleep, leading to irritability. She has tried Benadryl, cetirizine, and hydrocortisone cream without relief.  She denies any recent changes in soap, lotion, medicine, or food, and has not had any recent carpet cleaning or other environmental changes. She maintains regular hygiene practices, including washing her sheets weekly due to sensitive skin.  She has a history of similar episodes in the past, which she associates with stress. She has not experienced these symptoms in years until now. No cross-contamination with peanuts or shellfish, which previously caused throat swelling.  Her asthma is well-controlled with a daily inhaler, and she has not needed a rescue inhaler in months. She is adopted and therefore unsure of her family medical history, particularly regarding autoimmune conditions.  Timing: How long have hives been present? 3 days  Triggers: new foods, meds (NSAIDs, antibiotics), infections, exercise, cold, pressure, stress? Stress - no other changes  Distribution: localized vs widespread- widespread  Associated symptoms: angioedema, difficulty breathing, GI symptoms (suggest anaphylaxis or systemic involvement)?  none  Family history of atopy, autoimmune disease, or angioedema? Anaphylaxis to peanuts and shellfish- sure there was no exposure  Response to  antihistamines? Mildly effective, if at all    ROS negative except for what is listed in HPI. History, Medications, Surgery, SDOH, and Family History reviewed and updated as appropriate.  Objective:  Physical Exam Vitals and nursing note reviewed.  Constitutional:      Appearance: She is not ill-appearing or toxic-appearing.  HENT:     Head: Normocephalic.     Nose: Nose normal.     Mouth/Throat:     Mouth: Mucous membranes are moist.     Pharynx: Oropharynx is clear.  Eyes:     Conjunctiva/sclera: Conjunctivae normal.     Pupils: Pupils are equal, round, and reactive to light.  Cardiovascular:     Rate and Rhythm: Normal rate and regular rhythm.     Pulses: Normal pulses.     Heart sounds: Normal heart sounds.  Pulmonary:     Effort: Pulmonary effort is normal.     Breath sounds: Normal breath sounds.  Musculoskeletal:     Cervical back: Neck supple. No tenderness.  Lymphadenopathy:     Cervical: No cervical adenopathy.  Skin:    General: Skin is warm and dry.     Capillary Refill: Capillary refill takes less than 2 seconds.     Findings: Rash present. Rash is urticarial.     Comments: Urticaria noted bilaterally on the lower extremities with significant distribution and some areas of coalescence, most notable on the left. Upper extremities show scattered wheels with minimal distribution. No rash noted on neck, back, chest, abdomen.   Neurological:     Mental Status: She is alert and oriented to person, place, and time.     Sensory: No sensory deficit.     Motor: No weakness.     Coordination:  Coordination normal.  Psychiatric:        Mood and Affect: Mood normal.        Behavior: Behavior normal.         Assessment & Plan:   Problem List Items Addressed This Visit     Urticaria of unknown origin - Primary   Presents with a three-day history of widespread pruritus and rash, primarily on the legs (l > r) and minimal distribution on the arms, with less involvement  of the upper body. No recent exposure to new allergens. Similar episodes previously attributed to stress. Differential diagnosis includes idiopathic urticaria, potentially stress-induced, as no clear allergen identified. Denies dyspnea, and asthma is well-controlled with no recent need for rescue inhalers. Discussed treatment options including daily prednisone  with a slow taper to control symptoms by blocking immune response. A Solu-Medrol injection today will boost the medication into her system, with oral prednisone  to follow. Cetirizine and famotidine  can be continued as needed for pruritus. - Administer Solu-Medrol injection today. - Prescribe oral prednisone  with a slow taper to manage symptoms. - Continue cetirizine as needed for pruritus. - Consider famotidine  if additional relief is needed.       Relevant Medications   predniSONE  (DELTASONE ) 20 MG tablet      Camie FORBES Doing, DNP, AGNP-c

## 2023-08-24 NOTE — Assessment & Plan Note (Signed)
 Presents with a three-day history of widespread pruritus and rash, primarily on the legs (l > r) and minimal distribution on the arms, with less involvement of the upper body. No recent exposure to new allergens. Similar episodes previously attributed to stress. Differential diagnosis includes idiopathic urticaria, potentially stress-induced, as no clear allergen identified. Denies dyspnea, and asthma is well-controlled with no recent need for rescue inhalers. Discussed treatment options including daily prednisone  with a slow taper to control symptoms by blocking immune response. A Solu-Medrol injection today will boost the medication into her system, with oral prednisone  to follow. Cetirizine and famotidine  can be continued as needed for pruritus. - Administer Solu-Medrol injection today. - Prescribe oral prednisone  with a slow taper to manage symptoms. - Continue cetirizine as needed for pruritus. - Consider famotidine  if additional relief is needed.

## 2023-08-24 NOTE — Patient Instructions (Signed)
 Take 2 tablets by mouth starting tomorrow morning for 4 days. Then take 1 tablet by mouth for 4 days. Then take 1/2 tablet by mouth for 4 days.   If this does not get better or starts to get worse when you taper down, go back up to the last dose of the steroid and send us  a message.    If you have any throat swelling, shortness of breath, difficulty swallowing or speaking, call 911 or seek emergency medical care immediately.    You can continue to take zyrtec daily, if needed.

## 2023-09-28 ENCOUNTER — Other Ambulatory Visit (HOSPITAL_BASED_OUTPATIENT_CLINIC_OR_DEPARTMENT_OTHER): Payer: Self-pay

## 2023-09-28 ENCOUNTER — Emergency Department (HOSPITAL_BASED_OUTPATIENT_CLINIC_OR_DEPARTMENT_OTHER)
Admission: EM | Admit: 2023-09-28 | Discharge: 2023-09-28 | Disposition: A | Discharge status: Home / Self Care | Attending: Emergency Medicine | Admitting: Emergency Medicine

## 2023-09-28 ENCOUNTER — Encounter: Payer: Self-pay | Admitting: Oncology

## 2023-09-28 ENCOUNTER — Encounter (HOSPITAL_BASED_OUTPATIENT_CLINIC_OR_DEPARTMENT_OTHER): Payer: Self-pay

## 2023-09-28 ENCOUNTER — Other Ambulatory Visit: Payer: Self-pay

## 2023-09-28 DIAGNOSIS — L509 Urticaria, unspecified: Secondary | ICD-10-CM | POA: Diagnosis present

## 2023-09-28 DIAGNOSIS — Z91010 Allergy to peanuts: Secondary | ICD-10-CM | POA: Diagnosis not present

## 2023-09-28 MED ORDER — DEXAMETHASONE SODIUM PHOSPHATE 4 MG/ML IJ SOLN
4.0000 mg | Freq: Once | INTRAMUSCULAR | Status: AC
  Administered 2023-09-28: 4 mg via INTRAMUSCULAR
  Filled 2023-09-28: qty 1

## 2023-09-28 MED ORDER — PREDNISONE 20 MG PO TABS
ORAL_TABLET | ORAL | 0 refills | Status: AC
  Filled 2023-09-28: qty 11, 5d supply, fill #0

## 2023-09-28 MED ORDER — FAMOTIDINE 20 MG PO TABS
20.0000 mg | ORAL_TABLET | Freq: Once | ORAL | Status: AC
  Administered 2023-09-28: 20 mg via ORAL
  Filled 2023-09-28: qty 1

## 2023-09-28 NOTE — ED Triage Notes (Signed)
 Pt arrives for hives on lower half of body. Taking Benadryl and zyrtec. Seen by PCP and given steroid injection and prednisone .

## 2023-09-28 NOTE — Discharge Instructions (Addendum)
 Increase Zyrtec to twice daily.

## 2023-09-28 NOTE — ED Provider Notes (Signed)
 [place] EMERGENCY DEPARTMENT AT [facility] Provider Note   CSN: [id number] Arrival date & time: 09/28/23  0443     Patient presents with: Urticaria   Jill Summers is a 38 y.o. female.   The history is provided by the patient.  Urticaria The current episode started more than 1 week ago. The problem occurs constantly. The problem has not changed since onset.Pertinent negatives include no shortness of breath. Nothing aggravates the symptoms. Nothing relieves the symptoms. Treatments tried: benadryl and zyrtec and steroids taper. The treatment provided no relief.  Ongoing for at least weeks but not responsive to medication.  No detergent soap or environmental triggers she is aware of.      Prior to Admission medications   Medication Sig Start Date End Date Taking? Authorizing Provider  predniSONE  (DELTASONE ) 20 MG tablet 3 tabs po day one, then 2 po daily x 4 days 09/28/23  Yes Andrik Sandt, MD  acetaminophen  (TYLENOL  8 HOUR) 650 MG CR tablet Take 1 tablet (650 mg total) by mouth every 8 (eight) hours as needed. 07/14/21   Early, Sara E, NP  albuterol  (PROVENTIL ) (2.5 MG/3ML) 0.083% nebulizer solution Take 3 mLs (2.5 mg total) by nebulization every 4 (four) hours as needed for wheezing or shortness of breath. 01/11/23   Early, Sara E, NP  albuterol  (VENTOLIN  HFA) 108 (90 Base) MCG/ACT inhaler Inhale 1-2 puffs into the lungs every 6 (six) hours as needed for wheezing or shortness of breath 01/11/23   Early, Camie BRAVO, NP  buPROPion  (WELLBUTRIN  XL) 150 MG 24 hr tablet Take 1 tablet (150 mg total) by mouth every morning. 03/01/23   Early, Sara E, NP  cyanocobalamin  (VITAMIN B12) 1000 MCG/ML injection Inject 1 mL (1,000 mcg total) into the muscle every 30 (thirty) days. 06/04/23   Early, Sara E, NP  Fluticasone -Umeclidin-Vilant (TRELEGY ELLIPTA ) 200-62.5-25 MCG/ACT AEPB Inhale 1 puff by mouth daily. Rinse after use. 01/11/23   Early, Sara E, NP  loratadine  (CLARITIN ) 10 MG tablet  Take 1 tablet (10 mg total) by mouth daily. 03/01/23   Early, Sara E, NP  Vitamin D , Ergocalciferol , (DRISDOL ) 1.25 MG (50000 UNIT) CAPS capsule Take 1 capsule (50,000 Units total) by mouth every 7 (seven) days. 03/07/23   Early, Sara E, NP    Allergies: Peanut-containing drug products, Shellfish allergy, and Aleve [naproxen]    Review of Systems  HENT:  Negative for congestion, facial swelling and trouble swallowing.   Respiratory:  Negative for shortness of breath, wheezing and stridor.   Gastrointestinal:  Negative for vomiting.  Skin:  Negative for rash.  All other systems reviewed and are negative.   Updated Vital Signs BP (!) 143/93   Pulse 92   Temp 97.7 F (36.5 C) (Oral)   Resp 20   LMP 09/28/2023 (Exact Date)   SpO2 100%   Physical Exam Vitals and nursing note reviewed. Exam conducted with a chaperone present.  Constitutional:      General: She is not in acute distress.    Appearance: Normal appearance. She is well-developed.  HENT:     Head: Normocephalic and atraumatic.     Nose: Nose normal.     Mouth/Throat:     Mouth: Mucous membranes are moist.     Pharynx: Oropharynx is clear. No oropharyngeal exudate.     Comments: No swelling of the lips tongue or uvula  Eyes:     Pupils: Pupils are equal, round, and reactive to light.  Cardiovascular:  Rate and Rhythm: Normal rate and regular rhythm.     Pulses: Normal pulses.     Heart sounds: Normal heart sounds.  Pulmonary:     Effort: Pulmonary effort is normal. No respiratory distress.     Breath sounds: Normal breath sounds.  Abdominal:     General: Bowel sounds are normal. There is no distension.     Palpations: Abdomen is soft.     Tenderness: There is no abdominal tenderness. There is no guarding or rebound.  Genitourinary:    Vagina: No vaginal discharge.  Musculoskeletal:        General: Normal range of motion.     Cervical back: Neck supple.  Skin:    General: Skin is warm and dry.     Capillary  Refill: Capillary refill takes less than 2 seconds.     Findings: No erythema or rash.     Comments: Scattered urticaria of the lower extremities   Neurological:     General: No focal deficit present.     Mental Status: She is alert.     Deep Tendon Reflexes: Reflexes normal.  Psychiatric:        Mood and Affect: Mood normal.     (all labs ordered are listed, but only abnormal results are displayed) Labs Reviewed - No data to display  EKG: None  Radiology: No results found.   Procedures   Medications Ordered in the ED  dexamethasone  (DECADRON ) injection 4 mg (has no administration in time range)  famotidine  (PEPCID ) tablet 20 mg (has no administration in time range)                                    Medical Decision Making Urticaria for 2 or more weeks   Amount and/or Complexity of Data Reviewed External Data Reviewed: notes.    Details: Previous notes reviewed   Risk Prescription drug management. Risk Details: Ongoing symptoms despite treatment.  Do not take both benadryl and zyrtec, increase zyrtec to twice daily.  Restart steroids,  will add pepcid .  Follow up with allergy for ongoing testing and treatment.       Final diagnoses:  Urticaria   No signs of systemic illness or infection. The patient is nontoxic-appearing on exam and vital signs are within normal limits.  I have reviewed the triage vital signs and the nursing notes. Pertinent labs & imaging results that were available during my care of the patient were reviewed by me and considered in my medical decision making (see chart for details). After history, exam, and medical workup I feel the patient has been appropriately medically screened and is safe for discharge home. Pertinent diagnoses were discussed with the patient. Patient was given return precautions.    ED Discharge Orders          Ordered    predniSONE  (DELTASONE ) 20 MG tablet        09/28/23 0452               Capucine Tryon,  MD 09/28/23 9540

## 2023-10-23 ENCOUNTER — Other Ambulatory Visit: Payer: Self-pay

## 2023-10-23 ENCOUNTER — Other Ambulatory Visit (HOSPITAL_BASED_OUTPATIENT_CLINIC_OR_DEPARTMENT_OTHER): Payer: Self-pay

## 2023-10-23 ENCOUNTER — Encounter: Payer: Self-pay | Admitting: Oncology

## 2023-10-24 ENCOUNTER — Other Ambulatory Visit: Payer: Self-pay

## 2023-12-03 ENCOUNTER — Ambulatory Visit (INDEPENDENT_AMBULATORY_CARE_PROVIDER_SITE_OTHER): Admitting: Family Medicine

## 2023-12-03 ENCOUNTER — Encounter: Payer: Self-pay | Admitting: Family Medicine

## 2023-12-03 ENCOUNTER — Other Ambulatory Visit (HOSPITAL_BASED_OUTPATIENT_CLINIC_OR_DEPARTMENT_OTHER): Payer: Self-pay

## 2023-12-03 VITALS — BP 128/74 | HR 92 | Temp 98.4°F | Ht 63.0 in | Wt 217.6 lb

## 2023-12-03 DIAGNOSIS — J4550 Severe persistent asthma, uncomplicated: Secondary | ICD-10-CM

## 2023-12-03 DIAGNOSIS — E559 Vitamin D deficiency, unspecified: Secondary | ICD-10-CM

## 2023-12-03 DIAGNOSIS — F1290 Cannabis use, unspecified, uncomplicated: Secondary | ICD-10-CM

## 2023-12-03 DIAGNOSIS — L508 Other urticaria: Secondary | ICD-10-CM | POA: Diagnosis not present

## 2023-12-03 MED ORDER — PREDNISONE 10 MG PO TABS
ORAL_TABLET | ORAL | 0 refills | Status: AC
  Filled 2023-12-03: qty 30, 11d supply, fill #0

## 2023-12-03 NOTE — Progress Notes (Signed)
 Chief Complaint  Patient presents with   other    Started breaking out about a week ago all over body. Has always had issue with it as long as she can remember, comes and goes-itches. She hasn't changed anything   Last week she started having hives --arms, butt, thighs, ankles. Not on chest or face.  She denies any use of new products, foods. Known allergy to shrimp, crablegs and peanut butter.  Hasn't had any exposure, nor any associated throat-swelling like she gets when she eats those foods. No new medications or supplements.  She has been taking Cetirizine--I pop them--1-2 at a time, up to 5 per day. Also took fexofenadine, 1 daily, along with the cetirizine. Using OTC HC cream to help with itching.  She reports h/o hives in the past.  Had skin testing through dermatologist (not allergist). 2-3 years ago. 2 recent episodes-- ER 8/29 with urticaria--treated with prednisone  SB 7/25 for urticaria--treated with prednisone    PMH, PSH, SH reviewed  Asthma and depression B12 deficiency, on monthly shots Smokes marijuana Vitamin D  deficiency   Outpatient Encounter Medications as of 12/03/2023  Medication Sig   acetaminophen  (TYLENOL  8 HOUR) 650 MG CR tablet Take 1 tablet (650 mg total) by mouth every 8 (eight) hours as needed.   albuterol  (PROVENTIL ) (2.5 MG/3ML) 0.083% nebulizer solution Take 3 mLs (2.5 mg total) by nebulization every 4 (four) hours as needed for wheezing or shortness of breath.   albuterol  (VENTOLIN  HFA) 108 (90 Base) MCG/ACT inhaler Inhale 1-2 puffs into the lungs every 6 (six) hours as needed for wheezing or shortness of breath   buPROPion  (WELLBUTRIN  XL) 150 MG 24 hr tablet Take 1 tablet (150 mg total) by mouth every morning.   cyanocobalamin  (VITAMIN B12) 1000 MCG/ML injection Inject 1 mL (1,000 mcg total) into the muscle every 30 (thirty) days.   Fluticasone -Umeclidin-Vilant (TRELEGY ELLIPTA ) 200-62.5-25 MCG/ACT AEPB Inhale 1 puff by mouth daily. Rinse after  use.   loratadine  (CLARITIN ) 10 MG tablet Take 1 tablet (10 mg total) by mouth daily.   Vitamin D , Ergocalciferol , (DRISDOL ) 1.25 MG (50000 UNIT) CAPS capsule Take 1 capsule (50,000 Units total) by mouth every 7 (seven) days.   No facility-administered encounter medications on file as of 12/03/2023.   Allergies  Allergen Reactions   Peanut-Containing Drug Products Anaphylaxis, Hives and Swelling    *Throat swelling*   Shellfish Allergy Anaphylaxis, Hives and Swelling    Throat swelling   Aleve [Naproxen]     ROS: No f/c, URI symptoms. Asthma is well controlled with Trelegy, hasn't needed rescue inhaler for months. No n/v/d. Hives per HPI No CP or SOB. No HA or dizziness   PHYSICAL EXAM:  BP 128/74   Pulse 92   Temp 98.4 F (36.9 C)   Ht 5' 3 (1.6 m)   Wt 217 lb 9.6 oz (98.7 kg)   LMP 11/12/2023   SpO2 97%   BMI 38.55 kg/m   Pleasant female, scratching at arms and legs during visit. Otherwise is in no distress HEENT: conjunctiva and sclera are clear, OP clear. Neck: no lymphadenopathy or mass, no thyromegaly Heart: regular rate and rhythm Lungs: clear bilaterally, no wheezes Extremities: no edema Psych: normal mood, affect, hygiene and grooming Neuro: alert and oriented, cranial nerves grossly intact, normal gait Skin: diffuse urticaria on arms, legs, lower back, shoulders.  Sparing of face, chest, upper back.    ASSESSMENT/PLAN:  Recurrent urticaria - Treat with prednisone  taper; reviewed rec doses of antihistamines. Start lab  eval, and refer to allergist, given frequent recurrences - Plan: C3 complement, C4 complement, Complement, total, Hepatic function panel, CBC with Differential/Platelet, Sedimentation Rate, C-reactive protein, ANA, IFA (with reflex), Ambulatory referral to Allergy, predniSONE  (DELTASONE ) 10 MG tablet  Vitamin D  deficiency - on Rx replacement, due for recheck of level - Plan: VITAMIN D  25 Hydroxy (Vit-D Deficiency, Fractures)  Marijuana use  - counseled re: risks (asthma, not getting it legally), encouraged to quit (consider legal edibles rather than smoking)  Severe persistent asthma without complication (HCC) - much improved with use of Trelegy  Recent labs reviewed--normal TSH, HIV, c-met, A1c, CBC and iron  panel in 01/2023  Prednisone  20 mg Take 6 pills at once today Take 5 pills Tuesday Take 4 pills Wednesday Take 3 pills Thursday and Friday Take 2 pills Saturday, Sunday and Monday Take 1 pill Tuesday, Wed and Thursday.  Take the pills once daily with food, in the morning.  6/5/4/3/3/2/2/2/1/1/1 #30

## 2023-12-03 NOTE — Patient Instructions (Addendum)
 We are referring you to an allergist for further evaluation of your recurrent hives. Take the prednisone  course as directed (see below).  You may use the cetirizine if needed, along with it. Max should be 2 pills twice a day. OR take 2 of the fexofenadine in the morning, and 2 cetirizine at night.  Instructions for Prednisone  10 mg: Take 6 pills at once today (Monday 11/3) Take 5 pills Tuesday Take 4 pills Wednesday Take 3 pills Thursday and Friday Take 2 pills Saturday, Sunday and Monday Take 1 pill Tuesday, Wed and Thursday.  Take the pills once daily with food, in the morning.

## 2023-12-04 ENCOUNTER — Ambulatory Visit: Payer: Self-pay | Admitting: Family Medicine

## 2023-12-04 LAB — CBC WITH DIFFERENTIAL/PLATELET
Basophils Absolute: 0 x10E3/uL (ref 0.0–0.2)
Basos: 0 %
EOS (ABSOLUTE): 0.2 x10E3/uL (ref 0.0–0.4)
Eos: 3 %
Hematocrit: 41.3 % (ref 34.0–46.6)
Hemoglobin: 13.4 g/dL (ref 11.1–15.9)
Immature Grans (Abs): 0 x10E3/uL (ref 0.0–0.1)
Immature Granulocytes: 0 %
Lymphocytes Absolute: 2.1 x10E3/uL (ref 0.7–3.1)
Lymphs: 37 %
MCH: 30.7 pg (ref 26.6–33.0)
MCHC: 32.4 g/dL (ref 31.5–35.7)
MCV: 95 fL (ref 79–97)
Monocytes Absolute: 0.4 x10E3/uL (ref 0.1–0.9)
Monocytes: 7 %
Neutrophils Absolute: 3.1 x10E3/uL (ref 1.4–7.0)
Neutrophils: 53 %
Platelets: 238 x10E3/uL (ref 150–450)
RBC: 4.36 x10E6/uL (ref 3.77–5.28)
RDW: 12.9 % (ref 11.7–15.4)
WBC: 5.8 x10E3/uL (ref 3.4–10.8)

## 2023-12-04 LAB — HEPATIC FUNCTION PANEL
ALT: 10 IU/L (ref 0–32)
AST: 16 IU/L (ref 0–40)
Albumin: 4.1 g/dL (ref 3.9–4.9)
Alkaline Phosphatase: 47 IU/L (ref 41–116)
Bilirubin Total: 0.3 mg/dL (ref 0.0–1.2)
Bilirubin, Direct: 0.11 mg/dL (ref 0.00–0.40)
Total Protein: 6.7 g/dL (ref 6.0–8.5)

## 2023-12-04 LAB — ANTINUCLEAR ANTIBODIES, IFA

## 2023-12-04 LAB — C-REACTIVE PROTEIN: CRP: 9 mg/L (ref 0–10)

## 2023-12-04 LAB — VITAMIN D 25 HYDROXY (VIT D DEFICIENCY, FRACTURES): Vit D, 25-Hydroxy: 31.2 ng/mL (ref 30.0–100.0)

## 2023-12-04 LAB — COMPLEMENT, TOTAL: Compl, Total (CH50): >60 U/mL (ref >41)

## 2023-12-04 LAB — C3 COMPLEMENT: Complement C3, Serum: 192 mg/dL — AB (ref 82–167)

## 2023-12-04 LAB — SEDIMENTATION RATE: Sed Rate: 50 mm/h — AB (ref 0–32)

## 2023-12-04 LAB — C4 COMPLEMENT: Complement C4, Serum: 34 mg/dL (ref 12–38)

## 2023-12-31 ENCOUNTER — Encounter: Payer: Self-pay | Admitting: Oncology

## 2024-01-03 ENCOUNTER — Other Ambulatory Visit (HOSPITAL_BASED_OUTPATIENT_CLINIC_OR_DEPARTMENT_OTHER): Payer: Self-pay

## 2024-01-07 ENCOUNTER — Ambulatory Visit: Admitting: Internal Medicine

## 2024-01-29 ENCOUNTER — Other Ambulatory Visit (HOSPITAL_BASED_OUTPATIENT_CLINIC_OR_DEPARTMENT_OTHER): Payer: Self-pay

## 2024-01-29 ENCOUNTER — Other Ambulatory Visit: Payer: Self-pay | Admitting: Nurse Practitioner

## 2024-01-29 DIAGNOSIS — K5901 Slow transit constipation: Secondary | ICD-10-CM

## 2024-01-29 DIAGNOSIS — J4550 Severe persistent asthma, uncomplicated: Secondary | ICD-10-CM

## 2024-01-29 DIAGNOSIS — D508 Other iron deficiency anemias: Secondary | ICD-10-CM

## 2024-01-29 DIAGNOSIS — D61818 Other pancytopenia: Secondary | ICD-10-CM

## 2024-01-29 DIAGNOSIS — E539 Vitamin B deficiency, unspecified: Secondary | ICD-10-CM

## 2024-01-29 DIAGNOSIS — E538 Deficiency of other specified B group vitamins: Secondary | ICD-10-CM

## 2024-01-29 DIAGNOSIS — F3340 Major depressive disorder, recurrent, in remission, unspecified: Secondary | ICD-10-CM

## 2024-01-29 MED ORDER — PENICILLIN V POTASSIUM 500 MG PO TABS
500.0000 mg | ORAL_TABLET | Freq: Three times a day (TID) | ORAL | 0 refills | Status: DC
  Filled 2024-01-29: qty 21, 7d supply, fill #0

## 2024-01-29 MED ORDER — TRAMADOL HCL 50 MG PO TABS
50.0000 mg | ORAL_TABLET | Freq: Four times a day (QID) | ORAL | 0 refills | Status: DC | PRN
  Filled 2024-01-29: qty 15, 2d supply, fill #0

## 2024-01-30 ENCOUNTER — Encounter: Payer: Self-pay | Admitting: Oncology

## 2024-01-30 ENCOUNTER — Other Ambulatory Visit (HOSPITAL_BASED_OUTPATIENT_CLINIC_OR_DEPARTMENT_OTHER): Payer: Self-pay

## 2024-01-30 MED ORDER — LORATADINE 10 MG PO TABS
10.0000 mg | ORAL_TABLET | Freq: Every day | ORAL | 1 refills | Status: AC
  Filled 2024-01-30: qty 90, 90d supply, fill #0

## 2024-01-30 MED ORDER — TRELEGY ELLIPTA 200-62.5-25 MCG/ACT IN AEPB
1.0000 | INHALATION_SPRAY | Freq: Every day | RESPIRATORY_TRACT | 2 refills | Status: DC
  Filled 2024-01-30: qty 120, 60d supply, fill #0

## 2024-02-04 ENCOUNTER — Other Ambulatory Visit (HOSPITAL_BASED_OUTPATIENT_CLINIC_OR_DEPARTMENT_OTHER): Payer: Self-pay

## 2024-02-18 ENCOUNTER — Encounter: Payer: Self-pay | Admitting: Internal Medicine

## 2024-02-18 ENCOUNTER — Other Ambulatory Visit: Payer: Self-pay

## 2024-02-18 ENCOUNTER — Encounter: Payer: Self-pay | Admitting: Oncology

## 2024-02-18 ENCOUNTER — Ambulatory Visit: Admitting: Internal Medicine

## 2024-02-18 ENCOUNTER — Other Ambulatory Visit (HOSPITAL_BASED_OUTPATIENT_CLINIC_OR_DEPARTMENT_OTHER): Payer: Self-pay

## 2024-02-18 VITALS — BP 126/88 | HR 73 | Temp 98.3°F | Ht 63.0 in | Wt 213.0 lb

## 2024-02-18 DIAGNOSIS — L2082 Flexural eczema: Secondary | ICD-10-CM | POA: Diagnosis not present

## 2024-02-18 DIAGNOSIS — L508 Other urticaria: Secondary | ICD-10-CM | POA: Insufficient documentation

## 2024-02-18 DIAGNOSIS — T7800XA Anaphylactic reaction due to unspecified food, initial encounter: Secondary | ICD-10-CM | POA: Insufficient documentation

## 2024-02-18 DIAGNOSIS — J4550 Severe persistent asthma, uncomplicated: Secondary | ICD-10-CM | POA: Diagnosis not present

## 2024-02-18 DIAGNOSIS — R221 Localized swelling, mass and lump, neck: Secondary | ICD-10-CM | POA: Diagnosis not present

## 2024-02-18 DIAGNOSIS — J310 Chronic rhinitis: Secondary | ICD-10-CM

## 2024-02-18 MED ORDER — AIRSUPRA 90-80 MCG/ACT IN AERO
2.0000 | INHALATION_SPRAY | RESPIRATORY_TRACT | 1 refills | Status: AC | PRN
  Filled 2024-02-18: qty 10.7, 30d supply, fill #0

## 2024-02-18 MED ORDER — TRELEGY ELLIPTA 200-62.5-25 MCG/ACT IN AEPB
1.0000 | INHALATION_SPRAY | Freq: Every day | RESPIRATORY_TRACT | 5 refills | Status: AC
  Filled 2024-02-18: qty 1, 1d supply, fill #0

## 2024-02-18 MED ORDER — AZELASTINE HCL 0.1 % NA SOLN
2.0000 | Freq: Two times a day (BID) | NASAL | 5 refills | Status: AC | PRN
  Filled 2024-02-18: qty 30, 50d supply, fill #0

## 2024-02-18 MED ORDER — HYDROCORTISONE 2.5 % EX OINT
TOPICAL_OINTMENT | CUTANEOUS | 3 refills | Status: AC
  Filled 2024-02-18: qty 28.35, 30d supply, fill #0

## 2024-02-18 MED ORDER — MONTELUKAST SODIUM 10 MG PO TABS
10.0000 mg | ORAL_TABLET | Freq: Every day | ORAL | 5 refills | Status: AC
  Filled 2024-02-18: qty 30, 30d supply, fill #0

## 2024-02-18 MED ORDER — EPINEPHRINE 0.3 MG/0.3ML IJ SOAJ
0.3000 mg | INTRAMUSCULAR | 2 refills | Status: AC | PRN
  Filled 2024-02-18: qty 2, 28d supply, fill #0

## 2024-02-18 MED ORDER — TRIAMCINOLONE ACETONIDE 0.1 % EX OINT
TOPICAL_OINTMENT | CUTANEOUS | 1 refills | Status: AC
  Filled 2024-02-18: qty 15, 30d supply, fill #0

## 2024-02-18 MED ORDER — CETIRIZINE HCL 10 MG PO TABS
ORAL_TABLET | ORAL | 5 refills | Status: AC
  Filled 2024-02-18: qty 120, 30d supply, fill #0

## 2024-02-18 NOTE — Progress Notes (Signed)
 "  NEW PATIENT Date of Service/Encounter:   02/18/2024 Referring provider: Randol Dawes, MD Primary care provider: Oris Camie BRAVO, NP  Subjective:  Jill Summers is a 39 y.o. female with a PMHx of vitamin B12 deficiency with polyneuropathy, MDD, iron  deficiency anemia, obesity, thyromegaly presenting today for evaluation of recurrent urticaria and severe persistent asthma, food allergies.  History obtained from: chart review and patient.   Discussed the use of AI scribe software for clinical note transcription with the patient, who gave verbal consent to proceed.  History of Present Illness Jill Summers is a 39 year old female with severe asthma who presents with recurrent hives.  Urticaria (hives) - Recurrent episodes of hives severe enough to require emergency room visits for relief from pruritus. - Lesions appear on arms, thighs, and feet, described as resembling 'thousands of mosquito bites'. - Hives persist for days despite nightly Benadryl and Cetirizine  twice daily. - Prednisone  is effective in clearing hives; no hives since last emergency room visit on November  - History of hives dating back approximately 18 years, with episodes every few years. - No recent illness or vaccinations prior to latest episode. - occasionally associates swelling of lips etc but no other systemic symptoms  Asthma - Severe asthma since birth. - Last emergency room visit for asthma occurred about one to two years ago. - Required prednisone  two to three times in the past year for asthma exacerbations. - On Trelegy for the past two years, which has improved symptoms. - Continues to experience nighttime asthma symptoms five to seven times per month including wheezing and shortness of breath waking her up. - Uses albuterol  as needed, particularly for wheezing or shortness of breath triggered by allergies. - No specific triggers identified, including exercise, illness, or weather changes. Suspects  allergies  Food allergies - Allergies to shellfish, peanuts, fish, pistachios, which cause throat swelling and have required emergency room visits. - Able to tolerate almonds and sunflower seeds. - carries an epipen  that is out of date.  Atopic dermatitis (eczema) - History of eczema primarily affecting the neck and antecubital fossae. - Symptoms have improved over time. - Not currently using topical creams for eczema.  Allergic rhinitis and conjunctivitis - Perennial allergies causing rhinorrhea, ocular pruritus, and wheezing. - Uses Cetirizine  for allergies and Flonase nasal spray. - No recent allergy testing performed. - Does not use eye drops.  Eosinophilia - History of elevated eosinophils.  Environmental exposures - Exposed to marijuana smoke.     Chart Review:  Reviewed PCP notes from referral 12/03/23: generalized hives, no obvious triggers; on trelegy for asthma, tx: prednisone  and labs ER visit 09/28/23: started < 1 week prior, benadryl, zyrtec  and steroid taper not helping, no other systemic symptoms; Tx; prednisone   Labs:  12/03/23: elevated C3 (192), normal C4, normal CH50, normal vit D, normal liver enzymes, normal CBCd with AEC 200, elevated ESR 50, normal CRP, negative ANA titer 03/01/23: negative HIV, normal TSH, CBCd with AEC 400; otherwise normal  Imaging:  CXR 01/05/23: read as Cardiac silhouette and mediastinal contours are within limits. The lungs are clear. No pleural effusion pneumothorax. No acute skeletal abnormality. Normal   Past Medical History: Past Medical History:  Diagnosis Date   Adopted    Asthma    since baby   Bacterial vaginosis    Chlamydia    Depression    At age 86   MRSA (methicillin resistant staph aureus) culture positive 07/27/2013   MRSA infection  Obesity    Paraplegia (HCC) 03/01/2023   Urinary tract infection    Medication List:  Current Outpatient Medications  Medication Sig Dispense Refill   albuterol   (PROVENTIL ) (2.5 MG/3ML) 0.083% nebulizer solution Take 3 mLs (2.5 mg total) by nebulization every 4 (four) hours as needed for wheezing or shortness of breath. 150 mL 12   albuterol  (VENTOLIN  HFA) 108 (90 Base) MCG/ACT inhaler Inhale 1-2 puffs into the lungs every 6 (six) hours as needed for wheezing or shortness of breath 36 g 6   buPROPion  (WELLBUTRIN  XL) 150 MG 24 hr tablet Take 1 tablet (150 mg total) by mouth every morning. 90 tablet 3   cyanocobalamin  (VITAMIN B12) 1000 MCG/ML injection Inject 1 mL (1,000 mcg total) into the muscle every 30 (thirty) days. 1 mL 11   Fluticasone -Umeclidin-Vilant (TRELEGY ELLIPTA ) 200-62.5-25 MCG/ACT AEPB Inhale 1 puff into the lungs daily. Rinse after use. 120 each 2   loratadine  (CLARITIN ) 10 MG tablet Take 1 tablet (10 mg total) by mouth daily. 90 tablet 1   Vitamin D , Ergocalciferol , (DRISDOL ) 1.25 MG (50000 UNIT) CAPS capsule Take 1 capsule (50,000 Units total) by mouth every 7 (seven) days. 12 capsule 3   acetaminophen  (TYLENOL  8 HOUR) 650 MG CR tablet Take 1 tablet (650 mg total) by mouth every 8 (eight) hours as needed. 100 tablet 3   penicillin  v potassium (VEETID) 500 MG tablet Take 1 tablet (500 mg total) by mouth 3 (three) times daily. 21 tablet 0   traMADol  (ULTRAM ) 50 MG tablet Take 1-2 tablets (50-100 mg total) by mouth every 6 (six) hours as needed. 15 tablet 0   No current facility-administered medications for this visit.   Known Allergies:  Allergies[1] Past Surgical History: Past Surgical History:  Procedure Laterality Date   CESAREAN SECTION     CESAREAN SECTION WITH BILATERAL TUBAL LIGATION     Family History: Family History  Adopted: Yes   Social History: Jill Summers lives in a house without water damage, hardwood floors, gsa heating, central AC, indoor dogs, using DM covers on bed and pillows, exposed to smoke in home and car, uses recreational marijuana, +HEPA filter in the home, [place]   ROS:   All other systems negative except as noted per HPI.  Objective:  Blood pressure 126/88, pulse 73, temperature 98.3 F (36.8 C), temperature source Temporal, height 5' 3 (1.6 m), weight 213 lb (96.6 kg), SpO2 99%. Body mass index is 37.73 kg/m. Physical Exam:  General Appearance:  Alert, cooperative, no distress, appears stated age  Head:  Normocephalic, without obvious abnormality, atraumatic  Eyes:  Conjunctiva clear, EOM's intact  Ears EACs normal bilaterally and normal TMs bilaterally  Nose: Nares normal, hypertrophic turbinates, normal mucosa, and no visible anterior polyps  Throat: Lips, tongue normal; teeth and gums normal, normal posterior oropharynx  Neck: Supple, symmetrical  Lungs:   clear to auscultation bilaterally, Respirations unlabored, no coughing  Heart:  regular rate and rhythm and no murmur, Appears well perfused  Extremities: No edema  Skin: Skin color, texture, turgor normal and no rashes or lesions on visualized portions of skin  Neurologic: No gross deficits   Diagnostics: Spirometry:  Tracings reviewed. Her effort: Good reproducible efforts. FVC: 3.64L FEV1: 2.89L, 114% predicted  FEV1/FVC ratio: 0.79  Interpretation: Spirometry consistent with normal pattern.  Please see scanned spirometry results for details. Labs:  Lab Orders  No laboratory test(s) ordered today     Assessment and Plan  Assessment and Plan Assessment &  Plan Chronic urticaria-none currently Intermittent episodes of hives, primarily on arms, thighs, and feet, lasting days. Symptoms include itching and occasional lip swelling. Episodes have been unpredictable, with the last occurrence in November 2025, resolved with prednisone . No clear triggers identified. Discussed potential for autoimmune response causing hives. Injectable medications like Dupixent may address both hives and asthma. - this is defined as hives lasting more than 6 weeks without an identifiable trigger - hives can  be from a number of different sources including infections, allergies, vibration, temperature, pressure among many others other possible causes - often an identifiable cause is not determined - some potential triggers include: stress, illness, NSAIDs, aspirin, hormonal changes - you do not have any red flag symptoms to make us  concerned about secondary causes of hives, and recent labs looked great.  - at follow-up will obtain: IgE, BAT, alpha gal, thyroid  antibodies - approximately 50% of patients with chronic hives can have some associated swelling of the face/lips/eyelids (this is not a cause for alarm and does not typically progress onto systemic allergic reactions)  Therapy Plan:  - start zyrtec  (cetirizine ) 10mg  once daily - if hives are uncontrolled, increase zyrtec  (cetirizine ) to 10mg  twice daily - if hives remain uncontrolled, increase dose of zyrtec  (cetirizine ) to max dose of 20mg  (2 pills) twice daily- this is maximum dose - can increase or decrease dosing depending on symptom control to a maximum dose of 4 tablets of antihistamine daily. Wait until hives free for at least one month prior to decreasing dose.   - we are going to try to get dupixent approved for asthma which also treats hives  Can use one of the following in place of zyrtec  if desires: Claritin  (loratadine ) 10 mg, Xyzal (levocetirizine) 5 mg or Allegra (fexofenadine) 180 mg daily as needed   Severe persistent asthma-not well controlled Asthma since birth, with recent exacerbations requiring prednisone  2-3 times in the past year. Currently on Trelegy, which has been beneficial. Nighttime symptoms occur 5-7 times per month. Elevated eosinophils suggest eosinophilic asthma. Discussed potential for injectable medications like Dupixent to address both asthma and hives. Air Supra considered as a rescue inhaler with steroid component. - your lung testing today looked great - Controller Inhaler: Continue Trelegy 1 puff once a  day; This Should Be Used Everyday - Rinse mouth out after use - During respiratory illness or asthma flares: Start Airsupra  2 puffs twice daily  and every 4 hours as needed (max 12 puffs/day) continue for 1-2 weeks or until symptoms resolve. Use with Trelegy. - Rescue Inhaler: Airsupra  2 puffs. Use  every 4-6 hours as needed for chest tightness, wheezing, or coughing.   Can also use 15 minutes prior to exercise if you have symptoms with activity. - Asthma is not controlled if:  - Symptoms are occurring >2 times a week OR  - >2 times a month nighttime awakenings  - You are requiring systemic steroids (prednisone /steroid injections) more than once per year  - Your require hospitalization for your asthma.  - Please call the clinic to schedule a follow up if these symptoms arise Avoid smoke exposure Stay up-to-date with your annual flu vaccines, COVID vaccines and pneumonia vaccines when indicated. If Airsupra  not covered, will send albuterol . Will submit for dupixent for asthma given recurrent flares and ongoing nighttime symptoms despite trelegy inhaler   Food allergies (peanut, shellfish, fish, tree nuts) Allergies to shellfish, peanuts, fish, and certain tree nuts (cashews, pistachios). Previous reactions include throat swelling requiring emergency care. Almonds are  tolerated. Discussed cross-reactivity between tree nuts and the importance of avoiding non-tolerated nuts. - Avoid shellfish, peanuts, fish, cashews, and pistachios. - Continue to consume almonds and sesame seeds. - Will update allergy testing to confirm current status. - emergency action plan provided and reviewed - continue to carry epinephrine  in case of accidental exposure and reaction  Allergic rhinitis Year-round symptoms including runny nose, runny eyes, and wheezing. Symptoms may exacerbate asthma. Currently using cetirizine  and Flonase. Discussed potential use of azelastine  nasal spray. - Continue cetirizine  10 mg  daily and Flonase 1-2 sprays daily - Consider azelastine  nasal spray for additional symptom control. 2 sprays twice daily as needed - start montelukast  10 mg nightly-stop if nightmares or behavior changes. - Scheduled allergy testing to update current status.  Atopic dermatitis Eczema primarily on neck and bends of elbows. Symptoms have improved over time. No current topical treatment in use. Daily Care For Maintenance (daily and continue even once eczema controlled) - Use hypoallergenic hydrating ointment at least twice daily.  This must be done daily for control of flares. (Great options include Vaseline, CeraVe, Aquaphor, Aveeno, Cetaphil, VaniCream, etc) - Avoid detergents, soaps or lotions with fragrances/dyes - Limit showers/baths to 5 minutes and use luke warm water instead of hot, pat dry following baths, and apply moisturizer - can use steroid/non-steroid therapy creams as detailed below up to twice weekly for prevention of flares.  For Flares:(add this to maintenance therapy if needed for flares) First apply steroid/non-steroid treatment creams. Wait 5 minutes then apply moisturizer.  - Triamcinolone  0.1% to body for moderate flares-apply topically twice daily to red, raised areas of skin, followed by moisturizer. Do NOT use on face, groin or armpits. - Hydrocortisone  2.5% to face/body-apply topically twice daily to red, raised areas of skin, followed by moisturizer  Follow up : next Monday January 26th 1:30 PM (1-55, PN, [place], shellfish, fish) It was a pleasure meeting you in clinic today! Thank you for allowing me to participate in your care.  Rocky Endow, MD [facility]     Recording duration: 19 minutes      This note in its entirety was forwarded to the Provider who requested this consultation.  Other: samples provided of: vanicream  Thank you for your kind referral. I appreciate the opportunity to take part in Cayenne's care. Please do not  hesitate to contact me with questions.  Sincerely,  Rocky Endow, MD [facility]          [1]  Allergies Allergen Reactions   Peanut-Containing Drug Products Anaphylaxis, Hives and Swelling    *Throat swelling*   Shellfish Allergy Anaphylaxis, Hives and Swelling    Throat swelling   Aleve [Naproxen]    "

## 2024-02-18 NOTE — Patient Instructions (Addendum)
 Chronic urticaria Intermittent episodes of hives, primarily on arms, thighs, and feet, lasting days. Symptoms include itching and occasional lip swelling. Episodes have been unpredictable, with the last occurrence in November 2025, resolved with prednisone . No clear triggers identified.  Discussed potential for autoimmune response causing hives. Injectable medications like Dupixent may address both hives and asthma. - this is defined as hives lasting more than 6 weeks without an identifiable trigger - hives can be from a number of different sources including infections, allergies, vibration, temperature, pressure among many others other possible causes - often an identifiable cause is not determined - some potential triggers include: stress, illness, NSAIDs, aspirin, hormonal changes - you do not have any red flag symptoms to make us  concerned about secondary causes of hives, and recent labs looked great.  - at follow-up will obtain: IgE, BAT, alpha gal, thyroid  antibodies - approximately 50% of patients with chronic hives can have some associated swelling of the face/lips/eyelids (this is not a cause for alarm and does not typically progress onto systemic allergic reactions)  Therapy Plan:  - start zyrtec  (cetirizine ) 10mg  once daily - if hives are uncontrolled, increase zyrtec  (cetirizine ) to 10mg  twice daily - if hives remain uncontrolled, increase dose of zyrtec  (cetirizine ) to max dose of 20mg  (2 pills) twice daily- this is maximum dose - can increase or decrease dosing depending on symptom control to a maximum dose of 4 tablets of antihistamine daily. Wait until hives free for at least one month prior to decreasing dose.   - we are going to try to get dupixent approved for asthma which also treats hives  Can use one of the following in place of zyrtec  if desires: Claritin  (loratadine ) 10 mg, Xyzal (levocetirizine) 5 mg or Allegra (fexofenadine) 180 mg daily as needed   Severe persistent  asthma Asthma since birth, with recent exacerbations requiring prednisone  2-3 times in the past year. Currently on Trelegy, which has been beneficial. Nighttime symptoms occur 5-7 times per month. Elevated eosinophils suggest eosinophilic asthma. Discussed potential for injectable medications like Dupixent to address both asthma and hives. Air Supra considered as a rescue inhaler with steroid component. - your lung testing today looked great - Controller Inhaler: Continue Trelegy 1 puff once a day; This Should Be Used Everyday - Rinse mouth out after use - During respiratory illness or asthma flares: Start Airsupra  2 puffs twice daily  and every 4 hours as needed (max 12 puffs/day) continue for 1-2 weeks or until symptoms resolve. Use with Trelegy. - Rescue Inhaler: Airsupra  2 puffs. Use  every 4-6 hours as needed for chest tightness, wheezing, or coughing.   Can also use 15 minutes prior to exercise if you have symptoms with activity. - Asthma is not controlled if:  - Symptoms are occurring >2 times a week OR  - >2 times a month nighttime awakenings  - You are requiring systemic steroids (prednisone /steroid injections) more than once per year  - Your require hospitalization for your asthma.  - Please call the clinic to schedule a follow up if these symptoms arise Avoid smoke exposure Stay up-to-date with your annual flu vaccines, COVID vaccines and pneumonia vaccines when indicated. If Airsupra  not covered, will send albuterol . Will submit for dupixent for asthma given recurrent flares and ongoing nighttime symptoms despite trelegy inhaler   Food allergies (peanut, shellfish, fish, tree nuts) Allergies to shellfish, peanuts, fish, and certain tree nuts (cashews, pistachios). Previous reactions include throat swelling requiring emergency care. Almonds are tolerated. Discussed cross-reactivity between  tree nuts and the importance of avoiding non-tolerated nuts. - Avoid shellfish, peanuts, fish,  cashews, and pistachios. - Continue to consume almonds and sesame seeds. - Will update allergy testing to confirm current status. - emergency action plan provided and reviewed - continue to carry epinephrine  in case of accidental exposure and reaction  Allergic rhinitis Year-round symptoms including runny nose, runny eyes, and wheezing. Symptoms may exacerbate asthma. Currently using cetirizine  and Flonase. Discussed potential use of azelastine  nasal spray. - Continue cetirizine  10 mg daily and Flonase 1-2 sprays daily - Consider azelastine  nasal spray for additional symptom control. 2 sprays twice daily as needed - start montelukast  10 mg nightly-stop if nightmares or behavior changes. - Scheduled allergy testing to update current status.  Atopic dermatitis Eczema primarily on neck and bends of elbows. Symptoms have improved over time. No current topical treatment in use. Daily Care For Maintenance (daily and continue even once eczema controlled) - Use hypoallergenic hydrating ointment at least twice daily.  This must be done daily for control of flares. (Great options include Vaseline, CeraVe, Aquaphor, Aveeno, Cetaphil, VaniCream, etc) - Avoid detergents, soaps or lotions with fragrances/dyes - Limit showers/baths to 5 minutes and use luke warm water instead of hot, pat dry following baths, and apply moisturizer - can use steroid/non-steroid therapy creams as detailed below up to twice weekly for prevention of flares.  For Flares:(add this to maintenance therapy if needed for flares) First apply steroid/non-steroid treatment creams. Wait 5 minutes then apply moisturizer.  - Triamcinolone  0.1% to body for moderate flares-apply topically twice daily to red, raised areas of skin, followed by moisturizer. Do NOT use on face, groin or armpits. - Hydrocortisone  2.5% to face/body-apply topically twice daily to red, raised areas of skin, followed by moisturizer  Follow up : next Monday January  26th 1:30 PM (1-55, PN, [place], shellfish, fish) It was a pleasure meeting you in clinic today! Thank you for allowing me to participate in your care.  Rocky Endow, MD [facility]

## 2024-02-25 ENCOUNTER — Ambulatory Visit: Admitting: Internal Medicine

## 2024-02-28 ENCOUNTER — Encounter: Payer: Self-pay | Admitting: Oncology

## 2024-03-06 ENCOUNTER — Encounter: Payer: Medicare Other | Admitting: Nurse Practitioner

## 2024-03-06 NOTE — Assessment & Plan Note (Signed)
 SABRA

## 2024-03-06 NOTE — Assessment & Plan Note (Signed)
 Jill Summers

## 2024-03-06 NOTE — Progress Notes (Unsigned)
{  SEHM (Optional):34217} COVID:  Jill Doing, DNP, AGNP-c [facility]  [address] [place], [place] [address] [phone]   ESTABLISHED PATIENT- Chronic Health and/or Follow-Up Visit on 03/06/2024  There were no vitals taken for this visit.   Subjective:  No chief complaint on file.   *** ROS negative except for what is listed in HPI. History, Medications, Surgery, SDOH, and Family History reviewed and updated as appropriate.  Objective:  Physical Exam      Assessment & Plan:   Assessment & Plan Severe persistent asthma without complication (HCC)     Obesity, morbid (HCC)     Pancytopenia (HCC)     Vitamin B 12 deficiency     Major depressive disorder, recurrent, in remission     Other iron  deficiency anemia     Vitamin D  deficiency     Screening for endocrine, nutritional, metabolic and immunity disorder       Jill FORBES Doing, DNP, AGNP-c  {SETIMEYorN (Optional):34216}
